# Patient Record
Sex: Male | Born: 1950 | Race: White | Hispanic: No | Smoking: Former smoker
Health system: Southern US, Community
[De-identification: ages and names within clinical notes are randomized; demographics above are authoritative.]

## PROBLEM LIST (undated history)

## (undated) DIAGNOSIS — F319 Bipolar disorder, unspecified: Secondary | ICD-10-CM

## (undated) DIAGNOSIS — I480 Paroxysmal atrial fibrillation: Secondary | ICD-10-CM

## (undated) DIAGNOSIS — Z973 Presence of spectacles and contact lenses: Secondary | ICD-10-CM

## (undated) DIAGNOSIS — H9190 Unspecified hearing loss, unspecified ear: Secondary | ICD-10-CM

## (undated) DIAGNOSIS — E1140 Type 2 diabetes mellitus with diabetic neuropathy, unspecified: Secondary | ICD-10-CM

## (undated) DIAGNOSIS — E119 Type 2 diabetes mellitus without complications: Secondary | ICD-10-CM

## (undated) DIAGNOSIS — E785 Hyperlipidemia, unspecified: Secondary | ICD-10-CM

## (undated) DIAGNOSIS — I4892 Unspecified atrial flutter: Secondary | ICD-10-CM

## (undated) DIAGNOSIS — N400 Enlarged prostate without lower urinary tract symptoms: Secondary | ICD-10-CM

## (undated) DIAGNOSIS — I4891 Unspecified atrial fibrillation: Secondary | ICD-10-CM

## (undated) DIAGNOSIS — I499 Cardiac arrhythmia, unspecified: Secondary | ICD-10-CM

## (undated) DIAGNOSIS — I272 Pulmonary hypertension, unspecified: Secondary | ICD-10-CM

## (undated) DIAGNOSIS — R011 Cardiac murmur, unspecified: Secondary | ICD-10-CM

## (undated) DIAGNOSIS — T8859XA Other complications of anesthesia, initial encounter: Secondary | ICD-10-CM

## (undated) DIAGNOSIS — N289 Disorder of kidney and ureter, unspecified: Secondary | ICD-10-CM

## (undated) DIAGNOSIS — I10 Essential (primary) hypertension: Secondary | ICD-10-CM

## (undated) DIAGNOSIS — M1990 Unspecified osteoarthritis, unspecified site: Secondary | ICD-10-CM

## (undated) DIAGNOSIS — M109 Gout, unspecified: Secondary | ICD-10-CM

## (undated) HISTORY — DX: Type 2 diabetes mellitus with diabetic neuropathy, unspecified: E11.40

## (undated) HISTORY — DX: Type 2 diabetes mellitus without complications: E11.9

## (undated) HISTORY — DX: Cardiac murmur, unspecified: R01.1

## (undated) HISTORY — DX: Disorder of kidney and ureter, unspecified: N28.9

## (undated) HISTORY — DX: Unspecified atrial fibrillation: I48.91

## (undated) HISTORY — PX: CARDIAC ABLATION: SHX51081

## (undated) HISTORY — DX: Unspecified hearing loss, unspecified ear: H91.90

## (undated) HISTORY — PX: APPENDECTOMY (OPEN): SHX54

## (undated) HISTORY — PX: HERNIA REPAIR: SHX51

## (undated) HISTORY — DX: Other complications of anesthesia, initial encounter: T88.59XA

## (undated) HISTORY — DX: Benign prostatic hyperplasia without lower urinary tract symptoms: N40.0

## (undated) HISTORY — DX: Essential (primary) hypertension: I10

## (undated) HISTORY — DX: Unspecified osteoarthritis, unspecified site: M19.90

## (undated) HISTORY — PX: TEE: SHX5571

## (undated) HISTORY — DX: Unspecified atrial flutter: I48.92

## (undated) HISTORY — DX: Bipolar disorder, unspecified: F31.9

## (undated) HISTORY — DX: Paroxysmal atrial fibrillation: I48.0

## (undated) HISTORY — PX: CARDIAC CATHETERIZATION: SHX172

## (undated) HISTORY — DX: Cardiac arrhythmia, unspecified: I49.9

## (undated) HISTORY — DX: Pulmonary hypertension, unspecified: I27.20

## (undated) HISTORY — DX: Hyperlipidemia, unspecified: E78.5

## (undated) HISTORY — PX: TONSILLECTOMY, ADENOIDECTOMY: SHX5620

## (undated) HISTORY — PX: CARDIOVERSION: SHX1299

## (undated) HISTORY — PX: TONSILLECTOMY: SUR1361

## (undated) HISTORY — DX: Presence of spectacles and contact lenses: Z97.3

## (undated) HISTORY — DX: Gout, unspecified: M10.9

---

## 1988-04-06 ENCOUNTER — Ambulatory Visit: Admit: 1988-04-06 | Payer: Self-pay | Source: Ambulatory Visit

## 1988-07-18 ENCOUNTER — Emergency Department: Admit: 1988-07-18 | Payer: Self-pay | Source: Ambulatory Visit

## 1988-10-04 ENCOUNTER — Inpatient Hospital Stay
Admission: AD | Admit: 1988-10-04 | Disposition: A | Discharge status: Home / Self Care | Payer: Self-pay | Source: Ambulatory Visit

## 1988-10-15 ENCOUNTER — Emergency Department: Admit: 1988-10-15 | Payer: Self-pay | Source: Ambulatory Visit

## 2009-08-09 HISTORY — PX: PARACENTESIS: SHX844

## 2015-01-13 ENCOUNTER — Encounter
Admission: RE | Disposition: A | Discharge status: Home / Self Care | Payer: Self-pay | Source: Home / Self Care | Attending: Clinical Cardiac Electrophysiology

## 2015-01-13 ENCOUNTER — Ambulatory Visit
Admission: RE | Admit: 2015-01-13 | Discharge: 2015-01-13 | Disposition: A | Discharge status: Home / Self Care | Payer: Medicare Other | Attending: Clinical Cardiac Electrophysiology | Admitting: Clinical Cardiac Electrophysiology

## 2015-01-13 ENCOUNTER — Ambulatory Visit: Payer: Medicare Other | Admitting: Clinical Cardiac Electrophysiology

## 2015-01-13 DIAGNOSIS — I10 Essential (primary) hypertension: Secondary | ICD-10-CM | POA: Insufficient documentation

## 2015-01-13 DIAGNOSIS — E1140 Type 2 diabetes mellitus with diabetic neuropathy, unspecified: Secondary | ICD-10-CM | POA: Insufficient documentation

## 2015-01-13 DIAGNOSIS — Z7901 Long term (current) use of anticoagulants: Secondary | ICD-10-CM | POA: Insufficient documentation

## 2015-01-13 DIAGNOSIS — Z885 Allergy status to narcotic agent status: Secondary | ICD-10-CM | POA: Insufficient documentation

## 2015-01-13 DIAGNOSIS — Z87891 Personal history of nicotine dependence: Secondary | ICD-10-CM | POA: Insufficient documentation

## 2015-01-13 DIAGNOSIS — I480 Paroxysmal atrial fibrillation: Secondary | ICD-10-CM | POA: Insufficient documentation

## 2015-01-13 DIAGNOSIS — I4892 Unspecified atrial flutter: Secondary | ICD-10-CM | POA: Insufficient documentation

## 2015-01-13 LAB — VH I-STAT CHEM 8 NOTIFICATION

## 2015-01-13 LAB — CBC AND DIFFERENTIAL
Basophils %: 0.9 % (ref 0.0–3.0)
Basophils Absolute: 0.1 10*3/uL (ref 0.0–0.3)
Eosinophils %: 5.1 % (ref 0.0–7.0)
Eosinophils Absolute: 0.3 10*3/uL (ref 0.0–0.8)
Hematocrit: 43.5 % (ref 39.0–52.5)
Hemoglobin: 14.2 gm/dL (ref 13.0–17.5)
Lymphocytes Absolute: 1.3 10*3/uL (ref 0.6–5.1)
Lymphocytes: 18.9 % (ref 15.0–46.0)
MCH: 29 pg (ref 28–35)
MCHC: 33 gm/dL (ref 32–36)
MCV: 89 fL (ref 80–100)
MPV: 7 fL (ref 6.0–10.0)
Monocytes Absolute: 0.9 10*3/uL (ref 0.1–1.7)
Monocytes: 13.8 % (ref 3.0–15.0)
Neutrophils %: 61.3 % (ref 42.0–78.0)
Neutrophils Absolute: 4.1 10*3/uL (ref 1.7–8.6)
PLT CT: 287 10*3/uL (ref 130–440)
RBC: 4.88 10*6/uL (ref 4.00–5.70)
RDW: 14.6 % — ABNORMAL HIGH (ref 11.0–14.0)
WBC: 6.8 10*3/uL (ref 4.0–11.0)

## 2015-01-13 LAB — ECG 12-LEAD
Patient Age: 63 years
Q-T Dispersion: 60 ms
Q-T Interval(Corrected): 503 ms
Q-T Interval: 364 ms
QRS Axis: 26 deg
QRS Duration: 104 ms
T Axis: 51 deg
Ventricular Rate: 115 /min

## 2015-01-13 LAB — I-STAT CHEM 8 CARTRIDGE
Anion Gap I-Stat: 16 (ref 7–16)
BUN I-Stat: 17 mg/dL (ref 6–20)
Calcium Ionized I-Stat: 4.8 mg/dL (ref 4.35–5.10)
Chloride I-Stat: 105 mMol/L (ref 98–112)
Creatinine I-Stat: 1.1 mg/dL (ref 0.90–1.30)
EGFR: >60 mL/min/{1.73_m2}
Glucose I-Stat: 140 mg/dL — ABNORMAL HIGH (ref 70–99)
Hematocrit I-Stat: 43 % (ref 39.0–52.5)
Hemoglobin I-Stat: 14.6 gm/dL (ref 13.0–17.5)
Potassium I-Stat: 4.3 mMol/L (ref 3.5–5.3)
Sodium I-Stat: 141 mMol/L (ref 135–145)
TCO2 I-Stat: 25 mMol/L (ref 24–29)

## 2015-01-13 LAB — VH I-STAT INR NOTIFICATION

## 2015-01-13 LAB — VH I-STAT INR: i-STAT INR: 1.1 (ref 0.0–3.0)

## 2015-01-13 SURGERY — ABLATION - SVT

## 2015-01-13 MED ORDER — VH HEPARIN (PORCINE) IN NACL 2-0.9 UNIT/ML-%IJ SOLN (WRAP)
Status: AC
Start: 2015-01-13 — End: 2015-01-13
  Filled 2015-01-13: qty 500

## 2015-01-13 MED ORDER — ONDANSETRON HCL 4 MG/2ML IJ SOLN
INTRAMUSCULAR | Status: DC
Start: 2015-01-13 — End: 2015-01-13
  Filled 2015-01-13: qty 2

## 2015-01-13 MED ORDER — FENTANYL CITRATE (PF) 50 MCG/ML IJ SOLN (WRAP)
INTRAMUSCULAR | Status: DC
Start: 2015-01-13 — End: 2015-01-13
  Filled 2015-01-13: qty 2

## 2015-01-13 MED ORDER — MIDAZOLAM HCL 2 MG/2ML IJ SOLN
INTRAMUSCULAR | Status: DC
Start: 2015-01-13 — End: 2015-01-13
  Filled 2015-01-13: qty 2

## 2015-01-13 MED ORDER — SODIUM CHLORIDE 0.9 % IV SOLN
1.0000 ug/min | Freq: Once | INTRAVENOUS | Status: DC
Start: 2015-01-13 — End: 2015-01-13
  Filled 2015-01-13: qty 5

## 2015-01-13 MED ORDER — FENTANYL CITRATE (PF) 50 MCG/ML IJ SOLN (WRAP)
INTRAMUSCULAR | Status: AC
Start: 2015-01-13 — End: 2015-01-13
  Administered 2015-01-13 (×2): 50 ug via INTRAVENOUS
  Filled 2015-01-13: qty 2

## 2015-01-13 MED ORDER — LIDOCAINE HCL (CARDIAC) 20 MG/ML IV SOLN
INTRAVENOUS | Status: AC
Start: 2015-01-13 — End: 2015-01-13
  Administered 2015-01-13: 20 mL via SUBCUTANEOUS
  Filled 2015-01-13: qty 10

## 2015-01-13 MED ORDER — MIDAZOLAM HCL 2 MG/2ML IJ SOLN
INTRAMUSCULAR | Status: AC
Start: 2015-01-13 — End: 2015-01-13
  Administered 2015-01-13 (×2): 1 mg via INTRAVENOUS
  Filled 2015-01-13: qty 2

## 2015-01-13 MED ORDER — MIDAZOLAM HCL 2 MG/2ML IJ SOLN
INTRAMUSCULAR | Status: AC
Start: 2015-01-13 — End: 2015-01-13
  Administered 2015-01-13: 2 mg via INTRAVENOUS
  Filled 2015-01-13: qty 2

## 2015-01-13 NOTE — Progress Notes (Signed)
Ate lunch and tolerated well. Ambulated. Voided. Groin remains stable.

## 2015-01-13 NOTE — Progress Notes (Signed)
Discharge inst given regarding medications, groin care and follow up. Pt verbalized understanding. Discharged home accompanied by friend.

## 2015-01-13 NOTE — Discharge Instructions (Signed)
EP Study/Ablation: Bleeding Or Hematoma After   You recently had an EP study and atrial flutter ablation. A catheter was put into your body through a puncture site in your groin To prevent repeat bleeding, take some precautions at home. If the bleeding starts again, follow the advice below.    Home Care  For the rest of the day:  Drink additional fluids unless told otherwise  Keep dressing dry and intact.  Do not shower.  Do not drive.    For The Next 72 Hours:   May remove dressing the day following your procedure. Do not apply any powders, lotions or ointments and keep bandage off.  Do not do any strenuous activity including lifting, pushing or pulling anything greater than 10 lbs.  You may shower but do NOT sit in tub water, hot tub or pool of water.  You may have some flat bruising and/or mild soreness at the puncture site--this is normal.  Notify your physician if you experience chills or fever greater than 101.    If Bleeding Occurs:  Lie on your back.  Apply firm pressure at the site with your hand.  Call 911.    Get Prompt Medical Attention  if any of the following occurs:  Increased pain, redness, swelling, or drainage from the puncture site  Coolness, numbness, tingling, or skin color changes in the leg or arm that had the puncture site  Chest pain or pressure  Nausea or vomiting  Feeling weak or faint  Hematoma (lump) is constantly or quickly getting larger   [address] [place] [address]. All rights reserved. This information is not intended as a substitute for professional medical care. Always follow your healthcare professional's instructions.

## 2015-01-13 NOTE — Progress Notes (Signed)
Awake. Groin remains stable.

## 2015-01-13 NOTE — Progress Notes (Signed)
Returned from EP lab s/p atrial flutter ablation. Drowsy but awakens easily. Alert and oriented. Right groin stable. Activity restrictions given.

## 2015-01-13 NOTE — Discharge Summary (Signed)
Patient with persistent flutter underwent successful flutter ablation. No complications. Will d/c atenolol. Continue Sotalol and Eliquis. F/u in few weeks.

## 2016-03-05 DIAGNOSIS — I517 Cardiomegaly: Secondary | ICD-10-CM

## 2016-03-05 DIAGNOSIS — I509 Heart failure, unspecified: Secondary | ICD-10-CM

## 2016-03-16 ENCOUNTER — Ambulatory Visit
Admission: RE | Admit: 2016-03-16 | Discharge: 2016-03-16 | Disposition: A | Discharge status: Home / Self Care | Payer: Medicare Other | Source: Ambulatory Visit

## 2016-03-16 DIAGNOSIS — I509 Heart failure, unspecified: Secondary | ICD-10-CM

## 2016-03-16 DIAGNOSIS — I350 Nonrheumatic aortic (valve) stenosis: Secondary | ICD-10-CM

## 2016-03-16 DIAGNOSIS — I517 Cardiomegaly: Secondary | ICD-10-CM | POA: Insufficient documentation

## 2016-03-16 LAB — I-STAT CREATININE
Creatinine I-Stat: 1.3 mg/dL (ref 0.90–1.30)
EGFR: 57 mL/min/{1.73_m2} — ABNORMAL LOW (ref 60–150)

## 2016-03-16 MED ORDER — GADOBUTROL 1 MMOL/ML IV SOLN
15.0000 mL | Freq: Once | INTRAVENOUS | Status: AC | PRN
Start: 2016-03-16 — End: 2016-03-16
  Administered 2016-03-16: 15 mmol via INTRAVENOUS

## 2016-05-20 ENCOUNTER — Encounter: Payer: Self-pay | Admitting: Internal Medicine

## 2016-05-26 ENCOUNTER — Other Ambulatory Visit (INDEPENDENT_AMBULATORY_CARE_PROVIDER_SITE_OTHER): Payer: Self-pay | Admitting: Internal Medicine

## 2016-05-26 DIAGNOSIS — I4891 Unspecified atrial fibrillation: Secondary | ICD-10-CM

## 2016-06-04 ENCOUNTER — Ambulatory Visit
Admission: RE | Admit: 2016-06-04 | Discharge: 2016-06-04 | Disposition: A | Discharge status: Home / Self Care | Payer: Medicare Other | Source: Ambulatory Visit | Attending: Internal Medicine | Admitting: Internal Medicine

## 2016-06-04 DIAGNOSIS — I4891 Unspecified atrial fibrillation: Secondary | ICD-10-CM | POA: Insufficient documentation

## 2016-06-04 LAB — POTASSIUM: Potassium: 4 mMol/L (ref 3.5–5.3)

## 2016-06-04 LAB — MAGNESIUM: Magnesium: 2.2 mg/dL (ref 1.6–2.6)

## 2016-06-04 MED ORDER — BENZOCAINE 20% MT SOLN (WRAP)
OROMUCOSAL | Status: AC
Start: 2016-06-04 — End: ?
  Filled 2016-06-04: qty 0.28

## 2016-06-04 MED ORDER — BENZOCAINE 20% MT SOLN (WRAP)
OROMUCOSAL | Status: AC | PRN
Start: 2016-06-04 — End: 2016-06-04
  Administered 2016-06-04: 1 via OROMUCOSAL

## 2016-06-04 MED ORDER — MIDAZOLAM HCL 2 MG/2ML IJ SOLN
INTRAMUSCULAR | Status: AC | PRN
Start: 2016-06-04 — End: 2016-06-04
  Administered 2016-06-04: 1 mg via INTRAVENOUS

## 2016-06-04 MED ORDER — LIDOCAINE VISCOUS 2 % MT SOLN
OROMUCOSAL | Status: AC
Start: 2016-06-04 — End: ?
  Filled 2016-06-04: qty 15

## 2016-06-04 MED ORDER — MIDAZOLAM HCL 2 MG/2ML IJ SOLN
INTRAMUSCULAR | Status: AC
Start: 2016-06-04 — End: ?
  Filled 2016-06-04: qty 4

## 2016-06-04 MED ORDER — VH FENTANYL CITRATE 100 MCG/2 ML (NARRATOR)
INTRAMUSCULAR | Status: AC | PRN
Start: 2016-06-04 — End: 2016-06-04
  Administered 2016-06-04: 50 ug via INTRAVENOUS

## 2016-06-04 MED ORDER — MIDAZOLAM HCL 2 MG/2ML IJ SOLN
INTRAMUSCULAR | Status: AC | PRN
Start: 2016-06-04 — End: 2016-06-04
  Administered 2016-06-04: 2 mg via INTRAVENOUS

## 2016-06-04 MED ORDER — FENTANYL CITRATE (PF) 50 MCG/ML IJ SOLN (WRAP)
INTRAMUSCULAR | Status: AC
Start: 2016-06-04 — End: ?
  Filled 2016-06-04: qty 2

## 2016-06-04 NOTE — Sedation Documentation (Signed)
Family updated as to patient's status.

## 2016-06-04 NOTE — Sedation Documentation (Signed)
200 Joule BP SYNC CV, SR

## 2016-06-04 NOTE — Discharge Instructions (Signed)
DO NOT DRIVE OR OPERATE DANGEROUS EQUIPMENT BEFORE TOMORROW.   DO NOT ENGAGE IN ANY CONTRACTS BEFORE TOMORROW.  SAME HOME MEDICATIONS EXCEPT DISCONTINUE ATENOLOL PER DR. Jaquita Rector.

## 2016-06-04 NOTE — Sedation Documentation (Signed)
TEE END.

## 2016-06-04 NOTE — Sedation Documentation (Signed)
Reviewed procedures with patient, he says he understands. Negative STOP BANG.

## 2016-06-04 NOTE — Sedation Documentation (Signed)
Patient experienced no respiratory distress during procedure.

## 2016-06-04 NOTE — Sedation Documentation (Signed)
Patient numbed pre TEE with Viscous Lidocaine and Hurricaine One.

## 2016-06-04 NOTE — Sedation Documentation (Signed)
Patient tolerated procedure well.

## 2016-06-04 NOTE — Procedures (Signed)
Direct Current Cardioversion  Procedure Note    Patient Name: ABHIRAM CRIADO    Date of Birth: 11-12-1950    Referring Physician:  Dr [name]     Indication: Atrial Fibrillation     TEE done prior : no thrombus seen , + PFO + bicuspid aortic valve     Pre-procedural assessment:   ASA :2  Mallampati: 2    Moderate conscious Sedation:  Moderate conscious sedation was performed. A preprocedural assessment of the patient was completed including review of past medical/surgical history,previous anesthesia sedation experience and family history. Patient's medications and drug allergies were reviewed. Patient examined with vital signs reviewed.  Incremental amounts of medications given to attain moderate level of consciousness.There was continuous face-to-face attendance by myself with trained nurse  monitoring patient's consciousness and physiologic status throughout the procedure.     My total intraservice face-to-face time for moderate sedation:15 Minutes.    Medications: Patient anesthetized with Versed 4  mg, Fentanyl  100 mcg.    Procedure:  Informed consent is obtained. A 200 joule anterior posterior synchronized biphasic shock delivered with successful cardioversion to sinus rhythm.    Complications: None    Pete Glatter, MD

## 2016-06-07 LAB — ECG 12-LEAD
P Wave Axis: 93 deg
P-R Interval: 203 ms
Patient Age: 64 years
Q-T Interval(Corrected): 491 ms
Q-T Interval: 468 ms
QRS Axis: 57 deg
QRS Duration: 93 ms
T Axis: 103 years
Ventricular Rate: 66 //min

## 2016-06-17 ENCOUNTER — Encounter: Payer: Self-pay | Admitting: Medical

## 2016-06-23 ENCOUNTER — Encounter: Payer: Self-pay | Admitting: Medical

## 2016-08-27 ENCOUNTER — Telehealth: Payer: Self-pay

## 2016-08-27 NOTE — Telephone Encounter (Signed)
Pt called in and stated that he has been in constant AFIB for some time. He states that over the past week it has gotten worse, HR around 100bpm. Pt has an upcoming appt 2/9 but would like something sooner with Dr. [name]. I offered 1/25 with Sharol Roussel PAC but pt declined. He asked that I send a message. The only sxs he has c/o is DOE. He will go to ER if sxs persist or worsen. Pt would like to know if Dr. [name] can work him in sooner    Please advise    Thanks  Ahill LPN

## 2016-08-30 NOTE — Telephone Encounter (Signed)
Tried to call pt, no answer, VM left stating that 2/9 is the soonest    Ahill LPN

## 2016-08-30 NOTE — Telephone Encounter (Signed)
He is overbooked his best option is 2/9 with Lb, please just re-encouraged this is the soonest option and ADV encouraged him to go see her.   Thanks   Janett Billow

## 2016-09-21 ENCOUNTER — Telehealth: Payer: Self-pay

## 2016-09-21 NOTE — Progress Notes (Signed)
.D: 06/23/16 : 11:35am  .T: Cardiology Follow-up    [facility]  [address], [place], [place]  Phone: [phone] * Fax: [phone]  [email or web address]    Patient: [name]  Date of Birth: June 15, 2051  Gender:  male   Cardiologist: Dr. Bronson [name]  Primary Care Provider:  Vergie [name]  Patient Care was overseen, and co-signed, by the following:  .CC: ADV  [name] Curb, DO   CC:   Dr. Jerene [name]    Chief Complaint/Reason for Consult: Establish Care/AFib     Medical History:     PAST MEDICAL HISTORY:    1.  Atrial fibrillation, new onset, 10/21/11, cardioverted and placed on sotalol.      a.  Cardioversion 10/22/11.       b.  Echo 10/22/11- moderate-severe concentric LVH. Biatrial enlargement. Mild AS, dilated inferior vena cava, small pericardial effusion. Peak velocity across the AV at 2.2  meters/s.      c.  Cardiac abltation 01/13/15.       d.  MRI Cardiac 03/16/16- LVEF 68.4%. Moderate septal wall hypertrophy (1.6cm) and mild posterior wall hypertrophy (1.3cm). Focal area of mid-myocardial wall delayed enhancement in basal anteroseptal wall and RV insertion point consistent with hypertensive heart disease. RVEF 41.7%. Severe LA enlargement and moderate RA enlargement. 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 Mild AS with peak velocity 2.49m/s. Mild central AR. Mild MR and TR. Small to moderate pericardial effusion. Mildly dilated ascending aorta. Aortic arch and descending thoracic aorta are ectatic. Trivial right pleural effusion. Dilated central pulm artery at 4.3cm.        e.  s/p TEE and [place] cardioversion 06/04/16    2.  Hypertension.  3.  Hyperlipidemia.   4.  Type I Diabetes Mellitus.  5.  Bipolar disorder.  6.  Fatty liver.  7.  Renal insufficiency.      a.  Normal renal ultrasound October 23, 2011.    Past Surgical History:   1.  Adenoidectomy.   2.  Appendectomy.   3.   Hernia Repair.   4.  Tonsillectomy.     LABS: 03/16/16- Creat 1.3, GFR 57    Most Recent Ejection Fraction: 70 % by Echo on  02/25/2016     .end  History of Present Illness:      66 year old gentleman here for follow up of his atrial fibrillation.  He was seen in consultation by Dr. Sheppard [name] October 12th.  H/O of atrial fibrillation dating back to 2013.  He was in atrial fibrillation at the time of his last visit.  Dr. Sheppard [name] started him on amiodarone and arranged for TEE and cardioversion.  Cardioversion was initially successful.  This was performed on October 27th.  By November 2nd he felt that he went back into AFib.  On 11/3 he went back into sinus rhythm which continued until November 6th.  unfortunately, went back into AFib after that and it has been persistent since.  Also note that his atenolol was discontinued at the time of his cardioversion.  Within a few days, he noted increased palpitations/ PVCs /elevated blood pressures, so he took it upon himself to restart it.    Review of Systems: As per HPI, all other systems negative.  See Cardio Intake form, that has been scanned into the chart.    Allergies:   Allergies:  CODEINE  Allergy history last updated on 05/20/16  Allergies Reviewed  .ALR    Current Medications:     Current Medications:  Rx: METFORMIN HCL 1000MG  1 Tablet twice daily   Rx: MINOXIDIL 10mg  3 Tablet daily   Rx: DEPAKOTE 250MG  2 Tablet twice daily   Rx: ALLOPURINOL 300MG  1 Tablet daily   Rx: DILTIAZEM HCL ER BEADS 240MG  1 Capsule ER 24HR daily   Rx: FLOMAX 0.4MG  1 Capsule daily   Rx: GABAPENTIN 100MG  1 Capsule twice daily   Rx: GLIMEPIRIDE 1MG  0.5 Tablet daily   Rx: LASIX 80MG  1 Tablet daily   Rx: PRAVASTATIN SODIUM 40MG  1 Tablet daily   Rx: XARELTO 20MG  1 Tablet daily   Rx: ULTRAM 50MG  1 Tablet twice daily PRN     Atenolol 100 mg daily.    Patient presented Rx by: Bottles/List  nd    Family History (FHx)     Family History:     Mother      Hypertensive disorder      Diabetes 2  Sister       Heart disease      Diabetes  Brother      Pulmonary embolism      Hypertensive disorder      Heart disease    .end  Social History (SHx)     Alcohol Consumption: None   Caffeine Intake: 1 cup coffee daily  Exercise: Walking/Daily  z.end    Smoking Status is recorded today as Former smoker     Physical Exam:  Vital Signs:  Bp: 134/84, Left Arm, Pulse: 60, Irregular  Height: 5'7", Weight: 182 lbs    BMI: 28.52 kg/m2  BMI: BMI of 28.52 kg/m2 reflects normal weight for age 10 and above.  No BMI plan necessary.      General:  Well appearing, well nourished in no distress.     Head:  normocephalic, atraumatic     Lungs:  CTA bilaterally, no wheezes, rhonchi, rales.  Breathing unlabored.    Neck: Supple,   Heart:   Irregularly irregular with a 2/6 systolic ejection murmur .  controlled rate.    Extremities:  No deformities, clubbing, cyanosis, or edema.   Skin:  no rash or prominent lesions    Neurologic:  A/O x 3.  Gait Normal   Psychiatric:  Normal affect.     EKG: (personally reviewed by me)   atrial fibrillation, rate 87 beats per min.    Impressions/Recommendations:   1.   Recurrent, persistent atrial fibrillation.  Controlled rate.  He is still on amiodarone 200 mg daily and atenolol and diltizem for rate control.  Dr. Sheppard [name] was concerned that atrial fib ablation would not likely be successful given the presence of atrial fibrillation.  At this point,  since he has been going in and out of the atrial fibrillation, will hold tight and see if he does convert again.  unclear for would be worthwhile to cardiovert him again or increase the amiodarone.  I will discuss further with Dr. Sheppard [name] and get his input.  In the meantime, Mr. [name] will keep his f/u as scheduled with Dr. Sheppard [name] in Feb.  and contcat [place] in the interim with any changes.    The patient was given the following instructions during the current visit:  Please take your medications as instructed and call if you have any side effects. Don't  forget to bring your meds to the next visit.        #        SIGNED BY  LISA BEHNEKE, PA-C  ([id number])       06/23/2016 11:36AM  #       REVISED BY Sharol Roussel, PA-C  (BL3)       06/23/2016 06:12PM  #      REVIEWED BY [name], DO  (ADV)       06/28/2016 06:48AM

## 2016-09-21 NOTE — Telephone Encounter (Signed)
PCP requested a fax for request of records recent office note and labs, Requested.

## 2016-09-21 NOTE — Progress Notes (Signed)
.D: 05/20/16 : 11:16am  .T: Cardiology     [facility]  [address], [place], [place]  Phone: [phone] * Fax: [phone]  [email or web address]    Patient: [name]  Date of Birth: Feb 06, 2051  Gender:  male   Cardiologist: Magdalene Molly  Primary Care Provider:  Vergie Living    Chief Complaint/Reason for Consult: Establish Care/AFib     Medical History:     PAST MEDICAL HISTORY:    1.  Atrial fibrillation, new onset, 10/21/11, cardioverted and placed on sotalol.      a.  Cardioversion 10/22/11.       b.  Echo 10/22/11- moderate-severe concentric LVH. Biatrial enlargement. Mild AS, dilated inferior vena cava, small pericardial effusion. Peak velocity across the AV at 2.2  meters/s.      c.  Cardiac abltation 01/13/15.       d.  MRI Cardiac 03/16/16- LVEF 68.4%. Moderate septal wall hypertrophy (1.6cm) and mild posterior wall hypertrophy (1.3cm). Focal area of mid-myocardial wall delayed enhancement in basal anteroseptal wall and RV insertion point consistent with hypertensive heart disease. RVEF 41.7%. Severe LA enlargement and moderate RA enlargement. 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 Mild AS with peak velocity 2.69m/s. Mild central AR. Mild MR and TR. Small to moderate pericardial effusion. Mildly dilated ascending aorta. Aortic arch and descending thoracic aorta are ectatic. Trivial right pleural effusion. Dilated central pulm artery at 4.3cm.     2.  Hypertension.  3.  Hyperlipidemia.   4.  Type I Diabetes Mellitus.  5.  Bipolar disorder.  6.  Fatty liver.  7.  Renal insufficiency.      a.  Normal renal ultrasound October 23, 2011.    Past Surgical History:   1.  Adenoidectomy.   2.  Appendectomy.   3.  Hernia Repair.   4.  Tonsillectomy.     LABS: 03/16/16- Creat 1.3, GFR 57    Most Recent Ejection Fraction: 70 % by Echo on  02/25/2016     .end  History of Present  Illness:    Mr. Haberle is referred by Dr. Jerene Pitch for consideration to advanced AF ablation options. He is a pleasant 66 y/o male with a history of recurrent atrial fibrillation. He underwent atrial flutter ablation by Dr. Humphrey Rolls in June 2016. He had recurrent atrial fibrillation post ablation, and he is in AF today with controlled ventricular response.     He had previously seen Dr. Humphrey Rolls, but was last seen in November last year.     There was a concern for hypertrophic cardiomyopathy, cMRI was not conclusive for HCM, he has severe LVH, and severe LA enlargement.     Review of Systems: As per HPI, all other systems negative.    Allergies:   Allergies:  CODEINE  Allergy history last updated on 05/20/16    Current Medications:     Current Medications:  Rx: METFORMIN HCL 1000MG  1 Tablet twice daily   Rx: MINOXIDIL 10mg  3 Tablet daily   Rx: DEPAKOTE 250MG  2 Tablet twice daily       Current Medications:  Rx: METFORMIN HCL 1000MG  1 Tablet twice daily    Rx: MINOXIDIL 10mg  3 Tablet daily    Rx: DEPAKOTE 250MG  2 Tablet twice daily    Rx: ALLOPURINOL 300MG  1 Tablet daily    Rx: DILTIAZEM HCL ER BEADS 240MG  1 Capsule ER 24HR daily  Rx: LASIX 80MG  1 Tablet daily     He tells me that he takes rivaroxaban 20mg  with dinner at nighttime. He is also taking diltiazem 120mg  daily.     Patient presented Rx by: Bottles/List  nd    Family History (FHx)     .end  Social History (SHx)     Alcohol Consumption: None   Caffeine Intake: 1 cup coffee daily  Exercise: Walking/Daily  z.end    Smoking Status is recorded today as Former smoker     Physical Exam:  Vital Signs:  Bp: 134/84, Left Arm, Pulse: 60, Irregular  Height: 5'7", Weight: 182 lbs    BMI: 28.52 kg/m2  BMI: BMI of 28.52 kg/m2 reflects normal weight for age 18 and above.  No BMI plan necessary.      General:  Well appearing, well nourished in no distress.     Head:  normocephalic, atraumatic     Lungs:  CTA bilaterally, no wheezes, rhonchi, rales.  Breathing unlabored.    Neck:  Supple, no  carotid bruit auscultated.   Lymph:  without adenopathy  Heart:   irregularly irregular, soft systolic murmur at left upper sternal border, crescendo-decrescendo murmur at cardiac apex.   Chest wall:  No swelling, ecchymosis, erythema or deformity. Symmetrical, no use of accessory muscles.   Abdomen:  Soft, NT/ND, no HSM, no masses.   Skin:  no rash or prominent lesions    Neurologic:  A/O x 3.  No focal deficits     atrial fibrillation.     Impressions/Recommendations:     1. Persistent atrial fibrillation (potentially long standing persistent). He discontinued sotalol at the behest of one of his physicians. At this point, direct intervention for atrial fibrillation with ablation will likely not be successful, given the persistence of his atrial fibrillation. I would like to given him a trial of amiodarone for three weeks, attempt cardioversion, then consider catheter ablation in the future. He is currently on rivaroxaban 20mg , given his prior history of medical non-compliance, I would recommend TEE prior to cardioversion.     2. Severe LVH, severe left atrial enlargement. Preserved LV function.     3. I will have him f/u with Lattie Haw B in 6 weeks, post cardioversion, I will see him back in 4 months.     4. We will order PFTs in 6-8 weeks.             #Orders: CARDIOVERSION [Do in Routine days]  #Orders: TEE [Do in Routine days]  #Orders: PFT[facility]S [facility] [Do in Routine days]      Rx: AMIODARONE HCL 200MG  1 daily -, 90, Ref: 1          [facility] order - additional information    Date of Test: 05/20/2016  Complete PFT with DLCO: Yes  ABG Arterial Blood Gas: No  On Room Air: No  On O2:   6 minute walk: No  Without O2: No  With O2: No        #        SIGNED BY DANIEL V ALEXANDER, DO (ADV)       05/20/2016 11:34A

## 2016-09-23 NOTE — Telephone Encounter (Signed)
Some new Cardio notes and labs in [facility] -   Will call pcp to request LOV/BW

## 2016-09-23 NOTE — Telephone Encounter (Signed)
CALLED PCP - LMOVM FOR LOV/BW

## 2016-09-27 ENCOUNTER — Encounter: Payer: Self-pay | Admitting: Internal Medicine

## 2016-09-27 ENCOUNTER — Ambulatory Visit: Payer: Medicare Other | Admitting: Internal Medicine

## 2016-09-27 VITALS — BP 168/120 | HR 73 | Ht 67.0 in | Wt 189.2 lb

## 2016-09-27 DIAGNOSIS — I4819 Other persistent atrial fibrillation: Secondary | ICD-10-CM

## 2016-09-27 NOTE — Progress Notes (Signed)
Cardiology Follow-Up      [name]   Date of Birth: 05-03-1951    Provider: [name], DO     Patient Care Team:  [name] Delphia Grates, MD as PCP - General (Family Medicine)  [name], DO as Consulting Physician (Cardiology)    Chief Complaint: Atrial Fibrillation (3 month follow up)      History of Present Illness   [name] is a 66 y.o. male being seen today for atrial fibrillation.      I had started him on amiodarone in an attempt to convert to SR. He maintained SR for a month, then devolved into atrial fibrillation. He remains in AF at this time. He denies chest pain or shortness of breath.     He is here to review his options for atrial fibrillation. He may be a good candidate for Convergent approach to AF given his left atrial size, persistence of AF, and failure of amiodarone. Today we discussed the possibility of hybrid ablation.       Review of Systems   Constitution: Negative for activity change, appetite change, fatigue, fever, and unexpected weight change  HENT: Negative for trouble swallowing, and voice change  Eyes: Negative for any visual disturbances  Respiratory: Negative for apnea and/or awakening with sob, chest tightness, and sob  Cardiovascular: Negative for chest pain/discomfort, leg or ankle swelling, difficulty breathing lying down, and palpitations  Gastrointestinal: Negative for abdominal pain, blood in stool, nausea, and vomiting  Endocrine: Negative for cold intolerance, heat intolerance, and polydipsia  GU: Negative for flank pain, frequent urination, and hematuria  Musculoskeletal: Negative for gait problems, joint swelling, neck pain, and pain or heaviness in legs  Skin: Negative for rash, wound, and ulcers on legs/feet  Neurological: Negative for dizziness, headaches, and syncope  Hematologic: Negative for bruises  Psychiatric: Negative for anxiety, and sleep disturbance    Comprehensive review of systems performed by me. Unless  otherwise noted, all systems are negative.    Past Medical History     PAST MEDICAL HISTORY: 09/27/16 ADV     1.Atrial fibrillation, new onset, 10/21/11, cardioverted and placed on sotalol.   a. Cardioversion 10/22/11.    b. Echo 10/22/11- moderate-severe concentric LVH.Biatrial enlargement.Mild AS, dilated inferior vena cava, small pericardial effusion.Peak velocity across the AV at 2.2 meters/s.   c. Cardiac abltation 01/13/15.    d. MRI Cardiac 03/16/16- LVEF 68.4%. Moderate septal wall hypertrophy (1.6cm) and mild posterior wall hypertrophy (1.3cm). Focal area of mid-myocardial wall delayed enhancement in basal anteroseptal wall and RV insertion point consistent with hypertensive heart disease. RVEF 41.7%. Severe LA enlargement and moderate RA enlargement. 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 Mild AS with peak velocity 2.66m/s. Mild central AR. Mild MR and TR. Small to moderate pericardial effusion. Mildly dilated ascending aorta. Aortic arch and descending thoracic aorta are ectatic. Trivial right pleural effusion. Dilated central pulm artery at 4.3cm.    e. s/p TEE and [place] cardioversion 06/04/16    2.Hypertension.  3. Hyperlipidemia.   4.Type I Diabetes Mellitus.  5.Bipolar disorder.  6.Fatty liver.  7.Renal insufficiency.   a. Normal renal ultrasound October 23, 2011.    Past Surgical History     Past Surgical History:   Procedure Laterality Date   . ADENOIDECTOMY     . APPENDECTOMY     . HERNIA REPAIR     . TONSILLECTOMY  Family History     Family History   Problem Relation Age of Onset   . Hypertension Mother    . Diabetes Mother    . Heart disease Mother    . Hypertension Sister    . Diabetes Sister    . Hyperlipidemia Sister    . Hypertension Brother    . Heart disease Brother    . Hyperlipidemia Brother    . Hypertension Brother    . Diabetes Brother    . Hyperlipidemia Brother    . Hypertension Daughter         Social History     Social History   Substance Use Topics   . Smoking status: Former Smoker     Years: 40.00   . Smokeless tobacco: Never Used      Comment: 1 pack would last a week   . Alcohol use Yes      Comment: socially       Allergies     Allergies   Allergen Reactions   . Codeine        Medications     Current Outpatient Prescriptions   Medication Sig   . allopurinol (ZYLOPRIM) 300 MG tablet Take 300 mg by mouth daily.   Marland Kitchen amiodarone (PACERONE) 200 MG tablet Take 200 mg by mouth daily.   Marland Kitchen atenolol (TENORMIN) 100 MG tablet Take 100 mg by mouth 2 (two) times daily.       . dilTIAZem (CARTIA XT) 240 MG 24 hr capsule Take 240 mg by mouth daily.   . divalproex EC/DR (DEPAKOTE EC/DR) 500 MG EC tablet Take 500 mg by mouth 2 (two) times daily.   . furosemide (LASIX) 40 MG tablet Take 40 mg by mouth daily.       . furosemide (LASIX) 80 MG tablet Take 80 mg by mouth daily.       Marland Kitchen gabapentin (NEURONTIN) 100 MG capsule Take 100 mg by mouth 2 (two) times daily.   Marland Kitchen glimepiride (AMARYL) 1 MG tablet Take 1 mg by mouth every morning before breakfast.   . lisinopril (PRINIVIL,ZESTRIL) 10 MG tablet Take 10 mg by mouth daily.       Marland Kitchen loperamide (IMODIUM) 2 MG capsule Take 2 mg by mouth 2 (two) times daily as needed.       . metFORMIN (GLUCOPHAGE) 1000 MG tablet Take 1,000 mg by mouth 2 (two) times daily with meals.   . minoxidil (LONITEN) 10 MG tablet Take 1 mg by mouth daily.   . Multiple Vitamin (MULTIVITAMIN) capsule Take 1 capsule by mouth daily.   . pravastatin (PRAVACHOL) 40 MG tablet Take 40 mg by mouth every evening.   . rivaroxaban (XARELTO) 20 MG Tab Take 20 mg by mouth daily with dinner.   . tamsulosin (FLOMAX) 0.4 MG Cap Take 0.4 mg by mouth daily.       Physical Exam     Visit Vitals  BP (!) 168/120 (BP Site: Right arm)   Pulse 73   Ht 1.702 m (5\' 7" )   Wt 85.8 kg (189 lb 3.2 oz)   BMI 29.63 kg/m     Wt Readings from Last 3 Encounters:   09/27/16 85.8 kg (189 lb 3.2 oz)   01/13/15 86.4 kg (190 lb 7.6 oz)         Constitutional -  Well appearing, and in no distress  Respiratory - Clear to auscultation bilaterally, normal respiratory effort    Cardiovascular system -    Irregularly irregular.  Normal S1, S2    Soft systolic murmur, ,no rubs, gallops   Jugular venous pulse is normal        Carotid upstroke normal, no carotid bruits auscultated   2+ pulses in the posterior tibial / dorsalis pedis bilaterally    Neurological - Alert, oriented, no focal neurological deficits  Extremities -  No clubbing or cyanosis. No peripheral edema  Skin - Warm and dry, no rashes  Psych- Appropriate affect    Labs     Lab Results   Component Value Date/Time    WBC 6.8 01/13/2015 07:34 AM    RBC 4.88 01/13/2015 07:34 AM    HGB 14.2 01/13/2015 07:34 AM    HCT 43.5 01/13/2015 07:34 AM    PLT 287 01/13/2015 07:34 AM       Lab Results   Component Value Date/Time    K 4.0 06/04/2016 08:45 AM    CREAT 1.30 03/16/2016 11:52 AM       No results found for: CHOL, TRIG, HDL, LDL    No results found for: [id number]    Cardiogenics:     EKG: atrial fibrillation, controlled ventricular response.     Impression and Recommendations:     1. Persistent atrial fibrillation        1. Today we discussed a hybrid approach for AF. He is willing to proceed, and I will refer him to [name] for consideration to the Convergent procedure.   2. I want to thank Dr. Jerene Pitch for this interesting referral.   3. Hypertension - he tells me that his blood pressure is usually not this elevated. He is to monitor this at home.       [name], DO  09/27/2016

## 2016-09-28 ENCOUNTER — Telehealth: Payer: Self-pay

## 2016-09-28 NOTE — Telephone Encounter (Signed)
Referral sent to cardiothoracic surgery. [email or web address]

## 2016-10-05 ENCOUNTER — Ambulatory Visit: Payer: Medicare Other | Admitting: Specialist

## 2016-10-07 ENCOUNTER — Ambulatory Visit: Payer: Medicare Other | Attending: Internal Medicine | Admitting: Specialist

## 2016-10-07 ENCOUNTER — Encounter: Payer: Self-pay | Admitting: Specialist

## 2016-10-07 ENCOUNTER — Ambulatory Visit
Admission: RE | Admit: 2016-10-07 | Discharge: 2016-10-07 | Disposition: A | Discharge status: Home / Self Care | Payer: Medicare Other | Source: Ambulatory Visit | Attending: Nurse Practitioner | Admitting: Nurse Practitioner

## 2016-10-07 DIAGNOSIS — I4819 Other persistent atrial fibrillation: Secondary | ICD-10-CM

## 2016-10-07 DIAGNOSIS — I509 Heart failure, unspecified: Secondary | ICD-10-CM

## 2016-10-07 DIAGNOSIS — K760 Fatty (change of) liver, not elsewhere classified: Secondary | ICD-10-CM | POA: Insufficient documentation

## 2016-10-07 DIAGNOSIS — I481 Persistent atrial fibrillation: Secondary | ICD-10-CM

## 2016-10-07 LAB — COMPREHENSIVE METABOLIC PANEL
ALT: 21 U/L (ref 0–55)
AST (SGOT): 24 U/L (ref 10–42)
Albumin/Globulin Ratio: 1.57 Ratio — ABNORMAL HIGH (ref 0.70–1.50)
Albumin: 4.4 gm/dL (ref 3.5–5.0)
Alkaline Phosphatase: 78 U/L (ref 40–145)
Anion Gap: 14.9 mMol/L (ref 7.0–18.0)
BUN / Creatinine Ratio: 22.5 Ratio (ref 10.0–30.0)
BUN: 48 mg/dL — ABNORMAL HIGH (ref 7–22)
Bilirubin, Total: 0.6 mg/dL (ref 0.1–1.2)
CO2: 27 mMol/L (ref 20.0–30.0)
Calcium: 9.3 mg/dL (ref 8.5–10.5)
Chloride: 106 mMol/L (ref 98–110)
Creatinine: 2.13 mg/dL — ABNORMAL HIGH (ref 0.80–1.30)
EGFR: 31 mL/min/{1.73_m2} — ABNORMAL LOW (ref 60–150)
Globulin: 2.8 gm/dL (ref 2.0–4.0)
Glucose: 83 mg/dL (ref 70–99)
Osmolality Calc: 299 mOsm/kg (ref 275–300)
Potassium: 3.9 mMol/L (ref 3.5–5.3)
Protein, Total: 7.2 gm/dL (ref 6.0–8.3)
Sodium: 144 mMol/L (ref 136–147)

## 2016-10-07 LAB — CBC AND DIFFERENTIAL
Basophils %: 0.9 % (ref 0.0–3.0)
Basophils Absolute: 0.1 10*3/uL (ref 0.0–0.3)
Eosinophils %: 2.8 % (ref 0.0–7.0)
Eosinophils Absolute: 0.3 10*3/uL (ref 0.0–0.8)
Hematocrit: 40.8 % (ref 39.0–52.5)
Hemoglobin: 13.3 gm/dL (ref 13.0–17.5)
Lymphocytes Absolute: 1 10*3/uL (ref 0.6–5.1)
Lymphocytes: 10.3 % — ABNORMAL LOW (ref 15.0–46.0)
MCH: 30 pg (ref 28–35)
MCHC: 33 gm/dL (ref 32–36)
MCV: 92 fL (ref 80–100)
MPV: 7 fL (ref 6.0–10.0)
Monocytes Absolute: 1.1 10*3/uL (ref 0.1–1.7)
Monocytes: 11.5 % (ref 3.0–15.0)
Neutrophils %: 74.6 % (ref 42.0–78.0)
Neutrophils Absolute: 7.2 10*3/uL (ref 1.7–8.6)
PLT CT: 264 10*3/uL (ref 130–440)
RBC: 4.42 10*6/uL (ref 4.00–5.70)
RDW: 15.4 % — ABNORMAL HIGH (ref 11.0–14.0)
WBC: 9.7 10*3/uL (ref 4.0–11.0)

## 2016-10-07 LAB — B-TYPE NATRIURETIC PEPTIDE: B-Natriuretic Peptide: 750.6 pg/mL — ABNORMAL HIGH (ref 0.0–100.0)

## 2016-10-08 ENCOUNTER — Encounter: Payer: Self-pay | Admitting: Specialist

## 2016-10-08 DIAGNOSIS — I482 Chronic atrial fibrillation, unspecified: Secondary | ICD-10-CM | POA: Insufficient documentation

## 2016-10-08 DIAGNOSIS — N184 Chronic kidney disease, stage 4 (severe): Secondary | ICD-10-CM | POA: Insufficient documentation

## 2016-10-08 NOTE — H&P (Signed)
H/P    Date Time: 3/12018 3:13 PM  [name],[name]  Cardiologist: [name], DO  Primary Care Physician: [name], MD      History of Presenting Illness:   [name] is a 66 y.o. male who presents to the office with   Chief Complaint   Patient presents with   . Follow-up     persistent atrial fibrillation    cc: Persistent atrial fibrillation    Patient seen jointly with [name].    Mr. Winsett is a pleasant 6 year old gentleman with a 5 year history of recurrent, now persistent atrial fibrillation. Patient has been cardioverted in 2013 and most recently in October 2017. Patient reverted quickly to atrial fibrillation. He was referred by his cardiologist Dr. Tanna Furry to electrophysiologist Dr. [name] for discussion of ablation procedures. Dr. [name] has referred to [name] to discuss hybrid ablation.    Patient reports in general he feels well but is somewhat fatigued with low energy and decreased activity tolerance. He does get short of breath with exertion.    TEE 06/04/16 showed EF 45%. RV with mild hypokinesis. Left and right atria mildly enlarged. 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 No thrombus. Bicuspid aortic valve with mild insufficiency. Mitral valve with mild insufficiency and tricuspid valve with mild insufficiency. Pulmonic valve with trace insufficiency.    Cardiac MRI on 03/16/16 shows LVEF 68%, RV EF 42%. Left ventricle without wall motion abnormality. Left atrium severely enlarged, right atrium moderately enlarged. Septal wall hypertrophy. Aorta dimensions show mild to moderate ascending aorta enlargement of 4.1 cm. Mild to moderate pericardial effusion. Aortic valve bicuspid with fusion of the left and right coronary cusp. Thickened leaflets. Peak velocity 2.5 m/s, mild AI. Tricuspid valve, mitral valve, pulmonic valve normal in appearance with trace to mild insufficiency.    PFTs 06/01/16 at [facility] pulmonary and internal medicine shows FEV1  49% of predicted value, total lung capacity 61% of predicted value, DLCO 58% of predicted value. Results will be sent for scanning into Epic.    Medical history positive for hypertension, kidney disease, fatty liver, dyslipidemia, type 2 diabetes, BPH, bipolar disorder. Surgeries include T&A, appendectomy, bilateral inguinal hernia repair.    Patient is a retired Theme [employer] manager, is widowed. He is a previous light smoker. Occasional alcohol use. Mother is living, age 8 with hypertension and diabetes. Father unknown.    Results for [name], [name] (MRN [id number]) as of 10/08/2016 15:18   Ref. Range 10/07/2016 16:49 10/07/2016 16:50   WBC Latest Ref Range: 4.0 - 11.0 K/cmm 9.7    Hemoglobin Latest Ref Range: 13.0 - 17.5 gm/dL 13.3    Hematocrit Latest Ref Range: 39.0 - 52.5 % 40.8    Platelet Count Latest Ref Range: 130 - 440 K/cmm 264    RBC Latest Ref Range: 4.00 - 5.70 M/cmm 4.42    MCV Latest Ref Range: 80 - 100 fL 92    MCH, POC Latest Ref Range: 28 - 35 pg 30    MCHC Latest Ref Range: 32 - 36 gm/dL 33    RDW Latest Ref Range: 11.0 - 14.0 % 15.4 (H)    MPV Latest Ref Range: 6.0 - 10.0 fL 7.0    NEUTROPHIL % Latest Ref Range: 42.0 - 78.0 % 74.6    Lymphocytes Latest Ref Range: 15.0 - 46.0 % 10.3 (L)    Monocytes Latest Ref Range: 3.0 - 15.0 % 11.5    Eosinophils % Latest Ref Range: 0.0 - 7.0 %  2.8    Basophils Automated Latest Ref Range: 0.0 - 3.0 % 0.9    Neutro # Latest Ref Range: 1.7 - 8.6 K/cmm 7.2    Lymphocytes Absolute Latest Ref Range: 0.6 - 5.1 K/cmm 1.0    Eosinophils Absolute Latest Ref Range: 0.0 - 0.8 K/cmm 0.3    Monocytes Absolute Latest Ref Range: 0.1 - 1.7 K/cmm 1.1    BASO Absolute Latest Ref Range: 0.0 - 0.3 K/cmm 0.1    Glucose Latest Ref Range: 70 - 99 mg/dL 83    BUN Latest Ref Range: 7 - 22 mg/dL 48 (H)    Creatinine Latest Ref Range: 0.80 - 1.30 mg/dL 2.13 (H)    BUN/Creatinine Ratio Latest Ref Range: 10.0 - 30.0 Ratio 22.5    Sodium Latest Ref Range: 136 - 147 mMol/L 144    Potassium Latest Ref Range:  3.5 - 5.3 mMol/L 3.9    Chloride Latest Ref Range: 98 - 110 mMol/L 106    Carbon Dioxide, Whole Blood Latest Ref Range: 20.0 - 30.0 mMol/L 27.0    Calcium Latest Ref Range: 8.5 - 10.5 mg/dL 9.3    Anion Gap Latest Ref Range: 7.0 - 18.0 mMol/L 14.9    EGFR Latest Ref Range: 60 - 150 mL/min/1.35m2 31 (L)    Osmolality Latest Ref Range: 275 - 300 mOsm/kg 299    AST Whole Blood Latest Ref Range: 10 - 42 U/L 24    ALT, Whole Blood Latest Ref Range: 0 - 55 U/L 21    Alkaline Phosphatase Latest Ref Range: 40 - 145 U/L 78    Albumin Latest Ref Range: 3.5 - 5.0 gm/dL 4.4    Protein, Total Latest Ref Range: 6.0 - 8.3 gm/dL 7.2    Globulin Latest Ref Range: 2.0 - 4.0 gm/dL 2.8    Albumin/Globulin Ratio Latest Ref Range: 0.70 - 1.50 Ratio 1.57 (H)    Bilirubin, Total Latest Ref Range: 0.1 - 1.2 mg/dL 0.6    B-Natriuretic Peptide Latest Ref Range: 0.0 - 100.0 pg/mL  750.6 (H)       Past Medical History:     Past Medical History:   Diagnosis Date   . Atrial fibrillation    . Atrial flutter    . Bipolar affective    . Diabetes mellitus    . Diabetic neuropathy    . Gastroesophageal reflux disease    . Gout    . Hypertension    . Paroxysmal atrial fibrillation        Past Surgical History:     Past Surgical History:   Procedure Laterality Date   . ADENOIDECTOMY     . APPENDECTOMY     . HERNIA REPAIR     . TONSILLECTOMY         Family History:     Family History   Problem Relation Age of Onset   . Hypertension Mother    . Diabetes Mother    . Heart disease Mother    . Hypertension Sister    . Diabetes Sister    . Hyperlipidemia Sister    . Hypertension Brother    . Heart disease Brother    . Hyperlipidemia Brother    . Hypertension Brother    . Diabetes Brother    . Hyperlipidemia Brother    . Hypertension Daughter        Social History:     History   Smoking Status   . Former Smoker   . Years: 40.00  Smokeless Tobacco   . Never Used     Comment: 1 pack would last a week     History   Alcohol Use   . Yes     Comment: socially      History   Drug Use No       Allergies:     Allergies   Allergen Reactions   . Codeine        Medications:     Current Outpatient Prescriptions   Medication Sig Dispense Refill   . allopurinol (ZYLOPRIM) 300 MG tablet Take 300 mg by mouth daily.     Marland Kitchen amiodarone (PACERONE) 200 MG tablet Take 200 mg by mouth daily.     Marland Kitchen atenolol (TENORMIN) 100 MG tablet Take 100 mg by mouth 2 (two) times daily.         . dilTIAZem (CARTIA XT) 240 MG 24 hr capsule Take 240 mg by mouth daily.     . divalproex EC/DR (DEPAKOTE EC/DR) 500 MG EC tablet Take 500 mg by mouth 2 (two) times daily.     . furosemide (LASIX) 40 MG tablet Take 40 mg by mouth daily.         . furosemide (LASIX) 80 MG tablet Take 80 mg by mouth daily.         Marland Kitchen gabapentin (NEURONTIN) 100 MG capsule Take 100 mg by mouth 2 (two) times daily.     Marland Kitchen glimepiride (AMARYL) 1 MG tablet Take 1 mg by mouth every morning before breakfast.     . lisinopril (PRINIVIL,ZESTRIL) 10 MG tablet Take 10 mg by mouth daily.         Marland Kitchen loperamide (IMODIUM) 2 MG capsule Take 2 mg by mouth 2 (two) times daily as needed.         . metFORMIN (GLUCOPHAGE) 500 MG tablet Take 500 mg by mouth 2 (two) times daily with meals.     . minoxidil (LONITEN) 10 MG tablet Take 30 mg by mouth daily.     . Multiple Vitamin (MULTIVITAMIN) capsule Take 1 capsule by mouth daily.     . pravastatin (PRAVACHOL) 40 MG tablet Take 40 mg by mouth every evening.     . rivaroxaban (XARELTO) 20 MG Tab Take 20 mg by mouth daily with dinner.     . tamsulosin (FLOMAX) 0.4 MG Cap Take 0.4 mg by mouth daily.       No current facility-administered medications for this visit.        Review of Systems:   Review of Systems   Constitutional: Positive for fatigue, low energy, decreased activity tolerance.  HENT: Wears glasses. Intact dentition. Heart of hearing. Negative for trouble swallowing, current URI symptoms.    Respiratory: Per history of present illness.   Cardiovascular: Negative for chest pain, hx of irregular  heartbeat or palpitations. Nocturia 1-2.  Gastrointestinal: Negative for nausea, vomiting, abdominal pain, diarrhea, constipation, or abdominal distention.   Genitourinary: Negative for dysuria, hematuria or difficulty voiding.   Skin: Negative for rash or lesions.   Extremities: Negative for edema.       Physical Exam:     Vitals:    10/07/16 1417   BP: 138/78   Pulse: 72   SpO2: 95%     Wt Readings from Last 3 Encounters:   10/07/16 84 kg (185 lb 3.2 oz)   09/27/16 85.8 kg (189 lb 3.2 oz)   01/13/15 86.4 kg (190 lb 7.6 oz)     Body mass index  is 29.01 kg/m.     General: no acute distress.  Pleasant affect and actively participates in conversation regarding his care.  Neuro: awake, alert, oriented x 3.   HEENT: eomi, sclera anicteric.  Cardiovascular: irregular rhythm, systolic ejection murmur  Lungs: clear to auscultation bilaterally without wheezing, rhonchi, or rales.  No significant diminishment.  Abdomen: soft, non-tender, non-distended.  Extremities: no edema.   Skin:  Pink, warm and dry.        Assessment/Plan:     Patient Active Problem List    Diagnosis Date Noted   . Persistent atrial fibrillation 10/07/2016   . Fatty liver 10/07/2016       1. Persistent atrial fibrillation - Mr. Halls is a pleasant 66 year old male with a 5 year history of persistent atrial fibrillation who is referred to discuss hybrid ablation. Patient will need total heart catheterization, carotid duplex which can be done by Dr. Jerene Pitch. He will need a TEE  24-48 hours pre-procedure to rule out left atrial appendage thrombus.  We will ask Dr. Jerene Pitch to do this as well.     Patient was sent for updated CBC, CMP, BNP. Liver enzymes stable. Patient's creatinine is elevated at 2.13, GFR 31. Last creatinine in Care Everywhere" was 1.81, GFR 38 on 08/30/2016. Patient was notified of results and advised to contact his primary care provider. A copy of lab work was faxed to Dr. [name]. BNP was 750. CBC unremarkable. Patient will have hybrid  ablation with [name], Dr. [name] on 11/22/16. I would like for Dr. [name] to send me updated renal functions 1-2 weeks prior to the procedure.     A copy of today's progress note will go to Dr. [name], Dr. Jerene Pitch, Dr. [name]. Please do not hesitate to contact [name] or me for questions or concerns. Office number [phone].        Signed by: Beverly Gust, NP    Please do not hesitate to contact us at [phone] for any questions or concerns.      This documentation was completed utilizing [employer].  Grammatical errors, random word insertions, pronoun errors and incomplete sentences are occasional consequences of this technology due to software limitations.  Questions or concerns about the content of this note or information contained within the body of this dictation they should be addressed with the provider for clarification.

## 2016-10-08 NOTE — Progress Notes (Signed)
See H& P.

## 2016-10-11 ENCOUNTER — Telehealth: Payer: Self-pay | Admitting: Specialist

## 2016-10-11 NOTE — Telephone Encounter (Signed)
Per [name] I need to schedule the patient for a Total Heart Cath, carotid, and a TEE. The patient had PFT/ABG completed at [facility] on 06/01/17 and aft reviewed by Arna Snipe, NP the patient will NOT need to complete a PFT/ABG test. I have faxed orders to Dr. Nicolette Bang office to get the Heart Cath and carotid scheduled. The TEE is scheduled for 11/19/16 and the patient will need to arrive at 7:30am at the heart and vascular center.     I followed up with Dr. Nicolette Bang office today and they stated they will review the orders and let me know if they can schedule the test as they do not usually do test that have outside orders. I am awaiting a call from them. If they are not able to do these test I will have them completed through [facility].    LM with patient letting him know that I am waiting for an answer on appointment information from Dr. Nicolette Bang office and I will f/u with him when I have more apt. Information.

## 2016-10-14 ENCOUNTER — Telehealth (INDEPENDENT_AMBULATORY_CARE_PROVIDER_SITE_OTHER): Payer: Self-pay | Admitting: Specialist

## 2016-10-14 ENCOUNTER — Telehealth: Payer: Self-pay

## 2016-10-14 NOTE — Telephone Encounter (Signed)
Left a message for patient to call us back to schedule a Carotid Doppler ref Dr Kathrin Ruddy dx: preop    Also scheduled patient for [facility] ref Dr Kathrin Ruddy PN:[id number]  afib ablation Scheduled this 10-21-2016 Dr Gery Pray to perfom arrival 7:30am

## 2016-10-14 NOTE — Telephone Encounter (Signed)
Confirmed with pt that he receives financial assistance through the hospital all testing needs to be sched at the hospital notified ramlawi's office

## 2016-10-14 NOTE — Telephone Encounter (Signed)
I SCHEDULED THE PATIENT FOR A CAROTID DUPLEX ON 3/14. HE WILL NEED TO CHECK IN AT THE HEART AND VASCULAR CENTER AT 3:00PM.     PT UNDERSTOOD AND WAS FINE WITH EVERYTHING.

## 2016-10-19 ENCOUNTER — Other Ambulatory Visit: Payer: Medicare Other

## 2016-10-19 NOTE — Telephone Encounter (Signed)
Patient has spoken to Hudson from [employer] office and Mickel Baas from [employer]. And has been made aware of all testing apt.

## 2016-10-20 ENCOUNTER — Ambulatory Visit
Admission: RE | Admit: 2016-10-20 | Discharge: 2016-10-20 | Disposition: A | Discharge status: Home / Self Care | Payer: Medicare Other | Source: Ambulatory Visit | Attending: Nurse Practitioner | Admitting: Nurse Practitioner

## 2016-10-20 DIAGNOSIS — I4819 Other persistent atrial fibrillation: Secondary | ICD-10-CM

## 2016-10-20 DIAGNOSIS — I6523 Occlusion and stenosis of bilateral carotid arteries: Secondary | ICD-10-CM | POA: Insufficient documentation

## 2016-10-20 DIAGNOSIS — E119 Type 2 diabetes mellitus without complications: Secondary | ICD-10-CM | POA: Insufficient documentation

## 2016-10-20 DIAGNOSIS — I481 Persistent atrial fibrillation: Secondary | ICD-10-CM | POA: Insufficient documentation

## 2016-10-20 DIAGNOSIS — I10 Essential (primary) hypertension: Secondary | ICD-10-CM | POA: Insufficient documentation

## 2016-10-20 DIAGNOSIS — Z01818 Encounter for other preprocedural examination: Secondary | ICD-10-CM | POA: Insufficient documentation

## 2016-10-21 ENCOUNTER — Encounter
Admission: RE | Disposition: A | Discharge status: Home / Self Care | Payer: Self-pay | Source: Ambulatory Visit | Attending: Interventional Cardiology

## 2016-10-21 ENCOUNTER — Ambulatory Visit: Payer: Medicare Other | Admitting: Interventional Cardiology

## 2016-10-21 ENCOUNTER — Ambulatory Visit
Admission: RE | Admit: 2016-10-21 | Discharge: 2016-10-21 | Disposition: A | Discharge status: Home / Self Care | Payer: Medicare Other | Source: Ambulatory Visit | Attending: Interventional Cardiology | Admitting: Interventional Cardiology

## 2016-10-21 DIAGNOSIS — Z87891 Personal history of nicotine dependence: Secondary | ICD-10-CM | POA: Insufficient documentation

## 2016-10-21 DIAGNOSIS — Z7984 Long term (current) use of oral hypoglycemic drugs: Secondary | ICD-10-CM | POA: Insufficient documentation

## 2016-10-21 DIAGNOSIS — F319 Bipolar disorder, unspecified: Secondary | ICD-10-CM | POA: Insufficient documentation

## 2016-10-21 DIAGNOSIS — N183 Chronic kidney disease, stage 3 (moderate): Secondary | ICD-10-CM | POA: Insufficient documentation

## 2016-10-21 DIAGNOSIS — Z7901 Long term (current) use of anticoagulants: Secondary | ICD-10-CM | POA: Insufficient documentation

## 2016-10-21 DIAGNOSIS — Q211 Atrial septal defect: Secondary | ICD-10-CM | POA: Insufficient documentation

## 2016-10-21 DIAGNOSIS — Q231 Congenital insufficiency of aortic valve: Secondary | ICD-10-CM | POA: Insufficient documentation

## 2016-10-21 DIAGNOSIS — E1122 Type 2 diabetes mellitus with diabetic chronic kidney disease: Secondary | ICD-10-CM | POA: Insufficient documentation

## 2016-10-21 DIAGNOSIS — I2721 Secondary pulmonary arterial hypertension: Secondary | ICD-10-CM | POA: Insufficient documentation

## 2016-10-21 DIAGNOSIS — I480 Paroxysmal atrial fibrillation: Secondary | ICD-10-CM | POA: Insufficient documentation

## 2016-10-21 DIAGNOSIS — I272 Pulmonary hypertension, unspecified: Secondary | ICD-10-CM | POA: Insufficient documentation

## 2016-10-21 DIAGNOSIS — I129 Hypertensive chronic kidney disease with stage 1 through stage 4 chronic kidney disease, or unspecified chronic kidney disease: Secondary | ICD-10-CM | POA: Insufficient documentation

## 2016-10-21 DIAGNOSIS — I481 Persistent atrial fibrillation: Secondary | ICD-10-CM | POA: Insufficient documentation

## 2016-10-21 DIAGNOSIS — K219 Gastro-esophageal reflux disease without esophagitis: Secondary | ICD-10-CM | POA: Insufficient documentation

## 2016-10-21 DIAGNOSIS — Z79899 Other long term (current) drug therapy: Secondary | ICD-10-CM | POA: Insufficient documentation

## 2016-10-21 DIAGNOSIS — K760 Fatty (change of) liver, not elsewhere classified: Secondary | ICD-10-CM | POA: Insufficient documentation

## 2016-10-21 DIAGNOSIS — M109 Gout, unspecified: Secondary | ICD-10-CM | POA: Insufficient documentation

## 2016-10-21 DIAGNOSIS — Z8249 Family history of ischemic heart disease and other diseases of the circulatory system: Secondary | ICD-10-CM | POA: Insufficient documentation

## 2016-10-21 DIAGNOSIS — I313 Pericardial effusion (noninflammatory): Secondary | ICD-10-CM | POA: Insufficient documentation

## 2016-10-21 DIAGNOSIS — I4891 Unspecified atrial fibrillation: Secondary | ICD-10-CM

## 2016-10-21 DIAGNOSIS — E1140 Type 2 diabetes mellitus with diabetic neuropathy, unspecified: Secondary | ICD-10-CM | POA: Insufficient documentation

## 2016-10-21 LAB — I-STAT CHEM 8 CARTRIDGE
Anion Gap I-Stat: 17 — ABNORMAL HIGH (ref 7–16)
BUN I-Stat: 38 mg/dL — ABNORMAL HIGH (ref 6–20)
Calcium Ionized I-Stat: 4.7 mg/dL (ref 4.35–5.10)
Chloride I-Stat: 103 mMol/L (ref 98–112)
Creatinine I-Stat: 1.9 mg/dL — ABNORMAL HIGH (ref 0.90–1.30)
EGFR: 36 mL/min/{1.73_m2} — ABNORMAL LOW (ref 60–150)
Glucose I-Stat: 117 mg/dL — ABNORMAL HIGH (ref 70–99)
Hematocrit I-Stat: 39 % (ref 39.0–52.5)
Hemoglobin I-Stat: 13.3 gm/dL (ref 13.0–17.5)
Potassium I-Stat: 3.9 mMol/L (ref 3.5–5.3)
Sodium I-Stat: 142 mMol/L (ref 135–145)
TCO2 I-Stat: 27 mMol/L (ref 24–29)

## 2016-10-21 LAB — CBC AND DIFFERENTIAL
Basophils %: 0.8 % (ref 0.0–3.0)
Basophils Absolute: 0.1 10*3/uL (ref 0.0–0.3)
Eosinophils %: 4.9 % (ref 0.0–7.0)
Eosinophils Absolute: 0.3 10*3/uL (ref 0.0–0.8)
Hematocrit: 40.5 % (ref 39.0–52.5)
Hemoglobin: 13.3 gm/dL (ref 13.0–17.5)
Lymphocytes Absolute: 0.9 10*3/uL (ref 0.6–5.1)
Lymphocytes: 13 % — ABNORMAL LOW (ref 15.0–46.0)
MCH: 31 pg (ref 28–35)
MCHC: 33 gm/dL (ref 32–36)
MCV: 93 fL (ref 80–100)
MPV: 7.1 fL (ref 6.0–10.0)
Monocytes Absolute: 1 10*3/uL (ref 0.1–1.7)
Monocytes: 13.7 % (ref 3.0–15.0)
Neutrophils %: 67.5 % (ref 42.0–78.0)
Neutrophils Absolute: 4.7 10*3/uL (ref 1.7–8.6)
PLT CT: 235 10*3/uL (ref 130–440)
RBC: 4.35 10*6/uL (ref 4.00–5.70)
RDW: 15.7 % — ABNORMAL HIGH (ref 11.0–14.0)
WBC: 6.9 10*3/uL (ref 4.0–11.0)

## 2016-10-21 LAB — LIPID PANEL
Cholesterol: 91 mg/dL (ref 75–199)
Coronary Heart Disease Risk: 2.76
HDL: 33 mg/dL — ABNORMAL LOW (ref 40–55)
LDL Calculated: 46 mg/dL
Triglycerides: 59 mg/dL (ref 10–150)
VLDL: 12 (ref 0–40)

## 2016-10-21 LAB — ECG 12-LEAD
Patient Age: 65 years
Q-T Interval(Corrected): 493 ms
Q-T Interval: 463 ms
QRS Axis: 36 deg
QRS Duration: 93 ms
T Axis: 97 years
Ventricular Rate: 68 //min

## 2016-10-21 LAB — VH I-STAT CHEM 8 NOTIFICATION

## 2016-10-21 SURGERY — RIGHT & LEFT HEART CATH

## 2016-10-21 MED ORDER — IODIXANOL 320 MG/ML IV SOLN
31.0000 mL | Freq: Once | INTRAVENOUS | Status: AC
Start: 2016-10-21 — End: 2016-10-21
  Administered 2016-10-21: 14:00:00 31 mL via INTRA_ARTERIAL

## 2016-10-21 MED ORDER — MIDAZOLAM HCL 2 MG/2ML IJ SOLN
INTRAMUSCULAR | Status: AC
Start: 2016-10-21 — End: 2016-10-21
  Administered 2016-10-21: 14:00:00 1 mg via INTRAVENOUS
  Filled 2016-10-21: qty 2

## 2016-10-21 MED ORDER — SODIUM CHLORIDE 0.9 % IV SOLN
INTRAVENOUS | Status: AC
Start: 2016-10-21 — End: 2016-10-21

## 2016-10-21 MED ORDER — HEPARIN SODIUM (PORCINE) 1000 UNIT/ML IJ SOLN
INTRAMUSCULAR | Status: AC
Start: 2016-10-21 — End: 2016-10-21
  Administered 2016-10-21: 14:00:00 4000 [IU] via INTRAVENOUS
  Filled 2016-10-21: qty 10

## 2016-10-21 MED ORDER — VH HEPARIN 10,000 UNITS/1000 ML NS
INJECTION | INTRAVENOUS | Status: AC
Start: 2016-10-21 — End: 2016-10-21
  Filled 2016-10-21: qty 1000

## 2016-10-21 MED ORDER — LIDOCAINE HCL 2 % IJ SOLN
INTRAMUSCULAR | Status: AC
Start: 2016-10-21 — End: 2016-10-21
  Filled 2016-10-21: qty 10

## 2016-10-21 MED ORDER — ACETAMINOPHEN 325 MG PO TABS
650.0000 mg | ORAL_TABLET | ORAL | Status: DC | PRN
Start: 2016-10-21 — End: 2016-10-21

## 2016-10-21 MED ORDER — HEPARIN (PORCINE) IN NACL 2-0.9 UNIT/ML-% IJ SOLN
INTRAMUSCULAR | Status: AC
Start: 2016-10-21 — End: 2016-10-21
  Filled 2016-10-21: qty 1000

## 2016-10-21 MED ORDER — VERAPAMIL HCL 2.5 MG/ML IV SOLN
INTRAVENOUS | Status: AC
Start: 2016-10-21 — End: 2016-10-21
  Administered 2016-10-21: 14:00:00 2.5 mg via INTRA_ARTERIAL
  Filled 2016-10-21: qty 2

## 2016-10-21 MED ORDER — FENTANYL CITRATE (PF) 50 MCG/ML IJ SOLN (WRAP)
INTRAMUSCULAR | Status: AC
Start: 2016-10-21 — End: 2016-10-21
  Administered 2016-10-21: 14:00:00 50 ug via INTRAVENOUS
  Filled 2016-10-21: qty 5

## 2016-10-21 NOTE — Progress Notes (Signed)
Ishaped report given to Tara, RTR, tele off,  pt to procedure via stretcher.

## 2016-10-21 NOTE — H&P (Signed)
HISTORY AND PHYSICAL  [facility] CARDIOLOGY & VASCULAR MEDICINE    Date Time: 10/21/16 9:09 AM  [name], [name]  MRN#: [id number]  DOB: 12/22/1950  Requesting Physician: [name], MD  PCP: [name], MD    Reason for Admission:   Cardiac catheterization    History:   [name] is a 66 y.o. male with a history of persistent atrial fibrillation. Was seen by CV surgery.  He was referred by his cardiologist Dr. Tanna Furry to electrophysiologist Dr. [name] for discussion of ablation procedures. Dr. [name] has referred to [name] to discuss hybrid ablation. Here this morning for total heart catheterization for this procedure. He will then require a TEE 24-48 hours prior to this procedures were all left atrial appendage thrombus.  He has had his carotid ultrasound has less than 50% stenosis bilaterally internal carotids    Prior testing:  TEE 06/04/16 showed EF 45%. RV with mild hypokinesis. Left and right atria mildly enlarged. 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 No thrombus. Bicuspid aortic valve with mild insufficiency. Mitral valve with mild insufficiency and tricuspid valve with mild insufficiency. Pulmonic valve with trace insufficiency.    Cardiac MRI on 03/16/16 shows LVEF 68%, RV EF 42%. Left ventricle without wall motion abnormality. Left atrium severely enlarged, right atrium moderately enlarged. Septal wall hypertrophy. Aorta dimensions show mild to moderate ascending aorta enlargement of 4.1 cm. Mild to moderate pericardial effusion. Aortic valve bicuspid with fusion of the left and right coronary cusp. Thickened leaflets. Peak velocity 2.5 m/s, mild AI. Tricuspid valve, mitral valve, pulmonic valve normal in appearance with trace to mild insufficiency.    PFTs 06/01/16 at [facility] pulmonary and internal medicine shows FEV1 49% of predicted value, total lung capacity 61% of predicted value, DLCO 58% of predicted value. Results will be sent for scanning  into Epic.    Past Medical History:     Past Medical History:   Diagnosis Date   . Atrial fibrillation    . Atrial flutter    . Bipolar affective    . Diabetes mellitus    . Diabetic neuropathy    . Gastroesophageal reflux disease    . Gout    . Hypertension    . Paroxysmal atrial fibrillation    -Carotid artery disease  A. U/s carotids bilat <50% stenosis    Past Surgical History:     Past Surgical History:   Procedure Laterality Date   . ADENOIDECTOMY     . APPENDECTOMY     . HERNIA REPAIR     . TONSILLECTOMY         Family History:     Family History   Problem Relation Age of Onset   . Hypertension Mother    . Diabetes Mother    . Heart disease Mother    . Hypertension Sister    . Diabetes Sister    . Hyperlipidemia Sister    . Hypertension Brother    . Heart disease Brother    . Hyperlipidemia Brother    . Hypertension Brother    . Diabetes Brother    . Hyperlipidemia Brother    . Hypertension Daughter        Social History:     Social History     Social History   . Marital status: Widowed     Spouse name: N/A   . Number of children: N/A   . Years of education: N/A     Social History Main Topics   .  Smoking status: Former Smoker     Years: 40.00   . Smokeless tobacco: Never Used      Comment: 1 pack would last a week   . Alcohol use Yes      Comment: socially   . Drug use: No   . Sexual activity: Not on file     Other Topics Concern   . Not on file     Social History Narrative   . No narrative on file       Allergies:     Allergies   Allergen Reactions   . Codeine        Medications:     allopurinol (ZYLOPRIM) 300 MG tablet Take 300 mg by mouth daily.      Marland Kitchen amiodarone (PACERONE) 200 MG tablet Take 200 mg by mouth daily.     Marland Kitchen atenolol (TENORMIN) 100 MG tablet Take 100 mg by mouth 2 (two) times daily.         . dilTIAZem (CARTIA XT) 240 MG 24 hr capsule Take 240 mg by mouth daily.     . divalproex EC/DR (DEPAKOTE EC/DR) 500 MG EC tablet Take 500 mg by mouth 2 (two) times daily.     . furosemide  (LASIX) 40 MG tablet Take 40 mg by mouth daily.         . furosemide (LASIX) 80 MG tablet Take 80 mg by mouth daily.         Marland Kitchen gabapentin (NEURONTIN) 100 MG capsule Take 100 mg by mouth 2 (two) times daily.     Marland Kitchen glimepiride (AMARYL) 1 MG tablet Take 1 mg by mouth every morning before breakfast.     . lisinopril (PRINIVIL,ZESTRIL) 10 MG tablet Take 10 mg by mouth daily.         Marland Kitchen loperamide (IMODIUM) 2 MG capsule Take 2 mg by mouth 2 (two) times daily as needed.         . metFORMIN (GLUCOPHAGE) 500 MG tablet Take 500 mg by mouth 2 (two) times daily with meals.     . minoxidil (LONITEN) 10 MG tablet Take 30 mg by mouth daily.     . Multiple Vitamin (MULTIVITAMIN) capsule Take 1 capsule by mouth daily.     . pravastatin (PRAVACHOL) 40 MG tablet Take 40 mg by mouth every evening.     . rivaroxaban (XARELTO) 20 MG Tab Take 20 mg by mouth daily with dinner.     . tamsulosin (FLOMAX) 0.4 MG Cap Take 0.4 mg by mouth daily.           Review of Systems:   A comprehensive review of systems was: Otherwise negative except as stated above.    Physical Exam:     Vitals:    10/21/16 0815   BP: 116/82   Pulse: 71   Resp: 22   Temp: 98.1 F (36.7 C)   SpO2: 95%       Intake and Output Summary (Last 24 hours) at Date Time  No intake or output data in the 24 hours ending 10/21/16 0909    General appearance - alert, well appearing, and in no distress  Neck - supple, no significant adenopathy, no thyromegaly, no masses  Chest - clear to auscultation, no wheezes, rales or rhonchi  Heart - normal rate, regular rhythm, normal S1, S2, no murmurs, rubs, clicks or gallops  Abdomen - soft, nontender, nondistended, no masses or organomegaly  Neurological - alert, oriented, cranial nerves grossly intact, moves all  extremities well.  Extremities - peripheral pulses normal, 2/4pedal edema  Skin - normal coloration and turgor, no rashes, no suspicious skin lesions noted      Labs Reviewed:       Recent Labs  Lab  10/21/16  0818   i-STAT Creatinine 1.90*                                         Assessment/Plan:   1. Persistent atrial fibrillation, scheduled for hybrid ablation  2. Mild bilateral carotid artery disease  3. Fatty liver with prior elevation in LFTs this is reported to be stable  4. Stage III chronic kidney disease with his most recent creatinine 1.8 in August 30, 2016 he will have labs this morning  4. Bipolar disorder  5. Dr. Gery Pray will evaluate with anticipation of THC today risk and benefits have been explained consent signed reviewed      Signed by:  Edyth Gunnels PA-C

## 2016-10-21 NOTE — Discharge Instructions (Signed)
Discharge Instructions-Radial Catheterization    Activities:    *Rest and limit the use of the affected arm for the first 24 hours.   *No driving for 24 hours.   *You may take a shower stating the day after the procedure.   *Do NOT get wrist heavily dirty for 3 days. No gardening, no housecleaning, etc. For 3 days.   *Do NOT soak your wrist for the next 7 days. No tub bath, no dishwashing, no swimming.     Care of Procedure Site:    * Observe the procedure site daily.   *Remove the dressing the day after the procedure.   *Keep site clean, dry, and covered with a band-aid for 3 days. Change band-aid as needed.   *Slight tenderness or soreness at the procedure site, and/or tingling of the fingers/hand of same side as procedure site may occur for up to 3 days after the procedure.  If these symptoms continue, notify your physician.   *If there is uncontrolled bleeding at procedure site or rapid swelling at procedure site or rapid swelling at procedure site, apply pressure to the site, Call 911 immediately.     Notify your doctor to report any of the following:     *Fever 101 or higher  *Swelling at procedure site  *Redness at procedure site  *Drainage at procedure site  *Increased discomfort procedure site  *New/Increased numbness in your hand of same side of procedure site

## 2016-10-21 NOTE — Progress Notes (Signed)
Pt admitted to CIU, pt oriented to room and call bell, call bell within reach, assessment completed, IV started. Pt states difficulty breathing while in a lying position.  Kristen PA notified no additional orders received.

## 2016-10-21 NOTE — Procedures (Signed)
[name]    Total Heart Catheterization Final Procedure Report    [name] Number: [id number]  CSN:   [id number]  Date of Birth:  [birth year]  Date of Service: 10/21/16  Primary: [name], MD  Primary Cardiologist: Jerene Pitch    Procedure performed:   Right and left heart catheterization  Selective coronary angiography  Left ventriculography    Attending: Dr. Gery Pray    Type of Anesthesia: Conscious sedation     Indication: Persistent atrial fibrillation in preparation for hybrid AF ablation     Access:  Right Radial  and right brachia    Closure :  Manual pressure and TR band    Anticoagulants:  Heparin    Antiplatelets:  Aspirin    Moderate conscious Sedation:  Moderate conscious sedation was performed. A preprocedural assessment of the patient was completed including review of past medical/surgical history,previous anesthesia sedation experience and family history. Patient's medications and drug allergies were reviewed. Patient examined with vital signs reviewed.  Incremental amounts of medications given to attain moderate level of consciousness.There was continuous face-to-face attendance by myself with trained nurse  monitoring patient's consciousness and physiologic status throughout the procedure.     My total intraservice face-to-face time for moderate sedation: 30 Minutes.    Findings/Interventions/Complications:     Hemodynamic Findings     Pressure (S/D/EDP mmHg) Mean (mmHg) V-waves (mmHg)   Right Atrium  15 16   Right Ventricle 67/4-14     Pulmonary Artery 67/27 42    Wedge  24 34   Aortic Pressure 106/55 76    LV Pressure 122/6-20       Heart Rate 61 bpm   Blood Pressure 106/55 mmHg   PA saturation 66 %   Ao saturation 96 %   Fick CO 5.0 L/min   Fick CI 2.54 L/min/m2   Pulm Vasc Resistance 3.6 WU     Cinefluoroscopy: There is sigificant aortic valve calcification with visibly reduced leaflet excursion. There is moderate proximal coronary  calcification.    Angiographic Findings  Left Main: free of disease.    LAD: large wrap-around vessel with moderate diffuse calcification and nonobstructive atherosclerotic changes. 2 medium-sized diagonal branches are free of disease.  LCX: large, dominant vessel. Small OM1. OM2 originates mid vessel, is large and free of lesions.  OM 3 originates just beyond this, is medium-sized, free of disease. Cx continues to give rise to distal posterolateral branches and LPDA, all free of obstructive disease.  RCA: nondominant, free of disease    Impression:  1. No significant CAD  2. Pulmonary hypertension  3. Persistent AF    Recommendations and Follow-up:   1. Refer back to Dr. Reggy Eye    Melvern Banker, MD, [facility], [name]  Pager [phone]; [phone]      Note: This chart was generated by the Epic EMR system/speech recognition and may contain inherent errors or omissions not intended by the user. Grammatical errors, random word insertions, deletions, pronoun errors and incomplete sentences are occasional consequences of this technology due to software limitations. Not all errors are caught or corrected. If there are questions or concerns about the content of this note or information contained within the body of this dictation they should be addressed directly with the author for clarification.

## 2016-10-21 NOTE — Progress Notes (Signed)
Discharge instructions reviewed with pt, pt verbalized understanding, IV discontinued, cath intact, tele off, pt up to get dressed, pt discharge ambulatory to home with family.

## 2016-10-21 NOTE — Discharge Summary (Signed)
Discharge Summary    Date:10/21/2016   [name]  Attending Physician: [name], MD    Date of Admission:   10/21/2016    Date of Discharge:   10/21/2016    Admitting Diagnosis:   Atrial fibrillation    Discharge Dx:     Principal Diagnosis (Diagnosis after study, that is chiefly responsible for admission to inpatient status):   Atrial fibrillation  Active Hospital Problems    Diagnosis POA   . Atrial fibrillation, persistent Yes      Resolved Hospital Problems    Diagnosis POA   No resolved problems to display.       Treatment Team:   Treatment Team:   Attending Provider: [name], MD     Procedures performed:   Surgery: all results from this admission  Procedure(s) with comments:  Right & Left Heart Cath (N/A) - ARRIVAL TIME 0730    Reason for Admission:   Cardiac catheterization  Hospital Course:     Uncomplicated    Condition at Discharge:   Stable    Today:     BP 114/70   Pulse (!) 59   Temp 98.1 F (36.7 C) (Tympanic)   Resp 19   Ht 1.702 m (5\' 7" )   Wt 85.5 kg (188 lb 9.6 oz)   SpO2 95%   BMI 29.54 kg/m   Ranges for the last 24 hours:  Temp:  [98.1 F (36.7 C)] 98.1 F (36.7 C)  Heart Rate:  [57-71] 59  Resp Rate:  [17-22] 19  BP: (101-116)/(69-82) 114/70    Last set of labs     Recent Labs  Lab 10/21/16  0810   WBC 6.9   Hemoglobin 13.3   Hematocrit 40.5   PLT CT 235       Recent Labs  Lab 10/21/16  0818   i-STAT Creatinine 1.90*   EGFR 36*           Invalid input(s): FREET4    Recent Labs  Lab 10/21/16  0810   Cholesterol 91   Triglycerides 59   HDL 33*   LDL Calculated 46       Micro / Labs / Path pending:     Unresulted Labs     Procedure . . . Date/Time    Right & Left Heart Cath [[id number]] Resulted:  10/21/16 0823     Updated:  10/21/16 1401          Discharge Instructions:     Follow-up Information     Kellogg, Delphia Grates, MD .    Specialty:  [facility]  Contact information:  [address]  [place] [address]  [phone]                    Discharge Diet: Cardiac Consistent Carbohydrates        Disposition:  Home or Self Care     Discharge Medication List      Taking    allopurinol 300 MG tablet  Dose:  300 mg  Commonly known as:  ZYLOPRIM  Take 300 mg by mouth daily.     amiodarone 200 MG tablet  Dose:  200 mg  Commonly known as:  PACERONE  Take 200 mg by mouth daily.     atenolol 100 MG tablet  Dose:  100 mg  Commonly known as:  TENORMIN  Take 100 mg by mouth 2 (two) times daily.     CARTIA XT 240 MG 24  hr capsule  Dose:  240 mg  Generic drug:  dilTIAZem  Take 240 mg by mouth daily.     divalproex EC/DR 500 MG EC tablet  Dose:  500 mg  Commonly known as:  DEPAKOTE EC/DR  Take 500 mg by mouth 2 (two) times daily.     * furosemide 80 MG tablet  Dose:  80 mg  Commonly known as:  LASIX  For:  take with 40mg  Lasix  Take 80 mg by mouth daily.     * furosemide 40 MG tablet  Dose:  40 mg  Commonly known as:  LASIX  For:  take with 80mg  lasix  Take 40 mg by mouth daily.     gabapentin 100 MG capsule  Dose:  100 mg  Commonly known as:  NEURONTIN  Take 100 mg by mouth 2 (two) times daily.     glimepiride 1 MG tablet  Dose:  1 mg  Commonly known as:  AMARYL  Take 1 mg by mouth every morning before breakfast.     lisinopril 10 MG tablet  Dose:  10 mg  Commonly known as:  PRINIVIL,ZESTRIL  Take 10 mg by mouth daily.     loperamide 2 MG capsule  Dose:  2 mg  Commonly known as:  IMODIUM  Take 2 mg by mouth 2 (two) times daily as needed.     metFORMIN 500 MG tablet  Dose:  500 mg  Commonly known as:  GLUCOPHAGE  Take 500 mg by mouth 2 (two) times daily with meals.     minoxidil 10 MG tablet  Dose:  30 mg  Commonly known as:  LONITEN  For:  take 3 10mg  tablets daily  Take 30 mg by mouth daily.     multivitamin capsule  Dose:  1 capsule  Take 1 capsule by mouth daily.     pravastatin 40 MG tablet  Dose:  40 mg  Commonly known as:  PRAVACHOL  Take 40 mg by mouth every evening.     tamsulosin 0.4 MG Caps  Dose:  0.4 mg  Commonly known as:  FLOMAX  Take 0.4 mg by  mouth daily.     XARELTO 20 MG Tabs  Dose:  20 mg  Generic drug:  rivaroxaban  Take 20 mg by mouth daily with dinner.        * This list has 2 medication(s) that are the same as other medications prescribed for you. Read the directions carefully, and ask your doctor or other care provider to review them with you.              Minutes spent coordinating discharge and reviewing discharge plan:20 minutes      Signed by: [name], MD

## 2016-10-27 ENCOUNTER — Encounter: Payer: Self-pay | Admitting: Nurse Practitioner

## 2016-11-15 ENCOUNTER — Ambulatory Visit
Admission: RE | Admit: 2016-11-15 | Discharge: 2016-11-15 | Disposition: A | Discharge status: Home / Self Care | Payer: Medicare Other | Source: Ambulatory Visit | Attending: Nurse Practitioner | Admitting: Nurse Practitioner

## 2016-11-15 ENCOUNTER — Ambulatory Visit: Payer: Medicare Other

## 2016-11-15 DIAGNOSIS — I4892 Unspecified atrial flutter: Secondary | ICD-10-CM | POA: Insufficient documentation

## 2016-11-15 DIAGNOSIS — R7989 Other specified abnormal findings of blood chemistry: Secondary | ICD-10-CM | POA: Insufficient documentation

## 2016-11-15 DIAGNOSIS — E119 Type 2 diabetes mellitus without complications: Secondary | ICD-10-CM | POA: Insufficient documentation

## 2016-11-15 DIAGNOSIS — R791 Abnormal coagulation profile: Secondary | ICD-10-CM | POA: Insufficient documentation

## 2016-11-15 DIAGNOSIS — I10 Essential (primary) hypertension: Secondary | ICD-10-CM | POA: Insufficient documentation

## 2016-11-15 DIAGNOSIS — Z01811 Encounter for preprocedural respiratory examination: Secondary | ICD-10-CM

## 2016-11-15 DIAGNOSIS — R0609 Other forms of dyspnea: Secondary | ICD-10-CM | POA: Insufficient documentation

## 2016-11-15 DIAGNOSIS — I517 Cardiomegaly: Secondary | ICD-10-CM | POA: Insufficient documentation

## 2016-11-15 LAB — APTT: aPTT: 41.7 s — ABNORMAL HIGH (ref 24.0–34.0)

## 2016-11-15 LAB — VH URINALYSIS WITH MICROSCOPIC AND CULTURE IF INDICATED
Bilirubin, UA: NEGATIVE
Blood, UA: NEGATIVE
Glucose, UA: NEGATIVE mg/dL
Ketones UA: NEGATIVE mg/dL
Leukocyte Esterase, UA: NEGATIVE Leu/uL
Nitrite, UA: NEGATIVE
Protein, UR: NEGATIVE mg/dL
Urine Specific Gravity: 1.009 (ref 1.001–1.040)
Urobilinogen, UA: NORMAL mg/dL
pH, Urine: 6 pH (ref 5.0–8.0)

## 2016-11-15 LAB — CBC AND DIFFERENTIAL
Basophils %: 0.7 % (ref 0.0–3.0)
Basophils Absolute: 0 10*3/uL (ref 0.0–0.3)
Eosinophils %: 5.8 % (ref 0.0–7.0)
Eosinophils Absolute: 0.4 10*3/uL (ref 0.0–0.8)
Hematocrit: 41.5 % (ref 39.0–52.5)
Hemoglobin: 13.4 gm/dL (ref 13.0–17.5)
Lymphocytes Absolute: 1 10*3/uL (ref 0.6–5.1)
Lymphocytes: 14.6 % — ABNORMAL LOW (ref 15.0–46.0)
MCH: 30 pg (ref 28–35)
MCHC: 32 gm/dL (ref 32–36)
MCV: 93 fL (ref 80–100)
MPV: 6.7 fL (ref 6.0–10.0)
Monocytes Absolute: 0.8 10*3/uL (ref 0.1–1.7)
Monocytes: 11.3 % (ref 3.0–15.0)
Neutrophils %: 67.5 % (ref 42.0–78.0)
Neutrophils Absolute: 4.6 10*3/uL (ref 1.7–8.6)
PLT CT: 237 10*3/uL (ref 130–440)
RBC: 4.44 10*6/uL (ref 4.00–5.70)
RDW: 15.9 % — ABNORMAL HIGH (ref 11.0–14.0)
WBC: 6.8 10*3/uL (ref 4.0–11.0)

## 2016-11-15 LAB — COMPREHENSIVE METABOLIC PANEL
ALT: 21 U/L (ref 0–55)
AST (SGOT): 19 U/L (ref 10–42)
Albumin/Globulin Ratio: 1.48 Ratio (ref 0.70–1.50)
Albumin: 4.3 gm/dL (ref 3.5–5.0)
Alkaline Phosphatase: 75 U/L (ref 40–145)
Anion Gap: 10.7 mMol/L (ref 7.0–18.0)
BUN / Creatinine Ratio: 17.6 Ratio (ref 10.0–30.0)
BUN: 36 mg/dL — ABNORMAL HIGH (ref 7–22)
Bilirubin, Total: 0.8 mg/dL (ref 0.1–1.2)
CO2: 31.2 mMol/L — ABNORMAL HIGH (ref 20.0–30.0)
Calcium: 9.6 mg/dL (ref 8.5–10.5)
Chloride: 104 mMol/L (ref 98–110)
Creatinine: 2.05 mg/dL — ABNORMAL HIGH (ref 0.80–1.30)
EGFR: 33 mL/min/{1.73_m2} — ABNORMAL LOW (ref 60–150)
Globulin: 2.9 gm/dL (ref 2.0–4.0)
Glucose: 178 mg/dL — ABNORMAL HIGH (ref 71–99)
Osmolality Calc: 296 mOsm/kg (ref 275–300)
Potassium: 3.9 mMol/L (ref 3.5–5.3)
Protein, Total: 7.2 gm/dL (ref 6.0–8.3)
Sodium: 142 mMol/L (ref 136–147)

## 2016-11-15 LAB — TYPE AND SCREEN
AB Screen: NEGATIVE
ABO Rh: O POS

## 2016-11-15 LAB — PT/INR
PT INR: 1.7 — ABNORMAL HIGH (ref 0.5–1.3)
PT: 17.6 s — ABNORMAL HIGH (ref 9.5–11.5)

## 2016-11-15 LAB — B-TYPE NATRIURETIC PEPTIDE: B-Natriuretic Peptide: 1041.5 pg/mL — ABNORMAL HIGH (ref 0.0–100.0)

## 2016-11-15 LAB — HEMOGLOBIN A1C: Hgb A1C, %: 6.5 %

## 2016-11-15 NOTE — Pre-Procedure Instructions (Signed)
Right and left heart cath done 10/2016 in Epic under Imaging.

## 2016-11-19 ENCOUNTER — Encounter: Payer: Self-pay | Admitting: Anesthesiology

## 2016-11-19 ENCOUNTER — Other Ambulatory Visit: Payer: Self-pay | Admitting: Nurse Practitioner

## 2016-11-19 ENCOUNTER — Ambulatory Visit
Admission: RE | Admit: 2016-11-19 | Discharge: 2016-11-19 | Disposition: A | Discharge status: Home / Self Care | Payer: Medicare Other | Source: Ambulatory Visit | Attending: Nurse Practitioner | Admitting: Nurse Practitioner

## 2016-11-19 DIAGNOSIS — Z8673 Personal history of transient ischemic attack (TIA), and cerebral infarction without residual deficits: Secondary | ICD-10-CM

## 2016-11-19 DIAGNOSIS — I083 Combined rheumatic disorders of mitral, aortic and tricuspid valves: Secondary | ICD-10-CM | POA: Insufficient documentation

## 2016-11-19 DIAGNOSIS — M1990 Unspecified osteoarthritis, unspecified site: Secondary | ICD-10-CM | POA: Diagnosis present

## 2016-11-19 DIAGNOSIS — E872 Acidosis: Secondary | ICD-10-CM | POA: Diagnosis not present

## 2016-11-19 DIAGNOSIS — I4819 Other persistent atrial fibrillation: Secondary | ICD-10-CM

## 2016-11-19 DIAGNOSIS — Z720 Tobacco use: Secondary | ICD-10-CM

## 2016-11-19 DIAGNOSIS — E785 Hyperlipidemia, unspecified: Secondary | ICD-10-CM | POA: Diagnosis present

## 2016-11-19 DIAGNOSIS — Z833 Family history of diabetes mellitus: Secondary | ICD-10-CM

## 2016-11-19 DIAGNOSIS — R402254 Coma scale, best verbal response, oriented, 24 hours or more after hospital admission: Secondary | ICD-10-CM | POA: Diagnosis not present

## 2016-11-19 DIAGNOSIS — F319 Bipolar disorder, unspecified: Secondary | ICD-10-CM | POA: Diagnosis present

## 2016-11-19 DIAGNOSIS — I69351 Hemiplegia and hemiparesis following cerebral infarction affecting right dominant side: Secondary | ICD-10-CM

## 2016-11-19 DIAGNOSIS — Z885 Allergy status to narcotic agent status: Secondary | ICD-10-CM

## 2016-11-19 DIAGNOSIS — K760 Fatty (change of) liver, not elsewhere classified: Secondary | ICD-10-CM | POA: Diagnosis present

## 2016-11-19 DIAGNOSIS — N170 Acute kidney failure with tubular necrosis: Secondary | ICD-10-CM | POA: Diagnosis not present

## 2016-11-19 DIAGNOSIS — J440 Chronic obstructive pulmonary disease with acute lower respiratory infection: Secondary | ICD-10-CM | POA: Diagnosis not present

## 2016-11-19 DIAGNOSIS — Y95 Nosocomial condition: Secondary | ICD-10-CM | POA: Diagnosis not present

## 2016-11-19 DIAGNOSIS — N400 Enlarged prostate without lower urinary tract symptoms: Secondary | ICD-10-CM | POA: Diagnosis present

## 2016-11-19 DIAGNOSIS — I272 Pulmonary hypertension, unspecified: Secondary | ICD-10-CM | POA: Diagnosis present

## 2016-11-19 DIAGNOSIS — Z7982 Long term (current) use of aspirin: Secondary | ICD-10-CM

## 2016-11-19 DIAGNOSIS — Z9861 Coronary angioplasty status: Secondary | ICD-10-CM

## 2016-11-19 DIAGNOSIS — I639 Cerebral infarction, unspecified: Secondary | ICD-10-CM | POA: Diagnosis not present

## 2016-11-19 DIAGNOSIS — J189 Pneumonia, unspecified organism: Secondary | ICD-10-CM | POA: Diagnosis not present

## 2016-11-19 DIAGNOSIS — N184 Chronic kidney disease, stage 4 (severe): Secondary | ICD-10-CM | POA: Diagnosis present

## 2016-11-19 DIAGNOSIS — E1122 Type 2 diabetes mellitus with diabetic chronic kidney disease: Secondary | ICD-10-CM | POA: Diagnosis present

## 2016-11-19 DIAGNOSIS — K560 Paralytic ileus: Secondary | ICD-10-CM | POA: Diagnosis not present

## 2016-11-19 DIAGNOSIS — Z794 Long term (current) use of insulin: Secondary | ICD-10-CM

## 2016-11-19 DIAGNOSIS — F3181 Bipolar II disorder: Secondary | ICD-10-CM | POA: Diagnosis present

## 2016-11-19 DIAGNOSIS — E1165 Type 2 diabetes mellitus with hyperglycemia: Secondary | ICD-10-CM | POA: Diagnosis present

## 2016-11-19 DIAGNOSIS — N3289 Other specified disorders of bladder: Secondary | ICD-10-CM | POA: Diagnosis not present

## 2016-11-19 DIAGNOSIS — I77819 Aortic ectasia, unspecified site: Secondary | ICD-10-CM | POA: Insufficient documentation

## 2016-11-19 DIAGNOSIS — H9190 Unspecified hearing loss, unspecified ear: Secondary | ICD-10-CM | POA: Diagnosis present

## 2016-11-19 DIAGNOSIS — R011 Cardiac murmur, unspecified: Secondary | ICD-10-CM | POA: Diagnosis present

## 2016-11-19 DIAGNOSIS — Z7984 Long term (current) use of oral hypoglycemic drugs: Secondary | ICD-10-CM

## 2016-11-19 DIAGNOSIS — R402144 Coma scale, eyes open, spontaneous, 24 hours or more after hospital admission: Secondary | ICD-10-CM | POA: Diagnosis not present

## 2016-11-19 DIAGNOSIS — E875 Hyperkalemia: Secondary | ICD-10-CM | POA: Diagnosis not present

## 2016-11-19 DIAGNOSIS — J90 Pleural effusion, not elsewhere classified: Secondary | ICD-10-CM | POA: Diagnosis not present

## 2016-11-19 DIAGNOSIS — Z841 Family history of disorders of kidney and ureter: Secondary | ICD-10-CM

## 2016-11-19 DIAGNOSIS — Q211 Atrial septal defect: Secondary | ICD-10-CM | POA: Insufficient documentation

## 2016-11-19 DIAGNOSIS — I7100 Dissection of unspecified site of aorta: Secondary | ICD-10-CM | POA: Diagnosis present

## 2016-11-19 DIAGNOSIS — E8770 Fluid overload, unspecified: Secondary | ICD-10-CM | POA: Diagnosis present

## 2016-11-19 DIAGNOSIS — I314 Cardiac tamponade: Secondary | ICD-10-CM | POA: Diagnosis not present

## 2016-11-19 DIAGNOSIS — N5089 Other specified disorders of the male genital organs: Secondary | ICD-10-CM | POA: Diagnosis present

## 2016-11-19 DIAGNOSIS — I4891 Unspecified atrial fibrillation: Secondary | ICD-10-CM | POA: Insufficient documentation

## 2016-11-19 DIAGNOSIS — I4892 Unspecified atrial flutter: Secondary | ICD-10-CM | POA: Diagnosis present

## 2016-11-19 DIAGNOSIS — E1140 Type 2 diabetes mellitus with diabetic neuropathy, unspecified: Secondary | ICD-10-CM | POA: Diagnosis present

## 2016-11-19 DIAGNOSIS — I480 Paroxysmal atrial fibrillation: Principal | ICD-10-CM | POA: Diagnosis present

## 2016-11-19 DIAGNOSIS — Z8249 Family history of ischemic heart disease and other diseases of the circulatory system: Secondary | ICD-10-CM

## 2016-11-19 DIAGNOSIS — R402364 Coma scale, best motor response, obeys commands, 24 hours or more after hospital admission: Secondary | ICD-10-CM | POA: Diagnosis not present

## 2016-11-19 DIAGNOSIS — I481 Persistent atrial fibrillation: Secondary | ICD-10-CM | POA: Diagnosis present

## 2016-11-19 DIAGNOSIS — Z9049 Acquired absence of other specified parts of digestive tract: Secondary | ICD-10-CM

## 2016-11-19 DIAGNOSIS — Z79899 Other long term (current) drug therapy: Secondary | ICD-10-CM

## 2016-11-19 DIAGNOSIS — Z7901 Long term (current) use of anticoagulants: Secondary | ICD-10-CM

## 2016-11-19 DIAGNOSIS — K567 Ileus, unspecified: Secondary | ICD-10-CM | POA: Diagnosis not present

## 2016-11-19 MED ORDER — MIDAZOLAM HCL 2 MG/2ML IJ SOLN
INTRAMUSCULAR | Status: AC
Start: 2016-11-19 — End: ?
  Filled 2016-11-19: qty 4

## 2016-11-19 MED ORDER — VH FENTANYL CITRATE 100 MCG/2 ML (NARRATOR)
INTRAMUSCULAR | Status: AC | PRN
Start: 2016-11-19 — End: 2016-11-19
  Administered 2016-11-19: 25 ug via INTRAVENOUS

## 2016-11-19 MED ORDER — BENZOCAINE 20% MT SOLN (WRAP)
OROMUCOSAL | Status: AC
Start: 2016-11-19 — End: ?
  Filled 2016-11-19: qty 0.28

## 2016-11-19 MED ORDER — LIDOCAINE VISCOUS 2 % MT SOLN
OROMUCOSAL | Status: AC
Start: 2016-11-19 — End: ?
  Filled 2016-11-19: qty 15

## 2016-11-19 MED ORDER — VH MIDAZOLAM HCL 5 MG/5 ML (NARRATOR)
INTRAMUSCULAR | Status: AC | PRN
Start: 2016-11-19 — End: 2016-11-19
  Administered 2016-11-19: 2 mg via INTRAVENOUS

## 2016-11-19 MED ORDER — FENTANYL CITRATE (PF) 50 MCG/ML IJ SOLN (WRAP)
INTRAMUSCULAR | Status: AC
Start: 2016-11-19 — End: ?
  Filled 2016-11-19: qty 2

## 2016-11-19 NOTE — Sedation Documentation (Signed)
Pt educated on TEE.  Understanding stated.  No concerns expressed.

## 2016-11-19 NOTE — Discharge Instructions (Signed)
Rest today.  Resume normal activity tomorrow.  No driving or operating hazardous machinery until tomorrow.

## 2016-11-22 ENCOUNTER — Inpatient Hospital Stay: Payer: Medicare Other | Admitting: Anesthesiology

## 2016-11-22 ENCOUNTER — Ambulatory Visit: Admit: 2016-11-22 | Payer: Self-pay | Admitting: Internal Medicine

## 2016-11-22 ENCOUNTER — Inpatient Hospital Stay
Admission: RE | Admit: 2016-11-22 | Discharge: 2016-12-09 | DRG: 228 | Disposition: A | Discharge status: Skilled Nursing Facility | Payer: Medicare Other | Attending: Specialist | Admitting: Specialist

## 2016-11-22 ENCOUNTER — Inpatient Hospital Stay: Payer: Medicare Other

## 2016-11-22 ENCOUNTER — Encounter
Admission: RE | Disposition: A | Discharge status: Skilled Nursing Facility | Payer: Self-pay | Source: Home / Self Care | Attending: Specialist

## 2016-11-22 ENCOUNTER — Inpatient Hospital Stay (HOSPITAL_COMMUNITY): Payer: Medicare Other | Admitting: Specialist

## 2016-11-22 DIAGNOSIS — R931 Abnormal findings on diagnostic imaging of heart and coronary circulation: Secondary | ICD-10-CM

## 2016-11-22 DIAGNOSIS — F319 Bipolar disorder, unspecified: Secondary | ICD-10-CM | POA: Diagnosis present

## 2016-11-22 DIAGNOSIS — I4891 Unspecified atrial fibrillation: Secondary | ICD-10-CM

## 2016-11-22 DIAGNOSIS — Z7901 Long term (current) use of anticoagulants: Secondary | ICD-10-CM

## 2016-11-22 DIAGNOSIS — E1140 Type 2 diabetes mellitus with diabetic neuropathy, unspecified: Secondary | ICD-10-CM | POA: Diagnosis present

## 2016-11-22 DIAGNOSIS — I501 Left ventricular failure: Secondary | ICD-10-CM

## 2016-11-22 DIAGNOSIS — K560 Paralytic ileus: Secondary | ICD-10-CM | POA: Diagnosis not present

## 2016-11-22 DIAGNOSIS — N400 Enlarged prostate without lower urinary tract symptoms: Secondary | ICD-10-CM | POA: Diagnosis present

## 2016-11-22 DIAGNOSIS — R9430 Abnormal result of cardiovascular function study, unspecified: Secondary | ICD-10-CM

## 2016-11-22 DIAGNOSIS — I481 Persistent atrial fibrillation: Secondary | ICD-10-CM

## 2016-11-22 DIAGNOSIS — N189 Chronic kidney disease, unspecified: Secondary | ICD-10-CM | POA: Diagnosis present

## 2016-11-22 DIAGNOSIS — E785 Hyperlipidemia, unspecified: Secondary | ICD-10-CM | POA: Diagnosis present

## 2016-11-22 DIAGNOSIS — N184 Chronic kidney disease, stage 4 (severe): Secondary | ICD-10-CM | POA: Insufficient documentation

## 2016-11-22 DIAGNOSIS — J449 Chronic obstructive pulmonary disease, unspecified: Secondary | ICD-10-CM | POA: Diagnosis present

## 2016-11-22 DIAGNOSIS — E119 Type 2 diabetes mellitus without complications: Secondary | ICD-10-CM | POA: Diagnosis present

## 2016-11-22 DIAGNOSIS — I639 Cerebral infarction, unspecified: Secondary | ICD-10-CM | POA: Clinically undetermined

## 2016-11-22 DIAGNOSIS — I10 Essential (primary) hypertension: Secondary | ICD-10-CM | POA: Diagnosis present

## 2016-11-22 DIAGNOSIS — I71019 Dissection of thoracic aorta, unspecified: Secondary | ICD-10-CM

## 2016-11-22 DIAGNOSIS — I482 Chronic atrial fibrillation, unspecified: Secondary | ICD-10-CM | POA: Insufficient documentation

## 2016-11-22 DIAGNOSIS — Z87891 Personal history of nicotine dependence: Secondary | ICD-10-CM

## 2016-11-22 HISTORY — PX: FLUORO-NO CHARGE: IMG2993

## 2016-11-22 LAB — I-STAT ACT KAOLIN
ACT POCT: 136 s
ACT POCT: 186 s
ACT POCT: 235 s
ACT POCT: 296 s
ACT POCT: 301 s
ACT POCT: 131 s
ACT POCT: 120 s

## 2016-11-22 LAB — VH DEXTROSE STICK GLUCOSE
Glucose POCT: 258 mg/dL — ABNORMAL HIGH (ref 71–99)
Glucose POCT: 230 mg/dL — ABNORMAL HIGH (ref 71–99)
Glucose POCT: 207 mg/dL — ABNORMAL HIGH (ref 71–99)
Glucose POCT: 168 mg/dL — ABNORMAL HIGH (ref 71–99)

## 2016-11-22 LAB — CBC
Hematocrit: 38.2 % — ABNORMAL LOW (ref 39.0–52.5)
Hematocrit: 34.1 % — ABNORMAL LOW (ref 39.0–52.5)
Hemoglobin: 12.6 gm/dL — ABNORMAL LOW (ref 13.0–17.5)
Hemoglobin: 11.2 gm/dL — ABNORMAL LOW (ref 13.0–17.5)
MCH: 31 pg (ref 28–35)
MCH: 31 pg (ref 28–35)
MCHC: 33 gm/dL (ref 32–36)
MCHC: 33 gm/dL (ref 32–36)
MCV: 93 fL (ref 80–100)
MCV: 93 fL (ref 80–100)
MPV: 6.7 fL (ref 6.0–10.0)
MPV: 7 fL (ref 6.0–10.0)
PLT CT: 221 10*3/uL (ref 130–440)
PLT CT: 190 10*3/uL (ref 130–440)
RBC: 4.1 10*6/uL (ref 4.00–5.70)
RBC: 3.67 10*6/uL — ABNORMAL LOW (ref 4.00–5.70)
RDW: 16.2 % — ABNORMAL HIGH (ref 11.0–14.0)
RDW: 16.1 % — ABNORMAL HIGH (ref 11.0–14.0)
WBC: 15 10*3/uL — ABNORMAL HIGH (ref 4.0–11.0)
WBC: 14.1 10*3/uL — ABNORMAL HIGH (ref 4.0–11.0)

## 2016-11-22 LAB — VH I-STAT CG8 PLUS
BE, ISTAT: 1 mMol/L (ref <=2)
Calcium Ionized I-Stat: 4.7 mg/dL (ref 4.35–5.10)
Glucose I-Stat: 146 mg/dL — ABNORMAL HIGH (ref 71–99)
HCO3, ISTAT: 25.1 mMol/L (ref 20.0–29.0)
Hematocrit I-Stat: 33 % — ABNORMAL LOW (ref 39.0–52.5)
Hemoglobin I-Stat: 11.2 gm/dL — ABNORMAL LOW (ref 13.0–17.5)
O2 Sat, %, ISTAT: 100 % (ref 96–100)
PCO2, ISTAT: 38.4 mm Hg (ref 35.0–45.0)
PO2, ISTAT: 174 mm Hg — ABNORMAL HIGH (ref 75–100)
Potassium I-Stat: 3.6 mMol/L (ref 3.5–5.3)
Sodium I-Stat: 143 mMol/L (ref 136–147)
TCO2 I-Stat: 26 mMol/L (ref 24–29)
i-STAT FIO2: 50 %
pH, ISTAT: 7.42 (ref 7.35–7.45)

## 2016-11-22 LAB — BASIC METABOLIC PANEL
Anion Gap: 12.1 mMol/L (ref 7.0–18.0)
BUN / Creatinine Ratio: 16.5 Ratio (ref 10.0–30.0)
BUN: 26 mg/dL — ABNORMAL HIGH (ref 7–22)
CO2: 23.3 mMol/L (ref 20.0–30.0)
Calcium: 8.3 mg/dL — ABNORMAL LOW (ref 8.5–10.5)
Chloride: 109 mMol/L (ref 98–110)
Creatinine: 1.58 mg/dL — ABNORMAL HIGH (ref 0.80–1.30)
EGFR: 45 mL/min/{1.73_m2} — ABNORMAL LOW (ref 60–150)
Glucose: 245 mg/dL — ABNORMAL HIGH (ref 71–99)
Osmolality Calc: 292 mOsm/kg (ref 275–300)
Potassium: 4.4 mMol/L (ref 3.5–5.3)
Sodium: 140 mMol/L (ref 136–147)

## 2016-11-22 LAB — PT AND APTT
PT INR: 1.3 (ref 0.5–1.3)
PT: 13.1 s — ABNORMAL HIGH (ref 9.5–11.5)
aPTT: 33.5 s (ref 24.0–34.0)

## 2016-11-22 LAB — FIBRINOGEN: Fibrinogen: 159 mg/dL — ABNORMAL LOW (ref 181–469)

## 2016-11-22 LAB — PREPARE RBC: Quantity: 2

## 2016-11-22 SURGERY — ABLATION - ATRIAL FIBRILLATION
Anesthesia: Anesthesia General

## 2016-11-22 SURGERY — ABLATION - ATRIAL FIBRILLATION
Anesthesia: Anesthesia General | Site: Chest | Wound class: Clean

## 2016-11-22 MED ORDER — PANTOPRAZOLE SODIUM 40 MG PO TBEC
40.0000 mg | DELAYED_RELEASE_TABLET | Freq: Two times a day (BID) | ORAL | Status: DC
Start: 2016-11-22 — End: 2016-12-09
  Administered 2016-11-23 – 2016-12-09 (×33): 40 mg via ORAL
  Filled 2016-11-22 (×37): qty 1

## 2016-11-22 MED ORDER — HEPARIN (PORCINE) IN NACL 2-0.9 UNIT/ML-% IJ SOLN
INTRAMUSCULAR | Status: AC
Start: 2016-11-22 — End: 2016-11-22
  Filled 2016-11-22: qty 1000

## 2016-11-22 MED ORDER — EPHEDRINE SULFATE 50 MG/ML IJ/IV SOLN (WRAP)
Status: DC | PRN
Start: 2016-11-22 — End: 2016-11-22
  Administered 2016-11-22 (×2): 25 mg via INTRAVENOUS

## 2016-11-22 MED ORDER — MORPHINE SULFATE 4 MG/ML IJ/IV SOLN (WRAP)
4.0000 mg | Status: DC | PRN
Start: 2016-11-22 — End: 2016-11-23
  Administered 2016-11-22 – 2016-11-23 (×2): 4 mg via INTRAVENOUS
  Filled 2016-11-22 (×3): qty 1

## 2016-11-22 MED ORDER — PROTAMINE SULFATE 10 MG/ML IV SOLN
INTRAVENOUS | Status: DC
Start: 2016-11-22 — End: 2016-11-22
  Filled 2016-11-22: qty 5

## 2016-11-22 MED ORDER — COLCHICINE 0.6 MG PO TABS
0.6000 mg | ORAL_TABLET | Freq: Two times a day (BID) | ORAL | Status: DC
Start: 2016-11-22 — End: 2016-11-24
  Administered 2016-11-23 – 2016-11-24 (×3): 0.6 mg via ORAL
  Filled 2016-11-22 (×8): qty 1

## 2016-11-22 MED ORDER — ROCURONIUM BROMIDE 50 MG/5ML IV SOLN
INTRAVENOUS | Status: DC | PRN
Start: 2016-11-22 — End: 2016-11-22
  Administered 2016-11-22: 50 mg via INTRAVENOUS
  Administered 2016-11-22: 10 mg via INTRAVENOUS

## 2016-11-22 MED ORDER — VH INSULIN (REGULAR) INFUSION 250 UNIT/250 ML (SIMPLE)
0.5000 [IU]/h | Status: DC
Start: 2016-11-22 — End: 2016-11-23
  Administered 2016-11-22: 20:00:00 9.5 [IU]/h via INTRAVENOUS
  Filled 2016-11-22: qty 250

## 2016-11-22 MED ORDER — HEPARIN SODIUM (PORCINE) 1000 UNIT/ML IJ SOLN
INTRAMUSCULAR | Status: AC
Start: 2016-11-22 — End: 2016-11-22
  Filled 2016-11-22: qty 20

## 2016-11-22 MED ORDER — IODIXANOL 320 MG/ML IV SOLN
60.0000 mL | Freq: Once | INTRAVENOUS | Status: AC
Start: 2016-11-22 — End: 2016-11-22
  Administered 2016-11-22: 15:00:00 60 mL via INTRAVENOUS

## 2016-11-22 MED ORDER — AMIODARONE HCL 200 MG PO TABS
200.0000 mg | ORAL_TABLET | Freq: Two times a day (BID) | ORAL | Status: DC
Start: 2016-11-22 — End: 2016-11-29
  Administered 2016-11-22 – 2016-11-28 (×12): 200 mg via ORAL
  Filled 2016-11-22 (×14): qty 1

## 2016-11-22 MED ORDER — VH PHENYLEPHRINE 30 MG IN NS 250 ML INFUSION (SIMPLE)
INTRAVENOUS | Status: AC
Start: 2016-11-22 — End: ?
  Filled 2016-11-22: qty 250

## 2016-11-22 MED ORDER — PROCHLORPERAZINE EDISYLATE 5 MG/ML IJ SOLN
INTRAMUSCULAR | Status: DC | PRN
Start: 2016-11-22 — End: 2016-11-22
  Administered 2016-11-22: 10 mg via INTRAVENOUS

## 2016-11-22 MED ORDER — CEFAZOLIN SODIUM-DEXTROSE 1-4 GM-% IV SOLR
2.0000 g | Freq: Once | INTRAVENOUS | Status: AC
Start: 2016-11-22 — End: 2016-11-22
  Administered 2016-11-22: 09:00:00 2 g via INTRAVENOUS
  Filled 2016-11-22 (×2): qty 100

## 2016-11-22 MED ORDER — ALBUTEROL SULFATE (2.5 MG/3ML) 0.083% IN NEBU
2.5000 mg | INHALATION_SOLUTION | RESPIRATORY_TRACT | Status: DC | PRN
Start: 2016-11-22 — End: 2016-12-09

## 2016-11-22 MED ORDER — LIDOCAINE HCL (PF) 2 % IJ SOLN
INTRAMUSCULAR | Status: AC
Start: 2016-11-22 — End: ?
  Filled 2016-11-22: qty 10

## 2016-11-22 MED ORDER — PROPOFOL 200 MG/20ML IV EMUL
INTRAVENOUS | Status: DC | PRN
Start: 2016-11-22 — End: 2016-11-22
  Administered 2016-11-22: 150 mg via INTRAVENOUS
  Administered 2016-11-22: 50 mg via INTRAVENOUS

## 2016-11-22 MED ORDER — ROCURONIUM BROMIDE 50 MG/5ML IV SOLN
INTRAVENOUS | Status: AC
Start: 2016-11-22 — End: ?
  Filled 2016-11-22: qty 10

## 2016-11-22 MED ORDER — DEXAMETHASONE SODIUM PHOSPHATE 4 MG/ML IJ SOLN
INTRAMUSCULAR | Status: DC | PRN
Start: 2016-11-22 — End: 2016-11-22
  Administered 2016-11-22: 8 mg via INTRAVENOUS

## 2016-11-22 MED ORDER — PROCHLORPERAZINE EDISYLATE 5 MG/ML IJ SOLN
INTRAMUSCULAR | Status: AC
Start: 2016-11-22 — End: 2016-11-22
  Filled 2016-11-22: qty 2

## 2016-11-22 MED ORDER — VH PHENYLEPHRINE 120 MCG/ML IV BOLUS (ANESTHESIA)
PREFILLED_SYRINGE | INTRAVENOUS | Status: AC
Start: 2016-11-22 — End: ?
  Filled 2016-11-22: qty 20

## 2016-11-22 MED ORDER — HEPARIN SODIUM (PORCINE) 1000 UNIT/ML IJ SOLN
INTRAMUSCULAR | Status: DC | PRN
Start: 2016-11-22 — End: 2016-11-22
  Administered 2016-11-22: 5000 [IU] via INTRAVENOUS
  Administered 2016-11-22: 3000 [IU] via INTRAVENOUS
  Administered 2016-11-22: 8000 [IU] via INTRAVENOUS
  Administered 2016-11-22: 4000 [IU] via INTRAVENOUS

## 2016-11-22 MED ORDER — LACTATED RINGERS IV SOLN
INTRAVENOUS | Status: DC | PRN
Start: 2016-11-22 — End: 2016-11-22

## 2016-11-22 MED ORDER — ACETAMINOPHEN 10 MG/ML IV SOLN
INTRAVENOUS | Status: DC | PRN
Start: 2016-11-22 — End: 2016-11-22
  Administered 2016-11-22: 1000 mg via INTRAVENOUS

## 2016-11-22 MED ORDER — PROMETHAZINE HCL 25 MG PO TABS
25.0000 mg | ORAL_TABLET | Freq: Four times a day (QID) | ORAL | Status: DC | PRN
Start: 2016-11-22 — End: 2016-12-09
  Administered 2016-11-28 – 2016-11-29 (×2): 25 mg via ORAL
  Filled 2016-11-22 (×2): qty 1

## 2016-11-22 MED ORDER — METHYLPREDNISOLONE SODIUM SUCC 125 MG IJ SOLR
80.0000 mg | Freq: Once | INTRAMUSCULAR | Status: AC
Start: 2016-11-22 — End: 2016-11-22
  Administered 2016-11-22: 11:00:00 80 mg via INTRAVENOUS
  Filled 2016-11-22: qty 2

## 2016-11-22 MED ORDER — VH HEPARIN SODIUM (PORCINE) 10000 UNIT/ML IJ SOLN (INITIAL BOLUS)
4000.0000 [IU] | Freq: Once | INTRAMUSCULAR | Status: DC
Start: 2016-11-22 — End: 2016-11-22
  Filled 2016-11-22: qty 0.4

## 2016-11-22 MED ORDER — LIDOCAINE HCL (PF) 1 % IJ SOLN
INTRAMUSCULAR | Status: AC
Start: 2016-11-22 — End: 2016-11-22
  Filled 2016-11-22: qty 30

## 2016-11-22 MED ORDER — ONDANSETRON 4 MG PO TBDP
4.0000 mg | ORAL_TABLET | Freq: Three times a day (TID) | ORAL | Status: DC | PRN
Start: 2016-11-22 — End: 2016-12-09
  Administered 2016-11-24: 23:00:00 4 mg via ORAL
  Filled 2016-11-22: qty 1

## 2016-11-22 MED ORDER — FENTANYL CITRATE (PF) 50 MCG/ML IJ SOLN (WRAP)
50.0000 ug | INTRAMUSCULAR | Status: DC | PRN
Start: 2016-11-22 — End: 2016-11-23
  Administered 2016-11-22: 50 ug via INTRAVENOUS
  Filled 2016-11-22: qty 2

## 2016-11-22 MED ORDER — PROPOFOL 200 MG/20ML IV EMUL
INTRAVENOUS | Status: AC
Start: 2016-11-22 — End: ?
  Filled 2016-11-22: qty 20

## 2016-11-22 MED ORDER — FAMOTIDINE PREMIXED 20-0.9 MG/50ML-% IV SOLN
20.0000 mg | Freq: Two times a day (BID) | INTRAVENOUS | Status: DC
Start: 2016-11-22 — End: 2016-11-23
  Administered 2016-11-22: 22:00:00 20 mg via INTRAVENOUS
  Filled 2016-11-22 (×2): qty 50

## 2016-11-22 MED ORDER — METHYLPREDNISOLONE SODIUM SUCC 125 MG IJ SOLR
INTRAMUSCULAR | Status: DC | PRN
Start: 2016-11-22 — End: 2016-11-22
  Administered 2016-11-22: 80 mg

## 2016-11-22 MED ORDER — ONDANSETRON HCL 4 MG/2ML IJ SOLN
4.0000 mg | Freq: Three times a day (TID) | INTRAMUSCULAR | Status: DC | PRN
Start: 2016-11-22 — End: 2016-12-09
  Administered 2016-11-22 – 2016-11-29 (×6): 4 mg via INTRAVENOUS
  Filled 2016-11-22 (×6): qty 2

## 2016-11-22 MED ORDER — MORPHINE SULFATE 4 MG/ML IJ/IV SOLN (WRAP)
4.0000 mg | Freq: Once | Status: AC
Start: 2016-11-22 — End: 2016-11-22
  Administered 2016-11-22: 16:00:00 4 mg via INTRAVENOUS

## 2016-11-22 MED ORDER — VH PROMETHAZINE HCL 25 MG/ML IM SOLN
6.2500 mg | Freq: Four times a day (QID) | INTRAMUSCULAR | Status: DC | PRN
Start: 2016-11-22 — End: 2016-12-09
  Administered 2016-11-22 – 2016-11-28 (×4): 6.25 mg via INTRAMUSCULAR
  Filled 2016-11-22 (×5): qty 1

## 2016-11-22 MED ORDER — ACETAMINOPHEN 10 MG/ML IV SOLN
INTRAVENOUS | Status: AC
Start: 2016-11-22 — End: ?
  Filled 2016-11-22: qty 100

## 2016-11-22 MED ORDER — HEPARIN (PORCINE) IN D5W 50-5 UNIT/ML-% IV SOLN
INTRAVENOUS | Status: AC
Start: 2016-11-22 — End: 2016-11-22
  Filled 2016-11-22: qty 500

## 2016-11-22 MED ORDER — VH INSULIN (REGULAR) INFUSION 250 UNIT/250 ML (SIMPLE)
Status: AC
Start: 2016-11-22 — End: ?
  Filled 2016-11-22: qty 250

## 2016-11-22 MED ORDER — DEXAMETHASONE SODIUM PHOSPHATE 4 MG/ML IJ SOLN
INTRAMUSCULAR | Status: AC
Start: 2016-11-22 — End: ?
  Filled 2016-11-22: qty 2

## 2016-11-22 MED ORDER — ONDANSETRON HCL 4 MG/2ML IJ SOLN
INTRAMUSCULAR | Status: AC
Start: 2016-11-22 — End: 2016-11-23
  Filled 2016-11-22: qty 2

## 2016-11-22 MED ORDER — HALOPERIDOL LACTATE 5 MG/ML IJ SOLN
INTRAMUSCULAR | Status: DC | PRN
Start: 2016-11-22 — End: 2016-11-22
  Administered 2016-11-22: 1 mg via INTRAVENOUS

## 2016-11-22 MED ORDER — LIDOCAINE HCL (PF) 2 % IJ SOLN
INTRAMUSCULAR | Status: DC | PRN
Start: 2016-11-22 — End: 2016-11-22
  Administered 2016-11-22: 100 mg via INTRAVENOUS

## 2016-11-22 MED ORDER — EPHEDRINE SULFATE 50 MG/ML IJ/IV SOLN (WRAP)
Status: AC
Start: 2016-11-22 — End: ?
  Filled 2016-11-22: qty 1

## 2016-11-22 MED ORDER — PROMETHAZINE HCL 12.5 MG RE SUPP
12.5000 mg | Freq: Four times a day (QID) | RECTAL | Status: DC | PRN
Start: 2016-11-22 — End: 2016-12-09
  Administered 2016-11-28: 12.5 mg via RECTAL
  Filled 2016-11-22 (×3): qty 1

## 2016-11-22 MED ORDER — VH NOREPINEPHRINE INFUSION 8 MG/250 ML D5W (SIMPLE)
INTRAVENOUS | Status: DC | PRN
Start: 2016-11-22 — End: 2016-11-22
  Administered 2016-11-22: 4 ug/min via INTRAVENOUS

## 2016-11-22 MED ORDER — SODIUM CHLORIDE 0.9 % IV SOLN
INTRAVENOUS | Status: DC
Start: 2016-11-22 — End: 2016-11-23

## 2016-11-22 MED ORDER — PROTAMINE SULFATE 10 MG/ML IV SOLN
INTRAVENOUS | Status: DC | PRN
Start: 2016-11-22 — End: 2016-11-22
  Administered 2016-11-22: 60 mg via INTRAVENOUS

## 2016-11-22 MED ORDER — AMIODARONE HCL 200 MG PO TABS
200.0000 mg | ORAL_TABLET | Freq: Two times a day (BID) | ORAL | Status: DC
Start: 2016-11-22 — End: 2016-11-22
  Filled 2016-11-22: qty 1

## 2016-11-22 MED ORDER — ASPIRIN 81 MG PO CHEW
81.0000 mg | CHEWABLE_TABLET | Freq: Every day | ORAL | Status: DC
Start: 2016-11-23 — End: 2016-11-23
  Administered 2016-11-23: 11:00:00 81 mg via ORAL
  Filled 2016-11-22: qty 1

## 2016-11-22 MED ORDER — DEXTROSE 10 % IV BOLUS
250.0000 mL | INTRAVENOUS | Status: DC | PRN
Start: 2016-11-22 — End: 2016-11-23

## 2016-11-22 MED ORDER — FENTANYL CITRATE (PF) 50 MCG/ML IJ SOLN (WRAP)
INTRAMUSCULAR | Status: DC | PRN
Start: 2016-11-22 — End: 2016-11-22
  Administered 2016-11-22: 150 ug via INTRAVENOUS
  Administered 2016-11-22: 100 ug via INTRAVENOUS
  Administered 2016-11-22 (×4): 50 ug via INTRAVENOUS

## 2016-11-22 MED ORDER — DEXAMETHASONE SODIUM PHOSPHATE 4 MG/ML IJ SOLN (WRAP)
5.0000 mg | Freq: Once | INTRAMUSCULAR | Status: AC
Start: 2016-11-22 — End: 2016-11-22
  Administered 2016-11-22: 17:00:00 5 mg via INTRAVENOUS
  Filled 2016-11-22: qty 2

## 2016-11-22 MED ORDER — ONDANSETRON HCL 4 MG/2ML IJ SOLN
INTRAMUSCULAR | Status: DC | PRN
Start: 2016-11-22 — End: 2016-11-22
  Administered 2016-11-22: 8 mg via INTRAVENOUS

## 2016-11-22 MED ORDER — VH HEPARIN 10,000 UNITS/1000 ML NS
INJECTION | INTRAVENOUS | Status: AC
Start: 2016-11-22 — End: 2016-11-22
  Filled 2016-11-22: qty 1000

## 2016-11-22 MED ORDER — GABAPENTIN 100 MG PO CAPS
100.0000 mg | ORAL_CAPSULE | Freq: Two times a day (BID) | ORAL | Status: DC
Start: 2016-11-22 — End: 2016-11-30
  Administered 2016-11-22 – 2016-11-30 (×16): 100 mg via ORAL
  Filled 2016-11-22 (×19): qty 1

## 2016-11-22 MED ORDER — VH PHENYLEPHRINE 120 MCG/ML IV BOLUS (ANESTHESIA)
PREFILLED_SYRINGE | INTRAVENOUS | Status: DC | PRN
Start: 2016-11-22 — End: 2016-11-22
  Administered 2016-11-22 (×3): 240 ug via INTRAVENOUS
  Administered 2016-11-22 (×2): 360 ug via INTRAVENOUS
  Administered 2016-11-22 (×2): 240 ug via INTRAVENOUS
  Administered 2016-11-22 (×2): 120 ug via INTRAVENOUS

## 2016-11-22 MED ORDER — INSULIN REGULAR HUMAN 100 UNIT/ML IJ SOLN
1.0000 [IU] | INTRAMUSCULAR | Status: DC | PRN
Start: 2016-11-22 — End: 2016-11-23

## 2016-11-22 MED ORDER — VH HEPARIN SODIUM (PORCINE) 10000 UNIT/ML IJ SOLN (REBOLUS)
2000.0000 [IU] | Freq: Four times a day (QID) | INTRAMUSCULAR | Status: DC | PRN
Start: 2016-11-22 — End: 2016-11-22
  Filled 2016-11-22: qty 0.4

## 2016-11-22 MED ORDER — VH NOREPINEPHRINE INFUSION 8 MG/250 ML D5W (SIMPLE)
INTRAVENOUS | Status: AC
Start: 2016-11-22 — End: ?
  Filled 2016-11-22: qty 250

## 2016-11-22 MED ORDER — AMLODIPINE BESYLATE 5 MG PO TABS
10.0000 mg | ORAL_TABLET | Freq: Every day | ORAL | Status: DC
Start: 2016-11-23 — End: 2016-11-25
  Administered 2016-11-23 – 2016-11-24 (×3): 10 mg via ORAL
  Filled 2016-11-22 (×4): qty 2

## 2016-11-22 MED ORDER — FENTANYL CITRATE (PF) 50 MCG/ML IJ SOLN (WRAP)
INTRAMUSCULAR | Status: AC
Start: 2016-11-22 — End: ?
  Filled 2016-11-22: qty 5

## 2016-11-22 MED ORDER — DIVALPROEX SODIUM 500 MG PO TBEC
500.0000 mg | DELAYED_RELEASE_TABLET | Freq: Two times a day (BID) | ORAL | Status: DC
Start: 2016-11-22 — End: 2016-12-09
  Administered 2016-11-22 – 2016-12-09 (×34): 500 mg via ORAL
  Filled 2016-11-22 (×34): qty 1

## 2016-11-22 MED ORDER — MIDAZOLAM HCL 5 MG/5ML IJ SOLN
INTRAMUSCULAR | Status: AC
Start: 2016-11-22 — End: ?
  Filled 2016-11-22: qty 5

## 2016-11-22 MED ORDER — MIDAZOLAM HCL 5 MG/5ML IJ SOLN
INTRAMUSCULAR | Status: DC | PRN
Start: 2016-11-22 — End: 2016-11-22
  Administered 2016-11-22: 2 mg via INTRAVENOUS
  Administered 2016-11-22: 1 mg via INTRAVENOUS
  Administered 2016-11-22: 2 mg via INTRAVENOUS

## 2016-11-22 MED ORDER — ALBUTEROL-IPRATROPIUM 2.5-0.5 (3) MG/3ML IN SOLN
3.0000 mL | Freq: Four times a day (QID) | RESPIRATORY_TRACT | Status: DC
Start: 2016-11-22 — End: 2016-11-24
  Administered 2016-11-22 – 2016-11-24 (×6): 3 mL via RESPIRATORY_TRACT
  Filled 2016-11-22 (×11): qty 3

## 2016-11-22 MED ORDER — VH HEPARIN (PORCINE) IN D5W 50-5 UNIT/ML-% IV SOLN
0.0000 [IU]/h | INTRAVENOUS | Status: DC
Start: 2016-11-22 — End: 2016-11-22
  Administered 2016-11-22: 13:00:00 18000 [IU]/h via INTRAVENOUS

## 2016-11-22 SURGICAL SUPPLY — 8 items
ADHESIVE DERMABOND HIVIC PEN (Supply) ×3 IMPLANT
COAGULATION RF CABLE ×3 IMPLANT
DRSG TEGADERM CHG IVDRSG (Dressings) ×3 IMPLANT
EPI SENSE COAGULATION SYSTEM  3CM ×3 IMPLANT
SPONGE SOFTWICK DRAIN (Dressings) ×3 IMPLANT
STAPLER SKIN DISP35WIDE#PXW35 (Supply) ×3 IMPLANT
SUBTLE CANNULA WITH GUIDE 30CM ×3 IMPLANT
TAPE MEDIPORE 4"X10YDS CLOTH (Supply) ×3 IMPLANT

## 2016-11-22 NOTE — Progress Notes (Signed)
[name] in to see patient.  Aware of current VS and current CT drainage totalling 870ml.  No new orders.

## 2016-11-22 NOTE — Progress Notes (Signed)
Patient woke suddenly at 1630 with N/V, dry heaving.  SBP up to 180s.  Right groin site bleeding with hematoma.  Manual pressure applied for 15 min with good hemostatus.  Redressed.  Right IJ continues to ooze with surgicele and gauze/tape pressure dressing applied.

## 2016-11-22 NOTE — Anesthesia Procedure Notes (Signed)
TEE      Performed by: Joylene John    Authorized by: Joylene John       Procedure Info    Indication:  Myocardial Function, Valve Function and Other(comment)  Surgery Class:  Other  Examiner:  Joylene John  History of Esophageal Stricture?: No    History of Radiation Therapy?: No    Hemoptysis?: No    Probe Placement:  Atraumatic  TEE Attestation:  DIAGNOSTIC This TEE is for diagnostic purposes.  Pelase refer to written report for details regarding the exam    Aorta      Pericardium    Pericardial Effusion:  None    Left Ventricle    LV Thrombus:  None  LV Septum:  Normal  Pre-surgery RWM Anterior wall:  Normal  Pre-surgery RWM Lateral Wall:  Normal  Pre-surgery RWM Septum:  Severe HK  Pre-surgery RWM Inferior Wall:  Normal    Left Atrium    LA Appendage:  Normal  LA Comments:  No clot in LAA    Right Ventricle    RV Function:  Normal    Right Atrium      Aortic Valve    Native AV Type:  Bicuspid  Native AV:  Central Regurg    AS Grade:  Mild  AR Grade:  2+    Mitral Valve    Native Mitral Valve Type:  Normal  Native Mitral Valve:  Central Regurg    MS Grade:  None  MR Grade:  1+ and 2+    Tricuspid Valve      Intracardiac Masses

## 2016-11-22 NOTE — H&P (Signed)
[facility]  CARDIOTHORACIC H&P      Patient: [name]  Date: 11/22/2016   DOB: 1951-06-05  Primary Care : [name], MD   MRN: [id number]       Chief Complaint: SOB, afib     History Gathered From: Self    HISTORY AND PHYSICAL     [name] is a 66 y.o. male with a PMHx of persistent afib, hx of cardioversion without success, EF 45% FEV1 45% admitted for afib ablation. No new changes from last visist    Past Medical History:   Diagnosis Date   . Arrhythmia    . Arthritis    . Atrial fibrillation    . Atrial flutter    . Bipolar affective    . BPH (benign prostatic hyperplasia)    . Complication of anesthesia     resp. asessment   . Diabetes mellitus    . Diabetic neuropathy    . Gout    . Heart murmur    . HOH (hard of hearing)    . Hyperlipidemia    . Hypertension    . Paroxysmal atrial fibrillation    . Pulmonary hypertension    . Renal insufficiency    . Type 2 diabetes mellitus, controlled    . Wears glasses        Past Surgical History:   Procedure Laterality Date   . APPENDECTOMY     . CARDIAC ABLATION     . CARDIAC CATHETERIZATION     . CARDIOVERSION      x 2   . HERNIA REPAIR     . TEE     . TONSILLECTOMY     . TONSILLECTOMY, ADENOIDECTOMY         Prior to Admission medications    Medication Sig Start Date End Date Taking? Authorizing Provider   allopurinol (ZYLOPRIM) 300 MG tablet Take 300 mg by mouth daily.   Yes [provider]   amiodarone (PACERONE) 200 MG tablet Take 200 mg by mouth daily.   Yes [provider]   atenolol (TENORMIN) 100 MG tablet Take 100 mg by mouth 2 (two) times daily.     09/10/16  Yes [provider]   dilTIAZem (CARTIA XT) 240 MG 24 hr capsule Take 240 mg by mouth daily.   Yes [provider]   divalproex EC/DR (DEPAKOTE EC/DR) 500 MG EC tablet Take 500 mg by mouth 2 (two) times daily.   Yes [provider]   furosemide (LASIX) 40 MG tablet Take 40 mg by mouth daily.      07/16/16  Yes [provider]   furosemide (LASIX) 80 MG tablet Take 80 mg by mouth daily.       Yes [provider]   gabapentin (NEURONTIN) 100 MG capsule Take 100 mg by mouth 2 (two) times daily.   Yes [provider]   glimepiride (AMARYL) 1 MG tablet Take 0.5 mg by mouth every morning before breakfast.       Yes [provider]   lisinopril (PRINIVIL,ZESTRIL) 10 MG tablet Take 10 mg by mouth daily.     08/24/16  Yes [provider]   loperamide (IMODIUM) 2 MG capsule Take 2 mg by mouth 2 (two) times daily as needed.       Yes [provider]   metFORMIN (GLUCOPHAGE) 1000 MG tablet Twice daily with food  06/11/16  Yes [provider]  minoxidil (LONITEN) 10 MG tablet Take 30 mg by mouth daily.   Yes [provider]   Multiple Vitamin (MULTIVITAMIN) capsule Take 1 capsule by mouth daily.   Yes [provider]   pravastatin (PRAVACHOL) 40 MG tablet Take 40 mg by mouth every evening.   Yes [provider]   rivaroxaban (XARELTO) 20 MG Tab Take 20 mg by mouth daily with dinner.   Yes [provider]   tamsulosin (FLOMAX) 0.4 MG Cap Take 0.4 mg by mouth daily.   Yes [provider]       Family History   Problem Relation Age of Onset   . Hypertension Mother    . Diabetes Mother    . Heart disease Mother    . Hypertension Sister    . Diabetes Sister    . Hyperlipidemia Sister    . Hypertension Brother    . Heart disease Brother    . Hyperlipidemia Brother    . Hypertension Brother    . Diabetes Brother    . Hyperlipidemia Brother    . Hypertension Daughter        Social History   Substance Use Topics   . Smoking status: Former Smoker     Packs/day: 0.30     Years: 40.00     Types: Cigarettes     Quit date: 1995   . Smokeless tobacco: Never Used      Comment: 1 pack would last a week   . Alcohol use No       REVIEW OF SYSTEMS     12 point review of systems negative or as per HPI and below endorsements.    SOB with  exertion, afib     PHYSICAL EXAM     GENERAL:  Well developed male in chronic afib   VITAL SIGNS: BP 130/81   Pulse 79   Temp 97 F (36.1 C) (Tympanic)   Resp 16   Ht 1.702 m (5\' 7" )   Wt 85.6 kg (188 lb 11.4 oz)   SpO2 91%   BMI 29.56 kg/m     SKIN: Warm and dry. There are no lesions. NO diaphoresis.    EYES: Pupils equal and reactive to light. Extraocular muscles intact and full. Anicteric sclera. Conjunctiva clear. Lids, full range of motion.     HEAD: . No obvious loose teeth or draining teeth or lesions. NO hard or soft palate lesions. NO oral mucosa lesions.     NECK: NO jugular venous distension. There is no thyromegaly or cervical adenopathy.    LUNGS: Decreased BS bases  BREASTS:NA    CARDIOVASCULAR EXAM:afib     PERIPHERAL VASCULAR: 2+ equal and symmetrical pulses at the radial, ulnar, carotid, femoral, popliteal, PT and DP pulses bilaterally. There are no femoral or abdominal bruits.      Varicose Vein: Non noted    ABDOMEN: Benign, non tender    EXTREMITIES: withouut edema    NEUROPSYCHIATRIC EXAM: Patient is alert, oriented x3. Normal mood and affect. Cranial nerves 2-12 grossly intact. Motor strength 5/5 throughout without deficit on either left upper or lower extremity intact. Sensory intact to pinprick, light tough throughout.     LABS & IMAGING       Labs:    Recent Labs  Lab 11/15/16  1110   Sodium 142   Potassium 3.9   Chloride 104   CO2 31.2*   BUN 36*   Creatinine 2.05*   EGFR 33*   Calcium 9.6  Recent Labs  Lab 11/15/16  1110   WBC 6.8   RBC 4.44   Hemoglobin 13.4   Hematocrit 41.5   MCV 93   PLT CT 237       Recent Labs  Lab 11/15/16  1110   PT 17.6*   PT INR 1.7*           Recent Labs  Lab 11/15/16  1110   Bilirubin, Total 0.8   Protein, Total 7.2   Albumin 4.3   ALT 21   AST (SGOT) 19           Invalid input(s): FREET4    Imaging:  FLUORO-NO CHARGE (HYBRID ROOM ONLY)    (Results Pending)         ASSESSMENT & PLANS     This is a 66 y.o. male with chronic afib, creatinine 2.05  today, admitted for afib ablation procedure           Rosanne Sack, PA  11/22/2016 7:27 AM

## 2016-11-22 NOTE — H&P (Signed)
I have reviewed the H&P, examined the patient and there are no changes.  Plan for Hybrid AFib ablation (convergent procedure).  All risks, benefits and alternatives explained.   Consent obtained.      [name], MD, [facility], [facility]  Cardiothoracic Surgery  Pager: [phone]

## 2016-11-22 NOTE — Progress Notes (Signed)
Patient admitted to unit with EP lab staff and Dr. Georgeanna Lea.  CT hooked to suction with return of 561ml sanginous drainage.  Dr. Tarri Fuller at bedside and aware.

## 2016-11-22 NOTE — Anesthesia Postprocedure Evaluation (Signed)
ICU Post Operative Evaluation    Patient: [name]    Procedures performed: Procedure(s) with comments:  CONVERGENT ATRIAL FIBRILLATION ABLATION - HYBRID ATRIAL FIBRILLATION ABLATION  FLUORO-NO CHARGE (HYBRID ROOM ONLY)    Patient location: ICU      Post pain: None at this time     Mental Status:  Sedated but following commands    Respiratory Function: Stabel on facemask  Cardiovascular: Stable    Nausea/Vomiting: Adequately treated      Post assessment: No apparent anesthetic complications, no reportable events.

## 2016-11-22 NOTE — Consults (Signed)
Date: 11/22/2016  CRITICAL CARE CONSULTATION    REFERRING PHYSICIAN: [name].    HISTORY OF PRESENT ILLNESS: The patient is a 66 year old white  male with remote history of tobacco abuse of less than 20-pack  years duration, history of NIDDM, pulmonary hypertension, PAF,  hypertension, hyperlipidemia, loss of hearing, cardiac murmur,  bipolar disorder, atrial flutter and failed previous  cardioversion.  His estimated ejection fraction at baseline is  approximately 45%.  His preoperative pulmonary function test  revealed an FEV1 of 45% of predicted.  Today, the patient was  admitted for a hybrid convergent atrial fibrillation ablation.    PROCEDURE: He was taken to the OR, where he underwent partial  resection of the xiphoid bone followed by a standard ablation  procedure.  A Blake drain was placed into the pericardium and 5  cc of prednisone is inserted via the [place] drain.  The patient  was then taken to the hybrid laboratory where a cryoablation of  the pulmonary veins was performed.  He then underwent successful  cardioversion to a sinus rhythm, was extubated and transferred  to the intensive care unit for close postoperative management.  On arrival, the patient is noted to have significant nausea with  emesis and to have approximate 700 cc drainage of  serosanguineous and bloody material via his pericardial drain.    The patient is awake, mildly somnolent, but arousable to voice  and capable following complex commands.    PAST MEDICAL HISTORY: Positive remote history of tobacco abuse,  positive preoperative PFTs suggestive of moderately severe  obstructive airways disease, NIDDM, chronic kidney disease,  pulmonary hypertension, atrial fibrillation, hypertension,  hyperlipidemia, loss of hearing, cardiac murmur, gout, diabetic  neuropathy, BPH, bipolar disorder, and arthritis.  Status post  tonsillectomy, hernia repair, appendectomy, and apparently  failed cardiac ablation.    MEDICATIONS: At the time of  admission,    1.  Allopurinol 300 mg p.o. q.a.m.  2.  Amiodarone 200 mg p.o. q.a.m.  3.  Atenolol 100 mg p.o. b.i.d.  4.  Diltiazem XT 240 mg p.o. q.a.m.  5.  Depakote 500 mg p.o. b.i.d.  6.  Lasix 40 mg p.o. q.a.m.  7.  Lasix 80 mg p.o. q.p.m.  8.  Neurontin 100 mg p.o. b.i.d.  9.  Amaryl 0.5 mg p.o. q.a.m  10.  Lisinopril 10 mg p.o. q.a.m.  11.  Loperamide 2 mg p.o. b.i.d. p.r.n.  12.  Metformin 1000 mg p.o. b.i.d.  13.  Minoxidil 30 mg p.o. q.a.m.  14.  MVI 1 p.o. q.a.m.  15.  Pravachol 40 mg p.o. q.h.s.  16.  Xarelto 20 mg p.o. q.a.m.  17.  Flomax 0.4 mg p.o. q.a.m.    ALLERGIES/INTOLERANCES: To CODEINE, which caused nausea and  vomiting.    FAMILY HISTORY: Positive for NIDDM, hypertension, heart disease,  hyperlipidemia.    SOCIAL HISTORY: Positive for tobacco abuse of at least 1/3rd  pack per day for many years, no history of alcohol abuse.    REVIEW OF SYSTEMS: As per above from a critical care view point.    PHYSICAL EXAMINATION:  GENERAL:  Well-developed, thin, 66 year old, white male who is  extubated, somnolent, but arouses to voice with effective cough,  able to protect his airway, in no apparent respiratory distress.  VITAL SIGNS:  Temp is 97.3, pulse 62, respirations are 16, blood  pressure is 156/76.  HEENT:  Oral mucosa is moist.  No cervical adenopathy.  HEART:  Regular rate and rhythm.  S1, S2.  No S3, no S4.  Question 1/6 systolic ejection murmur best heard at the left  apex.  LUNGS:  Breath sounds are normal.  Few scattered rhonchi.  No  rales.  No wheezing.  THORAX:  No increased AP diameter.  Respiratory, accessory  muscle use.  No kyphoscoliosis.  Pericardial drain is drained  approximately 500 cc of serosanguineous and bloody fluid since  arrival from the OR.  ABDOMEN:  Bowel sounds are normal.  Abdomen is soft, mildly  distended, nontender.  No gross hepatosplenomegaly.  EXTREMITIES:  No cyanosis, clubbing, or edema.  NEUROLOGICAL:  The patient is somnolent, arouses to voice.  Follows  complex commands with no gross focal neurological  deficits noted.    LABORATORY DATA: Chest x-ray dated 11/22/2016, mild  cardiomegaly, prominent pulmonary arteries consistent with  pulmonary hypertension.  No pulmonary vascular congestion.  No  definite focal infiltrate.  The cardiac silhouette appears to be  unchanged compared to previous x-ray dated 11/15/2016.  12-lead  EKG dated 11/22/2016, sinus rhythm, heart rate is approximately  65, normal axis.  Nonspecific ST-T wave changes noted.  Left  ventricular hypertrophy.  CBC and diff, hemoglobin is 12.6,  hematocrit 38.2, white blood cell count 15.0, platelets are  221,000.  Glucose is 245, BUN is 26, creatinine is 1.58.  Sodium  is 140, potassium 4.4, chloride is 109, CO2 is 23, anion gap is  12.1, osmolality is 292.  Arterial blood gas #1, pH 7.42, pCO2  is 38, pO2 is 174, bicarb is 25.1, O2 sats 100% drawn on  non-rebreather face mask.  PT/INR is 13.1/1.3.  PTT is 33.5,  fibrinogen is 159.    IMPRESSION: A 66 year old white male with a history of multiple  medical problems including probable moderately severe  obstructive airways disease, noninsulin-dependent diabetes  mellitus, chronic kidney disease, atrial fibrillation,  hypertension, hyperlipidemia, cardiac murmur, diabetic  neuropathy, benign prostatic hypertrophy, bipolar disorder, who  underwent a hybrid convergent atrial ablation procedure earlier  today.  The patient required resection of portion of his xiphoid  bone.  The procedure itself was uncomplicated.  The patient was  extubated without difficulty.  On arrival to the intensive care  unit, a 500 cc bolus of fluid was drained from the mediastinal  via the pericardial drain.  It is unclear whether or not the collection of blood was related pooling during the ablation procedure or whether  it represented active ongoing bleeding.  There is no evidence of  pericardial tamponade via abnormal hemodynamics and there is no  evidence of an enlarging  cardiac silhouette on today's chest  x-ray.  Will proceed as follows:    1.  Will monitor pericardial fluid drainage closely.  2.  Repeat hemogram approximately 6 hours postop.  3.  For intractable nausea postop, will add IV Decadron      p.r.n. to Zofran.  4.  Begin DuoNeb q.6 h. and albuterol q.2 h. p.r.n.  5.  Begin deep vein thrombosis/pulmonary embolism      prophylaxis as per usual postoperative protocol.  6.  Further suggestions to follow.    TIME SPENT: A total of 50 minutes of critical care time was  expended in the care of this patient.        2150  DD: 11/22/2016 15:55:16  DT: 11/22/2016 17:38:10  JOB: [id number]

## 2016-11-22 NOTE — Plan of Care (Signed)
Problem: Compromised Tissue integrity  Goal: Damaged tissue is healing and protected  Outcome: Progressing   11/22/16 1724   Goal/Interventions addressed this shift   Damaged tissue is healing and protected  Monitor/assess Braden scale every shift;Reposition patient every 2 hours and as needed unless able to reposition self;Increase activity as tolerated/progressive mobility;Keep intact skin clean and dry;Use bath wipes, not soap and water, for daily bathing

## 2016-11-22 NOTE — Progress Notes (Signed)
Pt transferred to procedural room. Pt report given to Ander Purpura, RN. Pt in no acute distress at this time.

## 2016-11-22 NOTE — Progress Notes (Signed)
Pt took 100 mg atenolol @ 3:30 am today.

## 2016-11-22 NOTE — Progress Notes (Signed)
[facility] Physician - Brief Progress Note   PERMANENT   11/22/2016 23:33      Page, [place]      Judy Pimple.      Date of Service 11/22/2016 23:33      HPI/Events of Note HTN.   Pt is on atenolo, lisinopril and minoxidil   His Cr is 1.58 and HR 78      Will start amlodipine for now with f/u               Electronically Signed by: Marlowe Alt (MD) on 11/22/2016 23:34

## 2016-11-22 NOTE — Anesthesia Preprocedure Evaluation (Addendum)
Anesthesia Evaluation    AIRWAY    Mallampati: III    TM distance: >3 FB  Neck ROM: full  Mouth Opening:full   CARDIOVASCULAR    irregular and normal       DENTAL    no notable dental hx     PULMONARY    pulmonary exam normal     OTHER FINDINGS    bearded        Relevant Problems   No active problems are marked relevant to this note.       PSS Anesthesia Comments: Goes by [name]. Discussed GA with Ettube, swings in BP, chance of heart problem, nausea and/or sore throat        Anesthesia Plan    ASA 4     general                     intravenous induction   Detailed anesthesia plan: general endotracheal  Monitors/Adjuncts: arterial line and TEE    Post Op: ICU  Post op pain management: per surgeon    informed consent obtained    ECG reviewed  pertinent labs reviewed             Signed by: Joylene John 11/22/16 7:14 AM

## 2016-11-22 NOTE — Op Note (Addendum)
Operative Note    Date: 11/22/2016    Pre-operative Diagnosis:   1- long-standing persistent atrial fibrillation  2- CKD  3- Reduced LVEF    Post-operative Diagnosis: same    Operation:   1- Hybrid left atrial ablation using bipolar radiofrequency (convergent procedure)  2- Transesophageal echocardiogram    Surgeon: [name], MD    Assistants:  Heywood Bene, PA    Due to the complexity of the case a cardiac APC was utilized as a first Environmental consultant for the entire duration of the operation/procedure. Specifically, they assisted with conduit harvesting as well as surgical exposures, retraction, and suturing.    Anesthesia: General endotracheal anesthesia    Procedure Details   The patient was seen in the Holding Room. The risks, benefits, complications, treatment options, and expected outcomes were discussed with the patient. The possibilities of reaction to medication, pulmonary aspiration, perforation of viscus, bleeding, recurrent infection, the need for additional procedures, failure to diagnose a condition, and creating a complication requiring transfusion or operation were discussed with the patient. The patient concurred with the proposed plan, giving informed consent. The site of surgery properly noted/marked, identified and the procedure verified. A Time Out was held and the above information confirmed.    Standard monitoring lines and Foley catheter were placed. General anesthesia was induced. Esophageal temperature probe was placed at the appropriate depth with fluoro confirmation. The patient was prepped and draped in a sterile fashion. A subxiphoid 5 cm incision was then created and dissected down to the pericardium. Excision of the distal part of the xiphoid was required. The pericardium was then opened transversely. The large and the port was then inserted and navigated towards the back of the heart posterior to the left atrial wall. Visualization of both sets of right and left superior and  inferior pulmonary veins was achieved. Anatomy was delineated. We proceeded to perform our left atrial ablation lesion set from left superior pulmonary vein to the right pulmonary vein using approximately 10 lesions. Monitoring was performed of impedance and energy delivery on the system. We performed a second row of ablation lesions inferiorly connecting the floor line of the left atrium across from the right inferior pulmonary vein to the left inferior pulmonary vein. Further gap lesions were performed. Inspection of the back wall of the left atrium was then carried out and was found to be adequately addressed. We inserted a Blake drain into the pericardium and 5 mL of prednisone was inserted. The subcutaneous tissue and fascia were closed using running oh Varco suture in continuous fashion. 4-0 Monocryl was used for skin closure. Sterile dressings were placed. At the end of the operation, all sponge, instruments, and needle counts were correct. Patient at this point was then transferred to the elective physiology lab for the second portion of the procedure Dr. [name] using cryoablation of both sets of pulmonary veins.     Findings:   Enlarged left atrium.    Estimate Blood Loss: per nursing record  Drains: Blake drain pericardial  Total IV Fluids: per anesthesia record  Specimens: None  Implants: None  Complications:  None; patient tolerated the procedure well.  Disposition: ICU - intubated and hemodynamically stable.  Condition: stable    Attending Attestation: I was present and scrubbed for the entire procedure.      [name], MD, [facility], [facility]  Cardiothoracic Surgery  Pager: [phone]

## 2016-11-22 NOTE — Transfer of Care (Signed)
Anesthesia Transfer of Care Note      Patient Name:     [name]    Procedures Performed:     Procedure(s) with comments:  CONVERGENT ATRIAL FIBRILLATION ABLATION - HYBRID ATRIAL FIBRILLATION ABLATION  FLUORO-NO CHARGE (HYBRID ROOM ONLY)    Anesthesia Type:     General    Patient Location:     ICU    Last Vitals:     Vitals:    11/22/16 1531   BP: 156/76   Pulse: 62   Resp: 16   Temp:    SpO2: 96%       Post Pain:     denied    Mental Status:     Sedated but following commands     Respiratory Function:     Adequate on facemask    Cardiovascular:       Stable    Nausea/Vomiting:      improved                  Post Assessment:        Pt transported uneventfully, with Art BP, ECG, and SAT monitoring.      Joylene John, DO  11/22/2016 3:37 PM

## 2016-11-23 ENCOUNTER — Inpatient Hospital Stay: Payer: Medicare Other

## 2016-11-23 LAB — ECG 12-LEAD
P Wave Axis: 76 deg
P Wave Axis: 105 deg
P-R Interval: 199 ms
P-R Interval: 180 ms
Patient Age: 65 years
Patient Age: 65 years
Q-T Interval(Corrected): 495 ms
Q-T Interval(Corrected): 482 ms
Q-T Interval: 469 ms
Q-T Interval: 403 ms
QRS Axis: 40 deg
QRS Axis: 23 deg
QRS Duration: 93 ms
QRS Duration: 93 ms
T Axis: 109 years
T Axis: 139 years
Ventricular Rate: 67 //min
Ventricular Rate: 86 //min

## 2016-11-23 LAB — BASIC METABOLIC PANEL
Anion Gap: 11.7 mMol/L (ref 7.0–18.0)
Anion Gap: 17.6 mMol/L (ref 7.0–18.0)
BUN / Creatinine Ratio: 19.1 Ratio (ref 10.0–30.0)
BUN / Creatinine Ratio: 18.6 Ratio (ref 10.0–30.0)
BUN: 31 mg/dL — ABNORMAL HIGH (ref 7–22)
BUN: 49 mg/dL — ABNORMAL HIGH (ref 7–22)
CO2: 23.5 mMol/L (ref 20.0–30.0)
CO2: 19.5 mMol/L — ABNORMAL LOW (ref 20.0–30.0)
Calcium: 8.7 mg/dL (ref 8.5–10.5)
Calcium: 9.1 mg/dL (ref 8.5–10.5)
Chloride: 111 mMol/L — ABNORMAL HIGH (ref 98–110)
Chloride: 103 mMol/L (ref 98–110)
Creatinine: 1.62 mg/dL — ABNORMAL HIGH (ref 0.80–1.30)
Creatinine: 2.64 mg/dL — ABNORMAL HIGH (ref 0.80–1.30)
EGFR: 43 mL/min/{1.73_m2} — ABNORMAL LOW (ref 60–150)
EGFR: 24 mL/min/{1.73_m2} — ABNORMAL LOW (ref 60–150)
Glucose: 120 mg/dL — ABNORMAL HIGH (ref 71–99)
Glucose: 229 mg/dL — ABNORMAL HIGH (ref 71–99)
Osmolality Calc: 291 mOsm/kg (ref 275–300)
Osmolality Calc: 290 mOsm/kg (ref 275–300)
Potassium: 4.2 mMol/L (ref 3.5–5.3)
Potassium: 5.1 mMol/L (ref 3.5–5.3)
Sodium: 142 mMol/L (ref 136–147)
Sodium: 135 mMol/L — ABNORMAL LOW (ref 136–147)

## 2016-11-23 LAB — VH DEXTROSE STICK GLUCOSE
Glucose POCT: 114 mg/dL — ABNORMAL HIGH (ref 71–99)
Glucose POCT: 124 mg/dL — ABNORMAL HIGH (ref 71–99)
Glucose POCT: 128 mg/dL — ABNORMAL HIGH (ref 71–99)
Glucose POCT: 130 mg/dL — ABNORMAL HIGH (ref 71–99)
Glucose POCT: 142 mg/dL — ABNORMAL HIGH (ref 71–99)
Glucose POCT: 146 mg/dL — ABNORMAL HIGH (ref 71–99)
Glucose POCT: 205 mg/dL — ABNORMAL HIGH (ref 71–99)
Glucose POCT: 220 mg/dL — ABNORMAL HIGH (ref 71–99)
Glucose POCT: 240 mg/dL — ABNORMAL HIGH (ref 71–99)

## 2016-11-23 LAB — CBC
Hematocrit: 34 % — ABNORMAL LOW (ref 39.0–52.5)
Hematocrit: 31 % — ABNORMAL LOW (ref 39.0–52.5)
Hemoglobin: 11 gm/dL — ABNORMAL LOW (ref 13.0–17.5)
Hemoglobin: 10.3 gm/dL — ABNORMAL LOW (ref 13.0–17.5)
MCH: 30 pg (ref 28–35)
MCH: 31 pg (ref 28–35)
MCHC: 32 gm/dL (ref 32–36)
MCHC: 33 gm/dL (ref 32–36)
MCV: 93 fL (ref 80–100)
MCV: 94 fL (ref 80–100)
MPV: 6.9 fL (ref 6.0–10.0)
MPV: 7.1 fL (ref 6.0–10.0)
PLT CT: 199 10*3/uL (ref 130–440)
PLT CT: 203 10*3/uL (ref 130–440)
RBC: 3.64 10*6/uL — ABNORMAL LOW (ref 4.00–5.70)
RBC: 3.31 10*6/uL — ABNORMAL LOW (ref 4.00–5.70)
RDW: 16.1 % — ABNORMAL HIGH (ref 11.0–14.0)
RDW: 16.4 % — ABNORMAL HIGH (ref 11.0–14.0)
WBC: 15.2 10*3/uL — ABNORMAL HIGH (ref 4.0–11.0)
WBC: 19.4 10*3/uL — ABNORMAL HIGH (ref 4.0–11.0)

## 2016-11-23 LAB — MAGNESIUM: Magnesium: 2.3 mg/dL (ref 1.6–2.6)

## 2016-11-23 LAB — PT/INR
PT INR: 1.3 (ref 0.5–1.3)
PT: 13.2 s — ABNORMAL HIGH (ref 9.5–11.5)

## 2016-11-23 MED ORDER — TORSEMIDE 20 MG PO TABS
10.0000 mg | ORAL_TABLET | Freq: Every day | ORAL | Status: DC
Start: 2016-11-23 — End: 2016-11-25
  Administered 2016-11-23 – 2016-11-24 (×2): 10 mg via ORAL
  Filled 2016-11-23 (×3): qty 1

## 2016-11-23 MED ORDER — TRAMADOL HCL 50 MG PO TABS
50.0000 mg | ORAL_TABLET | Freq: Four times a day (QID) | ORAL | Status: DC | PRN
Start: 2016-11-23 — End: 2016-12-09
  Administered 2016-11-23 – 2016-12-09 (×33): 50 mg via ORAL
  Filled 2016-11-23 (×33): qty 1

## 2016-11-23 MED ORDER — RIVAROXABAN 15 MG PO TABS
15.0000 mg | ORAL_TABLET | Freq: Every day | ORAL | Status: DC
Start: 2016-11-23 — End: 2016-11-24
  Administered 2016-11-23 – 2016-11-24 (×2): 15 mg via ORAL
  Filled 2016-11-23 (×2): qty 1

## 2016-11-23 MED ORDER — MORPHINE SULFATE 4 MG/ML IJ/IV SOLN (WRAP)
1.0000 mg | Status: DC | PRN
Start: 2016-11-23 — End: 2016-11-27
  Administered 2016-11-23 – 2016-11-27 (×16): 1 mg via INTRAVENOUS
  Filled 2016-11-23 (×17): qty 1

## 2016-11-23 MED ORDER — INSULIN ASPART 100 UNIT/ML SC SOPN
2.0000 [IU] | PEN_INJECTOR | Freq: Three times a day (TID) | SUBCUTANEOUS | Status: DC | PRN
Start: 2016-11-23 — End: 2016-11-25
  Administered 2016-11-23 – 2016-11-24 (×3): 3 [IU] via SUBCUTANEOUS
  Administered 2016-11-24 – 2016-11-25 (×3): 2 [IU] via SUBCUTANEOUS
  Filled 2016-11-23: qty 3

## 2016-11-23 MED ORDER — PRAVASTATIN SODIUM 40 MG PO TABS
40.0000 mg | ORAL_TABLET | Freq: Every evening | ORAL | Status: DC
Start: 2016-11-23 — End: 2016-12-09
  Administered 2016-11-23 – 2016-12-08 (×16): 40 mg via ORAL
  Filled 2016-11-23 (×18): qty 1

## 2016-11-23 MED ORDER — GLUCAGON 1 MG IJ SOLR (WRAP)
1.0000 mg | INTRAMUSCULAR | Status: DC | PRN
Start: 2016-11-23 — End: 2016-11-28

## 2016-11-23 MED ORDER — APIXABAN 5 MG PO TABS
5.0000 mg | ORAL_TABLET | Freq: Two times a day (BID) | ORAL | Status: DC
Start: 2016-11-23 — End: 2016-11-23
  Filled 2016-11-23: qty 1

## 2016-11-23 MED ORDER — INSULIN ASPART 100 UNIT/ML SC SOPN
1.0000 [IU] | PEN_INJECTOR | Freq: Every evening | SUBCUTANEOUS | Status: DC | PRN
Start: 2016-11-23 — End: 2016-11-25
  Administered 2016-11-23: 22:00:00 2 [IU] via SUBCUTANEOUS
  Administered 2016-11-24: 22:00:00 3 [IU] via SUBCUTANEOUS
  Administered 2016-11-24: 03:00:00 2 [IU] via SUBCUTANEOUS
  Administered 2016-11-25: 04:00:00 3 [IU] via SUBCUTANEOUS

## 2016-11-23 MED ORDER — LISINOPRIL 10 MG PO TABS
10.0000 mg | ORAL_TABLET | Freq: Every day | ORAL | Status: DC
Start: 2016-11-23 — End: 2016-11-24
  Administered 2016-11-23 – 2016-11-24 (×2): 10 mg via ORAL
  Filled 2016-11-23 (×2): qty 1

## 2016-11-23 MED ORDER — DEXTROSE 10 % IV BOLUS
125.0000 mL | INTRAVENOUS | Status: DC | PRN
Start: 2016-11-23 — End: 2016-11-28

## 2016-11-23 NOTE — Progress Notes (Signed)
Per the [facility] Automatic Renal Dosing Protocol:    The following medication was adjusted based on the patient's creatinine clearance:    Medication: rivaroxaban  Indication: atrial fibrillation  SCr: 1.62mg /dL  CrCL: 39ml/min  Previous Dosing Regimen: 20mg  PO daily with dinner  New Dosing Regimen: 15mg  PO daily with dinner    Thank you!  [facility] Department  [phone]

## 2016-11-23 NOTE — Progress Notes (Signed)
CARDIAC SURGERY DAILY PROGRESS NOTE    Date Time: 11/23/16 7:13 AM  [name]  Attending Physician: [name], MD    Assessment/Plan:   POD#1 s/p hybrid left atrial ablation using bipolar radiofrequency   Status: Pt doing well.   Drips:  . sodium chloride 50 mL/hr at 11/22/16 1800   . insulin regular 1.6 Units/hr (11/23/16 0556)     BP 117/71   Pulse 81   Temp 98.2 F (36.8 C) (Oral)   Resp 15   Ht 1.702 m (5\' 7" )   Wt 85.6 kg (188 lb 11.4 oz)   SpO2 99%   BMI 29.56 kg/m     Weight:  Pre-Op Weight: 85.3 kg (188 lb) (11/15/16 1033)  Post-Op Weight: 85.6 kg (188 lb 11.4 oz) (11/22/16 8335)  Net I/O: negative 421 ml   Chest tube ouput 220 ml past 8 hours.     Cardiac: NSR  Respiratory: CTA bilateral, CXR pending     Labs:  WBC 15.2, H/H 11, 34 PLT 199  Magnesium 2.3  Creatinine: 1.62, baseline 1.8-2.0    Continue pain management, increase activity as tolerated, DVT prophylaxis.     Home lisinopril 10 mg and statin restarted, will hold off on ordering rest of home meds and discuss with Dr. [name].     Disposition: Luiz Blare out today. Continue pain management. Encouraged ambulation. Transfer to floor today, will monitor chest tube output       Subjective:     Pt c/o incision pain. No other complaints.     Physical Exam:     Vitals:  Temp: 98.2 F (36.8 C)  Heart Rate: 81  BP: 117/71  SpO2: 99 %  O2 Flow Rate (L/min): 4 L/min    General: awake, alert, pleasant, c/o incision pain  Neck: supple, swan in, site without hematoma   Cardiovascular: regular rate and rhythm, normal S1,S2 no murmurs, rubs or gallops. Sub xiphoid incision c/d/i  Lungs: clear to auscultation bilaterally, normal respiratory effort, med chest tube in  Abdomen:  normoactive bowel sounds, soft, non-tender, non-distended  Extremities: no clubbing, cyanosis, 1+ LE bilateral edema. Radial pulses 2+. Bilateral groin dressings with dried blood, no hematoma    Neuro: grossly intact.       Medications:     Current  Facility-Administered Medications   Medication Dose Route Frequency   . albuterol-ipratropium  3 mL Nebulization Q6H SCH   . amiodarone  200 mg Oral Q12H SCH   . amLODIPine  10 mg Oral Daily   . aspirin  81 mg Oral Daily   . colchicine  0.6 mg Oral BID   . divalproex EC/DR tablet  500 mg Oral Q12H [place]   . famotidine  20 mg Intravenous Q12H SCH   . gabapentin  100 mg Oral Q12H [place]   . pantoprazole  40 mg Oral BID AC     Current Facility-Administered Medications   Medication Dose Route Frequency Last Rate   . sodium chloride   Intravenous Continuous 50 mL/hr at 11/22/16 1800   . insulin regular  0.5-99 Units/hr Intravenous Continuous 1.6 Units/hr (11/23/16 0556)     Current Facility-Administered Medications   Medication Dose Route   . albuterol  2.5 mg Nebulization   . dextrose  250 mL Intravenous   . fentaNYL (PF)  50 mcg Intravenous   . insulin regular  1-50 Units Intravenous   . morphine  4 mg Intravenous   . ondansetron  4 mg Oral  Or   . ondansetron  4 mg Intravenous   . promethazine  25 mg Oral    Or   . promethazine  12.5 mg Rectal    Or   . promethazine  6.25 mg Intramuscular           Labs:       Recent Labs  Lab 11/23/16  0340 11/22/16  1540   Sodium 142 140   Potassium 4.2 4.4   Chloride 111* 109   CO2 23.5 23.3   BUN 31* 26*   Creatinine 1.62* 1.58*   EGFR 43* 45*   Calcium 8.7 8.3*   Magnesium 2.3  --        Recent Labs  Lab 11/23/16  0340 11/22/16  2100 11/22/16  1540   WBC 15.2* 14.1* 15.0*   RBC 3.64* 3.67* 4.10   Hemoglobin 11.0* 11.2* 12.6*   Hematocrit 34.0* 34.1* 38.2*   MCV 93 93 93   PLT CT 199 190 221       Recent Labs  Lab 11/23/16  0340 11/22/16  1540   PT 13.2* 13.1*   PT INR 1.3 1.3       Imaging:     Radiology Results (24 Hour)     Procedure Component Value Units Date/Time    X-ray chest AP portable [[id number]] Resulted:  11/23/16 0347    Order Status:  Sent Updated:  11/23/16 0514    XR Chest AP Only [[id number]] Collected:  11/22/16 1559    Order Status:  Completed Updated:  11/22/16  1602    Narrative:       Clinical History:  Reason For Exam: post ablation  History of hypertension, paroxysmal atrial fibrillation, and diabetes.    Examination:  Frontal view of the chest.    Comparison:  Nov 15, 2016.    Findings:  Right IJ catheter in good position. No pneumothorax. Small caliber catheter at the level of the diaphragm in the midline.    No overt failure or acute pulmonary disease.    Heart enlarged and stable.    Question small left pleural effusion.    Small amount subcutaneous emphysema in the anterior chest wall.      Impression:       No pneumothorax or significant acute pulmonary abnormalities.    Small amount of subcutaneous emphysema in the anterior chest.    ReadingStation:[id number]    FLUORO-NO CHARGE (HYBRID ROOM ONLY) [[id number]] Resulted:  11/22/16 1105    Order Status:  Completed Updated:  11/22/16 1105    Narrative:       See procedural report for fluoroscopy result.            Signed by: Leanor Kail, PA  Date: November 23, 2016  Time: 7:13 AM

## 2016-11-23 NOTE — Plan of Care (Signed)
Problem: Moderate/High Fall Risk Score >5  Goal: Patient will remain free of falls  Outcome: Progressing     Problem: Hemodynamic Status: Cardiac  Goal: Stable vital signs and fluid balance  Outcome: Progressing     Problem: Impaired Mobility  Goal: Mobility/Activity is maintained at optimal level for patient  Outcome: Progressing

## 2016-11-23 NOTE — Progress Notes (Signed)
Chest tube removed without complications, CXR in AM

## 2016-11-23 NOTE — Plan of Care (Signed)
Problem: Hemodynamic Status: Cardiac  Goal: Stable vital signs and fluid balance  Outcome: Progressing   11/23/16 0053   Goal/Interventions addressed this shift   Stable vital signs and fluid balance Monitor/assess vital signs and telemetry per unit protocol;Weigh on admission and record weight daily;Assess signs and symptoms associated with cardiac rhythm changes;Monitor lab values;Monitor intake/output per unit protocol and/or LIP order;Monitor for leg swelling/edema and report to LIP if abnormal       Problem: Ineffective Gas Exchange  Goal: Effective breathing pattern  Outcome: Progressing   11/23/16 0053   Goal/Interventions addressed this shift   Effective breathing pattern Maintain O2 saturation level per LIP order;Maintain CO2 level per LIP order;Teach/reinforce use of ordered respiratory interventions (ie. CPAP, BiPAP, Incentive Spirometer, Acapella);Monitor for sleep apnea;Monitor for medication induced respiratory depression

## 2016-11-23 NOTE — Progress Notes (Addendum)
Cardiology Progress Note      Date Time: 11/23/16 7:04 PM  [name], [name]      Assessment:     Active Hospital Problems    Diagnosis   . Atrial fibrillation             Plan:     1. Renal function stable.   2. Remains in sinus rhythm.   3. Continues to have chest pain, however improving every day.   4. Will restart apixaban tonight.   5. Mild volume overload on CXR. Will start low dose diuretic.   6. Likely home on Thursday.       Subjective:   Feels better, has been ambulating the hallways.     Physical Exam:     Temp:  [97 F (36.1 C)-98.4 F (36.9 C)] 98.2 F (36.8 C)  Heart Rate:  [72-91] 82  Resp Rate:  [13-22] 18  BP: (117-155)/(71-96) 125/85  Arterial Line BP: (132-165)/(63-80) 148/77    Wt Readings from Last 3 Encounters:   11/22/16 85.6 kg (188 lb 11.4 oz)   10/21/16 85.5 kg (188 lb 9.6 oz)   10/07/16 84 kg (185 lb 3.2 oz)            Intake/Output Summary (Last 24 hours) at 11/23/16 1904  Last data filed at 11/23/16 1300   Gross per 24 hour   Intake           2296.9 ml   Output              799 ml   Net           1497.9 ml       General appearance - alert, well appearing, and in no distress  Chest -clear to auscultation, no wheezes, rales or rhonchi, symmetric air entry  Heart - Slight friction rub noted at left upper sternal border.   Extremities - 1+ lower extremity edema  Abd: soft. Non-tender, non-distended,  Neuro: alert and oriented  Skin: no rashes  Psych: normal affect    Medications:     Scheduled Meds:   albuterol-ipratropium 3 mL Nebulization Q6H [place]   amiodarone 200 mg Oral Q12H SCH   amLODIPine 10 mg Oral Daily   aspirin 81 mg Oral Daily   colchicine 0.6 mg Oral BID   divalproex EC/DR tablet 500 mg Oral Q12H SCH   gabapentin 100 mg Oral Q12H SCH   lisinopril 10 mg Oral Daily   pantoprazole 40 mg Oral BID AC   pravastatin 40 mg Oral QPM             Labs:       Recent Labs  Lab 11/23/16  0340 11/22/16  1540   Glucose 120* 245*   BUN 31* 26*   Creatinine 1.62* 1.58*    Sodium 142 140   Potassium 4.2 4.4   Chloride 111* 109   CO2 23.5 23.3   Magnesium 2.3  --      Estimated Creatinine Clearance: 46.3 mL/min (A) (based on SCr of 1.62 mg/dL (H)).      Recent Labs  Lab 10/21/16  0810   CHOL 91   TRIG 59   HDL 33*   LDL 46         Recent Labs  Lab 11/23/16  0340 11/22/16  2100 11/22/16  1540   WBC 15.2* 14.1* 15.0*   Hemoglobin 11.0* 11.2* 12.6*   Hematocrit 34.0* 34.1* 38.2*   PLT CT 199 190  221   PT INR 1.3  --  1.3       No results for input(s): TROPI, CK, CKMB in the last 8760 hours.    No results for input(s): TSH in the last 8760 hours.    Radiology: all results from this admission  Xr Chest 2 Views    Result Date: 11/15/2016  Cardiomegaly with cephalization of pulmonary vascular flow. No focal infiltrate, pleural effusion, or pneumothorax. ReadingStation:[email or web address]    Xr Chest Ap Only    Result Date: 11/22/2016  No pneumothorax or significant acute pulmonary abnormalities. Small amount of subcutaneous emphysema in the anterior chest. ReadingStation:[email or web address]    X-ray Chest Ap Portable    Result Date: 11/23/2016  1. Pulmonary vascular congestion without overt failure 2. Left basilar opacity likely combination of atelectasis, consolidation and small effusion. 3. Catheter appliances as above [place], DO

## 2016-11-23 NOTE — Progress Notes (Addendum)
[place] [id number]     INITIAL ASSESSMENT    Case Management       RX Coverage Yes  Pharmacy: [facility]  Able to afford medications   Inpatient Plan of Care:      POD #  1 hybrid left atrial ablation   NC O2   Up in chair   PT/OT eval   CM Interventions:      Chart reviewed, pt interviewed.  Discussed HH w/patient but declines.  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 Lives alone    Independent in ADL's, amb w/o assist devices    BARRIERS:   None identified    NEEDS   HH suggested but declines    PLAN:   Home with family/friends support     RNCM to follow/assist w/disch needs            11/23/16 0849   Patient Type   Within 30 Days of Previous Admission? No   Healthcare Decisions   Interviewed: Patient   Orientation/Decision Making Abilities of Patient Alert and Oriented x3, able to make decisions   Advance Directive Patient does not have advance directive   Healthcare Agent Appointed No   Prior to admission   Prior level of function Independent with ADLs;Ambulates independently   Type of Residence Private residence   Home Layout Multi-level;Work area in basement;Performs ADL's on one level;Able to live on main level with bedroom/bathroom;Stairs to enter with rails (add number in comment)  (7 steps to enter the home w/rails)   Have running water, electricity, heat, etc? Yes  [employer] heat, town water)   Living Arrangements Alone   How do you get to your MD appointments? Self   How do you get your groceries? Self   Who fixes your meals? Self   Who does your laundry? Self   Who picks up your prescriptions? Self-  [facility] on [address] in [place]   DME Currently at [place] point cane;Quad cane;Wheelchair-manual;Front wheel walker;Standard walker   Home Care/Community Services None   Adult Protective Services  (APS) involved? No   Discharge Planning   Support Systems Church/faith community;Children   Patient expects to be discharged to: Home w/family support    Anticipated [place] plan discussed with: Same as interviewed   Mode of transportation: Private car (family member)   Consults/Providers   Correct PCP listed in Epic? Yes  (Dr. [name], last time seen was several weeks ago)     Kieth Brightly A. Helene Kelp, RN MSN  Case Management  Ph: [phone]  Fax: [phone]

## 2016-11-24 ENCOUNTER — Inpatient Hospital Stay: Payer: Medicare Other

## 2016-11-24 ENCOUNTER — Telehealth: Payer: Self-pay | Admitting: Internal Medicine

## 2016-11-24 DIAGNOSIS — R109 Unspecified abdominal pain: Secondary | ICD-10-CM

## 2016-11-24 DIAGNOSIS — R140 Abdominal distension (gaseous): Secondary | ICD-10-CM

## 2016-11-24 LAB — BASIC METABOLIC PANEL
Anion Gap: 16 mMol/L (ref 7.0–18.0)
BUN / Creatinine Ratio: 20.3 Ratio (ref 10.0–30.0)
BUN: 53 mg/dL — ABNORMAL HIGH (ref 7–22)
CO2: 19.9 mMol/L — ABNORMAL LOW (ref 20.0–30.0)
Calcium: 9 mg/dL (ref 8.5–10.5)
Chloride: 103 mMol/L (ref 98–110)
Creatinine: 2.61 mg/dL — ABNORMAL HIGH (ref 0.80–1.30)
EGFR: 24 mL/min/{1.73_m2} — ABNORMAL LOW (ref 60–150)
Glucose: 187 mg/dL — ABNORMAL HIGH (ref 71–99)
Osmolality Calc: 288 mOsm/kg (ref 275–300)
Potassium: 4.9 mMol/L (ref 3.5–5.3)
Sodium: 134 mMol/L — ABNORMAL LOW (ref 136–147)

## 2016-11-24 LAB — ECG 12-LEAD
P Wave Axis: 83 deg
P-R Interval: 178 ms
Patient Age: 65 years
Q-T Interval(Corrected): 460 ms
Q-T Interval: 380 ms
QRS Axis: 29 deg
QRS Duration: 96 ms
T Axis: 66 years
Ventricular Rate: 88 //min

## 2016-11-24 LAB — CBC
Hematocrit: 31.3 % — ABNORMAL LOW (ref 39.0–52.5)
Hemoglobin: 10.1 gm/dL — ABNORMAL LOW (ref 13.0–17.5)
MCH: 30 pg (ref 28–35)
MCHC: 32 gm/dL (ref 32–36)
MCV: 94 fL (ref 80–100)
MPV: 7.4 fL (ref 6.0–10.0)
PLT CT: 191 10*3/uL (ref 130–440)
RBC: 3.34 10*6/uL — ABNORMAL LOW (ref 4.00–5.70)
RDW: 16.2 % — ABNORMAL HIGH (ref 11.0–14.0)
WBC: 17 10*3/uL — ABNORMAL HIGH (ref 4.0–11.0)

## 2016-11-24 LAB — VH DEXTROSE STICK GLUCOSE
Glucose POCT: 210 mg/dL — ABNORMAL HIGH (ref 71–99)
Glucose POCT: 196 mg/dL — ABNORMAL HIGH (ref 71–99)
Glucose POCT: 222 mg/dL — ABNORMAL HIGH (ref 71–99)
Glucose POCT: 252 mg/dL — ABNORMAL HIGH (ref 71–99)

## 2016-11-24 MED ORDER — BISACODYL 10 MG RE SUPP
10.0000 mg | Freq: Once | RECTAL | Status: AC
Start: 2016-11-24 — End: 2016-11-24
  Administered 2016-11-24: 22:00:00 10 mg via RECTAL
  Filled 2016-11-24: qty 1

## 2016-11-24 MED ORDER — DOCUSATE SODIUM 100 MG PO CAPS
100.0000 mg | ORAL_CAPSULE | Freq: Every day | ORAL | Status: DC
Start: 2016-11-24 — End: 2016-12-09
  Administered 2016-11-24 – 2016-12-08 (×14): 100 mg via ORAL
  Filled 2016-11-24 (×16): qty 1

## 2016-11-24 MED ORDER — LISINOPRIL 5 MG PO TABS
2.5000 mg | ORAL_TABLET | Freq: Every day | ORAL | Status: DC
Start: 2016-11-25 — End: 2016-11-24

## 2016-11-24 MED ORDER — MORPHINE SULFATE 4 MG/ML IJ/IV SOLN (WRAP)
3.0000 mg | Freq: Once | Status: AC
Start: 2016-11-24 — End: 2016-11-24
  Administered 2016-11-24: 23:00:00 3 mg via INTRAVENOUS
  Filled 2016-11-24: qty 1

## 2016-11-24 MED ORDER — SENNA 8.6 MG PO TABS
8.6000 mg | ORAL_TABLET | Freq: Every evening | ORAL | Status: DC
Start: 2016-11-24 — End: 2016-12-09
  Administered 2016-11-24 – 2016-12-08 (×16): 8.6 mg via ORAL
  Filled 2016-11-24 (×18): qty 1

## 2016-11-24 MED ORDER — ACETAMINOPHEN 10 MG/ML IV SOLN
1000.0000 mg | Freq: Three times a day (TID) | INTRAVENOUS | Status: AC
Start: 2016-11-24 — End: 2016-11-26
  Administered 2016-11-24 – 2016-11-26 (×6): 1000 mg via INTRAVENOUS
  Filled 2016-11-24 (×7): qty 100

## 2016-11-24 MED ORDER — ACETAMINOPHEN 10 MG/ML IV SOLN
1000.0000 mg | Freq: Four times a day (QID) | INTRAVENOUS | Status: DC
Start: 2016-11-24 — End: 2016-11-24
  Filled 2016-11-24 (×3): qty 100

## 2016-11-24 MED ORDER — POLYETHYLENE GLYCOL 3350 17 G PO PACK
17.0000 g | PACK | Freq: Every day | ORAL | Status: DC
Start: 2016-11-24 — End: 2016-12-09
  Administered 2016-11-24 – 2016-12-06 (×8): 17 g via ORAL
  Filled 2016-11-24 (×17): qty 1

## 2016-11-24 MED ORDER — FENTANYL CITRATE (PF) 50 MCG/ML IJ SOLN (WRAP)
25.0000 ug | Freq: Once | INTRAMUSCULAR | Status: AC
Start: 2016-11-24 — End: 2016-11-24
  Administered 2016-11-24: 18:00:00 25 ug via INTRAVENOUS
  Filled 2016-11-24: qty 2

## 2016-11-24 NOTE — Consults (Signed)
[place]   Name:  [name], [name]                 MR#:  [id number]               Date:  11/24/16       Requesting Physician:  [name] (received call from physician)  Reason for Consultation:  Abdominal pain    IMPRESSION:   66 y.o. male  with <principal problem not specified>.      Active Hospital Problems    Diagnosis   . Atrial fibrillation     66 yo male, h/o a-fib, POD#2 after hybrid L atrial ablation (Convergent procedure) who complained of abdominal pain and bloating today.  AXR demonstrates dilated loops of bowel.  Upon review of xray, the stomach is dilated and filled with fluid, there are centrally located dilated small bowel loops, and the right colon is filled with stool.  He is passing flatus.  The abdomen is distended but not peritoneal.  He appears to be in severe pain, stating that it hurts in the right chest but not in the abdomen.  This is consistent with ileus.      RECOMMENDATIONS:    Recommend repeat AXR in the am   If pt starts vomiting, will recommend NG tube placement.    Maximize non-narcotic pain control:  Ordered ofirimev.  Pt has dx of fatty liver so scheduling max of 3 grams per 24 hrs rather than 4 grams   Increased bowel regimen:  Added colace and a one time dulcolax suppository.  Continue miralax and senna   If symptoms do not resolve or worsen, recommend oral contrast CT scan.  I did not order it at this time because he is non-tender and passing flatus.     ACCESS team will continue to follow.  If pt has increased abdominal pain or worsening condition, please do not hesitate to call or page.     Dagoberto Ligas, MD      ACCESS [phone] or [phone]      CC:  Patient complains of right chest pain and nausea      HPI:   66 y.o. male  presents with a complaint of pain located mostly in the chest with mild abdominal discomfort, bloating, and nausea.  Chest pain is severe and in the right chest, worse with breathing, coughing, movement.  It feels like a stabbing pain that  he ranks at a 3/10 but increases sharply.  He is having shortness of breath and cannot tolerate lying down.  He stated this pain started with the surgical procedure and has not improved.      His abdominal discomfort started today and is mild, intermittent.  Associated with nausea and bloating.  Symptoms have been unchanged.  No aggravating or alleviating factors.  Reports passing flatus but no bowel movement.     PMH:   He  has a past medical history of Arrhythmia; Arthritis; Atrial fibrillation; Atrial flutter; Bipolar affective; BPH (benign prostatic hyperplasia); Complication of anesthesia; Diabetes mellitus; Diabetic neuropathy; Gout; Heart murmur; HOH (hard of hearing); Hyperlipidemia; Hypertension; Paroxysmal atrial fibrillation; Pulmonary hypertension; Renal insufficiency; Type 2 diabetes mellitus, controlled; and Wears glasses.     PSH:   He  has a past surgical history that includes Appendectomy; Hernia repair; TONSILLECTOMY, ADENOIDECTOMY; CARDIAC ABLATION; Cardioversion; TEE; Cardiac catheterization; and Tonsillectomy.     Social History:   He  reports that he quit smoking about 23 years ago. His smoking  use included Cigarettes. He has a 12.00 pack-year smoking history. He has never used smokeless tobacco. He reports that he does not drink alcohol or use drugs.    Family History:   His family history includes Diabetes in his brother, mother, and sister; Heart disease in his brother and mother; Hyperlipidemia in his brother, brother, and sister; Hypertension in his brother, brother, daughter, mother, and sister.       Home Medications:     Outpatient Prescriptions Marked as Taking for the 11/22/16 encounter [facility] Encounter)   Medication Sig Dispense Refill   . allopurinol (ZYLOPRIM) 300 MG tablet Take 300 mg by mouth daily.     Marland Kitchen amiodarone (PACERONE) 200 MG tablet Take 200 mg by mouth daily.     Marland Kitchen atenolol (TENORMIN) 100 MG tablet Take 100 mg by mouth 2 (two) times daily.         . dilTIAZem (CARTIA  XT) 240 MG 24 hr capsule Take 240 mg by mouth daily.     . divalproex EC/DR (DEPAKOTE EC/DR) 500 MG EC tablet Take 500 mg by mouth 2 (two) times daily.     . furosemide (LASIX) 40 MG tablet Take 40 mg by mouth daily.         . furosemide (LASIX) 80 MG tablet Take 80 mg by mouth daily.         Marland Kitchen gabapentin (NEURONTIN) 100 MG capsule Take 100 mg by mouth 2 (two) times daily.     Marland Kitchen glimepiride (AMARYL) 1 MG tablet Take 0.5 mg by mouth every morning before breakfast.         . lisinopril (PRINIVIL,ZESTRIL) 10 MG tablet Take 10 mg by mouth daily.         Marland Kitchen loperamide (IMODIUM) 2 MG capsule Take 2 mg by mouth 2 (two) times daily as needed.         . metFORMIN (GLUCOPHAGE) 1000 MG tablet Twice daily with food      . minoxidil (LONITEN) 10 MG tablet Take 30 mg by mouth daily.     . Multiple Vitamin (MULTIVITAMIN) capsule Take 1 capsule by mouth daily.     . pravastatin (PRAVACHOL) 40 MG tablet Take 40 mg by mouth every evening.     . rivaroxaban (XARELTO) 20 MG Tab Take 20 mg by mouth daily with dinner.     . tamsulosin (FLOMAX) 0.4 MG Cap Take 0.4 mg by mouth daily.     . [DISCONTINUED] metFORMIN (GLUCOPHAGE) 500 MG tablet Take 500 mg by mouth 2 (two) times daily with meals.         Hospital Medications:     acetaminophen 1,000 mg Intravenous 4 times per day   amiodarone 200 mg Oral Q12H [place]   amLODIPine 10 mg Oral Daily   divalproex EC/DR tablet 500 mg Oral Q12H SCH   gabapentin 100 mg Oral Q12H SCH   morphine 3 mg Intravenous Once   pantoprazole 40 mg Oral BID AC   polyethylene glycol 17 g Oral Daily   pravastatin 40 mg Oral QPM   senna 8.6 mg Oral QHS   torsemide 10 mg Oral Daily        Allergies:   He is allergic to codeine.     ROS(10 of 14):   Pertinent details as detailed in HPI.    In addition, patient denies dysuria, back pain  Patient also admits to shortness of breath, orthopnea, LE swelling, hard of hearing,      Physical Exam(8 of 12):  Blood pressure (!) 154/112, pulse 94, temperature 97.2 F (36.2 C),  temperature source Oral, resp. rate 16, height 1.702 m (5\' 7" ), weight 86.7 kg (191 lb 2.2 oz), SpO2 97 %.   General: well-nourished, appears uncomfortable due to pain, appears acutely mildly ill  ZOXWR(6-EAV/+): normocephalic, atraumatic, pupils reactive, EOMI  Neck(0): supple, no lymphadenopathy  Chest: equal breath sounds, tenderness at mid chest  Cardiac: regular rate and rhythm, 2/6 murmur at right upper sternal border  Abdomen: soft, nontender, moderately  distended  Extremities: 3+ edema BLE, no cyanosis or clubbing and no tenderness or deformities noted  Neuro: alert, no gross focal neurologic deficits, no involuntary movements,    Psych: insight good, no memory problems were noted, anxious, distressed due to pain  Skin: no jaundice, no rashes    Labs:    Lab results have been reviewed as follows:  Results     Procedure Component Value Units Date/Time    Dextrose Stick Glucose [[id number]]  (Abnormal) Collected:  11/24/16 1244    Specimen:  Blood Updated:  11/24/16 1259     Glucose, POCT 222 (H) mg/dL     Dextrose Stick Glucose [[id number]]  (Abnormal) Collected:  11/24/16 0843    Specimen:  Blood Updated:  11/24/16 0909     Glucose, POCT 196 (H) mg/dL     Basic metabolic panel (QAM x 1) [[id number]]  (Abnormal) Collected:  11/24/16 0353    Specimen:  Plasma Updated:  11/24/16 0451     Sodium 134 (L) mMol/L      Potassium 4.9 mMol/L      Chloride 103 mMol/L      CO2 19.9 (L) mMol/L      Calcium 9.0 mg/dL      Glucose 187 (H) mg/dL      Creatinine 2.61 (H) mg/dL      BUN 53 (H) mg/dL      Anion Gap 16.0 mMol/L      BUN/Creatinine Ratio 20.3 Ratio      EGFR 24 (L) mL/min/1.53m2      Osmolality Calc 288 mOsm/kg     CBC (QAM x 1) [[id number]]  (Abnormal) Collected:  11/24/16 0353    Specimen:  Blood from Blood Updated:  11/24/16 0426     WBC 17.0 (H) K/cmm      RBC 3.34 (L) M/cmm      Hemoglobin 10.1 (L) gm/dL      Hematocrit 31.3 (L) %      MCV 94 fL      MCH 30 pg      MCHC 32 gm/dL      RDW 16.2 (H) %      PLT CT  191 K/cmm      MPV 7.4 fL     Dextrose Stick Glucose [[id number]]  (Abnormal) Collected:  11/24/16 0248    Specimen:  Blood Updated:  11/24/16 0304     Glucose, POCT 210 (H) mg/dL     Dextrose Stick Glucose [[id number]]  (Abnormal) Collected:  11/23/16 2138    Specimen:  Blood Updated:  11/23/16 2157     Glucose, POCT 205 (H) mg/dL     Basic Metabolic Panel [[id number]]  (Abnormal) Collected:  11/23/16 1954    Specimen:  Plasma Updated:  11/23/16 2025     Sodium 135 (L) mMol/L      Potassium 5.1 mMol/L      Chloride 103 mMol/L      CO2 19.5 (L) mMol/L      Calcium 9.1 mg/dL  Glucose 229 (H) mg/dL      Creatinine 2.64 (H) mg/dL      BUN 49 (H) mg/dL      Anion Gap 17.6 mMol/L      BUN/Creatinine Ratio 18.6 Ratio      EGFR 24 (L) mL/min/1.60m2      Osmolality Calc 290 mOsm/kg     CBC [[id number]]  (Abnormal) Collected:  11/23/16 1954    Specimen:  Blood from Blood Updated:  11/23/16 2015     WBC 19.4 (H) K/cmm      RBC 3.31 (L) M/cmm      Hemoglobin 10.3 (L) gm/dL      Hematocrit 31.0 (L) %      MCV 94 fL      MCH 31 pg      MCHC 33 gm/dL      RDW 16.4 (H) %      PLT CT 203 K/cmm      MPV 7.1 fL            Radiology:   Xr Abdomen Ap    Result Date: 11/24/2016  Findings worrisome for partial proximal or mid small bowel obstruction. ReadingStation:[email or web address]    Xr Chest Ap Portable    Result Date: 11/24/2016  Cardiomegaly with bilateral airspace disease or edema with small left effusion. No observed pneumothorax. Interval removal right IJ central line. ReadingStation:[id number]        Additional Information for this encounter:  I have reviewed abd xray, heavy stool in right colon, centrally dilated small bowel loops, and dilated fluid-filled stomach noted     Signed:  Drucilla Schmidt. Vanetta Shawl, MD  ACCESS/Trauma [facility]

## 2016-11-24 NOTE — Progress Notes (Signed)
POD # 2 Hybrid Lt. Atrial ablation , HR 86, BP 151/88 sats 96%  On 4L 02  H and h 10.1 and 31.3 creatinine uo to 2.61 from 1.62  CXR , basilar atelectasis, low lung volume on the Lt.. Plan , increase IS, ambulate, reduce cho chine and lisinopril due to elevation in creatinine.

## 2016-11-24 NOTE — Progress Notes (Signed)
[place] [id number]     INITIAL ASSESSMENT  Case Management       Estimated D/C Date:     04/18   RX Coverage:       Pt is insured and has Rx coverage   Inpatient Plan of Care:      POD 2 hybrid left atrial ablation.  Weaning o2   CM Interventions:      Chart reviewed.  Discharge plan discussed with pt.  Plan to discharge home with support from family.  Declines HH.  CM will continue to follow and assist as needed.           11/24/16 1357   Patient Type   Within 30 Days of Previous Admission? No   Healthcare Decisions   Interviewed: Patient   Orientation/Decision Making Abilities of Patient Alert and Oriented x3, able to make decisions   Advance Directive Patient has advance directive, copy in chart   Prior to admission   Prior level of function Independent with ADLs;Ambulates independently   Type of Residence Private residence   Home Layout Multi-level;Performs ADL's on one level   [place]   DME Currently at [place] point cane;Quad cane;Wheelchair-manual;Front wheel walker   Acupuncturist None   Discharge Planning   Support Systems Church/faith community;Children   Patient expects to be discharged to: home with family support   Anticipated [place] plan discussed with: Same as interviewed   Mode of transportation: Private car (family member)   Consults/Providers   Correct PCP listed in Epic? Yes  Joline Salt, MD)   Veda Canning RN, BSN  Nurse Case Manager  Phone:  [phone]  Fax:  [phone]

## 2016-11-24 NOTE — Plan of Care (Signed)
Problem: Hemodynamic Status: Cardiac  Goal: Stable vital signs and fluid balance   11/24/16 0040   Goal/Interventions addressed this shift   Stable vital signs and fluid balance Monitor/assess vital signs and telemetry per unit protocol;Assess signs and symptoms associated with cardiac rhythm changes;Monitor intake/output per unit protocol and/or LIP order;Monitor for leg swelling/edema and report to LIP if abnormal;Monitor lab values;Weigh on admission and record weight daily

## 2016-11-24 NOTE — UM Notes (Signed)
Dx - long-standing persistent atrial fibrillation    Admit for elective procedure  Operation:   1- Hybrid left atrial ablation using bipolar radiofrequency (convergent procedure)  2- Transesophageal echocardiogram    Post procedure care per protocol in icu, pt with blake drain , labs and vitals per icu

## 2016-11-24 NOTE — Progress Notes (Signed)
Briefly  Reviewed findings of CT. Given neurologic changes suggestive of acute stroke, and CT findings, I have asked the neurologic consult team to evaluate the patient in am (discussed case with Dr. Althia Forts). Given duration of symptoms (the patient tells me that symptoms began yesterday evening), no acute interventions (thrombolytics/clot retrieval) can be offered. He received a dose of rivaroxaban this evening. Will f/u with MRI/MRA tomorrow am with neurologic consultation.

## 2016-11-24 NOTE — Progress Notes (Signed)
T     11/24/16 0700   Patient Assessment   Status Completed   Subjective no respiraotry distress   Pulmonary History None   Surgical Status None   Mobility Ambulatory with or without assistance   CXR ( bilateral airspace disease or edema with small left effusio)   Cough Non-productive  (hurts)   Bilateral Breath Sounds Diminished;Clear   Home regimen   Home Treatments none

## 2016-11-24 NOTE — Progress Notes (Signed)
Pt up to bedside for breakfast and lunch, but stating he was unable to walk. Finally agreed to walk with this nurse. Pt only able to ambulate 35 feet while using walker and requiring 4L/ min of O2. Pt stopped to rest two times during this walk D/T SOB and feeling weak. Pt encouraged to walk at least 3 times a day. IS education reinforces. Will continue to monitor.

## 2016-11-24 NOTE — Progress Notes (Addendum)
CT Surgery    Called to bedside to eval distended Painful Abdomen. KUB suggestive of poss SBO. Patient currently hemodynamically stable at time of this visit. He is currently on Xarelto last dose 1730 today. Complicating this picture is his CRF with current Cr 2.64 mg/dL. I have asked Gen'L Surgery and nephrology to evaluate, Drs. Tia Masker and Baldwin notified

## 2016-11-24 NOTE — Progress Notes (Signed)
Finis Bud, PA notified ob distended ABD. Pt c/o bloating. PA to bedside to assess patient. STAT ABD x-ray ordered. X-ray notified of order, they stated someone would be up ASAP. Will continue to monitor pt.

## 2016-11-24 NOTE — Progress Notes (Signed)
Cardiology Progress Note      Date Time: 11/24/16 8:13 PM  [name], [name]      Assessment:     Active Hospital Problems    Diagnosis   . Atrial fibrillation             Plan:     1. He is complaining of right upper quadrant pain, back pain. He was complaining of abdominal pain earlier today, KUB showed possible SBO. Renal function worse than yesterday. He also seems less coherent today than yesterday. Dr. Tia Masker to see the patient later tonight.   2. Right sided weakness, both right arm and right leg. Will obtain non-contrast CT scan of the head tonight.   3. Repeat echocardiogram tomorrow am, no current evidence for significant fluid re-accumulation around the pericardium based on exam.   4. Acute on chronic renal insufficiency. Will await Dr. Darol Destine input.       Subjective:   Feels weak on the right side, significantly more short of breath than yesterday. CXR shows cardiomegaly (does not appear to be changed from yesterday) Decreased breath sounds.     Physical Exam:     Temp:  [97.2 F (36.2 C)-99.7 F (37.6 C)] 97.2 F (36.2 C)  Heart Rate:  [86-95] 94  Resp Rate:  [16-24] 16  BP: (122-176)/(74-112) 154/112    Wt Readings from Last 3 Encounters:   11/24/16 86.7 kg (191 lb 2.2 oz)   10/21/16 85.5 kg (188 lb 9.6 oz)   10/07/16 84 kg (185 lb 3.2 oz)            Intake/Output Summary (Last 24 hours) at 11/24/16 2013  Last data filed at 11/24/16 1837   Gross per 24 hour   Intake              480 ml   Output             1925 ml   Net            -1445 ml       General appearance - alert, well appearing, and in no distress  Chest -inspiratory wheezing, bibasilar rales  Heart - normal rate and regular rhythm, No friction rub, no muffled heart sounds.   Extremities - 2+ lower extremity edema  Abd: soft. Non-tender, non-distended,  Neuro: alert and oriented  Skin: no rashes  Psych: normal affect    Medications:     Scheduled Meds:   acetaminophen 1,000 mg Intravenous Q8H   amiodarone 200 mg Oral  Q12H [place]   amLODIPine 10 mg Oral Daily   bisacodyl 10 mg Rectal Once   divalproex EC/DR tablet 500 mg Oral Q12H SCH   docusate sodium 100 mg Oral Daily   gabapentin 100 mg Oral Q12H [place]   morphine 3 mg Intravenous Once   pantoprazole 40 mg Oral BID AC   polyethylene glycol 17 g Oral Daily   pravastatin 40 mg Oral QPM   senna 8.6 mg Oral QHS   torsemide 10 mg Oral Daily             Labs:       Recent Labs  Lab 11/24/16  0353 11/23/16  1954 11/23/16  0340   Glucose 187* 229* 120*   BUN 53* 49* 31*   Creatinine 2.61* 2.64* 1.62*   Sodium 134* 135* 142   Potassium 4.9 5.1 4.2   Chloride 103 103 111*   CO2 19.9* 19.5* 23.5   Magnesium  --   --  2.3     Estimated Creatinine Clearance: 28.9 mL/min (A) (based on SCr of 2.61 mg/dL (H)).      Recent Labs  Lab 10/21/16  0810   CHOL 91   TRIG 59   HDL 33*   LDL 46         Recent Labs  Lab 11/24/16  0353 11/23/16  1954 11/23/16  0340  11/22/16  1540   WBC 17.0* 19.4* 15.2* More results in Results Review 15.0*   Hemoglobin 10.1* 10.3* 11.0* More results in Results Review 12.6*   Hematocrit 31.3* 31.0* 34.0* More results in Results Review 38.2*   PLT CT 191 203 199 More results in Results Review 221   PT INR  --   --  1.3  --  1.3   More results in Results Review = values in this interval not displayed.    No results for input(s): TROPI, CK, CKMB in the last 8760 hours.    No results for input(s): TSH in the last 8760 hours.    Radiology: all results from this admission  Xr Chest 2 Views    Result Date: 11/15/2016  Cardiomegaly with cephalization of pulmonary vascular flow. No focal infiltrate, pleural effusion, or pneumothorax. ReadingStation:[email or web address]    Xr Abdomen Ap    Result Date: 11/24/2016  Findings worrisome for partial proximal or mid small bowel obstruction. ReadingStation:[email or web address]    Xr Chest Ap Only    Result Date: 11/22/2016  No pneumothorax or significant acute pulmonary abnormalities. Small amount of subcutaneous emphysema in the anterior chest.  ReadingStation:[email or web address]    Xr Chest Ap Portable    Result Date: 11/24/2016  Cardiomegaly with bilateral airspace disease or edema with small left effusion. No observed pneumothorax. Interval removal right IJ central line. ReadingStation:[email or web address]    X-ray Chest Ap Portable    Result Date: 11/23/2016  1. Pulmonary vascular congestion without overt failure 2. Left basilar opacity likely combination of atelectasis, consolidation and small effusion. 3. Catheter appliances as above [place], DO

## 2016-11-24 NOTE — Progress Notes (Signed)
Foley removed at 1400. Pt DTV by 2000. Will continue to monitor.

## 2016-11-24 NOTE — Progress Notes (Signed)
Finis Bud, PA notified of ABD x-ray results. Notified of patients increase level of pain. See new orders. Will continue to monitor.

## 2016-11-24 NOTE — Plan of Care (Signed)
Problem: Moderate/High Fall Risk Score >5  Goal: Patient will remain free of falls  Outcome: Progressing     Problem: Nutrition  Goal: Nutritional intake is adequate  Outcome: Progressing

## 2016-11-24 NOTE — Telephone Encounter (Signed)
Called patient to inform him that his appointment with Dr. [name] on 12/30/16 at 9:15am has been moved to 3:45pm due to a double booking. Told patient to call back and reschedule if that new time does not work for him.

## 2016-11-24 NOTE — Progress Notes (Signed)
Will change  duoneb to PRN and assess BID

## 2016-11-25 ENCOUNTER — Inpatient Hospital Stay: Payer: Medicare Other

## 2016-11-25 ENCOUNTER — Encounter
Admission: RE | Disposition: A | Discharge status: Skilled Nursing Facility | Payer: Self-pay | Source: Home / Self Care | Attending: Specialist

## 2016-11-25 ENCOUNTER — Encounter: Payer: Self-pay | Admitting: Nephrology

## 2016-11-25 DIAGNOSIS — I10 Essential (primary) hypertension: Secondary | ICD-10-CM | POA: Diagnosis present

## 2016-11-25 DIAGNOSIS — E1140 Type 2 diabetes mellitus with diabetic neuropathy, unspecified: Secondary | ICD-10-CM | POA: Diagnosis present

## 2016-11-25 DIAGNOSIS — N189 Chronic kidney disease, unspecified: Secondary | ICD-10-CM | POA: Diagnosis present

## 2016-11-25 DIAGNOSIS — R0602 Shortness of breath: Secondary | ICD-10-CM

## 2016-11-25 DIAGNOSIS — E785 Hyperlipidemia, unspecified: Secondary | ICD-10-CM | POA: Diagnosis present

## 2016-11-25 DIAGNOSIS — E119 Type 2 diabetes mellitus without complications: Secondary | ICD-10-CM | POA: Diagnosis present

## 2016-11-25 DIAGNOSIS — R1010 Upper abdominal pain, unspecified: Secondary | ICD-10-CM

## 2016-11-25 DIAGNOSIS — Z7901 Long term (current) use of anticoagulants: Secondary | ICD-10-CM

## 2016-11-25 DIAGNOSIS — R531 Weakness: Secondary | ICD-10-CM

## 2016-11-25 DIAGNOSIS — J449 Chronic obstructive pulmonary disease, unspecified: Secondary | ICD-10-CM | POA: Diagnosis present

## 2016-11-25 DIAGNOSIS — Z87891 Personal history of nicotine dependence: Secondary | ICD-10-CM

## 2016-11-25 DIAGNOSIS — F319 Bipolar disorder, unspecified: Secondary | ICD-10-CM | POA: Diagnosis present

## 2016-11-25 DIAGNOSIS — N400 Enlarged prostate without lower urinary tract symptoms: Secondary | ICD-10-CM | POA: Diagnosis present

## 2016-11-25 DIAGNOSIS — I4891 Unspecified atrial fibrillation: Secondary | ICD-10-CM

## 2016-11-25 DIAGNOSIS — I639 Cerebral infarction, unspecified: Secondary | ICD-10-CM | POA: Clinically undetermined

## 2016-11-25 HISTORY — PX: PERICARDIOCENTESIS: CATH19

## 2016-11-25 LAB — CBC AND DIFFERENTIAL
Bands: 3 % (ref 0–10)
Bands: 1 % (ref 0–10)
Basophils %: 0 % (ref 0.0–3.0)
Basophils %: 0 % (ref 0.0–3.0)
Basophils Absolute: 0 10*3/uL (ref 0.0–0.3)
Basophils Absolute: 0 10*3/uL (ref 0.0–0.3)
Eosinophils %: 0 % (ref 0.0–7.0)
Eosinophils %: 0 % (ref 0.0–7.0)
Eosinophils Absolute: 0 10*3/uL (ref 0.0–0.8)
Eosinophils Absolute: 0 10*3/uL (ref 0.0–0.8)
Hematocrit: 29.2 % — ABNORMAL LOW (ref 39.0–52.5)
Hematocrit: 30.1 % — ABNORMAL LOW (ref 39.0–52.5)
Hemoglobin: 9.6 gm/dL — ABNORMAL LOW (ref 13.0–17.5)
Hemoglobin: 9.8 gm/dL — ABNORMAL LOW (ref 13.0–17.5)
Lymphocytes Absolute: 0.1 10*3/uL — ABNORMAL LOW (ref 0.6–5.1)
Lymphocytes Absolute: 0.8 10*3/uL (ref 0.6–5.1)
Lymphocytes: 1 % — ABNORMAL LOW (ref 15.0–46.0)
Lymphocytes: 5 % — ABNORMAL LOW (ref 15.0–46.0)
MCH: 31 pg (ref 28–35)
MCH: 30 pg (ref 28–35)
MCHC: 33 gm/dL (ref 32–36)
MCHC: 32 gm/dL (ref 32–36)
MCV: 93 fL (ref 80–100)
MCV: 92 fL (ref 80–100)
MPV: 7.5 fL (ref 6.0–10.0)
MPV: 8.5 fL (ref 6.0–10.0)
Monocytes Absolute: 1 10*3/uL (ref 0.1–1.7)
Monocytes Absolute: 2.2 10*3/uL — ABNORMAL HIGH (ref 0.1–1.7)
Monocytes: 7 % (ref 3.0–15.0)
Monocytes: 14 % (ref 3.0–15.0)
Neutrophils %: 89 % — ABNORMAL HIGH (ref 42.0–78.0)
Neutrophils %: 80 % — ABNORMAL HIGH (ref 42.0–78.0)
Neutrophils Absolute: 13.2 10*3/uL — ABNORMAL HIGH (ref 1.7–8.6)
Neutrophils Absolute: 12.7 10*3/uL — ABNORMAL HIGH (ref 1.7–8.6)
Nucleated RBC: 1 /100 WBCs (ref 0–10)
PLT CT: 170 10*3/uL (ref 130–440)
PLT CT: 165 10*3/uL (ref 130–440)
RBC: 3.14 10*6/uL — ABNORMAL LOW (ref 4.00–5.70)
RBC: 3.27 10*6/uL — ABNORMAL LOW (ref 4.00–5.70)
RDW: 15.6 % — ABNORMAL HIGH (ref 11.0–14.0)
RDW: 15.8 % — ABNORMAL HIGH (ref 11.0–14.0)
WBC: 14.3 10*3/uL — ABNORMAL HIGH (ref 4.0–11.0)
WBC: 15.7 10*3/uL — ABNORMAL HIGH (ref 4.0–11.0)

## 2016-11-25 LAB — CROSSMATCH PRBCS, 2 UNITS
01 -   Blood Type: O POS
01 -   Issue Date/Time: 201804160747
02 -   Blood Type: O POS
02 -   Issue Date/Time: 201804160747

## 2016-11-25 LAB — COMPREHENSIVE METABOLIC PANEL
ALT: 78 U/L — ABNORMAL HIGH (ref 0–55)
AST (SGOT): 83 U/L — ABNORMAL HIGH (ref 10–42)
Albumin/Globulin Ratio: 1 Ratio (ref 0.70–1.50)
Albumin: 3.3 gm/dL — ABNORMAL LOW (ref 3.5–5.0)
Alkaline Phosphatase: 66 U/L (ref 40–145)
Anion Gap: 17.9 mMol/L (ref 7.0–18.0)
BUN / Creatinine Ratio: 19.8 Ratio (ref 10.0–30.0)
BUN: 75 mg/dL — ABNORMAL HIGH (ref 7–22)
Bilirubin, Total: 1.4 mg/dL — ABNORMAL HIGH (ref 0.1–1.2)
CO2: 19.4 mMol/L — ABNORMAL LOW (ref 20.0–30.0)
Calcium: 8.9 mg/dL (ref 8.5–10.5)
Chloride: 102 mMol/L (ref 98–110)
Creatinine: 3.79 mg/dL — ABNORMAL HIGH (ref 0.80–1.30)
EGFR: 15 mL/min/{1.73_m2} — ABNORMAL LOW (ref 60–150)
Globulin: 3.3 gm/dL (ref 2.0–4.0)
Glucose: 230 mg/dL — ABNORMAL HIGH (ref 71–99)
Osmolality Calc: 296 mOsm/kg (ref 275–300)
Potassium: 6.3 mMol/L (ref 3.5–5.3)
Protein, Total: 6.6 gm/dL (ref 6.0–8.3)
Sodium: 133 mMol/L — ABNORMAL LOW (ref 136–147)

## 2016-11-25 LAB — I-STAT CG4 ARTERIAL CARTRIDGE
BE, ISTAT: -6 mMol/L — ABNORMAL LOW (ref <=2)
HCO3, ISTAT: 18.5 mMol/L — ABNORMAL LOW (ref 20.0–29.0)
Lactic Acid I-Stat: 2.74 mMol/L (ref 0.50–2.10)
O2 Sat, %, ISTAT: 95 % — ABNORMAL LOW (ref 96–100)
PCO2, ISTAT: 33.2 mm Hg — ABNORMAL LOW (ref 35.0–45.0)
PO2, ISTAT: 77 mm Hg (ref 75–100)
Room Number I-Stat: 310
TCO2 I-Stat: 19 mMol/L — ABNORMAL LOW (ref 24–29)
i-STAT FIO2: 0 %
i-STAT Liters Per Minute: 3 L/min
pH, ISTAT: 7.36 (ref 7.35–7.45)

## 2016-11-25 LAB — BASIC METABOLIC PANEL
Anion Gap: 20.8 mMol/L — ABNORMAL HIGH (ref 7.0–18.0)
Anion Gap: 19.4 mMol/L — ABNORMAL HIGH (ref 7.0–18.0)
BUN / Creatinine Ratio: 20.2 Ratio (ref 10.0–30.0)
BUN / Creatinine Ratio: 19.8 Ratio (ref 10.0–30.0)
BUN: 85 mg/dL — ABNORMAL HIGH (ref 7–22)
BUN: 91 mg/dL — ABNORMAL HIGH (ref 7–22)
CO2: 15.6 mMol/L — ABNORMAL LOW (ref 20.0–30.0)
CO2: 20.1 mMol/L (ref 20.0–30.0)
Calcium: 9.4 mg/dL (ref 8.5–10.5)
Calcium: 8.8 mg/dL (ref 8.5–10.5)
Chloride: 104 mMol/L (ref 98–110)
Chloride: 102 mMol/L (ref 98–110)
Creatinine: 4.21 mg/dL — ABNORMAL HIGH (ref 0.80–1.30)
Creatinine: 4.6 mg/dL — ABNORMAL HIGH (ref 0.80–1.30)
EGFR: 14 mL/min/{1.73_m2} — ABNORMAL LOW (ref 60–150)
EGFR: 12 mL/min/{1.73_m2} — ABNORMAL LOW (ref 60–150)
Glucose: 169 mg/dL — ABNORMAL HIGH (ref 71–99)
Glucose: 133 mg/dL — ABNORMAL HIGH (ref 71–99)
Osmolality Calc: 298 mOsm/kg (ref 275–300)
Osmolality Calc: 302 mOsm/kg — ABNORMAL HIGH (ref 275–300)
Potassium: 6.4 mMol/L (ref 3.5–5.3)
Potassium: 5.5 mMol/L — ABNORMAL HIGH (ref 3.5–5.3)
Sodium: 134 mMol/L — ABNORMAL LOW (ref 136–147)
Sodium: 136 mMol/L (ref 136–147)

## 2016-11-25 LAB — SODIUM, URINE, RANDOM: Urine Sodium Random: 38 mMol/L

## 2016-11-25 LAB — HEMOGLOBIN: Hemoglobin: 10.5 gm/dL — ABNORMAL LOW (ref 13.0–17.5)

## 2016-11-25 LAB — VH DEXTROSE STICK GLUCOSE
Glucose POCT: 254 mg/dL — ABNORMAL HIGH (ref 71–99)
Glucose POCT: 184 mg/dL — ABNORMAL HIGH (ref 71–99)
Glucose POCT: 167 mg/dL — ABNORMAL HIGH (ref 71–99)

## 2016-11-25 LAB — VH CELL COUNT BODY FLUID
Body Fluid Lymphocytes: 5 %
Body Fluid Monocytes: 5 %
Body Fluid Neutrophil Count: 90 %
Body Fluid RBC: 2489970 /mm3
Total NUCLEATED CELL COUNT: 15187 /mm3

## 2016-11-25 LAB — CBC
Hematocrit: 28.2 % — ABNORMAL LOW (ref 39.0–52.5)
Hemoglobin: 8.9 gm/dL — ABNORMAL LOW (ref 13.0–17.5)
MCH: 29 pg (ref 28–35)
MCHC: 32 gm/dL (ref 32–36)
MCV: 93 fL (ref 80–100)
MPV: 8.3 fL (ref 6.0–10.0)
PLT CT: 162 10*3/uL (ref 130–440)
RBC: 3.03 10*6/uL — ABNORMAL LOW (ref 4.00–5.70)
RDW: 15.9 % — ABNORMAL HIGH (ref 11.0–14.0)
WBC: 14.1 10*3/uL — ABNORMAL HIGH (ref 4.0–11.0)

## 2016-11-25 LAB — MAGNESIUM: Magnesium: 2.9 mg/dL — ABNORMAL HIGH (ref 1.6–2.6)

## 2016-11-25 LAB — CREATININE, URINE, RANDOM: Urine Creatinine Random: 121.42 mg/dL

## 2016-11-25 SURGERY — PERICARDIOCENTESIS
Anesthesia: Local | Site: Chest | Laterality: Left

## 2016-11-25 MED ORDER — VH SODIUM CHLORIDE BACTERIOSTATIC 0.9 % IJ SOLN (WRAPPED)
INTRAMUSCULAR | Status: AC
Start: 2016-11-25 — End: ?
  Filled 2016-11-25: qty 30

## 2016-11-25 MED ORDER — RIVAROXABAN 15 MG PO TABS
15.0000 mg | ORAL_TABLET | Freq: Every day | ORAL | Status: DC
Start: 2016-11-25 — End: 2016-11-25
  Filled 2016-11-25: qty 1

## 2016-11-25 MED ORDER — SODIUM BICARBONATE 650 MG PO TABS
650.0000 mg | ORAL_TABLET | Freq: Two times a day (BID) | ORAL | Status: DC
Start: 2016-11-25 — End: 2016-11-29
  Administered 2016-11-25 – 2016-11-29 (×9): 650 mg via ORAL
  Filled 2016-11-25 (×10): qty 1

## 2016-11-25 MED ORDER — INSULIN REGULAR HUMAN 100 UNIT/ML IJ SOLN
10.0000 [IU] | Freq: Once | INTRAMUSCULAR | Status: AC
Start: 2016-11-25 — End: 2016-11-25
  Administered 2016-11-25: 13:00:00 10 [IU] via INTRAVENOUS
  Filled 2016-11-25: qty 3

## 2016-11-25 MED ORDER — ALBUTEROL-IPRATROPIUM 2.5-0.5 (3) MG/3ML IN SOLN
3.0000 mL | Freq: Four times a day (QID) | RESPIRATORY_TRACT | Status: DC
Start: 2016-11-25 — End: 2016-12-01
  Administered 2016-11-25 – 2016-11-30 (×19): 3 mL via RESPIRATORY_TRACT
  Filled 2016-11-25 (×24): qty 3

## 2016-11-25 MED ORDER — SODIUM CHLORIDE 0.9 % IV BOLUS
500.0000 mL | Freq: Once | INTRAVENOUS | Status: AC
Start: 2016-11-25 — End: 2016-11-25
  Administered 2016-11-25: 09:00:00 500 mL via INTRAVENOUS

## 2016-11-25 MED ORDER — LIDOCAINE HCL (PF) 1 % IJ SOLN
INTRAMUSCULAR | Status: AC
Start: 2016-11-25 — End: 2016-11-26
  Filled 2016-11-25: qty 10

## 2016-11-25 MED ORDER — DEXTROSE 10 % IV BOLUS
125.0000 mL | INTRAVENOUS | Status: DC | PRN
Start: 2016-11-25 — End: 2016-12-09

## 2016-11-25 MED ORDER — DEXTROSE 50 % IV SOLN
50.0000 mL | Freq: Once | INTRAVENOUS | Status: AC
Start: 2016-11-25 — End: 2016-11-25
  Administered 2016-11-25: 13:00:00 50 mL via INTRAVENOUS
  Filled 2016-11-25: qty 50

## 2016-11-25 MED ORDER — HEPARIN (PORCINE) IN NACL 2-0.9 UNIT/ML-% IJ SOLN
INTRAMUSCULAR | Status: AC
Start: 2016-11-25 — End: 2016-11-25
  Filled 2016-11-25: qty 500

## 2016-11-25 MED ORDER — INSULIN ASPART 100 UNIT/ML SC SOPN
2.0000 [IU] | PEN_INJECTOR | Freq: Four times a day (QID) | SUBCUTANEOUS | Status: DC | PRN
Start: 2016-11-25 — End: 2016-12-09
  Administered 2016-11-25: 2 [IU] via SUBCUTANEOUS
  Administered 2016-11-26: 18:00:00 4 [IU] via SUBCUTANEOUS
  Administered 2016-11-26: 05:00:00 2 [IU] via SUBCUTANEOUS
  Administered 2016-11-26: 15:00:00 4 [IU] via SUBCUTANEOUS
  Administered 2016-11-27: 23:00:00 7 [IU] via SUBCUTANEOUS
  Administered 2016-11-27: 17:00:00 4 [IU] via SUBCUTANEOUS
  Administered 2016-11-27: 08:00:00 2 [IU] via SUBCUTANEOUS
  Administered 2016-11-27: 12:00:00 4 [IU] via SUBCUTANEOUS
  Administered 2016-11-28: 09:00:00 2 [IU] via SUBCUTANEOUS
  Administered 2016-11-28: 22:00:00 4 [IU] via SUBCUTANEOUS
  Administered 2016-11-28 – 2016-11-29 (×5): 2 [IU] via SUBCUTANEOUS
  Administered 2016-11-30 (×3): 4 [IU] via SUBCUTANEOUS
  Administered 2016-11-30: 22:00:00 2 [IU] via SUBCUTANEOUS
  Administered 2016-11-30 – 2016-12-01 (×3): 4 [IU] via SUBCUTANEOUS
  Administered 2016-12-01: 21:00:00 7 [IU] via SUBCUTANEOUS
  Administered 2016-12-02 – 2016-12-03 (×2): 2 [IU] via SUBCUTANEOUS
  Administered 2016-12-04: 17:00:00 7 [IU] via SUBCUTANEOUS
  Administered 2016-12-04 – 2016-12-05 (×3): 2 [IU] via SUBCUTANEOUS
  Administered 2016-12-05: 21:00:00 4 [IU] via SUBCUTANEOUS
  Administered 2016-12-06: 13:00:00 2 [IU] via SUBCUTANEOUS
  Administered 2016-12-06: 22:00:00 4 [IU] via SUBCUTANEOUS
  Administered 2016-12-06 – 2016-12-07 (×3): 2 [IU] via SUBCUTANEOUS
  Administered 2016-12-07: 18:00:00 4 [IU] via SUBCUTANEOUS
  Administered 2016-12-08 (×2): 2 [IU] via SUBCUTANEOUS
  Administered 2016-12-08 – 2016-12-09 (×2): 4 [IU] via SUBCUTANEOUS

## 2016-11-25 MED ORDER — SODIUM CHLORIDE 0.9 % IV SOLN
INTRAVENOUS | Status: DC
Start: 2016-11-25 — End: 2016-11-29

## 2016-11-25 MED ORDER — CALCIUM GLUCONATE-NACL 1-0.9 GM/100ML-% IV SOLN
1.0000 g | Freq: Once | INTRAVENOUS | Status: AC
Start: 2016-11-25 — End: 2016-11-25
  Administered 2016-11-25: 10:00:00 1 g via INTRAVENOUS
  Filled 2016-11-25: qty 100

## 2016-11-25 MED ORDER — SODIUM CHLORIDE 0.9 % IV SOLN
INTRAVENOUS | Status: DC
Start: 2016-11-25 — End: 2016-11-25

## 2016-11-25 MED ORDER — SODIUM CHLORIDE 0.45 % IV SOLN
100.0000 mL/h | INTRAVENOUS | Status: DC
Start: 2016-11-25 — End: 2016-11-26
  Administered 2016-11-25: 13:00:00 100 mL/h via INTRAVENOUS
  Filled 2016-11-25 (×5): qty 1000

## 2016-11-25 MED ORDER — GLUCAGON 1 MG IJ SOLR (WRAP)
1.0000 mg | INTRAMUSCULAR | Status: DC | PRN
Start: 2016-11-25 — End: 2016-12-09

## 2016-11-25 MED ORDER — VH NICARDIPINE HCL IN NACL 40-0.83 MG/200 ML-% IV SOLN
5.0000 mg/h | INTRAVENOUS | Status: DC
Start: 2016-11-25 — End: 2016-11-29
  Administered 2016-11-25: 19:00:00 15 mg/h via INTRAVENOUS
  Filled 2016-11-25: qty 200

## 2016-11-25 MED ORDER — SODIUM POLYSTYRENE SULFONATE 15 GM/60ML PO SUSP
30.0000 g | Freq: Once | ORAL | Status: AC
Start: 2016-11-25 — End: 2016-11-25
  Administered 2016-11-25: 13:00:00 30 g via ORAL
  Filled 2016-11-25: qty 120

## 2016-11-25 NOTE — Progress Notes (Addendum)
Pt breathing more labored. Fine crackles on left side. Speech more delayed than this morning. O2 sat 95% on 3L. BP 109/63. RRT at bedside.

## 2016-11-25 NOTE — Progress Notes (Signed)
Cardiology Progress Note      Date Time: 11/25/16 7:07 PM  [name], [name]      Assessment:     Active Hospital Problems    Diagnosis   . Chronic anticoagulation   . Stroke   . CKD (chronic kidney disease)   . Former smoker, stopped smoking many years ago   . Type 2 diabetes mellitus   . HTN (hypertension)   . HLD (hyperlipidemia)   . Diabetic neuropathy   . COPD (chronic obstructive pulmonary disease)   . Bipolar disorder   . BPH (benign prostatic hyperplasia)   . Atrial fibrillation   . Chronic atrial fibrillation   . Stage 4 chronic kidney disease             Plan:     1. S/P drainage of 700ccs of blood, immediate improvement in hemodynamics, and symptoms, including his right sided weakness- very intriguing.   2. Leaving drain in for 48 hours, and hold off anticoagulants until renal function has improved, and drainage is minimal.       Subjective:   Feels much better. Conversing with pastor and son in law.     Physical Exam:     Temp:  [97.2 F (36.2 C)-97.9 F (36.6 C)] 97.9 F (36.6 C)  Heart Rate:  [72-94] 91  Resp Rate:  [16-23] 22  BP: (84-167)/(58-112) 161/98    Wt Readings from Last 3 Encounters:   11/25/16 86.3 kg (190 lb 4.8 oz)   10/21/16 85.5 kg (188 lb 9.6 oz)   10/07/16 84 kg (185 lb 3.2 oz)            Intake/Output Summary (Last 24 hours) at 11/25/16 1907  Last data filed at 11/25/16 1835   Gross per 24 hour   Intake           698.33 ml   Output              371 ml   Net           327.33 ml       General appearance - alert, well appearing, and in no distress  Chest -clear to auscultation, no wheezes, rales or rhonchi, symmetric air entry  Heart - normal rate, regular rhythm, normal S1, S2, no murmurs, rubs, clicks or gallops  Extremities - peripheral pulses normal, no pedal edema, no clubbing or cyanosis  Abd: soft. Non-tender, non-distended,  Neuro: alert and oriented  Skin: no rashes  Psych: normal affect    Medications:     Scheduled Meds:   lidocaine      acetaminophen  1,000 mg Intravenous Q8H   albuterol-ipratropium 3 mL Nebulization Q6H [place]   amiodarone 200 mg Oral Q12H SCH   divalproex EC/DR tablet 500 mg Oral Q12H SCH   docusate sodium 100 mg Oral Daily   gabapentin 100 mg Oral Q12H SCH   pantoprazole 40 mg Oral BID AC   polyethylene glycol 17 g Oral Daily   pravastatin 40 mg Oral QPM   senna 8.6 mg Oral QHS   sodium bicarbonate 650 mg Oral BID       . sodium chloride     . niCARdipine 15 mg/hr (11/25/16 1835)   . IV fluids with sodium bicarbonate 100 mL/hr (11/25/16 1327)         Labs:       Recent Labs  Lab 11/25/16  1800 11/25/16  1120 11/25/16  0454   Glucose 133* 169* 230*  BUN 91* 85* 75*   Creatinine 4.60* 4.21* 3.79*   Sodium 136 134* 133*   Potassium 5.5* 6.4* 6.3*   Chloride 102 104 102   CO2 20.1 15.6* 19.4*   Magnesium  --  2.9*  --    AST (SGOT)  --   --  83*   ALT  --   --  78*     Estimated Creatinine Clearance: 16.4 mL/min (A) (based on SCr of 4.6 mg/dL (H)).      Recent Labs  Lab 10/21/16  0810   CHOL 91   TRIG 59   HDL 33*   LDL 46         Recent Labs  Lab 11/25/16  1801 11/25/16  1728 11/25/16  1120 11/25/16  0454  11/23/16  0340  11/22/16  1540   WBC 14.1*  --  15.7* 14.3* More results in Results Review 15.2* More results in Results Review 15.0*   Hemoglobin 8.9* 10.5* 9.8* 9.6* More results in Results Review 11.0* More results in Results Review 12.6*   Hematocrit 28.2*  --  30.1* 29.2* More results in Results Review 34.0* More results in Results Review 38.2*   PLT CT 162  --  165 170 More results in Results Review 199 More results in Results Review 221   PT INR  --   --   --   --   --  1.3  --  1.3   More results in Results Review = values in this interval not displayed.    No results for input(s): TROPI, CK, CKMB in the last 8760 hours.    No results for input(s): TSH in the last 8760 hours.    Radiology: all results from this admission  Xr Chest 2 Views    Result Date: 11/15/2016  Cardiomegaly with cephalization of pulmonary vascular flow. No focal  infiltrate, pleural effusion, or pneumothorax. ReadingStation:[email or web address]    Xr Abdomen Ap    Result Date: 11/24/2016  Findings worrisome for partial proximal or mid small bowel obstruction. ReadingStation:[email or web address]    Ct Head Wo Contrast    Result Date: 11/24/2016  1. No evidence of intracranial hemorrhage. 2. Probable subacute infarct superior LEFT frontoparietal lobe gyrus. 3. If clinically indicated, MRI may be helpful in further evaluation. ReadingStation:[email or web address]    Xr Chest Ap Only    Result Date: 11/22/2016  No pneumothorax or significant acute pulmonary abnormalities. Small amount of subcutaneous emphysema in the anterior chest. ReadingStation:[email or web address]    Xr Chest Ap Portable    Result Date: 11/24/2016  Cardiomegaly with bilateral airspace disease or edema with small left effusion. No observed pneumothorax. Interval removal right IJ central line. ReadingStation:[email or web address]    X-ray Chest Ap Portable    Result Date: 11/23/2016  1. Pulmonary vascular congestion without overt failure 2. Left basilar opacity likely combination of atelectasis, consolidation and small effusion. 3. Catheter appliances as above ReadingStation:[email or web address]    Xr Abdomen Portable    Result Date: 11/25/2016  No acute abnormality. [place], DO

## 2016-11-25 NOTE — Progress Notes (Signed)
[facility] updated on Echo results.and latic levels. Report called to [place]. Pt being transported to [id number].

## 2016-11-25 NOTE — Consults (Signed)
Inpatient consult to nephrology  Consult performed by: Donnita Falls  Consult ordered by: Estella Husk          CONSULTATION    Date Time: 11/25/16 9:40 AM  [name], [name]  MRN#: [id number]  DOB: [birth year]  Requesting Physician: [name], MD      Reason for Consultation:   Acute on chronic kidney disease    History:   [name] is a 66 y.o. male who presents to the hospital on 11/22/2016 for ablation of atrial fibrillation. He has a history of persistent atrial fibrillation, and had been cardioverted without success. On 4/16 he underwent hybrid left atrial ablation using bipolar radiofrequency (convergent procedure). He underwent successful conversion to sinus rhythm and then transferred to ICU for postoperative management. He did have significant nausea and vomiting after the procedure, with approximately 600 mL of drainage via his pericardial drain. He denies ever seeing a kidney specialist before. By review of the record, it appears that his baseline creatinine had been 1.4 mg/dL in 7/17 and then 1.8 mg/dL in 1/18 via labs obtained in chair everywhere. On 10/07/16, he was noted to have a further rise in creatinine to 2.1 mg/dL, which stabilized at 2.0 mg/dL on 11/15/16. On initial presentation for this procedure, his creatinine was somewhat below his recent baseline at 1.6 mg/dL. He remained stable there on the morning postoperatively, but by later that day his creatinine had risen to 2.6 mg/dL. On 4/17 he was resumed on home dose of lisinopril 10 mg daily. He received doses on 4/17 and 4/18. In addition, he received torsemide on 4/17 and 4/18. On 4/18, he developed increasing abdominal distention, bloating, and pain. Abdominal x-ray was suggestive of possible small bowel obstruction. He was seen by general surgery, and medical management was advised. He was not felt to require CT scan at that time. Today, the patient's creatinine rose further to 3.79 mg/dL, associated with  hyperkalemia. His blood pressure has been somewhat low overnight and this morning.    Past Medical History:     Past Medical History:   Diagnosis Date   . Arrhythmia    . Arthritis    . Atrial fibrillation    . Atrial flutter    . Bipolar affective    . BPH (benign prostatic hyperplasia)    . Complication of anesthesia     resp. asessment   . Diabetes mellitus    . Diabetic neuropathy    . Gout    . Heart murmur    . HOH (hard of hearing)    . Hyperlipidemia    . Hypertension    . Paroxysmal atrial fibrillation    . Pulmonary hypertension    . Renal insufficiency    . Type 2 diabetes mellitus, controlled    . Wears glasses        Past Surgical History:     Past Surgical History:   Procedure Laterality Date   . APPENDECTOMY     . CARDIAC ABLATION     . CARDIAC CATHETERIZATION     . CARDIOVERSION      x 2   . HERNIA REPAIR     . TEE     . TONSILLECTOMY     . TONSILLECTOMY, ADENOIDECTOMY         Family History:     Family History   Problem Relation Age of Onset   . Hypertension Mother    . Diabetes Mother    .  Heart disease Mother    . Hypertension Sister    . Diabetes Sister    . Hyperlipidemia Sister    . Hypertension Brother    . Heart disease Brother    . Hyperlipidemia Brother    . Hypertension Brother    . Diabetes Brother    . Hyperlipidemia Brother    . Kidney disease Brother    . Hypertension Daughter        Social History:     Social History     Social History   . Marital status: Widowed     Spouse name: N/A   . Number of children: N/A   . Years of education: N/A     Social History Main Topics   . Smoking status: Former Smoker     Packs/day: 0.30     Years: 40.00     Types: Cigarettes     Quit date: 1995   . Smokeless tobacco: Never Used      Comment: 1 pack would last a week   . Alcohol use No   . Drug use: No   . Sexual activity: Not on file     Other Topics Concern   . Not on file     Social History Narrative   . No narrative on file       Allergies:     Allergies   Allergen Reactions   . Codeine Nausea  And Vomiting       Medications:     Current Facility-Administered Medications   Medication Dose Route Frequency   . acetaminophen  1,000 mg Intravenous Q8H   . amiodarone  200 mg Oral Q12H SCH   . amLODIPine  10 mg Oral Daily   . calcium GLUConate  1 g Intravenous Once   . divalproex EC/DR tablet  500 mg Oral Q12H [place]   . docusate sodium  100 mg Oral Daily   . gabapentin  100 mg Oral Q12H [place]   . pantoprazole  40 mg Oral BID AC   . polyethylene glycol  17 g Oral Daily   . pravastatin  40 mg Oral QPM   . senna  8.6 mg Oral QHS   . sodium chloride  500 mL Intravenous Once       Review of Systems:   A comprehensive review of systems was: General ROS: negative for - chills or fever  HEENT ROS: negative for - new hearing or visual disturbances  Hematological and Lymphatic ROS: negative for - bleeding problems or bruising  Endocrine ROS: negative for - thyroid disease  Respiratory ROS: negative for - cough or shortness of breath  Cardiovascular ROS: negative for - chest pain or palpitations  Gastrointestinal ROS: positive for - abdominal pain, bloating, and nausea  Genito-Urinary ROS: negative for - dysuria; Foley catheter removed yesterday  Musculoskeletal ROS: negative for - new muscle or joint complaints  Neurological ROS: negative for - new focal numbness or weakness  Dermatological ROS: negative for pruritus and rash    Physical Exam:   BP 93/66   Pulse 72   Temp 97.9 F (36.6 C) (Oral)   Resp 16   Ht 1.702 m (5\' 7" )   Wt 86.3 kg (190 lb 4.8 oz)   SpO2 96%   BMI 29.81 kg/m     Intake and Output Summary (Last 24 hours) at Date Time    Intake/Output Summary (Last 24 hours) at 11/25/16 0940  Last data filed at 11/25/16 0300  Gross per 24 hour   Intake              605 ml   Output             1325 ml   Net             -720 ml     General appearance - lethargic, but awakens and answers questions, in no distress  Mental status - affect appropriate to mood  Eyes - sclera anicteric  Mouth - mucous membranes  moist  Neck - supple  Chest - clear to auscultation, no wheezes, rales or rhonchi, symmetric air entry, good respiratory effort  Heart - normal rate and regular rhythm, no rub appreciated  Abdomen - soft, distended, tender in right upper quadrant, bowel sounds present  Neurological - moves extremities  Extremities - trace edema  Skin - warm and dry    Labs Reviewed:     Results     Procedure Component Value Units Date/Time    Crossmatch PRBCS, 2 Units [[id number]] Collected:  11/15/16 1110    Specimen:  Blood Updated:  11/25/16 0803     01 - Product ID Red Blood Cells     01 - Unit Number [id number]     01 -   Cross Match Compatible     01 -   Status Info Work In Progress     01 - Product Code P5465K81     01 -   Blood Type O Pos     01 -   Issue Date/Time [id number]     02 - Product ID Red Blood Cells     02 - Unit Number [id number]     02 -   Cross Match Compatible     02 -   Status Info Work In Progress     02 - Product Code [id number]     02 -   Blood Type O Pos     02 -   Issue Date/Time 779390300923    Narrative:       Other: please specify in comments  No special requirements    Dextrose Stick Glucose [[id number]]  (Abnormal) Collected:  11/25/16 0738    Specimen:  Blood Updated:  11/25/16 0755     Glucose, POCT 184 (H) mg/dL     CBC with Automated Differential [[id number]]  (Abnormal) Collected:  11/25/16 0454    Specimen:  Blood from Blood Updated:  11/25/16 0638     WBC 14.3 (H) K/cmm      RBC 3.14 (L) M/cmm      Hemoglobin 9.6 (L) gm/dL      Hematocrit 29.2 (L) %      MCV 93 fL      MCH 31 pg      MCHC 33 gm/dL      RDW 15.6 (H) %      PLT CT 170 K/cmm      MPV 7.5 fL      NEUTROPHIL % 89.0 (H) %      Lymphocytes 1.0 (L) %      Monocytes 7.0 %      Eosinophils % 0.0 %      Basophils % 0.0 %      Bands 3 %      Neutrophils Absolute 13.2 (H) K/cmm      Lymphocytes Absolute 0.1 (L) K/cmm      Monocytes Absolute 1.0 K/cmm      Eosinophils Absolute 0.0 K/cmm  BASO Absolute 0.0 K/cmm      Nucleated RBC 1  /100 WBCs      RBC Morphology RBC Morphology Reviewed Morphology Consistent with Hemogram     Anisocytosis 1+     Polychromasia 1+     Poikilocytosis 1+     Target Cells 1+    Narrative:       Manual differential performed    Comprehensive Metabolic Panel [[id number]]  (Abnormal) Collected:  11/25/16 0454    Specimen:  Plasma Updated:  11/25/16 0557     Sodium 133 (L) mMol/L      Potassium 6.3 (HH) mMol/L      Chloride 102 mMol/L      CO2 19.4 (L) mMol/L      Calcium 8.9 mg/dL      Glucose 230 (H) mg/dL      Creatinine 3.79 (H) mg/dL      BUN 75 (H) mg/dL      Protein, Total 6.6 gm/dL      Albumin 3.3 (L) gm/dL      Alkaline Phosphatase 66 U/L      ALT 78 (H) U/L      AST (SGOT) 83 (H) U/L      Bilirubin, Total 1.4 (H) mg/dL      Albumin/Globulin Ratio 1.00 Ratio      Anion Gap 17.9 mMol/L      BUN/Creatinine Ratio 19.8 Ratio      EGFR 15 (L) mL/min/1.11m2      Osmolality Calc 296 mOsm/kg      Globulin 3.3 gm/dL     Dextrose Stick Glucose [[id number]]  (Abnormal) Collected:  11/25/16 0317    Specimen:  Blood Updated:  11/25/16 0334     Glucose, POCT 254 (H) mg/dL     Dextrose Stick Glucose [[id number]]  (Abnormal) Collected:  11/24/16 2045    Specimen:  Blood Updated:  11/24/16 2101     Glucose, POCT 252 (H) mg/dL     Dextrose Stick Glucose [[id number]]  (Abnormal) Collected:  11/24/16 1244    Specimen:  Blood Updated:  11/24/16 1259     Glucose, POCT 222 (H) mg/dL           Rads:   Radiological Procedure reviewed.     Assessment:     1. AKI - possibly prerenal/hemodynamic in origin. Creatinine continues to rise.  2. Stage III chronic kidney disease-with apparent baseline creatinine 1.6-2.1 mg/dL; most recent creatinine value on outpatient basis had been 2.0 mg/dL on 11/15/16. Suspect underlying diabetic/hypertensive kidney disease.  3. Hyperkalemia-associated with reduced renal function and ACE inhibitor use; also component of hyperglycemia  4. Metabolic acidosis-associated with reduced renal  function  5. Ileus  6. Status post hybrid left atrial ablation (convergent procedure) for atrial fibrillation  7. Hypertension  8. Type 2 diabetes mellitus    Plan:      Given degree of elevated potassium, will provide IV calcium gluconate   Reassess potassium as he has already been given insulin correction this morning for his hyperglycemia-may see some improvement   Add oral sodium bicarbonate   Will avoid oral Kayexalate in this setting given ileus   Give bolus IV normal saline, and continue maintenance rate   Assess urine indices   Hold lisinopril and torsemide in light of AKI    Add hold parameter to amlodipine in light of low blood pressures   Place Foley catheter to ensure no element of urinary retention and to accurately monitor urine output in the setting of  rapidly worsening acute renal failure   May NOT have gadolinium in the setting of AKI due to risk of NSF - discussed with Samuel Bouche from neuro   Will monitor labs closely and make further recommendations as his clinical course dictates    Thank you for the opportunity to participate in the care of your patient. We will follow with you.  Case discussed with RN.    Signed by: Donnita Falls, MD

## 2016-11-25 NOTE — Progress Notes (Signed)
Called for a patient being transferred to the ICU for increase work of breathing.  Attending staff has been at the bedside writing orders.  ABG, chest xray, labs and ultrasound ordered.  [id number] assigned, report called.  The bedside monitor was placed on the patient and the patient was transported to the ICU without issue.

## 2016-11-25 NOTE — Progress Notes (Signed)
Received bedside report and assumed care of pt. IV therapy contacted to obtain 2nd IV access in order to start nicardipine gtt. 3 failed attempts on previous shift. Pt in no acute distress. Family at bedside

## 2016-11-25 NOTE — Progress Notes (Signed)
[place] DAILY PROGRESS NOTE      [name]  Attending Physician: [name], MD    [name] REVIEWED INFORMATION BELOW, DISCUSSED RELEVANT POINTS WITH PATIENTS AND PERSONALLY DEVELOPED PLAN OF CARE.    Assessment/Plan:   POD # 3 S/p hybrid left atrial ablation using bipolar radiofrequency.   BP (!) 84/58   Pulse 77   Temp 97.9 F (36.6 C) (Oral)   Resp 17   Ht 1.702 m (5\' 7" )   Wt 86.3 kg (190 lb 4.8 oz)   SpO2 99%   BMI 29.81 kg/m     Cardiac: RRR, blood pressures soft today. Stat Echo shows large circumferential pericardial effusion. Effusion does not appear to be hemodynamically significant by echo criteria.     Respiratory: increased respiratory effort. ABG: PH 7.36, PCO2 33.2, HCO3 18.5, BE -6, Lacate 2.74    GI: XR abdomen: The bowel gas pattern is now unremarkable. The dilated small bowel loops in the midabdomen have resolved.   Pt reports nausea has resolved at this time.     Electrolytes: Hyperkalemia, K 6.4 elevated. see nephrology note Creatinine 4.21 rising. Urine output minimal. Foley in. Recheck labs after pericardiocentesis    Heme: WBC: 15.7 increased, H/H 9.8, 30.1 PLT 165     Neuro consul per Dr. [name]. MRI ordered but pt was unable to lay flat and exam was not completed.     Dispo: Discussed with [name] and Dr. [name], plan for percutaneous pericardiocentesis this afternoon. Dr. Dyane Dustman will be present in case pericardial window is needed.       Subjective     Pt reports he "feels like junk". C/o increasing SOB, general weakness. He denies chest pain.     Vital Signs:   Vitals:  Temp: 97.9 F (36.6 C)  Heart Rate: 77  BP: (!) 84/58  SpO2: 99 %  O2 Flow Rate (L/min): 3 L/min    Pre-Op Weight: 85.3 kg (188 lb) (11/15/16 1033)  Post-Op Weight: 86.3 kg (190 lb 4.8 oz) (11/25/16 0540)    Recent Labs  Lab 11/25/16  1157 11/25/16  0738 11/25/16  0317 11/24/16  2045 11/24/16  1244   Glucose, POCT 167* 184* 254* 252* 222*          Intake/Output Summary (Last 24 hours) at 11/25/16 1507  Last data filed at 11/25/16 1000   Gross per 24 hour   Intake              365 ml   Output              606 ml   Net             -241 ml       Physical Exam:     General: awake, alert, in mild respiratory distress  Neck: supple  Cardiovascular: regular rate and rhythm, normal S1,S2 no murmurs, rubs or gallops  Lungs:  Decreased breath sounds bilaterally, increased respiratory effort  Abdomen:  normoactive bowel sounds, soft, non-tender, non-distended  Extremities: no clubbing, cyanosis, or edema.  Neuro: Cranial nerves II-XII are grossly intact. Pt has delayed responses. He is alert and oriented x 3.  Right side weakness upper and lower exremity. Moving right lower extremity but delayed response. He is unable to lift right arm. Right hand grip delayed, weak. Speech is normal.     Meds:     Current Facility-Administered Medications   Medication Dose Route Frequency   . acetaminophen  1,000  mg Intravenous Q8H   . amiodarone  200 mg Oral Q12H [facility]   . amLODIPine  10 mg Oral Daily   . divalproex EC/DR tablet  500 mg Oral Q12H [place]   . docusate sodium  100 mg Oral Daily   . gabapentin  100 mg Oral Q12H [place]   . pantoprazole  40 mg Oral BID AC   . polyethylene glycol  17 g Oral Daily   . pravastatin  40 mg Oral QPM   . senna  8.6 mg Oral QHS   . sodium bicarbonate  650 mg Oral BID     Current Facility-Administered Medications   Medication Dose Route Frequency Last Rate   . IV fluids with sodium bicarbonate  100 mL/hr Intravenous Continuous 100 mL/hr (11/25/16 1327)     Current Facility-Administered Medications   Medication Dose Route   . albuterol  2.5 mg Nebulization   . dextrose  125 mL Intravenous   . glucagon (rDNA)  1 mg Intramuscular   . insulin aspart  1-9 Units Subcutaneous   . insulin aspart  2-16 Units Subcutaneous   . morphine  1 mg Intravenous   . ondansetron  4 mg Oral    Or   . ondansetron  4 mg Intravenous   . promethazine  25 mg Oral    Or   .  promethazine  12.5 mg Rectal    Or   . promethazine  6.25 mg Intramuscular   . traMADol  50 mg Oral             Labs:       Recent Labs  Lab 11/25/16  1120 11/25/16  0454 11/24/16  0353 11/23/16  1954 11/23/16  0340   Sodium 134* 133* 134* 135* 142   Potassium 6.4* 6.3* 4.9 5.1 4.2   Chloride 104 102 103 103 111*   CO2 15.6* 19.4* 19.9* 19.5* 23.5   BUN 85* 75* 53* 49* 31*   Creatinine 4.21* 3.79* 2.61* 2.64* 1.62*   EGFR 14* 15* 24* 24* 43*   Calcium 9.4 8.9 9.0 9.1 8.7   Magnesium 2.9*  --   --   --  2.3       Recent Labs  Lab 11/25/16  1120 11/25/16  0454 11/24/16  0353   WBC 15.7* 14.3* 17.0*   RBC 3.27* 3.14* 3.34*   Hemoglobin 9.8* 9.6* 10.1*   Hematocrit 30.1* 29.2* 31.3*   MCV 92 93 94   PLT CT 165 170 191       Recent Labs  Lab 11/23/16  0340 11/22/16  1540   PT 13.2* 13.1*   PT INR 1.3 1.3           Recent Labs  Lab 11/25/16  0454   Bilirubin, Total 1.4*   Protein, Total 6.6   Albumin 3.3*   ALT 78*   AST (SGOT) 83*       Radiology:     Radiology Results (24 Hour)     Procedure Component Value Units Date/Time    XR Abdomen Portable [[id number]] Collected:  11/25/16 0728    Order Status:  Completed Updated:  11/25/16 0730    Narrative:       Clinical History:  Reason For Exam: evaluate small bowel obstruction vs ileus vs constipation    Examination:  XR ABDOMEN PORTABLE    Comparison:  November 24, 2016    Findings:  The bowel gas pattern is now unremarkable. The dilated small bowel loops  in the midabdomen have resolved. Stool and gas in the colon.      Impression:       No acute abnormality.    ReadingStation:[email or web address]    CT Head WO Contrast [[id number]] Collected:  11/24/16 2212    Order Status:  Completed Updated:  11/24/16 2223    Narrative:       Clinical History:  Reason For Exam: Atrial fibrillation status post hybrid left atrial ablation. Mental status changes. Right sided weakness, both right arm and right leg.    Examination:  CT head without intravenous contrast.    CT images were acquired utilizing  Automated Exposure Control for dose reduction.     Comparison:  None available.    Findings:  No evidence of intracranial hemorrhage or mass effect.    Cortical and subcortical decreased attenuation in the superior LEFT frontoparietal lobe gyrus near the central sulcus with gyral enlargement concerning for subacute infarct (coronal series 601, image 48).    Otherwise, there is normal differentiation gray and white matter.     Mild generalized cerebral and cerebellar atrophy with concordant compensatory enlargement of the ventricles, cisterns, and sulci.    Mastoid air cells and middle ears are clear. Small retention cyst in the inferior maxillary sinuses, largest on the left measures 1.8 cm.      Impression:       1. No evidence of intracranial hemorrhage.    2. Probable subacute infarct superior LEFT frontoparietal lobe gyrus.    3. If clinically indicated, MRI may be helpful in further evaluation.    ReadingStation:[email or web address]    XR Abdomen AP [[id number]] Collected:  11/24/16 1731    Order Status:  Completed Updated:  11/24/16 1733    Narrative:       Clinical History:  Reason For Exam: abd distention & pain  Patient has been having abdominal pain and distension. History of Afib, hypertension, diabetes, and hernia repair, appendectomy, and arrhythmia.     Examination:  XR ABDOMEN AP    Comparison:  None available.    Findings:  There is moderate gaseous dilatation of multiple loops of small bowel in the midabdomen. No free gas. No obvious mass.      Impression:       Findings worrisome for partial proximal or mid small bowel obstruction.    ReadingStation:[email or web address]          Leanor Kail, [place]  Date: November 25, 2016  Time: 3:07 PM    Please feel free to contact me or [name] for any further questions or concerns.    [facility]  [phone]

## 2016-11-25 NOTE — Progress Notes (Signed)
11/25/16 1335   Case Management Quick Doc   Case Management Assessment Status Assessment Complete   CM Comments 04/19 POD 3 hybrid left atrial ablation.  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  Previously pt declined [facility] services.  CM will continue to follow and will approach about [facility] again tomorrow   Expected Discharge Date 11/27/16   Physical Discharge Disposition Home, No Needs   Occupational hygienist, BSN  Nurse Case Manager  Phone:  [phone]  Fax:  [phone]

## 2016-11-25 NOTE — PT Plan of Care Note (Signed)
[facility]  Department of Rehabilitation Services  [phone]  Roshad Hack CSN: [id number]  [facility] CARDIAC CATH LAB CATHRM1/CATH1-A    Physical Therapy General Note  Time:     Attempted to see patient for PT eval, H/M as patient has been transferred to the ICU due to large pericardial effusion noted on echo.     Team Communication: electronic chart review    Plan: Will follow up tomorrow per POC.     Jillyn Hidden, PT, DPT

## 2016-11-25 NOTE — Consults (Signed)
[facility]    [facility]  [address]  [place], [place] [address]  [facility]: [phone]   [facility]: [phone]    NEUROLOGY CONSULT  NOTE    Date / Time: 11/25/16 10:53 AM  [name]  Attending Physician: [name], MD      CHIEF COMPLAINT:   Stroke      HISTORY OF PRESENT ILLNESS:   Herve Haug OHY:[id number], MRN: [id number], is a 66 y.o. male, with history of atrial fibrillation on chronic AC with Xarelto, CKD, HTN, HLD, COPD, bipolar disorder, type 2 NIDDM, diabetic neuropathy, BPH, remote hx of tobacco abuse, on whom I have been asked to consult for possible stroke.     The patient was admitted for scheduled afib ablation. Had previously failed cardioversion. Underwent successful procedure on 4/16 and was admitted to ICU following. Transferred to floor on 4/17.     He was reportedly up to bedside for breakfast and lunch yesterday, but complained of inability to walk due to SOB. Complained of abdominal pain/bloating. KUB suggestive of SBO. Access surgery following. Worsening renal function, nephrology on board.     Noted to be less coherent on 4/18 with R sided weakness. 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    Currently, he is resting in bed. Hypotensive this morning. AC is on hold. Complains of low back pain and nausea. Denies known history of prior stroke. Does not use ambulatory devices at baseline, still drives. He believes that his R sided weakness was present upon his awakening yesterday morning. Nursing reports that he is NPO, but has been approved to take medications by mouth with water, not taking much though due to his nausea.    REVIEW OF SYSTEMS:   A comprehensive review of systems was negative except for that which is described in the HPI above and as follows:        CURRENT PROBLEM LIST:     Active Hospital Problems    Diagnosis   . Chronic anticoagulation   . Stroke   . CKD (chronic kidney disease)   . Former  smoker, stopped smoking many years ago   . Type 2 diabetes mellitus   . HTN (hypertension)   . HLD (hyperlipidemia)   . Diabetic neuropathy   . COPD (chronic obstructive pulmonary disease)   . Bipolar disorder   . BPH (benign prostatic hyperplasia)   . Atrial fibrillation       PAST MEDICAL HISTORY:     Past Medical History:   Diagnosis Date   . Arrhythmia    . Arthritis    . Atrial fibrillation    . Atrial flutter    . Bipolar affective    . BPH (benign prostatic hyperplasia)    . Complication of anesthesia     resp. asessment   . Diabetes mellitus    . Diabetic neuropathy    . Gout    . Heart murmur    . HOH (hard of hearing)    . Hyperlipidemia    . Hypertension    . Paroxysmal atrial fibrillation    . Pulmonary hypertension    . Renal insufficiency    . Type 2 diabetes mellitus, controlled    . Wears glasses        FAMILY HISTORY:     Family History   Problem Relation Age of Onset   . Hypertension Mother    . Diabetes Mother    . Heart disease Mother    .  Hypertension Sister    . Diabetes Sister    . Hyperlipidemia Sister    . Hypertension Brother    . Heart disease Brother    . Hyperlipidemia Brother    . Hypertension Brother    . Diabetes Brother    . Hyperlipidemia Brother    . Kidney disease Brother    . Hypertension Daughter         SOCIAL HISTORY:     Social History     Social History   . Marital status: Widowed     Spouse name: N/A   . Number of children: N/A   . Years of education: N/A     Social History Main Topics   . Smoking status: Former Smoker     Packs/[name]: 0.30     Years: 40.00     Types: Cigarettes     Quit date: 1995   . Smokeless tobacco: Never Used      Comment: 1 pack would last a week   . Alcohol use No   . Drug use: No   . Sexual activity: Not Asked     Other Topics Concern   . None     Social History Narrative   . None       ALLERGIES:     Allergies   Allergen Reactions   . Codeine Nausea And Vomiting       MEDICATIONS:     Prior to Admission medications    Medication Sig Start Date End  Date Taking? Authorizing Provider   allopurinol (ZYLOPRIM) 300 MG tablet Take 300 mg by mouth daily.   Yes [provider]   amiodarone (PACERONE) 200 MG tablet Take 200 mg by mouth daily.   Yes [provider]   atenolol (TENORMIN) 100 MG tablet Take 100 mg by mouth 2 (two) times daily.     09/10/16  Yes [provider]   dilTIAZem (CARTIA XT) 240 MG 24 hr capsule Take 240 mg by mouth daily.   Yes [provider]   divalproex EC/DR (DEPAKOTE EC/DR) 500 MG EC tablet Take 500 mg by mouth 2 (two) times daily.   Yes [provider]   furosemide (LASIX) 40 MG tablet Take 40 mg by mouth daily.     07/16/16  Yes [provider]   furosemide (LASIX) 80 MG tablet Take 80 mg by mouth daily.       Yes [provider]   gabapentin (NEURONTIN) 100 MG capsule Take 100 mg by mouth 2 (two) times daily.   Yes [provider]   glimepiride (AMARYL) 1 MG tablet Take 0.5 mg by mouth every morning before breakfast.       Yes [provider]   lisinopril (PRINIVIL,ZESTRIL) 10 MG tablet Take 10 mg by mouth daily.     08/24/16  Yes [provider]   loperamide (IMODIUM) 2 MG capsule Take 2 mg by mouth 2 (two) times daily as needed.       Yes [provider]   metFORMIN (GLUCOPHAGE) 1000 MG tablet Twice daily with food  06/11/16  Yes [provider]   minoxidil (LONITEN) 10 MG tablet Take 30 mg by mouth daily.   Yes [provider]   Multiple Vitamin (MULTIVITAMIN) capsule Take 1 capsule by mouth daily.   Yes [provider]   pravastatin (PRAVACHOL) 40 MG tablet Take 40 mg by mouth every evening.   Yes [provider]   rivaroxaban (XARELTO) 20  MG Tab Take 20 mg by mouth daily with dinner.   Yes [provider]   tamsulosin (FLOMAX) 0.4 MG Cap Take 0.4 mg by mouth daily.   Yes [provider]       Current Facility-Administered Medications   Medication Dose Route Frequency   . acetaminophen   1,000 mg Intravenous Q8H   . amiodarone  200 mg Oral Q12H [facility]   . amLODIPine  10 mg Oral Daily   . divalproex EC/DR tablet  500 mg Oral Q12H [place]   . docusate sodium  100 mg Oral Daily   . gabapentin  100 mg Oral Q12H [place]   . pantoprazole  40 mg Oral BID AC   . polyethylene glycol  17 g Oral Daily   . pravastatin  40 mg Oral QPM   . senna  8.6 mg Oral QHS   . sodium bicarbonate  650 mg Oral BID   . sodium chloride  500 mL Intravenous Once         sodium chloride       PHYSICAL EXAM:     Vitals:    11/25/16 0737   BP: 93/66   Pulse: 72   Resp: 16   Temp:    SpO2: 96%       General: Patient is a 66 yr old white male, in mild distress due to pain/nausea.   Mental status: He is sleepy but easily aroused, lethargic, and oriented x 3 with fluent speech and appropriate affect. Recent and remote memory appear intact.  Cranial Nerve Exam:  CN 2 Normal vision without visual field defects   CN 3,4,6 EOMs intact without nystagmus, PERRLA  CN 5 Facial sensation is symmetrical.  CN 7 Spontaneous facial expressions are symmetrical.  CN 8 Auditory acuity intact bilaterally to casual conversation.  CN 9 Palate rises with phonation.  CN 11 Decreased R shoulder shrug  CN 12 Tongue protrudes in midline.  Motor: Power is 5/5 in left extremities, He has 1-2/5 RUE proximal weakness with 4/5 grip. He has 2-3/5 RLE proximal weakness with 4/5 dorsi and plantar flexion.   Sensory:  Sensation intact to light touch throughout.   Coordination: Rapid alternating movements, coordinated in L extremities, unable to test R extremities.   DTRs: 1+ and symmetric bilaterally  Gait: not assessed    HEENT:  Head is midline, normocephalic, atraumatic.   Cardiac:  Patient in sinus rhythm on tele monitoring.  Chest:  Breath sounds diminished but CTA bilaterally.    Extremities:  No deformities, cyanosis, clubbing or edema. No new rash or bruise.    SIGNIFICANT LABS:     Results     Procedure Component Value Units Date/Time    Sodium, urine, random [[id number]]  Collected:  11/25/16 0959    Specimen:  Urine, Random Updated:  11/25/16 1035     Sodium, UR 38 mMol/L     Creatinine, urine, random [[id number]] Collected:  11/25/16 0959    Specimen:  Urine, Random Updated:  11/25/16 1035     Creatinine, UR 121.42 mg/dL     Crossmatch PRBCS, 2 Units [[id number]] Collected:  11/15/16 1110    Specimen:  Blood Updated:  11/25/16 0803     01 - Product ID Red Blood Cells     01 - Unit Number [id number]     01 -   Cross Match Compatible     01 -   Status Info Work In Progress     01 - Product Code  [id number]     01 -   Blood Type O Pos     01 -   Issue Date/Time [id number]     02 - Product ID Red Blood Cells     02 - Unit Number [id number]     02 -   Cross Match Compatible     02 -   Status Info Work In Progress     02 - Product Code [id number]     02 -   Blood Type O Pos     02 -   Issue Date/Time [id number]    Narrative:       Other: please specify in comments  No special requirements    Dextrose Stick Glucose [[id number]]  (Abnormal) Collected:  11/25/16 0738    Specimen:  Blood Updated:  11/25/16 0755     Glucose, POCT 184 (H) mg/dL     CBC with Automated Differential [[id number]]  (Abnormal) Collected:  11/25/16 0454    Specimen:  Blood from Blood Updated:  11/25/16 0638     WBC 14.3 (H) K/cmm      RBC 3.14 (L) M/cmm      Hemoglobin 9.6 (L) gm/dL      Hematocrit 29.2 (L) %      MCV 93 fL      MCH 31 pg      MCHC 33 gm/dL      RDW 15.6 (H) %      PLT CT 170 K/cmm      MPV 7.5 fL      NEUTROPHIL % 89.0 (H) %      Lymphocytes 1.0 (L) %      Monocytes 7.0 %      Eosinophils % 0.0 %      Basophils % 0.0 %      Bands 3 %      Neutrophils Absolute 13.2 (H) K/cmm      Lymphocytes Absolute 0.1 (L) K/cmm      Monocytes Absolute 1.0 K/cmm      Eosinophils Absolute 0.0 K/cmm      BASO Absolute 0.0 K/cmm      Nucleated RBC 1 /100 WBCs      RBC Morphology RBC Morphology Reviewed Morphology Consistent with Hemogram     Anisocytosis 1+     Polychromasia 1+     Poikilocytosis 1+     Target Cells  1+    Narrative:       Manual differential performed    Comprehensive Metabolic Panel [[id number]]  (Abnormal) Collected:  11/25/16 0454    Specimen:  Plasma Updated:  11/25/16 0557     Sodium 133 (L) mMol/L      Potassium 6.3 (HH) mMol/L      Chloride 102 mMol/L      CO2 19.4 (L) mMol/L      Calcium 8.9 mg/dL      Glucose 230 (H) mg/dL      Creatinine 3.79 (H) mg/dL      BUN 75 (H) mg/dL      Protein, Total 6.6 gm/dL      Albumin 3.3 (L) gm/dL      Alkaline Phosphatase 66 U/L      ALT 78 (H) U/L      AST (SGOT) 83 (H) U/L      Bilirubin, Total 1.4 (H) mg/dL      Albumin/Globulin Ratio 1.00 Ratio      Anion Gap 17.9 mMol/L      BUN/Creatinine Ratio 19.8  Ratio      EGFR 15 (L) mL/min/1.53m2      Osmolality Calc 296 mOsm/kg      Globulin 3.3 gm/dL     Dextrose Stick Glucose [[id number]]  (Abnormal) Collected:  11/25/16 0317    Specimen:  Blood Updated:  11/25/16 0334     Glucose, POCT 254 (H) mg/dL     Dextrose Stick Glucose [[id number]]  (Abnormal) Collected:  11/24/16 2045    Specimen:  Blood Updated:  11/24/16 2101     Glucose, POCT 252 (H) mg/dL     Dextrose Stick Glucose [[id number]]  (Abnormal) Collected:  11/24/16 1244    Specimen:  Blood Updated:  11/24/16 1259     Glucose, POCT 222 (H) mg/dL           SIGNIFICANT IMAGING:     XR Abdomen Portable   Final Result   No acute abnormality.      ReadingStation:[email or web address]      CT Head WO Contrast   Final Result   1. No evidence of intracranial hemorrhage.      2. Probable subacute infarct superior LEFT frontoparietal lobe gyrus.      3. If clinically indicated, MRI may be helpful in further evaluation.      ReadingStation:[email or web address]      XR Abdomen AP   Final Result   Findings worrisome for partial proximal or mid small bowel obstruction.      ReadingStation:[email or web address]      XR Chest AP Portable   Final Result   Cardiomegaly with bilateral airspace disease or edema with small left effusion. No observed pneumothorax. Interval removal right IJ central line.       ReadingStation:[email or web address]      X-ray chest AP portable   Final Result      1. Pulmonary vascular congestion without overt failure   2. Left basilar opacity likely combination of atelectasis, consolidation and small effusion.   3. Catheter appliances as above      ReadingStation:[email or web address]      XR Chest AP Only   Final Result   No pneumothorax or significant acute pulmonary abnormalities.      Small amount of subcutaneous emphysema in the anterior chest.      ReadingStation:[email or web address]      FLUORO-NO CHARGE (HYBRID ROOM ONLY)   Final Result      Echocardiogram [place]    (Results Pending)   MRI Brain WO Contrast    (Results Pending)   MR Angiogram Head WO Contrast    (Results Pending)   MR Angiogram Neck WO Contrast    (Results Pending)           ASSESSMENT AND PLAN:   [name], 66 yr old male, who presents with R side dominant weakness, likely acute CVA.     1. Diagnostics:  1. MRI brain  2. MRA head/neck wo contrast  2. Labs: will check thyroid, B12, folate, lipids, HA1c, ammonia  3. Medications: Will need to continue his anticoagulation when appropriate, statin. Start IV fluids.   4. Rehabilitation services: PT/OT  5. Consults:    1. Nephrology following  2. Access following  6. Vitals and neuro checks q 4 hrs    Case discussed with Dr. Randall An who will additionally review diagnostics/labs, personally evaluate the patient, further determine the treatment plan, and amend this note as indicated. Thank you for the consultation.    Melina Fiddler, PA-C  11/25/2016  10:53 AM  [phone]  I reviewed the record by Samuel Bouche physician assistant and evaluated the patient personally  See my dictated consultation to follow.  Dr. Randall An

## 2016-11-25 NOTE — Progress Notes (Signed)
DAILY PROGRESS NOTE - ACCESS   Name:  [name], HAVARD     DOB:  22-Dec-1950   MR#:  [id number]               ROOM: 3548/3548-A    DATE:  11/25/16      Principal Diagnosis:  <principal problem not specified>    Refer to below for diagnoses being addressed for this encounter    CONDITION:  unchanged     ASSESSMENT & PLAN:                                                              Hospital Day: 4    Likely mild ileus.    Follow up AXR showing resolution of obstructive picture.  There appears to be evidence of constipation on films.  Recommend aggressive bowel regimen.  No plans for surgical intervention.  Ok to give oral meds and clears as tolerated.         Charlestine Night, MD     ACCESS [facility]    [phone]  or  [phone]     Subjective/Chief Complaint:   Upper abd pain (improved).  Appears to be near inferior aspect of midsternal incision and drain insertion site.  Shortness of breath with lying down.  Flatus, no BM.  Being treated for hyperK.  Concern for acute onset R sided weakness.    ROS:    Admits to SOB/DOE, nausea and incisional pain  Denies fever/chills, dysuria, diarrhea and vomiting    Meds:    acetaminophen 1,000 mg Intravenous Q8H   albuterol-ipratropium 3 mL Nebulization Q6H [place]   amiodarone 200 mg Oral Q12H SCH   divalproex EC/DR tablet 500 mg Oral Q12H [place]   docusate sodium 100 mg Oral Daily   gabapentin 100 mg Oral Q12H SCH   lidocaine      pantoprazole 40 mg Oral BID AC   polyethylene glycol 17 g Oral Daily   pravastatin 40 mg Oral QPM   senna 8.6 mg Oral QHS   sodium bicarbonate 650 mg Oral BID         EXAM:   Vitals:  Blood pressure (!) 161/98, pulse 91, temperature 98.7 F (37.1 C), temperature source Oral, resp. rate 22, height 1.702 m (5\' 7" ), weight 86.3 kg (190 lb 4.8 oz), SpO2 97 %.   General:   well-nourished, in no apparent distress, non-toxic  Pulmonary:   mild rhonchi in bilateral lung fields, equal  Cardiac:   regular rate and rhythm without murmurs/rubs/gallops  Abdomen:   hypoactive  bowel sounds, soft, mild to moderate epigastric tenderness without rebound and without guarding, non-distended  Neuro:   drowsy, weakness of RUE noted.  Psych:   insight fair  Extremities:   1+ edema BLE and peripheral pulses 2+ and symmetric  HEENT:   normocephalic, pupils equal @ 3 mm, pupils reactive, EOMI, sclera clear and anicteric, oropharynx clear, oral mucosa is pink and moist  Neck:   supple, Normal range of motion and Trachea midline  Skin:   no jaundice, no rashes    Lab Data Reviewed:  Yes -     Lab results have been reviewed as follows:  CHEM:     Recent Labs  Lab 11/25/16  1800 11/25/16  1120 11/25/16  0454 11/24/16  3664 11/23/16  1954 11/23/16  0340   Glucose 133* 169* 230* 187* 229* 120*   Sodium 136 134* 133* 134* 135* 142   Potassium 5.5* 6.4* 6.3* 4.9 5.1 4.2   Chloride 102 104 102 103 103 111*   CO2 20.1 15.6* 19.4* 19.9* 19.5* 23.5   BUN 91* 85* 75* 53* 49* 31*   Creatinine 4.60* 4.21* 3.79* 2.61* 2.64* 1.62*   Calcium 8.8 9.4 8.9 9.0 9.1 8.7   Magnesium  --  2.9*  --   --   --  2.3     CBC:       Recent Labs  Lab 11/25/16  1801 11/25/16  1728 11/25/16  1120 11/25/16  0454 11/24/16  0353 11/23/16  1954   WBC 14.1*  --  15.7* 14.3* 17.0* 19.4*   Hemoglobin 8.9* 10.5* 9.8* 9.6* 10.1* 10.3*   Hematocrit 28.2*  --  30.1* 29.2* 31.3* 31.0*   PLT CT 162  --  165 170 191 203   MCV 93  --  92 93 94 94     BANDS:    Recent Labs  Lab 11/25/16  1120 11/25/16  0454   Bands 1 3     POCT:    Recent Labs  Lab 11/25/16  1157 11/25/16  0738 11/25/16  0317 11/24/16  2045 11/24/16  1244   Glucose, POCT 167* 184* 254* 252* 222*     LFTs:      Recent Labs  Lab 11/25/16  0454   ALT 78*   AST (SGOT) 83*   Alkaline Phosphatase 66   Bilirubin, Total 1.4*   Albumin 3.3*     COAG:    Recent Labs  Lab 11/23/16  0340 11/22/16  1540   PT INR 1.3 1.3   PT 13.2* 13.1*   aPTT  --  33.5     Lactate:        Cultures:  None     Radiology:   Ct Head Wo Contrast    Result Date: 11/24/2016  1. No evidence of intracranial hemorrhage.  2. Probable subacute infarct superior LEFT frontoparietal lobe gyrus. 3. If clinically indicated, MRI may be helpful in further evaluation. ReadingStation:[id number]    Xr Abdomen Portable    Result Date: 11/25/2016  No acute abnormality. ReadingStation:[id number]

## 2016-11-25 NOTE — Progress Notes (Signed)
[facility]   Daily Progress Note      Patient Name: [name], [name]  Date/Time: 11/25/16 5:51 PM  Attending Physician: [name], MD  Location/Room: 3548/3548-A     Interval Events:   Status post placement of a catheter into the pericardial space with subsequent drainage of approximately 700 mL of serosanguineous fluid from the pericardial space  Patient expressed a prompt improvement in hemodynamic status  Previous complaints of right sided weakness improved right lower greater than right upper with mild residual right upper extremity weakness    Subjective:   Denies chest pain reports slight shortness of breath at rest  No productive cough    Problem List:     Active Hospital Problems    Diagnosis   . Chronic anticoagulation   . Stroke   . CKD (chronic kidney disease)   . Former smoker, stopped smoking many years ago   . Type 2 diabetes mellitus   . HTN (hypertension)   . HLD (hyperlipidemia)   . Diabetic neuropathy   . COPD (chronic obstructive pulmonary disease)   . Bipolar disorder   . BPH (benign prostatic hyperplasia)   . Atrial fibrillation   . Chronic atrial fibrillation   . 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  The procedure itself was uncomplicated.  The patient was  extubated without difficulty.  On arrival to the intensive care unit, a 500 cc bolus of fluid was drained from the mediastinal via the pericardial drain.  It is unclear whether or not the collection of blood was related pooling during the  ablation procedure or whether it represented active ongoing bleeding.  There was no evidence of pericardial tamponade via abnormal hemodynamics and there was no evidence of an enlarging cardiac silhouette. Initially, the patient did well but then begin expressing increasing dyspnea and worsening renal function. A repeat echocardiogram this morning revealed a large circumferential pericardial effusion. He developed a new right hemiplegia right upper greater than right lower and was sent for a CT scan of the brain on 11/24/2016 which showed a likely subacute infarct of the superior left frontoparietal lobe gyrus   Plan:     Cardiovascular: As per discussion with neurology and cardiology, will begin nicardipine to keep BP systolic less than 941     Pulmonary: Remote history of tobacco abuse at this suggestion of severe obstructive airways disease based on the optic spirometry studies not available for review)     Gastrointestinal: Keep nothing by mouth for now, we will reevaluate in a.m.     Infectious Disease: No active issues     Neurologic: Patient with possible extension of previous CVA involving the superior left frontoparietal lobe gyrus versus reversibly increased neuro deficits secondary to hypoperfusion. Will control blood pressure to keep BP systolic less than 740     Renal: Fluid balance is approximately 700 mL negative over the last 24 hours. Urine output 1325 mL over last 24 hours with a marked decrease in urine output earlier today which is now improving. Suspect AK I secondary to hypoperfusion injury. Blood pressure is improved. Will continue moderate IV fluids as  per discussion with nephrology. We'll try to maintain a robust blood pressure reck keep BP systolic less than 540     Hem/Onc: Hemoglobin/hematocrit stable, repeat hemoglobin postprocedure is acceptable, platelets acceptable. Continue SCDs/teds     Endocrine: Serial serum glucoses are moderately elevated. We'll increase fine scale insulin  coverage     ICU Management:  Sedation: None needed.  Nutrition: NPO.  GI Prophylaxis: PPI bid  VTE Prophylaxis: SCDs/Pneumatic Stockings  Foley Catheter: continue    Disposition: Keep in ICU  Code Status: Full Code    Physical Exam:   Vitals: BP 167/90   Pulse 87   Temp 97.9 F (36.6 C) (Oral)   Resp (!) 23   Ht 1.702 m (5\' 7" )   Wt 86.3 kg (190 lb 4.8 oz)   SpO2 (!) 79%   BMI 29.81 kg/m   I/O:   Intake/Output Summary (Last 24 hours) at 11/25/16 1751  Last data filed at 11/25/16 1700   Gross per 24 hour   Intake              125 ml   Output              231 ml   Net             -106 ml     Vent settings:      General Appearance:  Well-developed, well-nourished 66 year old white male who is alert and oriented 3 in no apparent respiratory distress     Mental status: Alert, oriented 3     Neuro:Mild right hemiplegia right upper greater than right lower     HEENT:NCAT     Neck: No cervical adenopathy     Lungs:Breath sounds are normal, few coarse rales at lung bases, no rhonchi, no wheezing     Cardiac: Regular rate and rhythm, S1, S2, no S3, no S4, no murmurs, rubs or gallops     Abdomen: Bowel sounds are normal, abdomen is soft, distended, nontender with no gross hepatosplenomegaly     Extremities:No cyanosis, clubbing or edema     Skin: No acute skin breakdown appreciated     Review of Systems:   Pertinent items are noted in HPI. All other systems are negative.    Labs:   Interval Labs in the [facility] record were reviewed including the following:  CBC:   Recent Labs  Lab 11/25/16  1728 11/25/16  1120 11/25/16  0454 11/24/16  0353   WBC  --  15.7* 14.3* 17.0*   RBC  --  3.27* 3.14* 3.34*   Hemoglobin 10.5* 9.8* 9.6* 10.1*   Hematocrit  --  30.1* 29.2* 31.3*   MCV  --  92 93 94   PLT CT  --  165 170 191     Chemistry:   Recent Labs  Lab 11/25/16  1120 11/25/16  0454 11/24/16  0353 11/23/16  1954 11/23/16  0340   Sodium 134* 133* 134* 135* 142   Potassium 6.4* 6.3* 4.9 5.1 4.2   Chloride 104 102 103 103 111*    CO2 15.6* 19.4* 19.9* 19.5* 23.5   BUN 85* 75* 53* 49* 31*   Creatinine 4.21* 3.79* 2.61* 2.64* 1.62*   Glucose 169* 230* 187* 229* 120*   Calcium 9.4 8.9 9.0 9.1 8.7   Magnesium 2.9*  --   --   --  2.3     LFTs:   Recent Labs  Lab 11/25/16  0454   ALT 78*   AST (  SGOT) 83*   Bilirubin, Total 1.4*   Albumin 3.3*   Alkaline Phosphatase 66     Cardiac Enzymes:     Coags:   Recent Labs  Lab 11/23/16  0340 11/22/16  1540   PT INR 1.3 1.3   PT 13.2* 13.1*       Radiology / Imaging:     Imaging personally reviewed by me, including CXR: No chest x-ray    Attestation & Billing:   Patient's condition and plan discussed with: patient, family and consultants    This patient has a high probability of sudden clinically significant deterioration which requires the highest level of physician preparedness to intervene urgently. I managed/supervised life or organ supporting interventions that required frequent physician assessments. I devoted my full attention in the ICU to the direct care of this patient for this period of time.    Any critical care time was performed today and is exclusive of teaching, billable procedures, and not overlapping with any other providers.    Billing: Critical care time: 50 minutes.    Signed by: Robyn Haber, MD   HY:[id number], Delphia Grates, MD

## 2016-11-25 NOTE — Progress Notes (Signed)
11/25/16 0610   Provider Notification   Reason for Communication Critical lab value   Time of Critical Value Notification 0601   Critical Lab Chemistry   Critical Lab Value potassium 6.3   Total Notification Time to LIP (minutes) 11   Provider Name Finis Bud PA   Provider Role PA/NP   Method of Communication Call   Readback Results Yes   Response No new orders   Notification Time 0612    Advised will be seen by provider soon

## 2016-11-25 NOTE — Progress Notes (Signed)
Patient from floor at approximately 1445. Lethargic but oriented X 4. SBP 80-100 with MAP 60-65. Minimal urine output via foley. Sent patient to cath lab for pericardiocentesis approx. 1515. Pt returned from cath lab with pericardial drain in place. Bloody drainage noted in bulb-75cc emptied. Patient states "I feel much better now. I'm just tired." SBP now running 130-150 with MAP >65. Right arm and leg remain slightly weaker than left. Right eyelid still droopy. Will continue to monitor.

## 2016-11-25 NOTE — Procedures (Signed)
[facility]    Echo guided Pericardiocentesis   Report    Patient Name: [facility] Number: [id number]  CSN:   [id number]  Date of Birth:  [birth year]  Date of Service: 11/25/2016     Performing Provider:  [name], MD  Requesting Provider: Dr. [name]    Procedure performed: Echo-guided pericardiocentesis    Status:  Emergent    Indication: Pericardial tamponade    Access:  Anterior chest    Closure :  Drain left in place    Sedation:  Conscious Sedation    Moderate conscious Sedation:  Moderate conscious sedation was performed. A preprocedural assessment of the patient was completed including review of past medical/surgical history,previous anesthesia sedation experience and family history. Patient's medications and drug allergies were reviewed. Patient examined with vital signs reviewed.  Incremental amounts of medications given to attain moderate level of consciousness.There was continuous face-to-face attendance by myself with trained nurse  monitoring patient's consciousness and physiologic status throughout the procedure.     My total intraservice face-to-face time for moderate sedation: 20 Minutes.    See cath lab scanned report for equipment list, additional medications used, contrast volume, and fluoroscopy time.      FINDINGS:    Anterior chest approach with US guidance. 68 Cook needle directed into pericardial space followed by a wire, dilator, then pigtail drain catheter. Retrieved approx 700 cc dark bloody fluid with pigtail catheter. Sent for HCT only. Repeat U/S showed resolution of pericardial effusion.      Complications:  None    Impression:    1. Successful pericardiocentesis    Recommendations:   1. Drain in place x 24 hours, recheck U/S 24 hours    Melvern Banker, MD, [facility], [facility]  Pager [phone]; [phone]      3:55 PM 11/25/2016    Note: This chart was generated by the Epic EMR system/speech recognition and may contain inherent errors or omissions not  intended by the user. Grammatical errors, random word insertions, deletions, pronoun errors and incomplete sentences are occasional consequences of this technology due to software limitations. Not all errors are caught or corrected. If there are questions or concerns about the content of this note or information contained within the body of this dictation they should be addressed directly with the author for clarification.

## 2016-11-25 NOTE — Progress Notes (Signed)
Updated by nursing regarding lab recheck  Potassium remains elevated - will give D50 and 10 units regular insulin  Give another bolus of NS as BP remains soft  Switch to bicarbonate containing IVF for 1 L in light of decreased serum CO2 then resume NS  Since hyperkalemia persists and abd film this morning had improved, will give dose of Kayexalate  Spoke with Dr. Tia Masker who will recheck patient and notify me if Kayexalate not appropriate  Will reassess labs this afternoon

## 2016-11-25 NOTE — OT Plan of Care Note (Signed)
[place] [address]  Department of Rehabilitation Services  [phone]    Jeyden Coffelt CSN: [id number]  SURG TELEMETRY STEP-DOWN 310/310-A    Occupational Therapy General Note  Time: 2:15 PM    Attempted to see pt for OT evaluation, however pt has experienced a medical decline and is being transferred to CCU.  Pt not medically appropriate for OT evaluation at this.  Will monitor pt's medical status and follow up as appropriate.     Team communication: Maudie Mercury, RN    Plan: Will follow up for OT evaluation, as medically appropriate.     Clyda Hurdle, OTR/L

## 2016-11-25 NOTE — Plan of Care (Signed)
Problem: Altered GI Function  Goal: Elimination patterns are normal or improving   11/25/16 0420   Goal/Interventions addressed this shift   Elimination patterns are normal or improving Report abnormal assessment to physician;Anticipate/assist with toileting needs;Assess for normal bowel sounds;Monitor for abdominal discomfort;Assess for signs and symptoms of bleeding. Report signs of bleeding to physician;Administer treatments as ordered;Assess for flatus;Reinforce education on foods that improve and complicate bowel movements and how activity and medications can affect bowel movements;Administer medications to improve bowel evacuation as prescribed

## 2016-11-26 ENCOUNTER — Inpatient Hospital Stay: Payer: Medicare Other

## 2016-11-26 DIAGNOSIS — K560 Paralytic ileus: Secondary | ICD-10-CM | POA: Diagnosis not present

## 2016-11-26 LAB — ECG 12-LEAD
P Wave Axis: 103 deg
P-R Interval: 201 ms
Patient Age: 65 years
Q-T Interval(Corrected): 440 ms
Q-T Interval: 402 ms
QRS Axis: 20 deg
QRS Duration: 92 ms
T Axis: 49 years
Ventricular Rate: 72 //min

## 2016-11-26 LAB — VITAMIN B12 AND FOLATE
Folate: 17.5 ng/mL (ref 7.0–19.9)
Vitamin B-12: >2000 pg/mL — ABNORMAL HIGH (ref 213–816)

## 2016-11-26 LAB — RENAL FUNCTION PANEL
Albumin: 2.6 gm/dL — ABNORMAL LOW (ref 3.5–5.0)
Albumin: 2.8 gm/dL — ABNORMAL LOW (ref 3.5–5.0)
Albumin: 2.9 gm/dL — ABNORMAL LOW (ref 3.5–5.0)
Anion Gap: 16 mMol/L (ref 7.0–18.0)
Anion Gap: 15.4 mMol/L (ref 7.0–18.0)
Anion Gap: 11.6 mMol/L (ref 7.0–18.0)
BUN / Creatinine Ratio: 20 Ratio (ref 10.0–30.0)
BUN / Creatinine Ratio: 20.5 Ratio (ref 10.0–30.0)
BUN / Creatinine Ratio: 23.1 Ratio (ref 10.0–30.0)
BUN: 93 mg/dL — ABNORMAL HIGH (ref 7–22)
BUN: 87 mg/dL — ABNORMAL HIGH (ref 7–22)
BUN: 75 mg/dL — ABNORMAL HIGH (ref 7–22)
CO2: 23.8 mMol/L (ref 20.0–30.0)
CO2: 22.8 mMol/L (ref 20.0–30.0)
CO2: 27.2 mMol/L (ref 20.0–30.0)
Calcium: 8 mg/dL — ABNORMAL LOW (ref 8.5–10.5)
Calcium: 7.9 mg/dL — ABNORMAL LOW (ref 8.5–10.5)
Calcium: 8.3 mg/dL — ABNORMAL LOW (ref 8.5–10.5)
Chloride: 102 mMol/L (ref 98–110)
Chloride: 98 mMol/L (ref 98–110)
Chloride: 100 mMol/L (ref 98–110)
Creatinine: 4.64 mg/dL — ABNORMAL HIGH (ref 0.80–1.30)
Creatinine: 4.25 mg/dL — ABNORMAL HIGH (ref 0.80–1.30)
Creatinine: 3.25 mg/dL — ABNORMAL HIGH (ref 0.80–1.30)
EGFR: 12 mL/min/{1.73_m2} — ABNORMAL LOW (ref 60–150)
EGFR: 13 mL/min/{1.73_m2} — ABNORMAL LOW (ref 60–150)
EGFR: 19 mL/min/{1.73_m2} — ABNORMAL LOW (ref 60–150)
Glucose: 144 mg/dL — ABNORMAL HIGH (ref 71–99)
Glucose: 237 mg/dL — ABNORMAL HIGH (ref 71–99)
Glucose: 134 mg/dL — ABNORMAL HIGH (ref 71–99)
Osmolality Calc: 305 mOsm/kg — ABNORMAL HIGH (ref 275–300)
Osmolality Calc: 299 mOsm/kg (ref 275–300)
Osmolality Calc: 294 mOsm/kg (ref 275–300)
Phosphorus: 7.2 mg/dL — ABNORMAL HIGH (ref 2.3–4.7)
Phosphorus: 6.4 mg/dL — ABNORMAL HIGH (ref 2.3–4.7)
Phosphorus: 4.8 mg/dL — ABNORMAL HIGH (ref 2.3–4.7)
Potassium: 4.8 mMol/L (ref 3.5–5.3)
Potassium: 4.2 mMol/L (ref 3.5–5.3)
Potassium: 3.8 mMol/L (ref 3.5–5.3)
Sodium: 137 mMol/L (ref 136–147)
Sodium: 132 mMol/L — ABNORMAL LOW (ref 136–147)
Sodium: 135 mMol/L — ABNORMAL LOW (ref 136–147)

## 2016-11-26 LAB — CBC
Hematocrit: 27.2 % — ABNORMAL LOW (ref 39.0–52.5)
Hemoglobin: 8.9 gm/dL — ABNORMAL LOW (ref 13.0–17.5)
MCH: 30 pg (ref 28–35)
MCHC: 33 gm/dL (ref 32–36)
MCV: 92 fL (ref 80–100)
MPV: 7.4 fL (ref 6.0–10.0)
PLT CT: 183 10*3/uL (ref 130–440)
RBC: 2.95 10*6/uL — ABNORMAL LOW (ref 4.00–5.70)
RDW: 15.6 % — ABNORMAL HIGH (ref 11.0–14.0)
WBC: 13.9 10*3/uL — ABNORMAL HIGH (ref 4.0–11.0)

## 2016-11-26 LAB — CBC AND DIFFERENTIAL
Bands: 2 % (ref 0–10)
Basophils %: 0 % (ref 0.0–3.0)
Basophils Absolute: 0 10*3/uL (ref 0.0–0.3)
Eosinophils %: 0 % (ref 0.0–7.0)
Eosinophils Absolute: 0 10*3/uL (ref 0.0–0.8)
Hematocrit: 23.8 % — ABNORMAL LOW (ref 39.0–52.5)
Hemoglobin: 7.9 gm/dL — ABNORMAL LOW (ref 13.0–17.5)
Lymphocytes Absolute: 0.5 10*3/uL — ABNORMAL LOW (ref 0.6–5.1)
Lymphocytes: 4 % — ABNORMAL LOW (ref 15.0–46.0)
MCH: 31 pg (ref 28–35)
MCHC: 33 gm/dL (ref 32–36)
MCV: 92 fL (ref 80–100)
MPV: 8 fL (ref 6.0–10.0)
Monocytes Absolute: 0.5 10*3/uL (ref 0.1–1.7)
Monocytes: 4 % (ref 3.0–15.0)
Myelocytes: 2 % — ABNORMAL HIGH (ref 0–0)
Neutrophils %: 88 % — ABNORMAL HIGH (ref 42.0–78.0)
Neutrophils Absolute: 11.6 10*3/uL — ABNORMAL HIGH (ref 1.7–8.6)
PLT CT: 163 10*3/uL (ref 130–440)
RBC: 2.58 10*6/uL — ABNORMAL LOW (ref 4.00–5.70)
RDW: 15.5 % — ABNORMAL HIGH (ref 11.0–14.0)
WBC: 12.6 10*3/uL — ABNORMAL HIGH (ref 4.0–11.0)

## 2016-11-26 LAB — T4, FREE: T4 Free: 0.87 ng/dL (ref 0.70–1.48)

## 2016-11-26 LAB — TSH: TSH: 1.81 u[IU]/mL (ref 0.40–4.20)

## 2016-11-26 LAB — VH DEXTROSE STICK GLUCOSE
Glucose POCT: 164 mg/dL — ABNORMAL HIGH (ref 71–99)
Glucose POCT: 175 mg/dL — ABNORMAL HIGH (ref 71–99)
Glucose POCT: 211 mg/dL — ABNORMAL HIGH (ref 71–99)

## 2016-11-26 LAB — AMMONIA: Ammonia: 134.8 ug/dL — ABNORMAL HIGH (ref 31.0–123.0)

## 2016-11-26 LAB — SODIUM, URINE, RANDOM: Urine Sodium Random: 43 mMol/L

## 2016-11-26 LAB — LIPID PANEL
Cholesterol: 70 mg/dL — ABNORMAL LOW (ref 75–199)
Coronary Heart Disease Risk: 3.33
HDL: 21 mg/dL — ABNORMAL LOW (ref 40–55)
LDL Calculated: 33 mg/dL
Triglycerides: 78 mg/dL (ref 10–150)
VLDL: 16 (ref 0–40)

## 2016-11-26 LAB — CREATININE, URINE, RANDOM: Urine Creatinine Random: 36.38 mg/dL

## 2016-11-26 LAB — HEMOGLOBIN A1C: Hgb A1C, %: 6.5 %

## 2016-11-26 MED ORDER — SODIUM CHLORIDE 0.9 % IV BOLUS
500.0000 mL | INTRAVENOUS | Status: DC | PRN
Start: 2016-11-26 — End: 2016-11-29

## 2016-11-26 MED ORDER — BISACODYL 10 MG RE SUPP
10.0000 mg | Freq: Once | RECTAL | Status: AC
Start: 2016-11-26 — End: 2016-11-26
  Administered 2016-11-26: 14:00:00 10 mg via RECTAL
  Filled 2016-11-26: qty 1

## 2016-11-26 NOTE — Plan of Care (Signed)
Problem: Compromised Tissue integrity  Goal: Damaged tissue is healing and protected  Outcome: Progressing      Problem: Ineffective Gas Exchange  Goal: Effective breathing pattern  Outcome: Progressing

## 2016-11-26 NOTE — Progress Notes (Signed)
[place]   Daily Progress Note      [name], [name]  Date/Time: 11/26/16 10:55 AM  Attending Physician: [name], MD  Location/Room: 3548/3548-A     Interval Events:   Serosanguineous drainage via pericardial drain  Right upper extremity strength further improved  Question of bowel obstruction is resolved  Creatinine slightly increased but rate of increase has slowed,  Fluid balance is approximately 800 mL positive      Subjective:   No shortness breath, productive cough or chest pain    Problem List:     Active Hospital Problems    Diagnosis   . Chronic anticoagulation   . Stroke   . CKD (chronic kidney disease)   . Former smoker, stopped smoking many years ago   . Type 2 diabetes mellitus   . HTN (hypertension)   . HLD (hyperlipidemia)   . Diabetic neuropathy   . COPD (chronic obstructive pulmonary disease)   . Bipolar disorder   . BPH (benign prostatic hyperplasia)   . Atrial fibrillation   . Chronic atrial fibrillation   . 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  The procedure itself was uncomplicated.  The patient was  extubated without difficulty.  On arrival to the intensive care unit, a 500 cc bolus of fluid was drained from the mediastinal via the pericardial drain.  It is unclear whether or not the collection of blood was related pooling during the ablation procedure or whether it represented active ongoing bleeding.  There was no evidence of pericardial tamponade via abnormal hemodynamics and  there was no evidence of an enlarging cardiac silhouette. Initially, the patient did well but then began to experience increasing dyspnea and worsening renal function. A repeat echocardiogram revealed a large circumferential pericardial effusion. He developed a new right hemiplegia right upper greater than right lower and was sent for a CT scan of the brain on 11/24/2016 which showed a likely subacute infarct of the superior left frontoparietal lobe gyrus. The patient's right upper extremity strength has been improving. Concerns regarding possible bowel obstruction apparently have resolved.  The patient's serum creatinine is elevated and essentially unchanged to slightly increased but the rate of increased is slowing down suggesting a potential for improved renal function  Plan:     Cardiovascular: Blood pressure has been acceptable, we will treat to keep blood pressure less than 160 if necessary. Ricardo drain with less than 30 mL serosanguineous drainage, no echocardiogram scheduled, hemodynamically stable-observe    Pulmonary: Remote history of tobacco abuse at this suggestion of severe obstructive airways disease based on the optic spirometry studies not available for review)     Gastrointestinal: Surgical evaluation is inconsistent with with an acute bowel obstruction-apparently resolved. Diet has been progressed to a postoperative solids diet-follow-up and observe     Infectious Disease: No active issues     Neurologic: Patient with possible extension of previous CVA involving the superior left frontoparietal lobe gyrus with ?reversibly increased neuro deficits secondary to hypoperfusion. Will control blood pressure to keep BP systolic less than 944     Renal: Fluid balance and renal  function as discussed above,-defer fluid management to renal consultant.     Hem/Onc: Hemoglobin/hematocrit drifting downwards, reflecting recent blood loss with stable platelets. No indication for transfusion-follow-up. Would  continue every 12 are hemograms for now. Continue SCDs/peds for DVT/PE prophylaxis    Endocrine: Serial serum glucoses are mild to moderately elevated but within target range-continue current sliding scale insulin,    ICU Management:  Sedation: None needed.  Nutrition: Postop solid diet  GI Prophylaxis: PPI bid  VTE Prophylaxis: SCDs/Pneumatic Stockings  Foley Catheter: continue    Disposition: Keep in ICU  Code Status: Full Code    Physical Exam:   Vitals: BP 110/61   Pulse 74   Temp 98.1 F (36.7 C) (Oral)   Resp 18   Ht 1.702 m (5\' 7" )   Wt 87.8 kg (193 lb 9 oz)   SpO2 98%   BMI 30.32 kg/m   I/O:     Intake/Output Summary (Last 24 hours) at 11/26/16 1055  Last data filed at 11/26/16 0954   Gross per 24 hour   Intake          3760.42 ml   Output             2602 ml   Net          1158.42 ml     Vent settings:      General Appearance:  Well-developed, well-nourished 66 year old white male who is alert and oriented 3 in no apparent respiratory distress     Mental status: Alert, oriented 3     Neuro:Mild right hemiplegia right upper greater than right lower     HEENT:NCAT     Neck: No cervical adenopathy     Lungs:Breath sounds are normal, few coarse rales at lung bases, no rhonchi, no wheezing     Cardiac: Regular rate and rhythm, S1, S2, no S3, no S4, no murmurs, rubs or gallops     Abdomen: Bowel sounds are normal, abdomen is soft, distended, nontender with no gross hepatosplenomegaly     Extremities:No cyanosis, clubbing or edema     Skin: No acute skin breakdown appreciated     Review of Systems:   Pertinent items are noted in HPI. All other systems are negative.    Labs:   Interval Labs in the [facility] record were reviewed including the following:  CBC:     Recent Labs  Lab 11/26/16  0302 11/25/16  1801 11/25/16  1728 11/25/16  1120   WBC 12.6* 14.1*  --  15.7*   RBC 2.58* 3.03*  --  3.27*   Hemoglobin 7.9* 8.9* 10.5* 9.8*   Hematocrit 23.8* 28.2*  --  30.1*   MCV 92 93  --  92   PLT CT 163 162  --  165      Chemistry:   Recent Labs  Lab 11/26/16  0302 11/25/16  1800 11/25/16  1120 11/25/16  0454 11/24/16  0353  11/23/16  0340   Sodium 137 136 134* 133* 134* More results in Results Review 142   Potassium 4.8 5.5* 6.4* 6.3* 4.9 More results in Results Review 4.2   Chloride 102 102 104 102 103 More results in Results Review 111*   CO2 23.8 20.1 15.6* 19.4* 19.9* More results in Results Review 23.5   BUN 93* 91* 85* 75* 53* More results in Results Review 31*   Creatinine 4.64* 4.60* 4.21* 3.79* 2.61* More results in Results Review 1.62*   Glucose 144* 133* 169* 230*  187* More results in Results Review 120*   Calcium 8.0* 8.8 9.4 8.9 9.0 More results in Results Review 8.7   Magnesium  --   --  2.9*  --   --   --  2.3   More results in Results Review = values in this interval not displayed.  LFTs:     Recent Labs  Lab 11/26/16  0302 11/25/16  0454   ALT  --  78*   AST (SGOT)  --  83*   Bilirubin, Total  --  1.4*   Albumin 2.6* 3.3*   Alkaline Phosphatase  --  66     Cardiac Enzymes:     Coags:     Recent Labs  Lab 11/23/16  0340 11/22/16  1540   PT INR 1.3 1.3   PT 13.2* 13.1*       Radiology / Imaging:     Imaging personally reviewed by me, including CXR: Chest x-ray 11/26/2016 with pleural reaction at left base-unchanged compared to 11/24/2016    Attestation & Billing:   Patient's condition and plan discussed with: patient, family and consultants    This patient has a high probability of sudden clinically significant deterioration which requires the highest level of physician preparedness to intervene urgently. I managed/supervised life or organ supporting interventions that required frequent physician assessments. I devoted my full attention in the ICU to the direct care of this patient for this period of time.    Any critical care time was performed today and is exclusive of teaching, billable procedures, and not overlapping with any other providers.    Billing: Critical care time: 40 minutes.    Signed by: Robyn Haber, MD   [place]:[id number], Delphia Grates, MD

## 2016-11-26 NOTE — Progress Notes (Signed)
[place] DAILY PROGRESS NOTE      [name]  Attending Physician: [name], MD    [name] REVIEWED INFORMATION BELOW, DISCUSSED RELEVANT POINTS WITH PATIENTS AND PERSONALLY DEVELOPED PLAN OF CARE.    Assessment/Plan:   POD # 4 S/p hybrid left atrial ablation using bipolar radiofrequency.   BP 110/61   Pulse 74   Temp 98.1 F (36.7 C) (Oral)   Resp 18   Ht 1.702 m (5\' 7" )   Wt 87.8 kg (193 lb 9 oz)   SpO2 98%   BMI 30.32 kg/m     Cardiac: RRR, HR 74 blood pressures improved. Pericardiocentesis yesterday with drain in place, approx 700 dark bloody fluid removed. 147 output after initial drainage yesterday. Per Dr. [name], leave drain in place today. Will get echo later this afternoon. Continue to hold anticoagulants.     Respiratory: breathing slightly improved this morning, is on 5LNC     GI: XR abdomen: The bowel gas pattern is now unremarkable. The dilated small bowel loops in the midabdomen have resolved.   Pt reports nausea has resolved at this time. No abdominal pain. On clear liquid diet.     Electrolytes: Hyperkalemia resolved, 4.8 see nephrology note Creatinine 4.64 still rising. Urine output minimal. Foley in.     Heme: WBC: 12.6, downtrending (14.1)  H/H 7.9, 23.8 PLT 163     Neuro: Upper and lower extremity weakness improved this morning. [name] spoke with Dr. Randall An, no need for MRI now as would not change clinical practice or acute management. Patient will need outpatient follow up with Neurology, and may get MRI at some point in the future.     Dispo: Remain in ICU today. Continue pericardial drain today. Will check echo this afternoon       Subjective     Pt reports he still is unable to lay flat, but improving. Worked with PT and sat up to side of bed and up into chair.     Vital Signs:   Vitals:  Temp: 98.1 F (36.7 C)  Heart Rate: 74  BP: 110/61  SpO2: 98 %  O2 Flow Rate (L/min): 5 L/min    Pre-Op Weight: 85.3 kg (188 lb) (11/15/16  1033)  Post-Op Weight: 87.8 kg (193 lb 9 oz) (11/26/16 0525)    Recent Labs  Lab 11/26/16  0518 11/25/16  2352 11/25/16  1157 11/25/16  0738 11/25/16  0317   Glucose, POCT 164* 175* 167* 184* 254*         Intake/Output Summary (Last 24 hours) at 11/26/16 1013  Last data filed at 11/26/16 0954   Gross per 24 hour   Intake          3760.42 ml   Output             2602 ml   Net          1158.42 ml       Physical Exam:     General: awake, alert, in mild respiratory distress  Neck: supple  Cardiovascular: regular rate and rhythm, normal S1,S2 no murmurs, rubs or gallops  Lungs:  Decreased breath sounds bilaterally, normal respiratory effort  Abdomen:  normoactive bowel sounds, soft, non-tender, non-distended  Extremities: 2+ lower extremity edema  Neuro: Cranial nerves II-XII are grossly intact. Pt has delayed responses. He is alert and oriented x 3.  Upper extremity strength equal bilaterally, leg strength also equal bilaterally. Speech is normal.  Meds:     Current Facility-Administered Medications   Medication Dose Route Frequency   . acetaminophen  1,000 mg Intravenous Q8H   . albuterol-ipratropium  3 mL Nebulization Q6H [facility]   . amiodarone  200 mg Oral Q12H [place]   . divalproex EC/DR tablet  500 mg Oral Q12H [place]   . docusate sodium  100 mg Oral Daily   . gabapentin  100 mg Oral Q12H [place]   . pantoprazole  40 mg Oral BID AC   . polyethylene glycol  17 g Oral Daily   . pravastatin  40 mg Oral QPM   . senna  8.6 mg Oral QHS   . sodium bicarbonate  650 mg Oral BID     Current Facility-Administered Medications   Medication Dose Route Frequency Last Rate   . sodium chloride   Intravenous Continuous 75 mL/hr at 11/26/16 0006   . niCARdipine  5-15 mg/hr Intravenous Continuous Stopped (11/25/16 2119)   . IV fluids with sodium bicarbonate  100 mL/hr Intravenous Continuous Stopped (11/26/16 0005)     Current Facility-Administered Medications   Medication Dose Route   . albuterol  2.5 mg Nebulization   . dextrose  125 mL  Intravenous   . dextrose  125 mL Intravenous   . glucagon (rDNA)  1 mg Intramuscular   . glucagon (rDNA)  1 mg Intramuscular   . insulin aspart  2-24 Units Subcutaneous   . morphine  1 mg Intravenous   . ondansetron  4 mg Oral    Or   . ondansetron  4 mg Intravenous   . promethazine  25 mg Oral    Or   . promethazine  12.5 mg Rectal    Or   . promethazine  6.25 mg Intramuscular   . traMADol  50 mg Oral             Labs:       Recent Labs  Lab 11/26/16  0302 11/25/16  1800 11/25/16  1120 11/25/16  0454 11/24/16  0353  11/23/16  0340   Sodium 137 136 134* 133* 134* More results in Results Review 142   Potassium 4.8 5.5* 6.4* 6.3* 4.9 More results in Results Review 4.2   Chloride 102 102 104 102 103 More results in Results Review 111*   CO2 23.8 20.1 15.6* 19.4* 19.9* More results in Results Review 23.5   BUN 93* 91* 85* 75* 53* More results in Results Review 31*   Creatinine 4.64* 4.60* 4.21* 3.79* 2.61* More results in Results Review 1.62*   EGFR 12* 12* 14* 15* 24* More results in Results Review 43*   Calcium 8.0* 8.8 9.4 8.9 9.0 More results in Results Review 8.7   Magnesium  --   --  2.9*  --   --   --  2.3   More results in Results Review = values in this interval not displayed.    Recent Labs  Lab 11/26/16  0302 11/25/16  1801 11/25/16  1728 11/25/16  1120   WBC 12.6* 14.1*  --  15.7*   RBC 2.58* 3.03*  --  3.27*   Hemoglobin 7.9* 8.9* 10.5* 9.8*   Hematocrit 23.8* 28.2*  --  30.1*   MCV 92 93  --  92   PLT CT 163 162  --  165       Recent Labs  Lab 11/23/16  0340 11/22/16  1540   PT 13.2* 13.1*   PT INR 1.3 1.3  Recent Labs  Lab 11/26/16  0302   Cholesterol 70*   Triglycerides 78   HDL 21*   LDL Calculated 33       Recent Labs  Lab 11/26/16  0302 11/25/16  0454   Bilirubin, Total  --  1.4*   Protein, Total  --  6.6   Albumin 2.6* 3.3*   ALT  --  78*   AST (SGOT)  --  83*       Radiology:     Radiology Results (24 Hour)     Procedure Component Value Units Date/Time    XR Chest AP Portable [[id number]]  Collected:  11/26/16 0423    Order Status:  Completed Updated:  11/26/16 0425    Narrative:       HISTORY:Reason For Exam: Evaluate volume overload, cardiac silhouette (large pericardial effusion)  The patient has a history of large pericardial effusion.    COMPARISON: November 24, 2016    TECHNIQUE: XR CHEST AP PORTABLE     FINDINGS:    Lines and tubes: None    Lungs and hila: Left basilar consolidation or atelectasis is unchanged. Mild pulmonary vascular congestion is present and stable. There is a probable small left pleural effusion which is unchanged. There is no pneumothorax.    Heart and Mediastinum: The cardiac silhouette is enlarged and stable in size.     Bones and soft tissues: No acute abnormality.    Upper abdomen: Normal      Impression:       Stable pulmonary vascular congestion and left basilar atelectasis or infiltrate.  Probable small left pleural effusion is unchanged.            ReadingStation:[id number]          Rennis Golden, PA  Date: November 26, 2016  Time: 10:13 AM    Please feel free to contact me or [name] for any further questions or concerns.    [facility]  [phone]

## 2016-11-26 NOTE — Progress Notes (Signed)
DAILY PROGRESS NOTE - ACCESS   Name:  [name], [name]     DOB:  1950-10-09   MR#:  [id number]               ROOM: 3548/3548-A    DATE:  11/26/16      Principal Diagnosis:  <principal problem not specified>    Refer to below for diagnoses being addressed for this encounter    CONDITION:  gradually improving     ASSESSMENT & PLAN:                                                              Hospital Day: 5    s/p paralytic ileus    Assessment:  Resolved    Plan:  Advance diet to postop solids.   Bowel regimen.    Charlestine Night, MD     [facility]    [phone]  or  [phone]     Subjective/Chief Complaint:   Had pericardiocentesis yesterday.  Recovering in ICU.  Feels better.  No significant abdominal pain.  Passing lots of flatus.  No BM.    ROS:    Admits to tolerating PT and tolerating diet  Denies fever/chills, SOB/DOE, dysuria, diarrhea, nausea and vomiting    Meds:    acetaminophen 1,000 mg Intravenous Q8H   albuterol-ipratropium 3 mL Nebulization Q6H [place]   amiodarone 200 mg Oral Q12H [place]   divalproex EC/DR tablet 500 mg Oral Q12H [place]   docusate sodium 100 mg Oral Daily   gabapentin 100 mg Oral Q12H SCH   pantoprazole 40 mg Oral BID AC   polyethylene glycol 17 g Oral Daily   pravastatin 40 mg Oral QPM   senna 8.6 mg Oral QHS   sodium bicarbonate 650 mg Oral BID           EXAM:   Vitals:  Blood pressure 110/61, pulse 74, temperature 98.1 F (36.7 C), temperature source Oral, resp. rate 18, height 1.702 m (5\' 7" ), weight 87.8 kg (193 lb 9 oz), SpO2 98 %.   General:   well-nourished, in no apparent distress, non-toxic  Pulmonary:   mild rhonchi in bilateral lung fields, equal  Cardiac:   regular rate and rhythm without murmurs/rubs/gallops  Abdomen:   active bowel sounds, soft, mild epigastric discomfort (greatly improved) without rebound and without guarding, non-distended  Neuro:   alert, weakness of RUE noted.  Psych:   insight fair  Extremities:   1+ edema BLE and peripheral pulses 2+ and symmetric  HEENT:    normocephalic, pupils equal @ 3 mm, pupils reactive, EOMI, sclera clear and anicteric, oropharynx clear, oral mucosa is pink and moist  Neck:   supple, Normal range of motion and Trachea midline  Skin:   no jaundice, no rashes    Lab Data Reviewed:  Yes -     Lab results have been reviewed as follows:  CHEM:     Recent Labs  Lab 11/26/16  0302 11/25/16  1800 11/25/16  1120 11/25/16  0454 11/24/16  0353  11/23/16  0340   Glucose 144* 133* 169* 230* 187* More results in Results Review 120*   Sodium 137 136 134* 133* 134* More results in Results Review 142   Potassium 4.8 5.5* 6.4* 6.3* 4.9 More results in  Results Review 4.2   Chloride 102 102 104 102 103 More results in Results Review 111*   CO2 23.8 20.1 15.6* 19.4* 19.9* More results in Results Review 23.5   BUN 93* 91* 85* 75* 53* More results in Results Review 31*   Creatinine 4.64* 4.60* 4.21* 3.79* 2.61* More results in Results Review 1.62*   Calcium 8.0* 8.8 9.4 8.9 9.0 More results in Results Review 8.7   Magnesium  --   --  2.9*  --   --   --  2.3   Phosphorus 7.2*  --   --   --   --   --   --    More results in Results Review = values in this interval not displayed.  CBC:       Recent Labs  Lab 11/26/16  0302 11/25/16  1801 11/25/16  1728 11/25/16  1120 11/25/16  0454 11/24/16  0353   WBC 12.6* 14.1*  --  15.7* 14.3* 17.0*   Hemoglobin 7.9* 8.9* 10.5* 9.8* 9.6* 10.1*   Hematocrit 23.8* 28.2*  --  30.1* 29.2* 31.3*   PLT CT 163 162  --  165 170 191   MCV 92 93  --  92 93 94     BANDS:    Recent Labs  Lab 11/26/16  0302 11/25/16  1120 11/25/16  0454   Bands 2 1 3      POCT:    Recent Labs  Lab 11/26/16  0518 11/25/16  2352 11/25/16  1157 11/25/16  0738 11/25/16  0317   Glucose, POCT 164* 175* 167* 184* 254*     LFTs:      Recent Labs  Lab 11/26/16  0302 11/25/16  0454   ALT  --  78*   AST (SGOT)  --  83*   Alkaline Phosphatase  --  66   Bilirubin, Total  --  1.4*   Albumin 2.6* 3.3*     COAG:    Recent Labs  Lab 11/23/16  0340 11/22/16  1540   PT INR 1.3 1.3    PT 13.2* 13.1*   aPTT  --  33.5     Lactate:            Radiology:   Xr Chest Ap Portable    Result Date: 11/26/2016  Stable pulmonary vascular congestion and left basilar atelectasis or infiltrate. Probable small left pleural effusion is unchanged. ReadingStation:[id number]

## 2016-11-26 NOTE — Plan of Care (Signed)
Problem: Hemodynamic Status: Cardiac  Goal: Stable vital signs and fluid balance  Interventions:  1. Monitor/assess vital signs and telemetry per unit protocol  2. Weigh on admission and record weight daily  3. Assess signs and symptoms associated with cardiac rhythm changes  4. Monitor intake/output per unit protocol and/or LIP order  5. Monitor lab values  6. Monitor for leg swelling/edema and report to LIP if abnormal   Outcome: Progressing

## 2016-11-26 NOTE — Progress Notes (Signed)
[place] [id number]     PROGRESS NOTE    Case Management       Inpatient Plan of Care:      POD # 4 hybrid left atrial ablation w/resection of portion of xyphoid,  AKI,    Pericardiocentesis 4/19 w/700cc removed. Additional 147 after drain placed.    ECHO today   Nephro following-Creatinine 4.64   CVA-w/right sided weakness followed by Neuro   Serial monitoring/exams   Supportive Care   CM Interventions:      Chart reviewed, spoke w/pt who was sitting up in chair.  Able to raise R arm (dominant) however eating w/left.  Encouraged to exercise R side, stress ball obtained for exercising hand/arm    Discussed need for either acute rehab or HH based on recommendations by PT/OT.  States he would think about it.    BARRIERS:   CVA w/right sided weakness    NEEDS   Acute rehab vs HH    PLAN:   TBD pending clinical course.     RNCM to follow/assist w/disch needs         Kieth Brightly A. Helene Kelp, RN MSN  Case Management  Ph: [phone]  Fax: [phone]

## 2016-11-26 NOTE — Progress Notes (Signed)
Renal Progress Note    Follow up for:  ARF  CKD, stage 3    Subjective:     Feeling better today  Sitting up eating lunch    ROS negative for: SOB, CP, F/C, N/V/D, pruritis.    Objective:     Vitals: BP 104/58   Pulse 68   Temp 98.2 F (36.8 C) (Oral)   Resp 17   Ht 1.702 m (5\' 7" )   Wt 87.8 kg (193 lb 9 oz)   SpO2 99%   BMI 30.32 kg/m       I/O:   Intake/Output Summary (Last 24 hours) at 11/26/16 1550  Last data filed at 11/26/16 1450   Gross per 24 hour   Intake          5027.92 ml   Output             3452 ml   Net          1575.92 ml       Weights:  Wt Readings from Last 1 Encounters:   11/26/16 0525 87.8 kg (193 lb 9 oz)   11/25/16 0540 86.3 kg (190 lb 4.8 oz)   11/24/16 0549 86.7 kg (191 lb 2.2 oz)   11/22/16 0608 85.6 kg (188 lb 11.4 oz)   11/15/16 1033 85.3 kg (188 lb)       Physical Exam:  General: No distress.  Lungs: CTA  Heart: RRR, no murmur, gallop, rub, or heave.  Abdomen: BS present, soft, NT/ND, no organomegaly.  Extremities: trace edema.  Access: N/A        Labs:  Labs reviewed.  Radiology results reviewed.  Pertinent findings below.  Recent Labs      11/26/16   0302  11/25/16   1801   WBC  12.6*  14.1*   Hemoglobin  7.9*  8.9*   Hematocrit  23.8*  28.2*   PLT CT  163  162     No results for input(s): PT, INR, APTT in the last 72 hours.  No results for input(s): TROPI, CK, CKMBINDEX in the last 72 hours.  No results for input(s): BNP in the last 72 hours.        Xr Chest Ap Portable    Result Date: 11/26/2016  Stable pulmonary vascular congestion and left basilar atelectasis or infiltrate. Probable small left pleural effusion is unchanged. ReadingStation:[id number]     Recent Labs      11/26/16   1157  11/26/16   0302  11/25/16   1800   Glucose  237*  144*  133*   Sodium  132*  137  136   Potassium  4.2  4.8  5.5*   Chloride  98  102  102   CO2  22.8  23.8  20.1   BUN  87*  93*  91*   Creatinine  4.25*  4.64*  4.60*   EGFR  13*  12*  12*   Calcium  7.9*  8.0*  8.8     Recent Labs       11/26/16   1157  11/26/16   0302  11/25/16   1120   Magnesium   --    --   2.9*   Phosphorus  6.4*  7.2*   --      Recent Labs      11/26/16   1157  11/26/16   0302  11/25/16   0454   Albumin  2.8*  2.6*  3.3*   Protein, Total   --    --   6.6   Bilirubin, Total   --    --   1.4*   Alkaline Phosphatase   --    --   66   ALT   --    --   78*   AST (SGOT)   --    --   83*    No results for input(s): SGUR, LABPH, PROTEINUR, GLUUA, KETONESUA, BILIUA, BLOODUA, NITRITEUA, UROBILIUA, LEUA, NITRITEUA, WBCUA, RBCUA, BACTERIAUA in the last 72 hours.    Invalid input(s):  AMORPHOUSUA               Assessment:     1. AKI - pre-renal ischemia +/- ATN resulting from pericardial effusion; peak creatinine 4.64 this morning, now improving and no longer oliguric  2. Stage III chronic kidney disease-with apparent baseline creatinine 1.6-2.1 mg/dL; most recent creatinine value on outpatient basis had been 2.0 mg/dL on 11/15/16. Suspect underlying diabetic/hypertensive kidney disease.  3. Hyperkalemia-associated with reduced renal function and ACE inhibitor use; also component of hyperglycemia  4. Metabolic acidosis-associated with reduced renal function  5. Ileus  6. Status post hybrid left atrial ablation (convergent procedure) for atrial fibrillation  7. Hypertension  8. Subacute CVA of left frontoparietal lobes  9. Type 2 diabetes mellitus      Plan:      Non-oliguric with apparent plateau and improvement in azotemia/sCr. Anticipate further improvement   Maintenance IVFs as ordered for now with additional Prn boluses to maintain goal SBP of 120-160   Continue with q12 hour chemistries for now.      Orders and Medications reviewed in Epic.  Case discussed with RN.    Blanchard Mane, Pager [phone]  3:50 PM  11/26/2016  [facility]  Dr. Hurley Cisco, Dr. Josephina Shih, Dr. Fernande Boyden  Office: [phone]

## 2016-11-26 NOTE — PT Eval Note (Signed)
______________________________________________________________________    I have reviewed and agree with the documentation authored by my student of the evaluation/ treatment/education/care plan delivered during my direct supervision.     Co-Signed by:  Jillyn Hidden, PT  ______________________________________________________________________      [facility]: [facility]  Department of Rehabilitation Services: [phone]  [name]    CSN: [id number]    CRITICAL CARE 4   [id number]/3548-A    Physical Therapy Evaluation    Time of treatment:  Time Calculation  PT Received On: 11/26/16  Start Time: 0957  Stop Time: 1022  Time Calculation (min): 25 min    Visit#: 1                                                                                 Precautions and Contraindications:   Falls  Mobility protocol   Pericardial drain in place on left upper chest  SBP between 120-160    Clinical Presentation and Decision Making     PT Assessment:  [name] was admitted 11/22/2016 following a hybrid convergent atrial ablation procedure on 11/22/16. On 11/25/16, the patient was transferred to the ICU with shortness of breath. The patient had a large pericardial effusion drained on 11/25/16. The patient is also presenting with right sided weakness, particularly on the right upper extremity. 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 Today, the patient completed bed mobility and transfers with minimal assistance. The patient was unable to ambulate today secondary to decreased SpO2 to 88% with sitting edge of bed. At his baseline, the patient was independent with all mobility tasks and ADL's.    Patient presenting with the following PT Impairments:decreased strength, decreased activity tolerance, impaired motor control , decreased functional mobility, decreased balance, gait deficits, pain    Patient will benefit from skilled PT services in order to  address the above limitations and return to a higher level of funciton.     Due to the presence of a limited number of treatment options and 1-2 comorbidities or personal factors that affect performance, as well as patient's evolving clinical presentation with changing characteristics, minimal to moderate modifications of mobility and/or assistance were necessary to complete evaluation when examining total of 4 or more elements (includes body structures and functions, activity limitations and/or participation restrictions) determines the degree of complexity for this patient is MODERATE    Rehabilitation Potential:Good    Discussed risk, benefits and Plan of Care with: Patient    DISCHARGE RECOMMENDATIONS   DME recommended for Discharge:   TBD at next level of care    Discharge Recommendations:   SNF   Pending medical progress  Pending progress in next therapy session    Development of Plan of Care:     Goals:    To be completed by discharge:  1. The patient will complete all bed mobility with supervision in order to prepare to transfer. NEW  2. The patient will complete sit to stand and stand to sit transfers with supervision in order to prepare to ambulate. NEW  3. The patient will ambulate 150 feet with supervision with LRAD in order  to demonstrate independence with mobility. NEW  4. The patient will ascend/descend 7 stairs with 2 rails with supervision in order to demonstrate the ability to get into his home. NEW    Treatment/interventions: Exercise, Gait training, Stair training, Neuromuscular re-education, Functional transfer training, LE strengthening/ROM, Patient/caregiver training, Bed mobility    Treatment Frequency: 4-5x/wk    History:   History of Present Illness:    Medical Diagnosis: Atrial fibrillation, unspecified type [I48.91]    [name] is a 66 y.o. male admitted on 11/22/2016 following a hybrid convergent atrial ablation procedure on 11/22/16. On 11/25/16, the patient was transferred  to the ICU with shortness of breath. The patient had a large pericardial effusion drained on 11/25/16. The patient is also presenting with right sided weakness, particularly on the right upper extremity. 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     Patient Active Problem List   Diagnosis   . Persistent atrial fibrillation   . Fatty liver   . Atrial fibrillation, persistent   . Atrial fibrillation   . Chronic anticoagulation   . Stroke   . CKD (chronic kidney disease)   . Former smoker, stopped smoking many years ago   . Type 2 diabetes mellitus   . HTN (hypertension)   . HLD (hyperlipidemia)   . Diabetic neuropathy   . COPD (chronic obstructive pulmonary disease)   . Bipolar disorder   . BPH (benign prostatic hyperplasia)   . Chronic atrial fibrillation   . Stage 4 chronic kidney disease        X-Rays/Tests/Labs:  Xr Abdomen Ap    Result Date: 11/24/2016  Findings worrisome for partial proximal or mid small bowel obstruction. ReadingStation:[email or web address]    Ct Head Wo Contrast    Result Date: 11/24/2016  1. No evidence of intracranial hemorrhage. 2. Probable subacute infarct superior LEFT frontoparietal lobe gyrus. 3. If clinically indicated, MRI may be helpful in further evaluation. ReadingStation:[email or web address]    Xr Chest Ap Only    Result Date: 11/22/2016  No pneumothorax or significant acute pulmonary abnormalities. Small amount of subcutaneous emphysema in the anterior chest. ReadingStation:[email or web address]    Xr Chest Ap Portable    Result Date: 11/26/2016  Stable pulmonary vascular congestion and left basilar atelectasis or infiltrate. Probable small left pleural effusion is unchanged. ReadingStation:[id number]    Xr Chest Ap Portable    Result Date: 11/24/2016  Cardiomegaly with bilateral airspace disease or edema with small left effusion. No observed pneumothorax. Interval removal right IJ central line. ReadingStation:[email or web address]    X-ray Chest Ap Portable    Result Date:  11/23/2016  1. Pulmonary vascular congestion without overt failure 2. Left basilar opacity likely combination of atelectasis, consolidation and small effusion. 3. Catheter appliances as above ReadingStation:[email or web address]    Xr Abdomen Portable    Result Date: 11/25/2016  No acute abnormality. ReadingStation:[email or web address]        Past Medical/Surgical History:  Past Medical History:   Diagnosis Date   . Arrhythmia    . Arthritis    . Atrial fibrillation    . Atrial flutter    . Bipolar affective    . BPH (benign prostatic hyperplasia)    . Complication of anesthesia     resp. asessment   . Diabetes mellitus    . Diabetic neuropathy    . Gout    . Heart murmur    . HOH (hard of hearing)    . Hyperlipidemia    .  Hypertension    . Paroxysmal atrial fibrillation    . Pulmonary hypertension    . Renal insufficiency    . Type 2 diabetes mellitus, controlled    . Wears glasses       Past Surgical History:   Procedure Laterality Date   . APPENDECTOMY     . CARDIAC ABLATION     . CARDIAC CATHETERIZATION     . CARDIOVERSION      x 2   . HERNIA REPAIR     . TEE     . TONSILLECTOMY     . TONSILLECTOMY, ADENOIDECTOMY           Social History:    Home Living Arrangements:  Living Arrangements: Alone  Assistance Available: None  Type of Home: House  Home Layout: Multi-level, with 7 stair(s) to enter, 2 rail(s), Able to live on main level with bedroom and bathroom    Prior Level of Function:  Mobility:  Independent with  No assistive device  Fall history: none reported  The patient reports that he was independent with all mobility tasks and ADL's prior to admission.    DME available at home:  None    Subjective   "I was able to do everything" the patient responded when asked what his prior level of function was.   Patient is agreeable to participation in the therapy session.    Patient/caregiver goal for PT: to get stronger and return home.    Pain:  At Rest: 1/10  With Activity: 1/10  Location: Incision  Interventions: None  required    Examination of Body Systems (Structures, Function, Activity and Participation)   Patient's medical condition is appropriate for Physical therapy intervention at this time    Observation of patient:  Patient is in bed with dressings, Bed/chair alarm on, telemetry, continuous pulse oximeter, SCD's, drain, catheter, 5L O2     Cognition:  Oriented to: Oriented x4  Command following: Follows ALL commands and directions without difficulty  Insights: Fully aware of deficits    Vital Signs (Cardiovascular):  BP Supine:  109/66 mmHg  HR Supine: 71 bpm  SpO2 at rest: 98%  SpO2 with activity: 88%  on 5L supplemental O2    Sensation: intact      Balance:  Static Sitting:  Good  Dynamic Sitting:  Good  Static Standing:  Fair patient requried minimal assist to maintain static standing balance  Dynamic Standing:  Fair patient required minimal assist to transfer to bedside chair            Musculoskeletal Examination:            Range of motion:  Right LE: Grossly WFL  Left LE: Grossly WFL       Strength:  Right LE: Grossly 5/5 throughout  Left LE: Grossly 5/5 throughout    Tone:  Normal    Functional Mobility:    Bed Mobility:  Supine to Sit:   Minimal assist (assist x2 for safety).   Cues for Sequencing., Cues for Hand placement.  Seated Scooting:   Supervision    Transfers:  Sit to Stand:  Minimal assist with Hand held assist.    Cues for Sequencing, Cues for Hand Placement  Stand to Sit:  Minimal assist.    Cues for Hand Placement  Stand Pivot:   Minimal assist with Hand held assist.   Cues for Sequencing, Cues for Hand Placement    Locomotion:  Not tested due to drop in SpO2 from  98% to 88% with sitting on EOB    AM-PACT "6 Clicks" Basic Mobility Inpatient Short Form  Turning Over in Bed: A little  Sitting Down On/Standing From Armchair: A little  Lying on Back to Sitting on Side of Bed: A little  Assist Moving to/from Bed to Chair: A little  Assist to Walk in Hospital Room: A little  Assist to Climb 3-5 Steps with  Railing: A lot  PT Basic Mobility Raw Score: 17  CMS 0-100% Score: 50.57% Mobility G Code Set  Mobility, Current Status (H6067): At least 40 percent but less than 60 percent impaired, limited or restricted  Mobility, Goal Status (P0340): At least 20 percent but less than 40 percent impaired, limited or restricted  Tools used to determine level of impairment: AM-PACT "6 Clicks" Basic Mobility Score               Participation and Activity Tolerance:  Participation effort: Good  Activity Tolerance: Tolerates 10-20 minutes of activity without rest breaks    Treatment Interventions this session:   Evaluation    Education Provided:   TOPICS: role of physical therapy, plan of care, goals of therapy and benefits of activity, activity with nursing     Learner educated: Patient  Method: Explanation  Response to education: Verbalized understanding    Patient Position at End of Treatment:   Sitting, in a chair, Needs in reach, Bed/chair alarm set and No distress    Team Communication:     Spoke to: RN/LPN Sherron Flemings  Regarding: Pre-session re: patient status, Patient position at end of session, Patient participation with Therapy  Whiteboard updated: No  PT/PTA communication: via written note and verbal communication as needed.      Recommend patient transfer to bedside chair with minimal assistance, sit in bedside chair for all meals outside of PT sessions.    Dwan Bolt, SPT

## 2016-11-26 NOTE — OT Eval Note (Signed)
[facility]: [facility]  Department of Rehabilitation Services: [phone]    Thaison Kolodziejski    CSN#: [id number]  CRITICAL CARE 4 [id number]    Occupational Therapy Evaluation    Consult received for [name] for OT Evaluation and Treatment.  Patient's medical condition is appropriate for Occupational therapy intervention at this time.    Time of treatment:   Time Calculation  OT Received On: 11/26/16  Start Time: 0956  Stop Time: 1024  Time Calculation (min): 28 min    Visit#: 1    Precautions and Contraindications:   Falls  Mobility protocol   Pericardial drain in place during evaluation on left upper chest.     OT Assessment/Clinical Decision Making:      Jerimyah Vandunk is a 66 y.o. male admitted 11/22/2016 presenting s/p hybrid convergent atrial ablation procedure on 11/22/2016. On 11/25/2016, patient transferred to ICU with increasing dyspnea and worsening renal function due to large pericardial effusion noted on echo. He developed a new right hemiplegia right upper greater than right lower and was sent for a CT scan of the brain on 11/24/2016 which showed a likely subacute infarct of the superior left frontoparietal lobe gyrus.     At time of evaluation, strength and sensation of bilateral UEs are symmetrical. Patient does not report deficits on right side. However, with coordination and visuo-motor integration testing, patient appears to have bradykinesia with right upper extremity movement. Patient's vision and oculomotor intact. Patient transferred to cardiac chair during evaluation; however, further mobility deferred at this time due to desaturation at EOB (88% on supplemental O2) and having pericardial drain in place.     At this time, patient displaying the following impairments: balance deficits, decreased activity tolerance, decrease safety awareness, fall risk and pain interfered with activity participation. These impairments are affecting the patient's  ability to perform in needed/desired occupations resulting in performance deficits in bathing/showering, toileting & toilet hygiene, dressing, functional mobility/transfers, personal hygiene & grooming and driving and community mobility. Would benefit from continued occupational therapy services for above noted impairments.     Rehabilitation Potential: Good With continued OT s/p acute discharge With family Ongoing OT assessment needed     Risks/benefits/POC discussed: Patient    Plan:   Treatment/interventions: ADL retraining, functional transfer training, activity pacing, cognition (safety awareness, problem solving, etc.), patient/family training, equipment eval/education, compensatory technique education    Treatment Frequency: OT Frequency Recommended: 4-5x/wk    Goals:   In 2 to 3 visits:  1. Patient will donn/doff socks with Moderate assist and use of AE/AT prn. NEW  2. Patient will thread pants/undergarments over knees with Moderate assist and use of AE/AT/AD prn. NEW  3. Patient will stand and arrange pants/undergarments over hips with Moderate assist and use of AE/AT/AD prn.  NEW  4. Patient will donn/doff shirt/gown with Minimal assist and use of AT/AE prn. NEW  5. Patient will perform BSC/chair transfer with Supervision and use of AD/DME prn. NEW  6. Patient will perform toilet transfer with Minimal assist and use of AD/DME prn. NEW  9. Patient will perform 2 grooming tasks with Minimal assist while standing and use of AE/AT prn. NEW    LTG (by d/c):   1. Patient will be Supervision Minimal assist for ADLs, Supervision for functional transfers/mobility with use of AD/DME/AT/AE. NEW       DISCHARGE RECOMMENDATIONS   DME recommended for Discharge:   TBD closer to d/c  TBD at next level of care  Discharge Recommendations:   SNF;Home with home health OT;Home with supervision   Pending medical progress  Pending progress in next therapy session    Medical & Therapy History:   Medical Diagnosis: Atrial  fibrillation, unspecified type [I48.91]    Mckoy Bhakta is a 66 y.o. male admitted on 11/22/2016 with s/p hybrid convergent atrial ablation procedure on 11/22/2016. On 11/25/2016, patient transferred to ICU with increasing dyspnea and worsening renal function due to large pericardial effusion noted on echo. He developed a new right hemiplegia right upper greater than right lower and was sent for a CT scan of the brain on 11/24/2016 which showed a likely subacute infarct of the superior left frontoparietal lobe gyrus.     X-Rays/Tests/Labs:  Xr Abdomen Ap    Result Date: 11/24/2016  Findings worrisome for partial proximal or mid small bowel obstruction. ReadingStation:[email or web address]    Ct Head Wo Contrast    Result Date: 11/24/2016  1. No evidence of intracranial hemorrhage. 2. Probable subacute infarct superior LEFT frontoparietal lobe gyrus. 3. If clinically indicated, MRI may be helpful in further evaluation. ReadingStation:[email or web address]    Xr Chest Ap Only    Result Date: 11/22/2016  No pneumothorax or significant acute pulmonary abnormalities. Small amount of subcutaneous emphysema in the anterior chest. ReadingStation:[email or web address]    Xr Chest Ap Portable    Result Date: 11/26/2016  Stable pulmonary vascular congestion and left basilar atelectasis or infiltrate. Probable small left pleural effusion is unchanged. ReadingStation:[id number]    Xr Chest Ap Portable    Result Date: 11/24/2016  Cardiomegaly with bilateral airspace disease or edema with small left effusion. No observed pneumothorax. Interval removal right IJ central line. ReadingStation:[email or web address]    X-ray Chest Ap Portable    Result Date: 11/23/2016  1. Pulmonary vascular congestion without overt failure 2. Left basilar opacity likely combination of atelectasis, consolidation and small effusion. 3. Catheter appliances as above ReadingStation:[email or web address]    Xr Abdomen Portable    Result Date: 11/25/2016  No acute abnormality. ReadingStation:[email or web address]    Discussed with  patient/family/caregiver the patient's physical, cognitive and/or psychosocial history related to current functional performance: yes    Previous therapy services: No    Patient Active Problem List   Diagnosis   . Persistent atrial fibrillation   . Fatty liver   . Atrial fibrillation, persistent   . Atrial fibrillation   . Chronic anticoagulation   . Stroke   . CKD (chronic kidney disease)   . Former smoker, stopped smoking many years ago   . Type 2 diabetes mellitus   . HTN (hypertension)   . HLD (hyperlipidemia)   . Diabetic neuropathy   . COPD (chronic obstructive pulmonary disease)   . Bipolar disorder   . BPH (benign prostatic hyperplasia)   . Chronic atrial fibrillation   . Stage 4 chronic kidney disease        Past Medical/Surgical History:  Past Medical History:   Diagnosis Date   . Arrhythmia    . Arthritis    . Atrial fibrillation    . Atrial flutter    . Bipolar affective    . BPH (benign prostatic hyperplasia)    . Complication of anesthesia     resp. asessment   . Diabetes mellitus    . Diabetic neuropathy    . Gout    . Heart murmur    . HOH (hard of hearing)    . Hyperlipidemia    . Hypertension    . Paroxysmal atrial fibrillation    .  Pulmonary hypertension    . Renal insufficiency    . Type 2 diabetes mellitus, controlled    . Wears glasses       Past Surgical History:   Procedure Laterality Date   . APPENDECTOMY     . CARDIAC ABLATION     . CARDIAC CATHETERIZATION     . CARDIOVERSION      x 2   . HERNIA REPAIR     . TEE     . TONSILLECTOMY     . TONSILLECTOMY, ADENOIDECTOMY           Occupational Profile:   Home Living Arrangements  Living Arrangements: Alone  Assistance Available: Assistance available per CM note on 4/18  Type of Home: House  Home Layout: Multi-level, with 7 stair(s) to enter, 2 rail(s), Able to live on main level with bedroom and bathroom  Bathroom:  standard toilet;  tub/shower unit, grab bars in shower  DME Currently at Home: Wheelchair - manual  Single point cane  [employer] wheeled walker    Prior Level of Function  Mobility: Community ambulation  Fall History: none reported     Activities of Daily Living  Patient was independent with all ADLs    Instrumental Activities of Daily Living  Driving & community mobility: Driving: Independent  Patient was independent with all IADLs.    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 Nursing clears patient for therapy.    Pain:  At Rest: 1 /10  With Activity: 1/10  Location: Chest    Interventions: Repositioned, Rest    ASSESSMENT OF OCCUPATIONAL PERFORMANCE:   Observation of Patient:    Patient is in bed with dressings, Bed/chair alarm on, telemetry, continuous pulse oximeter, SCD's, peripheral IV, O2 via nasal cannula, indwelling urinary catheter, and pericardial drain in place            Vital Signs:   BP Supine:  112/71 mmHg  BP after activity: 109/66 mmHg  HR Supine: 69 bpm  HR after activity: 71 bpm  SpO2 at rest: 99%  SpO2 with activity: 88-90% while seated at EOB; patient took rest break, transferred to chair and recovered to 97%  on supplemental O2     Oriented to: Oriented x4  Command following: Follows ALL commands and directions without difficulty  Alertness/Arousal: Appropriate responses to stimuli   Attention Span:Attends to task with redirection  Memory: Appears intact  Safety Awareness: minimal verbal instruction  Insights: Fully aware of deficits  Problem Solving: supervision  Behavior: Cooperative  Motor planning: Bradykinesia, with right UE  Coordination: Within Normal Limits (WNL)    Musculoskeletal Examination:   Range of motion:  Right UE: Grossly WFL  Left UE: Grossly WFL       Strength:  Right UE: Grossly WFL  Left UE: Grossly WFL         Sensory/Oculomotor Examination:   Auditory:  WFL=intact  Tactile-Light Touch:  WFL=intact  Visual Acuity:  wears glasses    Head position:  WDL=Within defined  limits  Tracking:  Able to track stimulus in all quads without difficulty  Saccades:  WFL = Within functional limits    Activities of Daily Living:   Eating:  Independent; , seated in a chair; , simulated  Grooming: Supervision/Set up, Simulated, seated in a chair  UB dressing:  Maximal assist, Simulated,  seated at edge of bed  LB dressing: Total assist: unable to perform task due to current impairments  Bathing:  Total assist: unable to perform task due to current impairments;  Toileting:  Not addressed due to presence of catheter or flexiseal;         Functional Mobility:   Bed Mobility:  Supine to Sit:   Minimal assist (assist x2 for safety).   HOB elevated, assist at trunk, assist at LE(s)  Seated Scooting:   Supervision    Transfers:  Sit to Stand:  Minimal assist (assist x2 for safety) with Hand held assist.        Stand to Sit:  Minimal assist (assist x2 for safety).        Chair: Minimal assist (assist x2 for safety) with Hand held assist.   Cues for Sequencing, Cues for Hand Placement  Functional mobility/ambulation: Minimal assist (assist x2 for safety) with Hand held assist, taking steps to the chair.      Balance:   Static Sitting:  Good  Dynamic Sitting:  Good  Static Standing:  Fair  Dynamic Standing:  Fair    Participation and Activity Tolerance   Participation effort: Good  Activity Tolerance: Tolerates 10-20 minutes of activity with multiple rests    Other Treatment Interventions:   Treatment Activities:   Not applicable          Education Provided:   Topics: Role of occupational therapy, plan of care, goals of therapy and safety with mobility and ADLs, benefits of activity, home safety, bed mobility with use of adaptive equipment or strategy, activity with nursing.    Individuals educated: Patient.  Method: Explanation.  Response to education: Verbalized understanding and Needs reinforcement.    Team Communication:   OT communicated with: RN/LPN - Ileene Musa, [place] and Dr. Tarri Fuller  OT  communicated regarding: Pre-session re: patient status, Patient position at end of session, Patient participation with Therapy, Vital signs, Further recommendations  OT/COTA communication: via written note and verbal communication as needed.    Sitting, in a chair, in the room, Needs in reach, Bed/chair alarm set, No distress and Affected Extremity elevated    Recommend client be assisted to chair for all meals outside of and in addition to OT session.    Martinique Guerino Caporale, OTR/L

## 2016-11-26 NOTE — Consults (Signed)
[facility] Patient      REASON FOR CONSULTATION: Stroke syndrome.    HISTORY OF PRESENT ILLNESS: The patient is a 66 year old  gentleman with history of chronic atrial fibrillation, type 2  diabetes mellitus, hypertension, COPD, chronic renal failure,  who was admitted on the 16th of April for cardioversion.  The  patient reportedly has reduced ejection fracture and had failed  in prior defibrillation attempts.  He underwent an  intraoperative hybrid left atrial ablation procedure with  bipolar radiofrequency (convergence procedure on the 16th).  The  patient had been previously on anticoagulation with Xarelto.  He  was noted to have acute weakness in his right arm and leg and  altered speech, for which he was referred for a stat MRI scan of  the head at 2100 hours on April 18.  The patient was noted to be  hypotensive this morning and when he was seen earlier today by  Samuel Bouche, nurse practitioner, at my request.  He was  profoundly weak in his right upper extremity of 1/5 to 2/5 and  2/5 to 3/5 in his right arm along with being lethargic.  He  required placement of a catheter into the pericardial space with  drainage of serosanguineous fluid.  His clinical course had been  complicated by progressive renal failure.  Subsequent to the  pericardial fluid drainage, the patient was noted to have  significant improvement of the right hemiparesis and improved  alertness.  At the time of my assessment, the patient notes that  his right side feels normal.  He has not noted any difficulty  with speech or visual changes.  Denies any numbness in his  extremities.  Does have chest pain.    PAST MEDICAL HISTORY:  Per Jonelle Sidle McDaniel's report.  I have  reviewed this with her and concur with her findings.    REVIEW OF SYSTEMS: Per Jonelle Sidle McDaniel's report.  I have  reviewed this with her and concur with her findings.    FAMILY HISTORY: Per Jonelle Sidle McDaniel's report.  I have reviewed  this with her and concur with her  findings.    SOCIAL HISTORY: Per Jonelle Sidle McDaniel's report.  I have reviewed  this with her and concur with her findings.    ALLERGIES: Per Jonelle Sidle McDaniel's report.  I have reviewed this  with her and concur with her findings.    MEDICATIONS: Per Jonelle Sidle McDaniel's report.  I have reviewed this  with her and concur with her findings.    PHYSICAL EXAMINATION:  NEUROLOGIC:  He is alert.  He is oriented x3.  His face is  symmetric.  Extraocular eye movements are intact.  Visual fields  are full to confrontation.  His appendicular strength is 5/5  bilaterally.  Both his upper and lower extremities reflexes are  1/4 bilaterally.  Toes are withdrawal.  He has intact response  to pinprick bilaterally.  Finger-to-nose is significant for a  mild intention tremor bilaterally.  Heel-to-shin was not  performed and gait was deferred.  GENERAL:  On physical  examination, he appears his recorded age, and no active  distress.  COR:  Regular rate and rhythm.  LUNGS:  He has intact breath sounds bilaterally on auscultation.  ABDOMEN:  Positive bowel sounds.  EXTREMITIES:  He has 1 to 2+ edema in his right lower extremity,  plus or minus edema in the left lower extremity.  No cyanosis.    LABORATORY STUDIES: I personally reviewed the CT scan of  the  head from April 18th.  There is a hypodensity within the left  frontal dorsal cortical region.  Considerations for this would  include a subacute or chronic infarction.  The patient's  creatinine is 3.79, blood sugars 230, potassium is 6.3, sodium  is 133, BUN is 75.  ALT is 78, AST is 83  WBC is 15.7, hemoglobin is 9.8, platelets 165.    The patient had carotid Dopplers in March, reportedly showed  antegrade vertebral flow and less than 50% narrowing within the  right and left internal carotid arteries.    IMPRESSION:  1.  The patient is a 66 year old gentleman with transient      right hemiparesis secondary to left hemispheric cerebral      ischemia.  2.  Acute on chronic renal  failure.    Await MRI scan of the head and MRA scans of the neck and brain  as previously scheduled.  We would recommend maintaining the  patient's blood pressure and at present in a mid range ideally  systolic of 757-972 to avoid hypoperfusion.  Pending further  evaluation of the patient's possible upper cervical or  intracranial stenosis.  Further recommendations pending the  above mentioned diagnostic studies in his course.        [id number]  DD: 11/25/2016 20:43:56  DT: 11/26/2016 00:09:57  JOB: [id number]

## 2016-11-26 NOTE — Progress Notes (Signed)
[place]  [place], [place] [address]  [facility]: [phone]   FX: [phone]    NEUROLOGY PROGRESS NOTE    Date / Time: 11/26/16 8:12 AM  [name]  Attending Physician: [name], MD      CHIEF COMPLAINT:   Stroke      HISTORY OF PRESENT ILLNESS:   [name] WCH:[id number], MRN: [id number], is a 66 y.o. male, with history of atrial fibrillation on chronic AC with Xarelto, CKD, HTN, HLD, COPD, bipolar disorder, type 2 NIDDM, diabetic neuropathy, BPH, remote hx of tobacco abuse, on whom I have been asked to consult for possible stroke.     The patient was admitted for scheduled afib ablation. Had previously failed cardioversion. Underwent successful procedure on 4/16 and was admitted to ICU following. Transferred to floor on 4/17.     He was reportedly up to bedside for breakfast and lunch yesterday, but complained of inability to walk due to SOB. Complained of abdominal pain/bloating. KUB suggestive of SBO. Access surgery following. Worsening renal function, nephrology on board.     Noted to be less coherent on 4/18 with R sided weakness. 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    4/19-Currently, he is resting in bed. Hypotensive this morning. AC is on hold. Complains of low back pain and nausea. Denies known history of prior stroke. Does not use ambulatory devices at baseline, still drives. He believes that his R sided weakness was present upon his awakening yesterday morning. Nursing reports that he is NPO, but has been approved to take medications by mouth with water, not taking much though due to his nausea.    4/20- Improved overnight, with only trace weakness in R extremities proximally. Treated for large pericardial effusion, drained, feeling much better.     REVIEW OF SYSTEMS:   A comprehensive review of systems was negative except for that which is described in the HPI above and as follows:        CURRENT PROBLEM LIST:      Active Hospital Problems    Diagnosis   . Chronic anticoagulation   . Stroke   . CKD (chronic kidney disease)   . Former smoker, stopped smoking many years ago   . Type 2 diabetes mellitus   . HTN (hypertension)   . HLD (hyperlipidemia)   . Diabetic neuropathy   . COPD (chronic obstructive pulmonary disease)   . Bipolar disorder   . BPH (benign prostatic hyperplasia)   . Atrial fibrillation   . Chronic atrial fibrillation   . Stage 4 chronic kidney disease       PAST MEDICAL HISTORY:     Past Medical History:   Diagnosis Date   . Arrhythmia    . Arthritis    . Atrial fibrillation    . Atrial flutter    . Bipolar affective    . BPH (benign prostatic hyperplasia)    . Complication of anesthesia     resp. asessment   . Diabetes mellitus    . Diabetic neuropathy    . Gout    . Heart murmur    . HOH (hard of hearing)    . Hyperlipidemia    . Hypertension    . Paroxysmal atrial fibrillation    . Pulmonary hypertension    . Renal insufficiency    . Type 2 diabetes mellitus, controlled    . Wears glasses        FAMILY HISTORY:  Family History   Problem Relation Age of Onset   . Hypertension Mother    . Diabetes Mother    . Heart disease Mother    . Hypertension Sister    . Diabetes Sister    . Hyperlipidemia Sister    . Hypertension Brother    . Heart disease Brother    . Hyperlipidemia Brother    . Hypertension Brother    . Diabetes Brother    . Hyperlipidemia Brother    . Kidney disease Brother    . Hypertension Daughter         SOCIAL HISTORY:     Social History     Social History   . Marital status: Widowed     Spouse name: N/A   . Number of children: N/A   . Years of education: N/A     Social History Main Topics   . Smoking status: Former Smoker     Packs/day: 0.30     Years: 40.00     Types: Cigarettes     Quit date: 1995   . Smokeless tobacco: Never Used      Comment: 1 pack would last a week   . Alcohol use No   . Drug use: No   . Sexual activity: Not Asked     Other Topics Concern   . None     Social History  Narrative   . None       ALLERGIES:     Allergies   Allergen Reactions   . Codeine Nausea And Vomiting       MEDICATIONS:     Prior to Admission medications    Medication Sig Start Date End Date Taking? Authorizing Provider   allopurinol (ZYLOPRIM) 300 MG tablet Take 300 mg by mouth daily.   Yes [provider]   amiodarone (PACERONE) 200 MG tablet Take 200 mg by mouth daily.   Yes [provider]   atenolol (TENORMIN) 100 MG tablet Take 100 mg by mouth 2 (two) times daily.     09/10/16  Yes [provider]   dilTIAZem (CARTIA XT) 240 MG 24 hr capsule Take 240 mg by mouth daily.   Yes [provider]   divalproex EC/DR (DEPAKOTE EC/DR) 500 MG EC tablet Take 500 mg by mouth 2 (two) times daily.   Yes [provider]   furosemide (LASIX) 40 MG tablet Take 40 mg by mouth daily.     07/16/16  Yes [provider]   furosemide (LASIX) 80 MG tablet Take 80 mg by mouth daily.       Yes [provider]   gabapentin (NEURONTIN) 100 MG capsule Take 100 mg by mouth 2 (two) times daily.   Yes [provider]   glimepiride (AMARYL) 1 MG tablet Take 0.5 mg by mouth every morning before breakfast.       Yes [provider]   lisinopril (PRINIVIL,ZESTRIL) 10 MG tablet Take 10 mg by mouth daily.     08/24/16  Yes [provider]   loperamide (IMODIUM) 2 MG capsule Take 2 mg by mouth 2 (two) times daily as needed.       Yes [provider]   metFORMIN (GLUCOPHAGE) 1000 MG tablet Twice daily with food  06/11/16  Yes [provider]   minoxidil (LONITEN) 10 MG tablet Take 30 mg by mouth daily.   Yes [provider]   Multiple Vitamin (MULTIVITAMIN) capsule Take 1 capsule by mouth daily.  Yes [provider]   pravastatin (PRAVACHOL) 40 MG tablet Take 40 mg by mouth every evening.   Yes [provider]   rivaroxaban (XARELTO) 20 MG Tab Take 20 mg by mouth daily with dinner.   Yes [provider]    tamsulosin (FLOMAX) 0.4 MG Cap Take 0.4 mg by mouth daily.   Yes [provider]       Current Facility-Administered Medications   Medication Dose Route Frequency   . acetaminophen  1,000 mg Intravenous Q8H   . albuterol-ipratropium  3 mL Nebulization Q6H SCH   . amiodarone  200 mg Oral Q12H [place]   . divalproex EC/DR tablet  500 mg Oral Q12H [place]   . docusate sodium  100 mg Oral Daily   . gabapentin  100 mg Oral Q12H [place]   . pantoprazole  40 mg Oral BID AC   . polyethylene glycol  17 g Oral Daily   . pravastatin  40 mg Oral QPM   . senna  8.6 mg Oral QHS   . sodium bicarbonate  650 mg Oral BID         sodium chloride Last Rate: 75 mL/hr at 11/26/16 0006   niCARdipine Last Rate: Stopped (11/25/16 2119)   IV fluids with sodium bicarbonate Last Rate: Stopped (11/26/16 0005)       PHYSICAL EXAM:     Vitals:    11/26/16 0713   BP: 125/76   Pulse: 71   Resp: 17   Temp: 98.1 F (36.7 C)   SpO2: 98%       General: Patient is a 66 yr old white male, in mild distress due to pain/nausea.   Mental status: He is sleepy but easily aroused, lethargic, and oriented x 3 with fluent speech and appropriate affect. Recent and remote memory appear intact.  Cranial Nerve Exam:  CN 2 Normal vision without visual field defects   CN 3,4,6 EOMs intact without nystagmus, PERRLA  CN 5 Facial sensation is symmetrical.  CN 7 Spontaneous facial expressions are symmetrical.  CN 8 Auditory acuity intact bilaterally to casual conversation.  CN 9 Palate rises with phonation.  CN 11 Decreased R shoulder shrug  CN 12 Tongue protrudes in midline.  Motor: Power is 5/5 in left extremities, He has 4/5 RUE proximal weakness with 5/5 grip. He has 4+/5 RLE proximal weakness with 4+/5 dorsi and plantar flexion.   Sensory:  Sensation intact to light touch throughout.   Coordination: He has coordinated movements in his extremities.   DTRs: 1+ and symmetric bilaterally  Gait: not assessed    HEENT:  Head is midline, normocephalic, atraumatic.   Cardiac:   Patient in sinus rhythm on tele monitoring.  Chest:  Breath sounds diminished but CTA bilaterally.    Extremities:  No deformities, cyanosis, clubbing or edema. No new rash or bruise.    SIGNIFICANT LABS:     Results     Procedure Component Value Units Date/Time    Vitamin B12 And Folate [[id number]] Collected:  11/26/16 0302    Specimen:  Plasma Updated:  11/26/16 0708     Folate 17.5 ng/mL     TSH [[id number]] Collected:  11/26/16 0302    Specimen:  Plasma Updated:  11/26/16 0632     TSH 1.81 uIU/mL     Lipid panel [[id number]]  (Abnormal) Collected:  11/26/16 0302    Specimen:  Plasma Updated:  11/26/16 0555     Cholesterol 70 (L) mg/dL  Triglycerides 78 mg/dL      HDL 21 (L) mg/dL      LDL Calculated 33 mg/dL      Coronary Heart Disease Risk 3.33     VLDL 16    Dextrose Stick Glucose [[id number]]  (Abnormal) Collected:  11/26/16 0518    Specimen:  Blood Updated:  11/26/16 0544     Glucose, POCT 164 (H) mg/dL     T4, free [[id number]] Collected:  11/26/16 0302    Specimen:  Plasma Updated:  11/26/16 0540     T4 Free 0.87 ng/dL     Hemoglobin A1C [[id number]] Collected:  11/26/16 0302    Specimen:  Blood Updated:  11/26/16 0411     Hgb A1C, % 6.5 %     CBC and differential [[id number]]  (Abnormal) Collected:  11/26/16 0302    Specimen:  Blood from Blood Updated:  11/26/16 0342     WBC 12.6 (H) K/cmm      RBC 2.58 (L) M/cmm      Hemoglobin 7.9 (L) gm/dL      Hematocrit 23.8 (L) %      MCV 92 fL      MCH 31 pg      MCHC 33 gm/dL      RDW 15.5 (H) %      PLT CT 163 K/cmm      MPV 8.0 fL      NEUTROPHIL % 88.0 (H) %      Lymphocytes 4.0 (L) %      Monocytes 4.0 %      Eosinophils % 0.0 %      Basophils % 0.0 %      Bands 2 %      Myelocytes 2 (H) %      Neutrophils Absolute 11.6 (H) K/cmm      Lymphocytes Absolute 0.5 (L) K/cmm      Monocytes Absolute 0.5 K/cmm      Eosinophils Absolute 0.0 K/cmm      BASO Absolute 0.0 K/cmm      RBC Morphology RBC Morphology Reviewed Morphology Consistent with Hemogram      Polychromasia 1+     Poikilocytosis 1+    Narrative:       Manual differential performed    Renal function panel [[id number]]  (Abnormal) Collected:  11/26/16 0302    Specimen:  Plasma Updated:  11/26/16 0339     Sodium 137 mMol/L      Potassium 4.8 mMol/L      Chloride 102 mMol/L      CO2 23.8 mMol/L      Calcium 8.0 (L) mg/dL      Glucose 144 (H) mg/dL      Creatinine 4.64 (H) mg/dL      BUN 93 (H) mg/dL      Albumin 2.6 (L) gm/dL      Phosphorus 7.2 (H) mg/dL      Anion Gap 16.0 mMol/L      BUN/Creatinine Ratio 20.0 Ratio      EGFR 12 (L) mL/min/1.6m2      Osmolality Calc 305 (H) mOsm/kg     Ammonia [[id number]]  (Abnormal) Collected:  11/26/16 0302    Specimen:  Plasma Updated:  11/26/16 0331     Ammonia 134.8 (H) mcg/dL     Dextrose Stick Glucose [[id number]]  (Abnormal) Collected:  11/25/16 2352    Specimen:  Blood Updated:  11/26/16 0029     Glucose, POCT 175 (H) mg/dL  Hemogram [[id number]]  (Abnormal) Collected:  11/25/16 1801    Specimen:  Blood from Blood Updated:  11/25/16 1852     WBC 14.1 (H) K/cmm      RBC 3.03 (L) M/cmm      Hemoglobin 8.9 (L) gm/dL      Hematocrit 28.2 (L) %      MCV 93 fL      MCH 29 pg      MCHC 32 gm/dL      RDW 15.9 (H) %      PLT CT 162 K/cmm      MPV 8.3 fL     Basic Metabolic Panel [[id number]]  (Abnormal) Collected:  11/25/16 1800    Specimen:  Plasma Updated:  11/25/16 1850     Sodium 136 mMol/L      Potassium 5.5 (H) mMol/L      Chloride 102 mMol/L      CO2 20.1 mMol/L      Calcium 8.8 mg/dL      Glucose 133 (H) mg/dL      Creatinine 4.60 (H) mg/dL      BUN 91 (H) mg/dL      Anion Gap 19.4 (H) mMol/L      BUN/Creatinine Ratio 19.8 Ratio      EGFR 12 (L) mL/min/1.75m2      Osmolality Calc 302 (H) mOsm/kg     Hemoglobin [[id number]]  (Abnormal) Collected:  11/25/16 1728    Specimen:  Blood Updated:  11/25/16 1729     Hemoglobin 10.5 (L) gm/dL     Cell Count-Fluid [[id number]] Collected:  11/25/16 1600    Specimen:  Body Fluid Updated:  11/25/16 1728     Fluid Type: Pericardial      Total NUCLEATED CELL COUNT 15,187 /cmm      Body Fluid RBC 7,628,315 /cmm      Neutrophil Count, Fluid 90 %      Body Fluid Lymphocytes 5 %      Mono, Fluid 5 %     i-Stat CG4 Arterial CartrIDge [[id number]]  (Abnormal) Collected:  11/25/16 1354    Specimen:  Arterial Updated:  11/25/16 1407     pH, ISTAT 7.36     PO2, ISTAT 77 mm Hg      BE, ISTAT -6 (L) mMol/L      HCO3, ISTAT 18.5 (L) mMol/L      PCO2, ISTAT 33.2 (L) mm Hg      O2 Sat, %, ISTAT 95 (L) %      Room Number, ISTAT 0310     i-STAT Allen's Test Pass     DELS, ISTAT Nasal Can     i-STAT FIO2 0.00 %      i-STAT Liters Per Minute 3 L/min      Sample, ISTAT Arterial     Site, ISTAT L Radial     TCO2, ISTAT 19 (L) mMol/L      i-STAT Lactic acid 2.74 (HH) mMol/L      Operator, ISTAT Operator: [place] RT    Crossmatch PRBCS, 2 Units [[id number]] Collected:  11/15/16 1110    Specimen:  Blood Updated:  11/25/16 1254     01 - Product ID Red Blood Cells     01 - Unit Number [id number]     01 -   Cross Match Compatible     01 -   Status Info Released     01 - Product Code [id number]     01 -  Blood Type O Pos     01 -   Issue Date/Time [id number]     02 - Product ID Red Blood Cells     02 - Unit Number [id number]     02 -   Cross Match Compatible     02 -   Status Info Released     02 - Product Code [id number]     97 -   Blood Type O Pos     02 -   Issue Date/Time [id number]    Narrative:       Other: please specify in comments  No special requirements    Dextrose Stick Glucose [[id number]]  (Abnormal) Collected:  11/25/16 1157    Specimen:  Blood Updated:  11/25/16 1213     Glucose, POCT 167 (H) mg/dL     CBC with Automated Differential [[id number]]  (Abnormal) Collected:  11/25/16 1120    Specimen:  Blood from Blood Updated:  11/25/16 1208     WBC 15.7 (H) K/cmm      RBC 3.27 (L) M/cmm      Hemoglobin 9.8 (L) gm/dL      Hematocrit 30.1 (L) %      MCV 92 fL      MCH 30 pg      MCHC 32 gm/dL      RDW 15.8 (H) %      PLT CT 165 K/cmm      MPV 8.5 fL       NEUTROPHIL % 80.0 (H) %      Lymphocytes 5.0 (L) %      Monocytes 14.0 %      Eosinophils % 0.0 %      Basophils % 0.0 %      Bands 1 %      Neutrophils Absolute 12.7 (H) K/cmm      Lymphocytes Absolute 0.8 K/cmm      Monocytes Absolute 2.2 (H) K/cmm      Eosinophils Absolute 0.0 K/cmm      BASO Absolute 0.0 K/cmm      RBC Morphology RBC Morphology Reviewed Morphology Consistent with Hemogram     Anisocytosis 1+     Polychromasia 1+     Poikilocytosis 1+    Narrative:       Manual differential performed    Basic Metabolic Panel [[id number]]  (Abnormal) Collected:  11/25/16 1120    Specimen:  Plasma Updated:  11/25/16 1205     Sodium 134 (L) mMol/L      Potassium 6.4 (HH) mMol/L      Chloride 104 mMol/L      CO2 15.6 (L) mMol/L      Calcium 9.4 mg/dL      Glucose 169 (H) mg/dL      Creatinine 4.21 (H) mg/dL      BUN 85 (H) mg/dL      Anion Gap 20.8 (H) mMol/L      BUN/Creatinine Ratio 20.2 Ratio      EGFR 14 (L) mL/min/1.49m2      Osmolality Calc 298 mOsm/kg     Magnesium [[id number]]  (Abnormal) Collected:  11/25/16 1120    Specimen:  Plasma Updated:  11/25/16 1153     Magnesium 2.9 (H) mg/dL     Sodium, urine, random [[id number]] Collected:  11/25/16 0959    Specimen:  Urine, Random Updated:  11/25/16 1035     Sodium, UR 38 mMol/L     Creatinine, urine, random [[id number]] Collected:  11/25/16 0959  Specimen:  Urine, Random Updated:  11/25/16 1035     Creatinine, UR 121.42 mg/dL           SIGNIFICANT IMAGING:     XR Chest AP Portable   Final Result   Stable pulmonary vascular congestion and left basilar atelectasis or infiltrate.   Probable small left pleural effusion is unchanged.                  ReadingStation:[id number]      Pericardiocentesis   Final Result      Echocardiogram Adult Limited WO Clr/Dopp Waveform   Final Result      Echocardiogram Adult Limited [place]   Final Result      XR Abdomen Portable   Final Result   No acute abnormality.      ReadingStation:[email or web address]      CT Head WO  Contrast   Final Result   1. No evidence of intracranial hemorrhage.      2. Probable subacute infarct superior LEFT frontoparietal lobe gyrus.      3. If clinically indicated, MRI may be helpful in further evaluation.      ReadingStation:[email or web address]      XR Abdomen AP   Final Result   Findings worrisome for partial proximal or mid small bowel obstruction.      ReadingStation:[email or web address]      XR Chest AP Portable   Final Result   Cardiomegaly with bilateral airspace disease or edema with small left effusion. No observed pneumothorax. Interval removal right IJ central line.      ReadingStation:[email or web address]      X-ray chest AP portable   Final Result      1. Pulmonary vascular congestion without overt failure   2. Left basilar opacity likely combination of atelectasis, consolidation and small effusion.   3. Catheter appliances as above      ReadingStation:[email or web address]      XR Chest AP Only   Final Result   No pneumothorax or significant acute pulmonary abnormalities.      Small amount of subcutaneous emphysema in the anterior chest.      ReadingStation:[email or web address]      FLUORO-NO CHARGE (HYBRID ROOM ONLY)   Final Result      MRI Brain WO Contrast    (Results Pending)   MR Angiogram Head WO Contrast    (Results Pending)   MR Angiogram Neck WO Contrast    (Results Pending)           ASSESSMENT AND PLAN:   [name], 66 yr old male, who presents with R side dominant weakness, likely acute CVA.     1. Diagnostics:  1. MRI brain  2. MRA head/neck wo contrast  2. Labs: LDL 33, HA1c 6.5, ammonia 134.8  3. Recommend SBP goal 120-160 while awaiting vessel studies.   4. Medications: Will need to continue his anticoagulation when appropriate, statin.   5. Rehabilitation services: PT/OT  6. Consults:    1. Nephrology following  2. Access following  7. Vitals and neuro checks q 4 hrs    Case discussed with Dr. Randall An who will additionally review diagnostics/labs, personally evaluate the patient, further determine the treatment plan, and  amend this note as indicated. Thank you for the consultation.    Melina Fiddler, PA-C  11/26/2016  8:12 AM  [phone]  I reviewed the comments by Alanson Aly and agree.  On my exam this AM   Speech is clear right arm and leg are  5/5  Bright affect, cognitively intact    A/P: Left hemispheric ischemic episode- marked inprovement, awaited studies as indicated  above.

## 2016-11-26 NOTE — Progress Notes (Signed)
Cardiology Progress Note      Date Time: 11/26/16 6:37 PM  [name], [name]      Assessment:     Active Hospital Problems    Diagnosis   . Chronic anticoagulation   . Stroke   . CKD (chronic kidney disease)   . Former smoker, stopped smoking many years ago   . Type 2 diabetes mellitus   . HTN (hypertension)   . HLD (hyperlipidemia)   . Diabetic neuropathy   . COPD (chronic obstructive pulmonary disease)   . Bipolar disorder   . BPH (benign prostatic hyperplasia)   . Atrial fibrillation   . Chronic atrial fibrillation   . Stage 4 chronic kidney disease             Plan:     1. He feels much better. Unchanged pericardial effusion (small focal collection near right atrium).       Subjective:   Feels better, right arm and leg weakness has improved.     Physical Exam:     Temp:  [98.1 F (36.7 C)-98.7 F (37.1 C)] 98.6 F (37 C)  Heart Rate:  [68-91] 69  Resp Rate:  [13-24] 18  BP: (92-141)/(52-84) 124/73    Wt Readings from Last 3 Encounters:   11/26/16 87.8 kg (193 lb 9 oz)   10/21/16 85.5 kg (188 lb 9.6 oz)   10/07/16 84 kg (185 lb 3.2 oz)            Intake/Output Summary (Last 24 hours) at 11/26/16 1837  Last data filed at 11/26/16 1809   Gross per 24 hour   Intake          4803.34 ml   Output             4137 ml   Net           666.34 ml       General appearance - alert, well appearing, and in no distress  Chest -clear to auscultation, no wheezes, rales or rhonchi, symmetric air entry  Heart - soft friction rub  Extremities - 2+ lower extremity edema  Abd: soft. Non-tender, non-distended,  Neuro: alert and oriented  Skin: no rashes  Psych: normal affect    Medications:     Scheduled Meds:   albuterol-ipratropium 3 mL Nebulization Q6H [place]   amiodarone 200 mg Oral Q12H SCH   divalproex EC/DR tablet 500 mg Oral Q12H SCH   docusate sodium 100 mg Oral Daily   gabapentin 100 mg Oral Q12H SCH   pantoprazole 40 mg Oral BID AC   polyethylene glycol 17 g Oral Daily   pravastatin 40 mg Oral QPM   senna  8.6 mg Oral QHS   sodium bicarbonate 650 mg Oral BID       . sodium chloride 75 mL/hr at 11/26/16 0006   . niCARdipine Stopped (11/25/16 2119)         Labs:       Recent Labs  Lab 11/26/16  1157 11/26/16  0302 11/25/16  1800 11/25/16  1120 11/25/16  0454   Glucose 237* 144* 133* 169* 230*   BUN 87* 93* 91* 85* 75*   Creatinine 4.25* 4.64* 4.60* 4.21* 3.79*   Sodium 132* 137 136 134* 133*   Potassium 4.2 4.8 5.5* 6.4* 6.3*   Chloride 98 102 102 104 102   CO2 22.8 23.8 20.1 15.6* 19.4*   Magnesium  --   --   --  2.9*  --  AST (SGOT)  --   --   --   --  83*   ALT  --   --   --   --  78*     Estimated Creatinine Clearance: 17.8 mL/min (A) (based on SCr of 4.25 mg/dL (H)).      Recent Labs  Lab 11/26/16  0302   CHOL 70*   TRIG 78   HDL 21*   LDL 33         Recent Labs  Lab 11/26/16  0302 11/25/16  1801 11/25/16  1728 11/25/16  1120  11/23/16  0340  11/22/16  1540   WBC 12.6* 14.1*  --  15.7* More results in Results Review 15.2* More results in Results Review 15.0*   Hemoglobin 7.9* 8.9* 10.5* 9.8* More results in Results Review 11.0* More results in Results Review 12.6*   Hematocrit 23.8* 28.2*  --  30.1* More results in Results Review 34.0* More results in Results Review 38.2*   PLT CT 163 162  --  165 More results in Results Review 199 More results in Results Review 221   PT INR  --   --   --   --   --  1.3  --  1.3   More results in Results Review = values in this interval not displayed.    No results for input(s): TROPI, CK, CKMB in the last 8760 hours.      Recent Labs  Lab 11/26/16  0302   TSH 1.81       Radiology: all results from this admission  Xr Chest 2 Views    Result Date: 11/15/2016  Cardiomegaly with cephalization of pulmonary vascular flow. No focal infiltrate, pleural effusion, or pneumothorax. ReadingStation:[email or web address]    Xr Abdomen Ap    Result Date: 11/24/2016  Findings worrisome for partial proximal or mid small bowel obstruction. ReadingStation:[email or web address]    Ct Head Wo Contrast    Result Date:  11/24/2016  1. No evidence of intracranial hemorrhage. 2. Probable subacute infarct superior LEFT frontoparietal lobe gyrus. 3. If clinically indicated, MRI may be helpful in further evaluation. ReadingStation:[email or web address]    Xr Chest Ap Only    Result Date: 11/22/2016  No pneumothorax or significant acute pulmonary abnormalities. Small amount of subcutaneous emphysema in the anterior chest. ReadingStation:[email or web address]    Xr Chest Ap Portable    Result Date: 11/26/2016  Stable pulmonary vascular congestion and left basilar atelectasis or infiltrate. Probable small left pleural effusion is unchanged. ReadingStation:[id number]    Xr Chest Ap Portable    Result Date: 11/24/2016  Cardiomegaly with bilateral airspace disease or edema with small left effusion. No observed pneumothorax. Interval removal right IJ central line. ReadingStation:[email or web address]    X-ray Chest Ap Portable    Result Date: 11/23/2016  1. Pulmonary vascular congestion without overt failure 2. Left basilar opacity likely combination of atelectasis, consolidation and small effusion. 3. Catheter appliances as above ReadingStation:[email or web address]    Xr Abdomen Portable    Result Date: 11/25/2016  No acute abnormality. Rowe, DO

## 2016-11-26 NOTE — Progress Notes (Signed)
Lab work resulted, Creatinine decreasing appropriately. Potassium lowered from 4.2 to 3.8. Called EICU to see if MD wants to give replacement. Awaiting orders.

## 2016-11-27 LAB — CBC AND DIFFERENTIAL
Bands: 2 % (ref 0–10)
Basophils %: 0 % (ref 0.0–3.0)
Basophils Absolute: 0 10*3/uL (ref 0.0–0.3)
Eosinophils %: 2 % (ref 0.0–7.0)
Eosinophils Absolute: 0.3 10*3/uL (ref 0.0–0.8)
Hematocrit: 26.1 % — ABNORMAL LOW (ref 39.0–52.5)
Hemoglobin: 8.4 gm/dL — ABNORMAL LOW (ref 13.0–17.5)
Lymphocytes Absolute: 1.3 10*3/uL (ref 0.6–5.1)
Lymphocytes: 9 % — ABNORMAL LOW (ref 15.0–46.0)
MCH: 30 pg (ref 28–35)
MCHC: 32 gm/dL (ref 32–36)
MCV: 92 fL (ref 80–100)
MPV: 7.4 fL (ref 6.0–10.0)
Monocytes Absolute: 1.9 10*3/uL — ABNORMAL HIGH (ref 0.1–1.7)
Monocytes: 13 % (ref 3.0–15.0)
Neutrophils %: 74 % (ref 42.0–78.0)
Neutrophils Absolute: 10.9 10*3/uL — ABNORMAL HIGH (ref 1.7–8.6)
PLT CT: 177 10*3/uL (ref 130–440)
RBC: 2.83 10*6/uL — ABNORMAL LOW (ref 4.00–5.70)
RDW: 15.6 % — ABNORMAL HIGH (ref 11.0–14.0)
WBC: 14.4 10*3/uL — ABNORMAL HIGH (ref 4.0–11.0)

## 2016-11-27 LAB — CBC
Hematocrit: 26.1 % — ABNORMAL LOW (ref 39.0–52.5)
Hematocrit: 26.7 % — ABNORMAL LOW (ref 39.0–52.5)
Hemoglobin: 8.4 gm/dL — ABNORMAL LOW (ref 13.0–17.5)
Hemoglobin: 8.5 gm/dL — ABNORMAL LOW (ref 13.0–17.5)
MCH: 30 pg (ref 28–35)
MCH: 29 pg (ref 28–35)
MCHC: 32 gm/dL (ref 32–36)
MCHC: 32 gm/dL (ref 32–36)
MCV: 92 fL (ref 80–100)
MCV: 92 fL (ref 80–100)
MPV: 7.3 fL (ref 6.0–10.0)
MPV: 7.2 fL (ref 6.0–10.0)
PLT CT: 191 10*3/uL (ref 130–440)
PLT CT: 192 10*3/uL (ref 130–440)
RBC: 2.83 10*6/uL — ABNORMAL LOW (ref 4.00–5.70)
RBC: 2.91 10*6/uL — ABNORMAL LOW (ref 4.00–5.70)
RDW: 15.5 % — ABNORMAL HIGH (ref 11.0–14.0)
RDW: 15.7 % — ABNORMAL HIGH (ref 11.0–14.0)
WBC: 14.4 10*3/uL — ABNORMAL HIGH (ref 4.0–11.0)
WBC: 13.6 10*3/uL — ABNORMAL HIGH (ref 4.0–11.0)

## 2016-11-27 LAB — VH DEXTROSE STICK GLUCOSE
Glucose POCT: 128 mg/dL — ABNORMAL HIGH (ref 71–99)
Glucose POCT: 156 mg/dL — ABNORMAL HIGH (ref 71–99)
Glucose POCT: 228 mg/dL — ABNORMAL HIGH (ref 71–99)
Glucose POCT: 203 mg/dL — ABNORMAL HIGH (ref 71–99)
Glucose POCT: 251 mg/dL — ABNORMAL HIGH (ref 71–99)

## 2016-11-27 LAB — RENAL FUNCTION PANEL
Albumin: 2.7 gm/dL — ABNORMAL LOW (ref 3.5–5.0)
Anion Gap: 10.7 mMol/L (ref 7.0–18.0)
BUN / Creatinine Ratio: 22.6 Ratio (ref 10.0–30.0)
BUN: 53 mg/dL — ABNORMAL HIGH (ref 7–22)
CO2: 29.6 mMol/L (ref 20.0–30.0)
Calcium: 8.4 mg/dL — ABNORMAL LOW (ref 8.5–10.5)
Chloride: 101 mMol/L (ref 98–110)
Creatinine: 2.34 mg/dL — ABNORMAL HIGH (ref 0.80–1.30)
EGFR: 28 mL/min/{1.73_m2} — ABNORMAL LOW (ref 60–150)
Glucose: 218 mg/dL — ABNORMAL HIGH (ref 71–99)
Osmolality Calc: 295 mOsm/kg (ref 275–300)
Phosphorus: 3.3 mg/dL (ref 2.3–4.7)
Potassium: 4.3 mMol/L (ref 3.5–5.3)
Sodium: 137 mMol/L (ref 136–147)

## 2016-11-27 LAB — PT/INR
PT INR: 1.4 — ABNORMAL HIGH (ref 0.5–1.3)
PT: 14.1 s — ABNORMAL HIGH (ref 9.5–11.5)

## 2016-11-27 LAB — VH APTT( HEPARIN INFUSION THERAPY): aPTT: 48.8 s — ABNORMAL HIGH (ref 24.0–34.0)

## 2016-11-27 LAB — APTT: aPTT: 30.6 s (ref 24.0–34.0)

## 2016-11-27 MED ORDER — VH HEPARIN SODIUM (PORCINE) 10000 UNIT/ML IJ SOLN (INITIAL BOLUS)
4000.0000 [IU] | Freq: Once | INTRAMUSCULAR | Status: AC
Start: 2016-11-27 — End: 2016-11-27
  Administered 2016-11-27: 13:00:00 4000 [IU] via INTRAVENOUS
  Filled 2016-11-27: qty 1

## 2016-11-27 MED ORDER — MORPHINE SULFATE 4 MG/ML IJ/IV SOLN (WRAP)
2.0000 mg | Status: DC | PRN
Start: 2016-11-27 — End: 2016-11-28
  Administered 2016-11-28 (×4): 2 mg via INTRAVENOUS
  Filled 2016-11-27 (×4): qty 1

## 2016-11-27 MED ORDER — LANTUS SOLOSTAR 100 UNIT/ML SC SOPN
10.0000 [IU] | PEN_INJECTOR | Freq: Every evening | SUBCUTANEOUS | Status: DC
Start: 2016-11-27 — End: 2016-11-28
  Administered 2016-11-27: 23:00:00 10 [IU] via SUBCUTANEOUS
  Filled 2016-11-27: qty 3

## 2016-11-27 MED ORDER — VH HEPARIN SODIUM (PORCINE) 10000 UNIT/ML IJ SOLN (REBOLUS)
3000.0000 [IU] | Freq: Four times a day (QID) | INTRAMUSCULAR | Status: DC | PRN
Start: 2016-11-27 — End: 2016-11-28

## 2016-11-27 MED ORDER — VH HEPARIN (PORCINE) IN D5W 50-5 UNIT/ML-% IV SOLN
0.0000 [IU]/h | INTRAVENOUS | Status: DC
Start: 2016-11-27 — End: 2016-11-28
  Administered 2016-11-27: 13:00:00 1000 [IU]/h via INTRAVENOUS
  Filled 2016-11-27: qty 500

## 2016-11-27 NOTE — Plan of Care (Signed)
Problem: Moderate/High Fall Risk Score >5  Goal: Patient will remain free of falls  Outcome: Progressing     Problem: Ineffective Gas Exchange  Goal: Effective breathing pattern  Outcome: Progressing

## 2016-11-27 NOTE — Progress Notes (Signed)
[place]   Daily Progress Note      [name], [name]  Date/Time: 11/27/16 10:49 AM  Attending Physician: [name], MD  Location/Room: 3548/3548-A     Interval Events:   Scan't drainage 5 pericardial catheter  Right upper extremity weakness resolved  Creatinine improving, fluid balance is negative approximately 1 L      Subjective:   No shortness breath, productive cough or chest pain    Problem List:     Active Hospital Problems    Diagnosis   . Chronic anticoagulation   . Stroke   . CKD (chronic kidney disease)   . Former smoker, stopped smoking many years ago   . Type 2 diabetes mellitus   . HTN (hypertension)   . HLD (hyperlipidemia)   . Diabetic neuropathy   . COPD (chronic obstructive pulmonary disease)   . Bipolar disorder   . BPH (benign prostatic hyperplasia)   . Atrial fibrillation   . Chronic atrial fibrillation   . 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  The procedure itself was uncomplicated.  The patient was  extubated without difficulty.  On arrival to the intensive care unit, a 500 cc bolus of fluid was drained from the mediastinal via the pericardial drain.  It is unclear whether or not the collection of blood was related pooling during the ablation procedure or whether it represented active ongoing bleeding.  There was no evidence of pericardial tamponade via abnormal hemodynamics and there was no evidence of an enlarging cardiac silhouette. Initially, the patient did well but then began  to experience increasing dyspnea and worsening renal function. A repeat echocardiogram revealed a large circumferential pericardial effusion. He underwent placement of a pericardial pigtail catheter. He developed a new right hemiplegia right upper greater than right lower and was sent for a CT scan of the brain on 11/24/2016 which showed a likely subacute infarct of the superior left frontoparietal lobe gyrus assistant with an acute CVA syndrome. Creatinine is improved in (acute on chronic renal failure, baseline creatinine appears to be approximately 1.6)  Plan:     Cardiovascular: Hemodynamically stable, scant drainage 5 pericardial catheter, heparin to be started today ?Remove pericardial catheter in a.m. The patient remains in sinus rhythm    Pulmonary: Remote history of tobacco abuse and suggestion of severe obstructive airways disease based on preop pulmonary spirometry. Pulmonary function tests are not available for review . Continue current pulmonary toilet.    Gastrointestinal: Surgical evaluation is inconsistent with with an acute bowel obstruction-apparently resolved. Diet has been progressed to a postoperative solids diet-follow-up and observe     Infectious Disease: No active issues     Neurologic: Patient with possible extension of previous CVA involving the superior left frontoparietal lobe gyrus with ?reversibly increased neuro deficits secondary to hypoperfusion. Right upper extremity neuro deficit is resolved, continue PT/OT    Renal: Fluid balance is -900 mL, creatinine improving, urine output is increasing-watch for post ATN diuresis, fluid management as per renal     Hem/Onc: Hemoglobin/hematocrit stable, platelets decreased . Patient to start intravenous heparin today-observe    Endocrine:  Serial serum glucoses remain elevated despite sliding scale insulin coverage, we'll add Lantus     ICU Management:  Sedation: None needed.  Nutrition: Postop solid diet  GI Prophylaxis: PPI bid  VTE  Prophylaxis: Intravenous heparin  Foley Catheter: continue    Disposition: Keep in ICU  Code Status: Full Code    Physical Exam:   Vitals: BP 122/71   Pulse 71   Temp 98.2 F (36.8 C) (Oral)   Resp 18   Ht 1.702 m (5\' 7" )   Wt 88.1 kg (194 lb 3.6 oz)   SpO2 99%   BMI 30.42 kg/m   I/O:     Intake/Output Summary (Last 24 hours) at 11/27/16 1049  Last data filed at 11/27/16 0800   Gross per 24 hour   Intake          3318.45 ml   Output           4315.1 ml   Net          -996.65 ml     Vent settings:      General Appearance:  Well-developed, well-nourished 66 year old white male who is alert and oriented 3 in no apparent respiratory distress     Mental status: Alert, oriented 3     Neuro:Muscle strength 5/5 upper and lower extremities bilaterally, no sensory deficit appreciated     HEENT:NCAT     Neck: No cervical adenopathy     Lungs:Breath sounds are normal, few coarse rales at lung bases, no rhonchi, no wheezing     Cardiac: Regular rate and rhythm, S1, S2, no S3, no S4, no murmurs, rubs or gallops     Abdomen: Bowel sounds are normal, abdomen is soft, distended, nontender with no gross hepatosplenomegaly     Extremities:No cyanosis, clubbing or edema     Skin: No acute skin breakdown appreciated     Review of Systems:   Pertinent items are noted in HPI. All other systems are negative.    Labs:   Interval Labs in the [facility] record were reviewed including the following:  CBC:     Recent Labs  Lab 11/27/16  0348 11/26/16  2240 11/26/16  0302   WBC 14.4* 13.9* 12.6*   RBC 2.83* 2.95* 2.58*   Hemoglobin 8.4* 8.9* 7.9*   Hematocrit 26.1* 27.2* 23.8*   MCV 92 92 92   PLT CT 177 183 163     Chemistry:   Recent Labs  Lab 11/26/16  2240 11/26/16  1157 11/26/16  0302 11/25/16  1800 11/25/16  1120  11/23/16  0340   Sodium 135* 132* 137 136 134* More results in Results Review 142   Potassium 3.8 4.2 4.8 5.5* 6.4* More results in Results Review 4.2   Chloride 100 98 102 102 104 More results in Results Review 111*   CO2  27.2 22.8 23.8 20.1 15.6* More results in Results Review 23.5   BUN 75* 87* 93* 91* 85* More results in Results Review 31*   Creatinine 3.25* 4.25* 4.64* 4.60* 4.21* More results in Results Review 1.62*   Glucose 134* 237* 144* 133* 169* More results in Results Review 120*   Calcium 8.3* 7.9* 8.0* 8.8 9.4 More results in Results Review 8.7   Magnesium  --   --   --   --  2.9*  --  2.3   More results in Results Review = values in this interval not displayed.  LFTs:     Recent Labs  Lab  11/26/16  2240 11/26/16  1157 11/26/16  0302 11/25/16  0454   ALT  --   --   --  78*   AST (SGOT)  --   --   --  83*   Bilirubin, Total  --   --   --  1.4*   Albumin 2.9* 2.8* 2.6* 3.3*   Alkaline Phosphatase  --   --   --  66     Cardiac Enzymes:     Coags:     Recent Labs  Lab 11/23/16  0340 11/22/16  1540   PT INR 1.3 1.3   PT 13.2* 13.1*       Radiology / Imaging:     Imaging personally reviewed by me, including CXR: No chest x-ray today     Attestation & Billing:   Patient's condition and plan discussed with: patient, family and consultants    This patient has a high probability of sudden clinically significant deterioration which requires the highest level of physician preparedness to intervene urgently. I managed/supervised life or organ supporting interventions that required frequent physician assessments. I devoted my full attention in the ICU to the direct care of this patient for this period of time.    Any critical care time was performed today and is exclusive of teaching, billable procedures, and not overlapping with any other providers.    Billing: Critical care time: 40 minutes.    Signed by: Robyn Haber, MD   OF:[email or web address], Delphia Grates, MD

## 2016-11-27 NOTE — Progress Notes (Signed)
Renal Progress Note    Follow up for:  ARF  CKD, stage 3    Subjective:     Feels he is getting stronger    ROS negative for: SOB, CP, F/C, N/V/D, pruritis.    Objective:     Vitals: BP (!) 170/102   Pulse 74   Temp 98.2 F (36.8 C) (Oral)   Resp 18   Ht 1.702 m (5\' 7" )   Wt 88.1 kg (194 lb 3.6 oz)   SpO2 97%   BMI 30.42 kg/m   Current BP reading appears spurious compared to all previous entries; repeat reading to follow    I/O:     Intake/Output Summary (Last 24 hours) at 11/27/16 1601  Last data filed at 11/27/16 1400   Gross per 24 hour   Intake          3167.95 ml   Output           4465.1 ml   Net         -1297.15 ml       Weights:  Wt Readings from Last 1 Encounters:   11/27/16 0553 88.1 kg (194 lb 3.6 oz)   11/26/16 0525 87.8 kg (193 lb 9 oz)   11/25/16 0540 86.3 kg (190 lb 4.8 oz)   11/24/16 0549 86.7 kg (191 lb 2.2 oz)   11/22/16 0608 85.6 kg (188 lb 11.4 oz)   11/15/16 1033 85.3 kg (188 lb)       Physical Exam:  General: No distress.  Lungs: CTA  Heart: RRR, no murmur, gallop, rub, or heave.  Abdomen: BS present, soft, NT/ND, no organomegaly.  Extremities: trace edema.  Access: N/A        Labs:  Labs reviewed.  Radiology results reviewed.  Pertinent findings below.  Recent Labs      11/27/16   1205  11/27/16   0348   WBC  14.4*  14.4*   Hemoglobin  8.4*  8.4*   Hematocrit  26.1*  26.1*   PLT CT  191  177     Recent Labs      11/27/16   1205   PT  14.1*   PT INR  1.4*   aPTT  30.6     No results for input(s): TROPI, CK, CKMBINDEX in the last 72 hours.  No results for input(s): BNP in the last 72 hours.        No results found.   Recent Labs      11/27/16   1205  11/26/16   2240  11/26/16   1157   Glucose  218*  134*  237*   Sodium  137  135*  132*   Potassium  4.3  3.8  4.2   Chloride  101  100  98   CO2  29.6  27.2  22.8   BUN  53*  75*  87*   Creatinine  2.34*  3.25*  4.25*   EGFR  28*  19*  13*   Calcium  8.4*  8.3*  7.9*     Recent Labs      11/27/16   1205  11/26/16   2240  11/26/16   1157    11/25/16   1120   Magnesium   --    --    --    --   2.9*   Phosphorus  3.3  4.8*  6.4*   < >   --     < > =  values in this interval not displayed.     Recent Labs      11/27/16   1205  11/26/16   2240  11/26/16   1157   11/25/16   0454   Albumin  2.7*  2.9*  2.8*   < >  3.3*   Protein, Total   --    --    --    --   6.6   Bilirubin, Total   --    --    --    --   1.4*   Alkaline Phosphatase   --    --    --    --   66   ALT   --    --    --    --   78*   AST (SGOT)   --    --    --    --   83*    < > = values in this interval not displayed.    No results for input(s): SGUR, LABPH, PROTEINUR, GLUUA, KETONESUA, BILIUA, BLOODUA, NITRITEUA, UROBILIUA, LEUA, NITRITEUA, WBCUA, RBCUA, BACTERIAUA in the last 72 hours.    Invalid input(s):  AMORPHOUSUA               Assessment:     1. AKI - pre-renal ischemia +/- ATN resulting from pericardial effusion; peak creatinine 4.64 this morning, now improving steadily with brisk urine output  2. Stage III chronic kidney disease-with apparent baseline creatinine 1.6-2.1 mg/dL; most recent creatinine value on outpatient basis had been 2.0 mg/dL on 11/15/16. Suspect underlying diabetic/hypertensive kidney disease.  3. Hyperkalemia-associated with reduced renal function and ACE inhibitor use; also component of hyperglycemia  4. Metabolic acidosis-associated with reduced renal function, resolved  5. Ileus, improved  6. Status post hybrid left atrial ablation (convergent procedure) for atrial fibrillation  7. Hypertension  8. Subacute CVA of left frontoparietal lobes  9. Type 2 diabetes mellitus      Plan:      sCr continues to improve steadily following pericardial drainage   Maintenance IVFs as ordered for now with additional Prn boluses to maintain goal SBP of 120-160   Ok to reduce blood draws to daily from my standpoint   Ok to remove foley from my standpoint; the patient requests it remain in until he is "a bit" more mobile.      Orders and Medications reviewed in Epic.  Case  discussed with RN.    Blanchard Mane, Pager [phone]  4:01 PM  11/27/2016  [facility]  Dr. Hurley Cisco, Dr. Josephina Shih, Dr. Fernande Boyden  Office: [phone]

## 2016-11-27 NOTE — Progress Notes (Signed)
Cardiology Progress Note      Date Time: 11/27/16 3:23 PM  [name], [name]      Assessment:     Active Hospital Problems    Diagnosis   . Chronic anticoagulation   . Stroke   . CKD (chronic kidney disease)   . Former smoker, stopped smoking many years ago   . Type 2 diabetes mellitus   . HTN (hypertension)   . HLD (hyperlipidemia)   . Diabetic neuropathy   . COPD (chronic obstructive pulmonary disease)   . Bipolar disorder   . BPH (benign prostatic hyperplasia)   . Atrial fibrillation   . Chronic atrial fibrillation   . Stage 4 chronic kidney disease             Plan:     1. In all measures better. Discussed plan with Dr. Call and Dr. Theotis Burrow.       Subjective:   Feels good.     Physical Exam:     Temp:  [98.1 F (36.7 C)-98.6 F (37 C)] 98.2 F (36.8 C)  Heart Rate:  [69-79] 74  Resp Rate:  [15-22] 18  BP: (118-170)/(68-102) 170/102    Wt Readings from Last 3 Encounters:   11/27/16 88.1 kg (194 lb 3.6 oz)   10/21/16 85.5 kg (188 lb 9.6 oz)   10/07/16 84 kg (185 lb 3.2 oz)            Intake/Output Summary (Last 24 hours) at 11/27/16 1523  Last data filed at 11/27/16 1400   Gross per 24 hour   Intake          3167.95 ml   Output           4465.1 ml   Net         -1297.15 ml       General appearance - alert, well appearing, and in no distress  Chest -clear to auscultation, no wheezes, rales or rhonchi, symmetric air entry  Heart - mild friction rub.   Extremities - 2+ lower extremity edema  Abd: soft. Non-tender, non-distended,  Neuro: alert and oriented  Skin: no rashes  Psych: normal affect    Medications:     Scheduled Meds:   albuterol-ipratropium 3 mL Nebulization Q6H [place]   amiodarone 200 mg Oral Q12H SCH   divalproex EC/DR tablet 500 mg Oral Q12H [place]   docusate sodium 100 mg Oral Daily   gabapentin 100 mg Oral Q12H [place]   insulin glargine 10 Units Subcutaneous QHS   pantoprazole 40 mg Oral BID AC   polyethylene glycol 17 g Oral Daily   pravastatin 40 mg Oral QPM   senna 8.6 mg Oral QHS    sodium bicarbonate 650 mg Oral BID       . sodium chloride 75 mL/hr at 11/27/16 0326   . heparin infusion - MAR calculator by aPTT 1,000 Units/hr (11/27/16 1254)   . niCARdipine Stopped (11/25/16 2119)         Labs:       Recent Labs  Lab 11/27/16  1205 11/26/16  2240 11/26/16  1157  11/25/16  1120 11/25/16  0454   Glucose 218* 134* 237* More results in Results Review 169* 230*   BUN 53* 75* 87* More results in Results Review 85* 75*   Creatinine 2.34* 3.25* 4.25* More results in Results Review 4.21* 3.79*   Sodium 137 135* 132* More results in Results Review 134* 133*   Potassium 4.3 3.8 4.2  More results in Results Review 6.4* 6.3*   Chloride 101 100 98 More results in Results Review 104 102   CO2 29.6 27.2 22.8 More results in Results Review 15.6* 19.4*   Magnesium  --   --   --   --  2.9*  --    AST (SGOT)  --   --   --   --   --  83*   ALT  --   --   --   --   --  78*   More results in Results Review = values in this interval not displayed.  Estimated Creatinine Clearance: 32.5 mL/min (A) (based on SCr of 2.34 mg/dL (H)).      Recent Labs  Lab 11/26/16  0302   CHOL 70*   TRIG 78   HDL 21*   LDL 33         Recent Labs  Lab 11/27/16  1205 11/27/16  0348 11/26/16  2240  11/23/16  0340  11/22/16  1540   WBC 14.4* 14.4* 13.9* More results in Results Review 15.2* More results in Results Review 15.0*   Hemoglobin 8.4* 8.4* 8.9* More results in Results Review 11.0* More results in Results Review 12.6*   Hematocrit 26.1* 26.1* 27.2* More results in Results Review 34.0* More results in Results Review 38.2*   PLT CT 191 177 183 More results in Results Review 199 More results in Results Review 221   PT INR 1.4*  --   --   --  1.3  --  1.3   More results in Results Review = values in this interval not displayed.    No results for input(s): TROPI, CK, CKMB in the last 8760 hours.      Recent Labs  Lab 11/26/16  0302   TSH 1.81       Radiology: all results from this admission  Xr Chest 2 Views    Result Date:  11/15/2016  Cardiomegaly with cephalization of pulmonary vascular flow. No focal infiltrate, pleural effusion, or pneumothorax. ReadingStation:[email or web address]    Xr Abdomen Ap    Result Date: 11/24/2016  Findings worrisome for partial proximal or mid small bowel obstruction. ReadingStation:[email or web address]    Ct Head Wo Contrast    Result Date: 11/24/2016  1. No evidence of intracranial hemorrhage. 2. Probable subacute infarct superior LEFT frontoparietal lobe gyrus. 3. If clinically indicated, MRI may be helpful in further evaluation. ReadingStation:[email or web address]    Xr Chest Ap Only    Result Date: 11/22/2016  No pneumothorax or significant acute pulmonary abnormalities. Small amount of subcutaneous emphysema in the anterior chest. ReadingStation:[email or web address]    Xr Chest Ap Portable    Result Date: 11/26/2016  Stable pulmonary vascular congestion and left basilar atelectasis or infiltrate. Probable small left pleural effusion is unchanged. ReadingStation:[id number]    Xr Chest Ap Portable    Result Date: 11/24/2016  Cardiomegaly with bilateral airspace disease or edema with small left effusion. No observed pneumothorax. Interval removal right IJ central line. ReadingStation:[email or web address]    X-ray Chest Ap Portable    Result Date: 11/23/2016  1. Pulmonary vascular congestion without overt failure 2. Left basilar opacity likely combination of atelectasis, consolidation and small effusion. 3. Catheter appliances as above ReadingStation:[email or web address]    Xr Abdomen Portable    Result Date: 11/25/2016  No acute abnormality. [place], DO

## 2016-11-27 NOTE — Progress Notes (Signed)
CT surgery postop day #5 hybrid ablation/#2 status post pericardial drainage  Comfortable in bed no complaints  Sinus rhythm  Blood pressure 120/71 pulse 71  JP drain pericardium 5 mL in the last shift  Subxiphoid incision clean and dry  Lungs clear  Being reinitiated on anticoagulation today, will leave one drain 1 more day per Dr. Krystal Eaton observe in ICU for this. May potentially be transferred to stepdown unit with drain. Otherwise doing well.

## 2016-11-27 NOTE — Plan of Care (Signed)
Problem: Nutrition  Goal: Nutritional intake is adequate  Outcome: Progressing  Patient taking soft foods without nausea, vomiting. Ate 95-100% of both breakfast and lunch. Active BS, passing flatus. Will continue to monitor.    Problem: Altered GI Function  Goal: Nutritional intake is adequate  Outcome: Progressing  Patient taking soft foods without nausea, vomiting. Ate 95-100% of both breakfast and lunch. Active BS, passing flatus. Will continue to monitor.

## 2016-11-27 NOTE — Progress Notes (Signed)
Cardiology Progress Note      Date Time: 11/27/16 10:49 AM  [name], [name] DOB:  [birth year]  Attending:  [name], MD    Subjective:   Still feels rough  No CP  No SOB        Physical Exam:     Tmax: Temp (24hrs), Avg:98.2 F (36.8 C), Min:98.1 F (36.7 C), Max:98.6 F (37 C)     BP: 122/71   Heart Rate: 71   SpO2: 99 %      Wt Readings from Last 3 Encounters:   11/27/16 88.1 kg (194 lb 3.6 oz)   10/21/16 85.5 kg (188 lb 9.6 oz)   10/07/16 84 kg (185 lb 3.2 oz)             Intake/Output Summary (Last 24 hours) at 11/27/16 1049  Last data filed at 11/27/16 0800   Gross per 24 hour   Intake          3318.45 ml   Output           4315.1 ml   Net          -996.65 ml       General:  Well nourished, no apparent distress.   Cardiovascular:RRR, ii/vi sys m   Pulmonary: few rhonchi  Extremities:warm, 1 + edema  Abdomen: Soft, non tender, non distended, good bowel sounds.  Skin:  No rashes  Neuro:  Alert and oriented.  4/5 in rt arm       Medications:     . sodium chloride 75 mL/hr at 11/27/16 0326   . niCARdipine Stopped (11/25/16 2119)     Current Facility-Administered Medications   Medication Dose Route Frequency   . albuterol-ipratropium  3 mL Nebulization Q6H SCH   . amiodarone  200 mg Oral Q12H [place]   . divalproex EC/DR tablet  500 mg Oral Q12H [place]   . docusate sodium  100 mg Oral Daily   . gabapentin  100 mg Oral Q12H [place]   . pantoprazole  40 mg Oral BID AC   . polyethylene glycol  17 g Oral Daily   . pravastatin  40 mg Oral QPM   . senna  8.6 mg Oral QHS   . sodium bicarbonate  650 mg Oral BID       Labs:       Recent Labs  Lab 11/26/16  2240 11/26/16  1157 11/26/16  0302  11/25/16  1120 11/25/16  0454  11/23/16  0340   Glucose 134* 237* 144* More results in Results Review 169* 230* More results in Results Review 120*   BUN 75* 87* 93* More results in Results Review 85* 75* More results in Results Review 31*   Creatinine 3.25* 4.25* 4.64* More results in Results Review 4.21* 3.79* More  results in Results Review 1.62*   Sodium 135* 132* 137 More results in Results Review 134* 133* More results in Results Review 142   Potassium 3.8 4.2 4.8 More results in Results Review 6.4* 6.3* More results in Results Review 4.2   Chloride 100 98 102 More results in Results Review 104 102 More results in Results Review 111*   CO2 27.2 22.8 23.8 More results in Results Review 15.6* 19.4* More results in Results Review 23.5   Magnesium  --   --   --   --  2.9*  --   --  2.3   AST (SGOT)  --   --   --   --   --  83*  --   --    ALT  --   --   --   --   --  78*  --   --    TSH  --   --  1.81  --   --   --   --   --    More results in Results Review = values in this interval not displayed.      Recent Labs  Lab 11/26/16  0302   Cholesterol 70*   Triglycerides 78   HDL 21*   LDL Calculated 33         Recent Labs  Lab 11/27/16  0348 11/26/16  2240 11/26/16  0302  11/23/16  0340  11/22/16  1540   WBC 14.4* 13.9* 12.6* More results in Results Review 15.2* More results in Results Review 15.0*   Hemoglobin 8.4* 8.9* 7.9* More results in Results Review 11.0* More results in Results Review 12.6*   Hematocrit 26.1* 27.2* 23.8* More results in Results Review 34.0* More results in Results Review 38.2*   PLT CT 177 183 163 More results in Results Review 199 More results in Results Review 221   PT INR  --   --   --   --  1.3  --  1.3   More results in Results Review = values in this interval not displayed.                Lab Results   Component Value Date    HGBA1CPERCNT 6.5 11/26/2016    HGBA1CPERCNT 6.5 11/15/2016          Recent Labs  Lab 11/23/16  0340 11/22/16  1540   PT INR 1.3 1.3       Telemetry: SR    Impression and Plan:   1.  A. Fib:  s/p convergent procedure.   On amio.  Heparin held due to bleeding  2.  Tamponade: s/p tap 4/19.  Drain in place with <30 cc output in 24 hours  3.  ARF  4.  HTN  5.  CVA: this admission.  Some rt sided weakness.   6.  DM  7.  HL: On statin     Will start iv heparin today  If no increase in  pigtail drainage then can pull tomorrow       Signed by: Olevia Perches Zair Borawski, MD, [facility], [facility].  Pager [phone].

## 2016-11-28 ENCOUNTER — Inpatient Hospital Stay: Payer: Medicare Other

## 2016-11-28 LAB — VH CELL COUNT BODY FLUID
Body Fluid Lymphocytes: 3 %
Body Fluid Monocytes: 7 %
Body Fluid Neutrophil Count: 90 %
Body Fluid RBC: 1199093 /mm3
Total NUCLEATED CELL COUNT: 5852 /mm3

## 2016-11-28 LAB — RENAL FUNCTION PANEL
Albumin: 2.6 gm/dL — ABNORMAL LOW (ref 3.5–5.0)
Anion Gap: 8.4 mMol/L (ref 7.0–18.0)
BUN / Creatinine Ratio: 20.3 Ratio (ref 10.0–30.0)
BUN: 36 mg/dL — ABNORMAL HIGH (ref 7–22)
CO2: 32.1 mMol/L — ABNORMAL HIGH (ref 20.0–30.0)
Calcium: 8.5 mg/dL (ref 8.5–10.5)
Chloride: 103 mMol/L (ref 98–110)
Creatinine: 1.77 mg/dL — ABNORMAL HIGH (ref 0.80–1.30)
EGFR: 39 mL/min/{1.73_m2} — ABNORMAL LOW (ref 60–150)
Glucose: 210 mg/dL — ABNORMAL HIGH (ref 71–99)
Osmolality Calc: 292 mOsm/kg (ref 275–300)
Phosphorus: 2.7 mg/dL (ref 2.3–4.7)
Potassium: 4.5 mMol/L (ref 3.5–5.3)
Sodium: 139 mMol/L (ref 136–147)

## 2016-11-28 LAB — CBC
Hematocrit: 26.8 % — ABNORMAL LOW (ref 39.0–52.5)
Hemoglobin: 8.7 gm/dL — ABNORMAL LOW (ref 13.0–17.5)
MCH: 30 pg (ref 28–35)
MCHC: 32 gm/dL (ref 32–36)
MCV: 92 fL (ref 80–100)
MPV: 7.1 fL (ref 6.0–10.0)
PLT CT: 191 10*3/uL (ref 130–440)
RBC: 2.92 10*6/uL — ABNORMAL LOW (ref 4.00–5.70)
RDW: 15.9 % — ABNORMAL HIGH (ref 11.0–14.0)
WBC: 14.9 10*3/uL — ABNORMAL HIGH (ref 4.0–11.0)

## 2016-11-28 LAB — PROTEIN, TOTAL: Protein, Total: 6.1 gm/dL (ref 6.0–8.3)

## 2016-11-28 LAB — CBC AND DIFFERENTIAL
Bands: 2 % (ref 0–10)
Basophils %: 0 % (ref 0.0–3.0)
Basophils Absolute: 0 10*3/uL (ref 0.0–0.3)
Eosinophils %: 0 % (ref 0.0–7.0)
Eosinophils Absolute: 0 10*3/uL (ref 0.0–0.8)
Hematocrit: 26.7 % — ABNORMAL LOW (ref 39.0–52.5)
Hemoglobin: 8.6 gm/dL — ABNORMAL LOW (ref 13.0–17.5)
Lymphocytes Absolute: 0.6 10*3/uL (ref 0.6–5.1)
Lymphocytes: 4 % — ABNORMAL LOW (ref 15.0–46.0)
MCH: 30 pg (ref 28–35)
MCHC: 32 gm/dL (ref 32–36)
MCV: 93 fL (ref 80–100)
MPV: 7.2 fL (ref 6.0–10.0)
Monocytes Absolute: 1.9 10*3/uL — ABNORMAL HIGH (ref 0.1–1.7)
Monocytes: 13 % (ref 3.0–15.0)
Neutrophils %: 81 % — ABNORMAL HIGH (ref 42.0–78.0)
Neutrophils Absolute: 12 10*3/uL — ABNORMAL HIGH (ref 1.7–8.6)
PLT CT: 180 10*3/uL (ref 130–440)
RBC: 2.88 10*6/uL — ABNORMAL LOW (ref 4.00–5.70)
RDW: 15.7 % — ABNORMAL HIGH (ref 11.0–14.0)
WBC: 14.4 10*3/uL — ABNORMAL HIGH (ref 4.0–11.0)

## 2016-11-28 LAB — VH PH BODY FLUID: Body Fluid pH: 7.6 pH

## 2016-11-28 LAB — VH GLUCOSE BODY FLUID: Body Fluid Glucose: 129 mg/dL

## 2016-11-28 LAB — VH LDH BODY FLUID: Body Fluid LDH: 630 U/L

## 2016-11-28 LAB — VH PROTEIN BODY FLUID: Body Fluid Protein: 3.2 gm/dL

## 2016-11-28 LAB — VH APTT( HEPARIN INFUSION THERAPY)
aPTT: 53 s — ABNORMAL HIGH (ref 24.0–34.0)
aPTT: 28.7 s (ref 24.0–34.0)

## 2016-11-28 LAB — VH DEXTROSE STICK GLUCOSE
Glucose POCT: 178 mg/dL — ABNORMAL HIGH (ref 71–99)
Glucose POCT: 168 mg/dL — ABNORMAL HIGH (ref 71–99)
Glucose POCT: 159 mg/dL — ABNORMAL HIGH (ref 71–99)
Glucose POCT: 239 mg/dL — ABNORMAL HIGH (ref 71–99)

## 2016-11-28 LAB — LACTATE DEHYDROGENASE: LDH: 505 U/L — ABNORMAL HIGH (ref 94–250)

## 2016-11-28 MED ORDER — AMIODARONE HCL IN DEXTROSE 150-4.21 MG/100ML-% IV SOLN
150.0000 mg | Freq: Once | INTRAVENOUS | Status: AC
Start: 2016-11-28 — End: 2016-11-28
  Administered 2016-11-28: 11:00:00 150 mg via INTRAVENOUS
  Filled 2016-11-28: qty 100

## 2016-11-28 MED ORDER — SODIUM CHLORIDE 0.9 % IV SOLN
2.0000 mg/min | INTRAVENOUS | Status: DC
Start: 2016-11-28 — End: 2016-11-30
  Administered 2016-11-28: 16:00:00 2 mg/min via INTRAVENOUS
  Administered 2016-11-29: 20:00:00 1 mg/min via INTRAVENOUS
  Filled 2016-11-28 (×3): qty 60

## 2016-11-28 MED ORDER — SODIUM CHLORIDE 0.9 % IJ SOLN
0.4000 mg | INTRAMUSCULAR | Status: DC | PRN
Start: 2016-11-28 — End: 2016-11-30

## 2016-11-28 MED ORDER — VH MORPHINE 1 MG/ML 50 ML
INTRAVENOUS | Status: DC
Start: 2016-11-28 — End: 2016-11-29
  Administered 2016-11-28 – 2016-11-29 (×2): 50 mg via INTRAVENOUS
  Filled 2016-11-28 (×2): qty 50

## 2016-11-28 MED ORDER — LANTUS SOLOSTAR 100 UNIT/ML SC SOPN
10.0000 [IU] | PEN_INJECTOR | Freq: Two times a day (BID) | SUBCUTANEOUS | Status: DC
Start: 2016-11-28 — End: 2016-12-09
  Administered 2016-11-28 – 2016-12-09 (×23): 10 [IU] via SUBCUTANEOUS
  Filled 2016-11-28: qty 3

## 2016-11-28 MED ORDER — SODIUM CHLORIDE 0.9 % IV SOLN
0.5000 mg/min | INTRAVENOUS | Status: DC
Start: 2016-11-28 — End: 2016-11-29
  Administered 2016-11-28: 12:00:00 0.501 mg/min via INTRAVENOUS
  Filled 2016-11-28: qty 18

## 2016-11-28 MED ORDER — SODIUM CHLORIDE 0.9 % IV SOLN
INTRAVENOUS | Status: DC | PRN
Start: 2016-11-28 — End: 2016-11-30

## 2016-11-28 NOTE — Procedures (Signed)
[name] is a 66 y.o. male patient.  Active Problems:    Atrial fibrillation    Chronic anticoagulation    Stroke    CKD (chronic kidney disease)    Former smoker, stopped smoking many years ago    Type 2 diabetes mellitus    HTN (hypertension)    HLD (hyperlipidemia)    Diabetic neuropathy    COPD (chronic obstructive pulmonary disease)    Bipolar disorder    BPH (benign prostatic hyperplasia)    Chronic atrial fibrillation    Stage 4 chronic kidney disease    Past Medical History:   Diagnosis Date   . Arrhythmia    . Arthritis    . Atrial fibrillation    . Atrial flutter    . Bipolar affective    . BPH (benign prostatic hyperplasia)    . Complication of anesthesia     resp. asessment   . Diabetes mellitus    . Diabetic neuropathy    . Gout    . Heart murmur    . HOH (hard of hearing)    . Hyperlipidemia    . Hypertension    . Paroxysmal atrial fibrillation    . Pulmonary hypertension    . Renal insufficiency    . Type 2 diabetes mellitus, controlled    . Wears glasses      Blood pressure (!) 142/91, pulse (!) 103, temperature 98 F (36.7 C), temperature source Oral, resp. rate 20, height 1.702 m (5\' 7" ), weight 88.1 kg (194 lb 3.6 oz), SpO2 98 %.    Chest Tube Insertion  Date/Time: 11/28/2016 2:30 PM  Performed by: Robyn Haber  Authorized by: Robyn Haber   Consent: Written consent obtained.  Risks and benefits: risks, benefits and alternatives were discussed  Consent given by: patient and power of attorney  Patient understanding: patient states understanding of the procedure being performed  Patient consent: the patient's understanding of the procedure matches consent given  Procedure consent: procedure consent matches procedure scheduled  Relevant documents: relevant documents present and verified  Test results: test results available and properly labeled  Site marked: the operative site was marked  Imaging studies: imaging studies available  Required items: required blood  products, implants, devices, and special equipment available  Patient identity confirmed: verbally with patient, arm band, provided demographic data and hospital-assigned identification number  Time out: Immediately prior to procedure a "time out" was called to verify the correct patient, procedure, equipment, support staff and site/side marked as required.  Indications: hemothorax    Sedation:  Patient sedated: no  Anesthesia: local infiltration    Anesthesia:  Local Anesthetic: lidocaine 1% without epinephrine  Anesthetic total: 5 mL  Preparation: skin prepped with ChloraPrep  Placement location: left lateral  Scalpel size: 11  Tube size: 16 French  Ultrasound guidance: yes  Tension pneumothorax heard: no  Tube connected to: suction  Drainage characteristics: bloody and serosanguinous  Drainage amount: 1250 ml  Suture material: 2-0 silk  Dressing: 4x4 sterile gauze  Post-insertion x-ray findings: tube in good position  Patient tolerance: Patient tolerated the procedure well with no immediate complications          Glennie Hawk [facility]  11/28/2016

## 2016-11-28 NOTE — Progress Notes (Signed)
Pericardial drain with no drainage in 24 hours.  Minimal fluid on CT scan chest    Pericardial drain pulled.

## 2016-11-28 NOTE — Progress Notes (Signed)
Pt. With recent CT to evaluate new large  Lt. 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 Appears non loculated and amenable to drainage by Thoracentesis. Dr Theotis Burrow discussed finding with Dr. Otilio Miu, recommend follow up scan in the future to evaluate aorta.

## 2016-11-28 NOTE — Progress Notes (Signed)
0800: Patient was very painful with elevated HR and SBP, morphine was given to treat pain, additionally, patient was retching and vomiting and given zofran. Patient was found to be in A-fib and RN was informed patient was in NSR overnight. Informed intensivist who then ordered a STAT  Xray and EKG. EKG revealed A-fib, xray revealed fluid in the L lung field. Intensivist ordered a CT. Intensivist ordered PCA d/t patient uncontrolled pain and amiodarone drip for A-fib.    1030: Transfer patient for CT.    1130: RN received a call from Radiology that patient had an abnormal finding and RN contacted intensivist and surgeon.     1500: JP drain removed by CV surgery, and chest tube inserted by intensivist. After chest tube inserted patient chest tube output was 1250.     1600: Labetalol was ordered to keep SBP less than 120. RN administered medication and SBP dropped to 70s - RN stopped medication drip.

## 2016-11-28 NOTE — Plan of Care (Signed)
Problem: Hemodynamic Status: Cardiac  Goal: Stable vital signs and fluid balance  Outcome: Progressing

## 2016-11-28 NOTE — Progress Notes (Signed)
Renal Progress Note    Follow up for:  ARF  CKD, stage 3    Subjective:     Pericardial drain pulled earlier today  Brisk urine volume continues and pt hemodynamically stable  Interval notes reviewed  CT placed for large effusion      Objective:     Vitals: BP 104/66   Pulse 94   Temp 97.8 F (36.6 C) (Oral)   Resp 15   Ht 1.702 m (5\' 7" )   Wt 88.1 kg (194 lb 3.6 oz)   SpO2 99%   BMI 30.42 kg/m       I/O:     Intake/Output Summary (Last 24 hours) at 11/28/16 1950  Last data filed at 11/28/16 1819   Gross per 24 hour   Intake          2873.35 ml   Output             5125 ml   Net         -2251.65 ml       Weights:  Wt Readings from Last 1 Encounters:   11/27/16 0553 88.1 kg (194 lb 3.6 oz)   11/26/16 0525 87.8 kg (193 lb 9 oz)   11/25/16 0540 86.3 kg (190 lb 4.8 oz)   11/24/16 0549 86.7 kg (191 lb 2.2 oz)   11/22/16 0608 85.6 kg (188 lb 11.4 oz)   11/15/16 1033 85.3 kg (188 lb)       Physical Exam:  General: No distress.  Lungs: CTA  Heart: RRR, no murmur, gallop, rub, or heave.  Abdomen: BS present, soft, NT/ND, no organomegaly.  Extremities: trace edema.  Access: N/A        Labs:  Labs reviewed.  Radiology results reviewed.  Pertinent findings below.  Recent Labs      11/28/16   0852  11/28/16   0116   WBC  14.9*  14.4*   Hemoglobin  8.7*  8.6*   Hematocrit  26.8*  26.7*   PLT CT  191  180     Recent Labs      11/28/16   1307  11/28/16   0116  11/27/16   1805  11/27/16   1205   PT   --    --    --   14.1*   PT INR   --    --    --   1.4*   aPTT  28.7  53.0*  48.8*  30.6     No results for input(s): TROPI, CK, CKMBINDEX in the last 72 hours.  No results for input(s): BNP in the last 72 hours.        Ct Chest Wo Contrast    Result Date: 11/28/2016  1.  DESCENDING THORACIC AORTIC DISSECTION. 2.  LARGE LEFT EFFUSION WITH COMPLETE LEFT LOWER LOBE COLLAPSE. 3.  CARDIOMEGALY WITH PERICARDIAL FLUID. 4.  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 ReadingStation:[email or web address]    Xr Chest  Ap Portable    Result Date: 11/28/2016  1.  NO PNEUMOTHORAX AFTER CHEST TUBE PLACEMENT. 2.  LEFT EFFUSION NEARLY COMPLETELY RESOLVED. ReadingStation:[email or web address]    Xr Chest Ap Portable    Result Date: 11/28/2016  Increase left effusion and worsening airspace disease left lower lung. ReadingStation:[email or web address]     Recent Labs      11/28/16   0116  11/27/16   1205  11/26/16   2240   Glucose  210*  218*  134*  Sodium  139  137  135*   Potassium  4.5  4.3  3.8   Chloride  103  101  100   CO2  32.1*  29.6  27.2   BUN  36*  53*  75*   Creatinine  1.77*  2.34*  3.25*   EGFR  39*  28*  19*   Calcium  8.5  8.4*  8.3*     Recent Labs      11/28/16   0116  11/27/16   1205  11/26/16   2240   Phosphorus  2.7  3.3  4.8*     Recent Labs      11/28/16   1627  11/28/16   0116  11/27/16   1205  11/26/16   2240   Albumin   --   2.6*  2.7*  2.9*   Protein, Total  6.1   --    --    --     No results for input(s): SGUR, LABPH, PROTEINUR, GLUUA, KETONESUA, BILIUA, BLOODUA, NITRITEUA, UROBILIUA, LEUA, NITRITEUA, WBCUA, RBCUA, BACTERIAUA in the last 72 hours.    Invalid input(s):  AMORPHOUSUA               Assessment:     1. AKI - pre-renal ischemia +/- ATN resulting from pericardial effusion; peak creatinine 4.64 this morning, now improving steadily with brisk urine output  2. Stage III chronic kidney disease-with apparent baseline creatinine 1.6-2.1 mg/dL; most recent creatinine value on outpatient basis had been 2.0 mg/dL on 11/15/16. Suspect underlying diabetic/hypertensive kidney disease.  3. Hyperkalemia-associated with reduced renal function and ACE inhibitor use; also component of hyperglycemia  4. Metabolic acidosis-associated with reduced renal function, resolved  5. Ileus, improved  6. Status post hybrid left atrial ablation (convergent procedure) for atrial fibrillation  7. Hypertension  8. Subacute CVA of left frontoparietal lobes  9. Type 2 diabetes mellitus      Plan:      sCr continues to improve steadily   Maintenance IVFs as  ordered for now with additional Prn boluses to maintain goal SBP of 120-160   Continue with daily chemistries for now      Orders and Medications reviewed in Epic.    Blanchard Mane, Pager [phone]  7:50 PM  11/28/2016  [facility]  Dr. Hurley Cisco, Dr. Josephina Shih, Dr. Fernande Boyden  Office: [phone]

## 2016-11-28 NOTE — Progress Notes (Addendum)
[place]   Daily Progress Note      [name], [name]  Date/Time: 11/28/16 8:08 AM  Attending Physician: [name], MD  Location/Room: 3548/3548-A     Interval Events:   Pericardial drain occluded  Chest x-ray with large left pleural effusion-loculated, no change in cardiac silhouette  Fluid balance is -1 L, creatinine further improvement  Hemoglobin/hematocrit at 1 AM unchanged    Subjective:   Complains of increased chest pain with intermittent nausea and 1 episode of emesis, no increased shortness of breath    Problem List:     Active Hospital Problems    Diagnosis   . Chronic anticoagulation   . Stroke   . CKD (chronic kidney disease)   . Former smoker, stopped smoking many years ago   . Type 2 diabetes mellitus   . HTN (hypertension)   . HLD (hyperlipidemia)   . Diabetic neuropathy   . COPD (chronic obstructive pulmonary disease)   . Bipolar disorder   . BPH (benign prostatic hyperplasia)   . Atrial fibrillation   . Chronic atrial fibrillation   . 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  The procedure itself was uncomplicated.  The patient was  extubated without difficulty.  On arrival to the intensive care unit, a 500 cc bolus of fluid was drained from the mediastinal via the pericardial drain.  It is unclear whether or not the collection of blood was related pooling during the ablation procedure or whether it represented active ongoing bleeding.  There was no evidence of pericardial tamponade via  abnormal hemodynamics and there was no evidence of an enlarging cardiac silhouette. Initially, the patient did well but then began to experience increasing dyspnea and worsening renal function. A repeat echocardiogram revealed a large circumferential pericardial effusion. He underwent placement of a pericardial pigtail catheter. He developed a new right hemiplegia right upper greater than right lower and was sent for a CT scan of the brain on 11/24/2016 which showed a likely subacute infarct of the superior left frontoparietal lobe gyrus assistant with an acute CVA syndrome. Yesterday, the patient was placed on systemic anticoagulation prior to planned removal of the pericardial catheter today. Overnight he complained of nausea, occasional emesis, and was noted to have decreased drainage via the pericardial catheter. A repeat EKG does not documents this changes suggestive of significant pericarditis. A repeat chest x-ray documents a marked increase in left effusion-suspect hemothorax, cardiac silhouette is unchanged. The left pericardial drain appears to be obstructed. Creatinine continues to improve  (acute on chronic renal failure, baseline creatinine appears to be approximately 1.6). No hemogram this morning. The patient's rhythm has changed atrial fibrillation with a controlled ventricular response  Plan:     Cardiovascular: Rhythm has changed to atrial fibrillation with a controlled ventricular response, pericardial catheter obstructed-likely clotted, large left effusion- likely hemothorax. Intravenous heparin discontinued, will discuss further intervention with attending and cardiology consult     Pulmonary: Remote history of tobacco abuse and suggestion of severe obstructive airways disease based on preop pulmonary spirometry. Pulmonary function tests are not available for review . Continue current pulmonary  toilet. Chest x-ray as discussed above, may need left chest tube    Gastrointestinal: Episode of nausea  and single episode of emesis and apparently resolved with Zofran, will observe closely, make nothing by mouth for now given likely need of intervention     Infectious Disease: Afebrile, mild leukocytosis without left shift, withhold antibiotics    Neurologic: Patient with possible extension of previous CVA involving the superior left frontoparietal lobe gyrus with ?reversibly increased neuro deficits secondary to hypoperfusion. Right upper extremity neuro deficit is resolved, continue PT/OT. Will change to morphine via PCA for complaints of chest pain. Despite a history of nausea/vomiting on exposure to codeine, the patient experienced no GI symptoms after receiving intravenous morphine.    Renal: Fluid balance is negative approximately 1 L, creatinine improved-follow-up, given likely need for intervention and probable acute blood loss, we'll increase IV fluids     Hem/Onc: Hemoglobin/hematocrit, platelets stable on last draw-1 AM. Repeat stat     Endocrine: Serial serum glucoses remain elevated , increase Lantus further    ICU Management:  Sedation: None needed.  Nutrition: Nothing by mouth  GI Prophylaxis: PPI bid  VTE Prophylaxis: SCDs/teds  Foley Catheter: continue    Disposition: Keep in ICU  Code Status: Full Code    Physical Exam:   Vitals: BP 147/86   Pulse 82   Temp 98.2 F (36.8 C) (Oral)   Resp 16   Ht 1.702 m (5\' 7" )   Wt 88.1 kg (194 lb 3.6 oz)   SpO2 98%   BMI 30.42 kg/m   I/O:     Intake/Output Summary (Last 24 hours) at 11/28/16 7846  Last data filed at 11/28/16 0444   Gross per 24 hour   Intake           2807.3 ml   Output             3103 ml   Net           -295.7 ml     Vent settings:      General Appearance:  Well-developed, well-nourished 66 year old white male who is alert and oriented 3 in no apparent respiratory distress     Mental status: Alert, oriented 3     Neuro:Muscle strength 5/5 upper and lower extremities bilaterally, no sensory deficit appreciated     HEENT:NCAT     Neck:  No cervical adenopathy     Lungs:Breath sounds are Decreased on left, few central rhonchi, no rales, no wheezing     Cardiac: Irregularly irregular rate and rhythm, S1, S2, no S3, no S4, no murmurs, rubs or gallops     Abdomen: Bowel sounds are normal, abdomen is soft, distended, nontender with no gross hepatosplenomegaly     Extremities:No cyanosis, clubbing or edema     Skin: No acute skin breakdown appreciated     Review of Systems:   Pertinent items are noted in HPI. All other systems are negative.    Labs:   Interval Labs in the [facility] record were reviewed including the following:  CBC:     Recent Labs  Lab 11/28/16  0116 11/27/16  1805 11/27/16  1205   WBC 14.4* 13.6* 14.4*   RBC 2.88* 2.91* 2.83*   Hemoglobin 8.6* 8.5* 8.4*   Hematocrit 26.7* 26.7* 26.1*   MCV 93 92 92   PLT CT 180 192 191     Chemistry:   Recent Labs  Lab 11/28/16  0116 11/27/16  1205 11/26/16  2240 11/26/16  1157 11/26/16  0302  11/25/16  1120  11/23/16  0340   Sodium 139 137 135* 132* 137 More results in Results Review 134* More results in Results Review 142   Potassium 4.5 4.3 3.8 4.2 4.8 More results in Results Review 6.4* More results in Results Review 4.2   Chloride 103 101 100 98 102 More results in Results Review 104 More results in Results Review 111*   CO2 32.1* 29.6 27.2 22.8 23.8 More results in Results Review 15.6* More results in Results Review 23.5   BUN 36* 53* 75* 87* 93* More results in Results Review 85* More results in Results Review 31*   Creatinine 1.77* 2.34* 3.25* 4.25* 4.64* More results in Results Review 4.21* More results in Results Review 1.62*   Glucose 210* 218* 134* 237* 144* More results in Results Review 169* More results in Results Review 120*   Calcium 8.5 8.4* 8.3* 7.9* 8.0* More results in Results Review 9.4 More results in Results Review 8.7   Magnesium  --   --   --   --   --   --  2.9*  --  2.3   More results in Results Review = values in this interval not displayed.  LFTs:     Recent Labs  Lab  11/28/16  0116 11/27/16  1205 11/26/16  2240  11/25/16  0454   ALT  --   --   --   --  78*   AST (SGOT)  --   --   --   --  83*   Bilirubin, Total  --   --   --   --  1.4*   Albumin 2.6* 2.7* 2.9* More results in Results Review 3.3*   Alkaline Phosphatase  --   --   --   --  66   More results in Results Review = values in this interval not displayed.  Cardiac Enzymes:     Coags:     Recent Labs  Lab 11/27/16  1205 11/23/16  0340 11/22/16  1540   PT INR 1.4* 1.3 1.3   PT 14.1* 13.2* 13.1*       Radiology / Imaging:     Imaging personally reviewed by me, including CXR: Chest x-ray as above.    Attestation & Billing:   Patient's condition and plan discussed with: patient, family and consultants    This patient has a high probability of sudden clinically significant deterioration which requires the highest level of physician preparedness to intervene urgently. I managed/supervised life or organ supporting interventions that required frequent physician assessments. I devoted my full attention in the ICU to the direct care of this patient for this period of time.    Any critical care time was performed today and is exclusive of teaching, billable procedures, and not overlapping with any other providers.    Billing: Critical care time: 40 minutes.    Signed by: Robyn Haber, MD   OP:[id number], Delphia Grates, MD

## 2016-11-28 NOTE — Progress Notes (Signed)
Progress Note Neurology   11/28/2016     Chief Complaint:   Transient weakness    Interval History:   [name] FYB:[id number],DPO:[id number] is a 66 y.o. male, with history of chronic atrial fibrillation, type 2  diabetes mellitus, hypertension, COPD, chronic renal failure,  who was admitted on the 16th of April for cardioversion.  The  patient reportedly has reduced ejection fracture and had failed  in prior defibrillation attempts.  He underwent an  intraoperative hybrid left atrial ablation procedure with  bipolar radiofrequency (convergence procedure on the 16th).  The  patient had been previously on anticoagulation with Xarelto.  He  was noted to have acute weakness in his right arm and leg and  altered speech, for which he was referred for a stat MRI scan of  the head at 2100 hours on April 18.  The patient was noted to be hypotensive.  He was  profoundly weak in his right upper extremity of 1/5 to 2/5 and  2/5 to 3/5 in his right arm along with being lethargic.  He  required placement of a catheter into the pericardial space with  drainage of serosanguineous fluid.  His clinical course had been  complicated by progressive renal failure.  Subsequent to the  pericardial fluid drainage, the patient was noted to have  significant improvement of the right hemiparesis and improved  alertness.     Patient denies any recurrent weakness. He is fatigued and generally weak.     PMHx:   Past Medical History:   Diagnosis Date   . Arrhythmia    . Arthritis    . Atrial fibrillation    . Atrial flutter    . Bipolar affective    . BPH (benign prostatic hyperplasia)    . Complication of anesthesia     resp. asessment   . Diabetes mellitus    . Diabetic neuropathy    . Gout    . Heart murmur    . HOH (hard of hearing)    . Hyperlipidemia    . Hypertension    . Paroxysmal atrial fibrillation    . Pulmonary hypertension    . Renal insufficiency    . Type 2 diabetes mellitus, controlled    . Wears glasses        FHx:   Family  History   Problem Relation Age of Onset   . Hypertension Mother    . Diabetes Mother    . Heart disease Mother    . Hypertension Sister    . Diabetes Sister    . Hyperlipidemia Sister    . Hypertension Brother    . Heart disease Brother    . Hyperlipidemia Brother    . Hypertension Brother    . Diabetes Brother    . Hyperlipidemia Brother    . Kidney disease Brother    . Hypertension Daughter      SHx:   Social History     Social History   . Marital status: Widowed     Spouse name: N/A   . Number of children: N/A   . Years of education: N/A     Social History Main Topics   . Smoking status: Former Smoker     Packs/day: 0.30     Years: 40.00     Types: Cigarettes     Quit date: 1995   . Smokeless tobacco: Never Used      Comment: 1 pack would last a week   . Alcohol use  No   . Drug use: No   . Sexual activity: Not on file     Other Topics Concern   . Not on file     Social History Narrative   . No narrative on file       ROS:   No headache, No chest pain, No abdominal pain - No Nausea, No new asymmetric  weakness tingling or numbness. No speech change.No visual change. No swallowing change. No Cough - SOB. No seizure activity    Objective Data: (reviewed)   Patient Vitals for the past 4 hrs:   BP Temp Pulse Resp   11/28/16 0800 - 98.1 F (36.7 C) - -   11/28/16 0600 147/86 - 82 16       Intake/Output Summary (Last 24 hours) at 11/28/16 0932  Last data filed at 11/28/16 0825   Gross per 24 hour   Intake             3153 ml   Output             3528 ml   Net             -375 ml     Scheduled Meds:  Current Facility-Administered Medications   Medication Dose Route Frequency   . albuterol-ipratropium  3 mL Nebulization Q6H [facility]   . amiodarone  200 mg Oral Q12H [place]   . divalproex EC/DR tablet  500 mg Oral Q12H [place]   . docusate sodium  100 mg Oral Daily   . gabapentin  100 mg Oral Q12H [place]   . insulin glargine  10 Units Subcutaneous Q12H [place]   . pantoprazole  40 mg Oral BID AC   . polyethylene glycol  17 g Oral Daily   .  pravastatin  40 mg Oral QPM   . senna  8.6 mg Oral QHS   . sodium bicarbonate  650 mg Oral BID     Continuous Infusions:  . sodium chloride 125 mL/hr at 11/28/16 0814   . morphine     . niCARdipine Stopped (11/25/16 2119)     PRN Meds:.sodium chloride, albuterol, dextrose, glucagon (rDNA), insulin aspart, naloxone, ondansetron **OR** ondansetron, promethazine **OR** promethazine **OR** promethazine, sodium chloride, traMADol    Single System Neurological Exam   Awake, Alert,  Lethargic.   Normal affect  Speech intact.Recent and remote memory intact   Cranial Nerve Exam:  CN 1 Olfactory nerve normal function  CN 2 Normal vision without visual field defects   CN 3,4,6 normal extraocular movement without diplopia  No nystagmus  PEERLA  CN 5 Normal facial sensation   CN 7 Normal facial expression without droop  CN 8 Normal Auditory sense both ears  CN 9 Normal Palate movement  CN 11 Shoulder shrug normal   CN 12 Tongue protrudes in midline    Motor Normal strength, extremities upper and lower, fair grip. Shoulder shrugs intact.  Deep tendon reflex intact     Sensory  Examination intact to light touch    Coordination normal on finger to nose   Gait  Not assessed.      Data Review   Past 24 Labs:  (reviewed)  Results     Procedure Component Value Units Date/Time    Hemogram [[id number]]  (Abnormal) Collected:  11/28/16 0852    Specimen:  Blood from Blood Updated:  11/28/16 0919     WBC 14.9 (H) K/cmm      RBC 2.92 (L) M/cmm  Hemoglobin 8.7 (L) gm/dL      Hematocrit 26.8 (L) %      MCV 92 fL      MCH 30 pg      MCHC 32 gm/dL      RDW 15.9 (H) %      MPV 7.1 fL     Dextrose Stick Glucose [[id number]]  (Abnormal) Collected:  11/28/16 0853    Specimen:  Blood Updated:  11/28/16 0909     Glucose, POCT 178 (H) mg/dL     CBC and differential [[id number]]  (Abnormal) Collected:  11/28/16 0116    Specimen:  Blood from Blood Updated:  11/28/16 0228     WBC 14.4 (H) K/cmm      RBC 2.88 (L) M/cmm      Hemoglobin 8.6 (L) gm/dL       Hematocrit 26.7 (L) %      MCV 93 fL      MCH 30 pg      MCHC 32 gm/dL      RDW 15.7 (H) %      PLT CT 180 K/cmm      MPV 7.2 fL      NEUTROPHIL % 81.0 (H) %      Lymphocytes 4.0 (L) %      Monocytes 13.0 %      Eosinophils % 0.0 %      Basophils % 0.0 %      Bands 2 %      Neutrophils Absolute 12.0 (H) K/cmm      Lymphocytes Absolute 0.6 K/cmm      Monocytes Absolute 1.9 (H) K/cmm      Eosinophils Absolute 0.0 K/cmm      BASO Absolute 0.0 K/cmm      RBC Morphology RBC Morphology Reviewed     Anisocytosis 1+     Polychromasia 1+     Poikilocytosis 1+     Target Cells 1+    Narrative:       Manual differential performed    Renal function panel [[id number]]  (Abnormal) Collected:  11/28/16 0116    Specimen:  Plasma Updated:  11/28/16 0145     Sodium 139 mMol/L      Potassium 4.5 mMol/L      Chloride 103 mMol/L      CO2 32.1 (H) mMol/L      Calcium 8.5 mg/dL      Glucose 210 (H) mg/dL      Creatinine 1.77 (H) mg/dL      BUN 36 (H) mg/dL      Albumin 2.6 (L) gm/dL      Phosphorus 2.7 mg/dL      Anion Gap 8.4 mMol/L      BUN/Creatinine Ratio 20.3 Ratio      EGFR 39 (L) mL/min/1.22m2      Osmolality Calc 292 mOsm/kg     APTT( Heparin Infusion Therapy) [[id number]]  (Abnormal) Collected:  11/28/16 0116    Specimen:  Blood Updated:  11/28/16 0134     aPTT 53.0 (H) sec     Dextrose Stick Glucose [[id number]]  (Abnormal) Collected:  11/27/16 2233    Specimen:  Blood Updated:  11/27/16 2250     Glucose, POCT 251 (H) mg/dL     APTT( Heparin Infusion Therapy) [[id number]]  (Abnormal) Collected:  11/27/16 1805    Specimen:  Blood Updated:  11/27/16 1827     aPTT 48.8 (H) sec     Hemogram [[id number]]  (Abnormal) Collected:  11/27/16  1805    Specimen:  Blood from Blood Updated:  11/27/16 1817     WBC 13.6 (H) K/cmm      RBC 2.91 (L) M/cmm      Hemoglobin 8.5 (L) gm/dL      Hematocrit 26.7 (L) %      MCV 92 fL      MCH 29 pg      MCHC 32 gm/dL      RDW 15.7 (H) %      PLT CT 192 K/cmm      MPV 7.2 fL     Dextrose Stick Glucose  [[id number]]  (Abnormal) Collected:  11/27/16 1644    Specimen:  Blood Updated:  11/27/16 1700     Glucose, POCT 203 (H) mg/dL     Baseline APTT [[id number]] Collected:  11/27/16 1205    Specimen:  Blood Updated:  11/27/16 1254     aPTT 30.6 sec     Baseline Prothrombin time/INR [[id number]]  (Abnormal) Collected:  11/27/16 1205    Specimen:  Blood Updated:  11/27/16 1254     PT 14.1 (H) sec      PT INR 1.4 (H)    Renal function panel [[id number]]  (Abnormal) Collected:  11/27/16 1205    Specimen:  Plasma Updated:  11/27/16 1252     Sodium 137 mMol/L      Potassium 4.3 mMol/L      Chloride 101 mMol/L      CO2 29.6 mMol/L      Calcium 8.4 (L) mg/dL      Glucose 218 (H) mg/dL      Creatinine 2.34 (H) mg/dL      BUN 53 (H) mg/dL      Albumin 2.7 (L) gm/dL      Phosphorus 3.3 mg/dL      Anion Gap 10.7 mMol/L      BUN/Creatinine Ratio 22.6 Ratio      EGFR 28 (L) mL/min/1.61m2      Osmolality Calc 295 mOsm/kg     Baseline CBC without differential [[id number]]  (Abnormal) Collected:  11/27/16 1205    Specimen:  Blood from Blood Updated:  11/27/16 1235     WBC 14.4 (H) K/cmm      RBC 2.83 (L) M/cmm      Hemoglobin 8.4 (L) gm/dL      Hematocrit 26.1 (L) %      MCV 92 fL      MCH 30 pg      MCHC 32 gm/dL      RDW 15.5 (H) %      PLT CT 191 K/cmm      MPV 7.3 fL     Dextrose Stick Glucose [[id number]]  (Abnormal) Collected:  11/27/16 1149    Specimen:  Blood Updated:  11/27/16 1205     Glucose, POCT 228 (H) mg/dL            Radiology Reports: (reviewed)  XR Chest AP Portable   Final Result   Increase left effusion and worsening airspace disease left lower lung.      ReadingStation:[email or web address]      Echocardiogram Adult Limited WO Clr/Dopp Waveform   Final Result      XR Chest AP Portable   Final Result   Stable pulmonary vascular congestion and left basilar atelectasis or infiltrate.   Probable small left pleural effusion is unchanged.                  ReadingStation:[id number]  Pericardiocentesis   Final Result       Echocardiogram Adult Limited WO Clr/Dopp Waveform   Final Result      Echocardiogram Adult Limited [place]   Final Result      XR Abdomen Portable   Final Result   No acute abnormality.      ReadingStation:[email or web address]      CT Head WO Contrast   Final Result   1. No evidence of intracranial hemorrhage.      2. Probable subacute infarct superior LEFT frontoparietal lobe gyrus.      3. If clinically indicated, MRI may be helpful in further evaluation.      ReadingStation:[email or web address]      XR Abdomen AP   Final Result   Findings worrisome for partial proximal or mid small bowel obstruction.      ReadingStation:[email or web address]      XR Chest AP Portable   Final Result   Cardiomegaly with bilateral airspace disease or edema with small left effusion. No observed pneumothorax. Interval removal right IJ central line.      ReadingStation:[email or web address]      X-ray chest AP portable   Final Result      1. Pulmonary vascular congestion without overt failure   2. Left basilar opacity likely combination of atelectasis, consolidation and small effusion.   3. Catheter appliances as above      ReadingStation:[email or web address]      XR Chest AP Only   Final Result   No pneumothorax or significant acute pulmonary abnormalities.      Small amount of subcutaneous emphysema in the anterior chest.      ReadingStation:[email or web address]      FLUORO-NO CHARGE (HYBRID ROOM ONLY)   Final Result      Echocardiogram Adult Limited WO Clr/Dopp Waveform    (Results Pending)   CT Chest WO Contrast    (Results Pending)       Additional History obtained from:  Case Discussed with:  Imaging Study Films Reviewed:  Labs Ordered:     Diagnostic Testing Ordered:            Problem List   - Stable  Active Problems:    Atrial fibrillation    Chronic anticoagulation    Stroke    CKD (chronic kidney disease)    Former smoker, stopped smoking many years ago    Type 2 diabetes mellitus    HTN (hypertension)    HLD (hyperlipidemia)    Diabetic neuropathy    COPD (chronic obstructive pulmonary  disease)    Bipolar disorder    BPH (benign prostatic hyperplasia)    Chronic atrial fibrillation    Stage 4 chronic kidney disease    Transient weakness in the setting of hypotension.   Suspect TIA vs stroke.        Plan:  Below is a summary of counseling and/or coordination of care, which consumed the following:   >12.5 min of 25 min unit/floor time  1. TIA vs stroke- MRIs ordered previously, but canceled.  MRI brain and MRA neck recommended when primary team feels is appropriate.  2. Maintain sys BP 120-160 and avoid hypotension.   3. Anticoagulation when appropriate  4. Continue treating medical issues- ARF, CKD.     Risk Assessment:   MODERATE on basis of:  ADMINISTERING IV FLUIDS WITH ADDITIVES    Evelena Peat, MD  11/28/2016 9:32 AM

## 2016-11-28 NOTE — Plan of Care (Signed)
Problem: Compromised Tissue integrity  Goal: Damaged tissue is healing and protected  Outcome: Progressing     Problem: Moderate/High Fall Risk Score >5  Goal: Patient will remain free of falls  Outcome: Progressing     Problem: Hemodynamic Status: Cardiac  Goal: Stable vital signs and fluid balance  Outcome: Progressing

## 2016-11-28 NOTE — Progress Notes (Signed)
Cardiology Progress Note      Date Time: 11/28/16 9:58 AM  [name], [name] DOB:  1951-02-24  Attending:  [name], MD    Subjective:   Heparin started yesterday  Then had left chest pain  CXR with new effusion  Pt with significant pain  Worse when lays down  Orthopneic as well.       Physical Exam:     Tmax: Temp (24hrs), Avg:98.4 F (36.9 C), Min:98.1 F (36.7 C), Max:99.5 F (37.5 C)     BP: 147/86   Heart Rate: 82   SpO2: 98 %      Wt Readings from Last 3 Encounters:   11/27/16 88.1 kg (194 lb 3.6 oz)   10/21/16 85.5 kg (188 lb 9.6 oz)   10/07/16 84 kg (185 lb 3.2 oz)               Intake/Output Summary (Last 24 hours) at 11/28/16 0958  Last data filed at 11/28/16 0825   Gross per 24 hour   Intake             3153 ml   Output             3528 ml   Net             -375 ml       General:  Well nourished, moderate distress.   Cardiovascular:RRR, ii/vi sys m   Pulmonary: few rhonchi  Extremities:warm, 1 + edema  Abdomen: Soft, non tender, non distended, good bowel sounds.  Skin:  No rashes  Neuro:  Alert and oriented.  4/5 in rt arm       Medications:     . sodium chloride 125 mL/hr at 11/28/16 0814   . morphine     . niCARdipine Stopped (11/25/16 2119)     Current Facility-Administered Medications   Medication Dose Route Frequency   . albuterol-ipratropium  3 mL Nebulization Q6H [facility]   . amiodarone  200 mg Oral Q12H [place]   . divalproex EC/DR tablet  500 mg Oral Q12H [place]   . docusate sodium  100 mg Oral Daily   . gabapentin  100 mg Oral Q12H [place]   . insulin glargine  10 Units Subcutaneous Q12H [place]   . pantoprazole  40 mg Oral BID AC   . polyethylene glycol  17 g Oral Daily   . pravastatin  40 mg Oral QPM   . senna  8.6 mg Oral QHS   . sodium bicarbonate  650 mg Oral BID       Labs:       Recent Labs  Lab 11/28/16  0116 11/27/16  1205 11/26/16  2240  11/26/16  0302  11/25/16  1120 11/25/16  0454  11/23/16  0340   Glucose 210* 218* 134* More results in Results Review 144* More results in  Results Review 169* 230* More results in Results Review 120*   BUN 36* 53* 75* More results in Results Review 93* More results in Results Review 85* 75* More results in Results Review 31*   Creatinine 1.77* 2.34* 3.25* More results in Results Review 4.64* More results in Results Review 4.21* 3.79* More results in Results Review 1.62*   Sodium 139 137 135* More results in Results Review 137 More results in Results Review 134* 133* More results in Results Review 142   Potassium 4.5 4.3 3.8 More results in Results Review 4.8 More results in Results Review 6.4* 6.3* More  results in Results Review 4.2   Chloride 103 101 100 More results in Results Review 102 More results in Results Review 104 102 More results in Results Review 111*   CO2 32.1* 29.6 27.2 More results in Results Review 23.8 More results in Results Review 15.6* 19.4* More results in Results Review 23.5   Magnesium  --   --   --   --   --   --  2.9*  --   --  2.3   AST (SGOT)  --   --   --   --   --   --   --  83*  --   --    ALT  --   --   --   --   --   --   --  78*  --   --    TSH  --   --   --   --  1.81  --   --   --   --   --    More results in Results Review = values in this interval not displayed.      Recent Labs  Lab 11/26/16  0302   Cholesterol 70*   Triglycerides 78   HDL 21*   LDL Calculated 33         Recent Labs  Lab 11/28/16  0852 11/28/16  0116 11/27/16  1805 11/27/16  1205  11/23/16  0340  11/22/16  1540   WBC 14.9* 14.4* 13.6* 14.4* More results in Results Review 15.2* More results in Results Review 15.0*   Hemoglobin 8.7* 8.6* 8.5* 8.4* More results in Results Review 11.0* More results in Results Review 12.6*   Hematocrit 26.8* 26.7* 26.7* 26.1* More results in Results Review 34.0* More results in Results Review 38.2*   PLT CT 191 180 192 191 More results in Results Review 199 More results in Results Review 221   PT INR  --   --   --  1.4*  --  1.3  --  1.3   More results in Results Review = values in this interval not displayed.                 Lab Results   Component Value Date    HGBA1CPERCNT 6.5 11/26/2016    HGBA1CPERCNT 6.5 11/15/2016          Recent Labs  Lab 11/27/16  1205 11/23/16  0340 11/22/16  1540   PT INR 1.4* 1.3 1.3       Telemetry: SR    Impression and Plan:   1.  A. Fib:  s/p convergent procedure.   On amio.  Heparin held due to bleeding.  Went back into a. Fib .  Will resume IV amio.   2.  Tamponade: s/p tap 4/19.  Drain in place with <30 cc output in 24 hours  3.  ARF: may have been from tamponade   4.  HTN  5.  CVA: this admission.  Some rt sided weakness.   6.  DM  7.  HL: On statin   8.  Left effusion:  Started after heparin.  Worrisome for hemathorax    CT chest (if no CT done then will get a limited Echo)   Likely drain left effusion    Heparin stopped    If minimal pericardial effusion the can pull drain    Resume IV amio      Signed by: Olevia Perches Chase Arnall, MD, [facility],  [facility] Card[facility] and Vascular Medicine.  Pager [phone].

## 2016-11-28 NOTE — Progress Notes (Signed)
Cardiology Progress Note      Date Time: 11/28/16 5:47 PM  [name], [name]      Assessment:     Active Hospital Problems    Diagnosis   . Chronic anticoagulation   . Stroke   . CKD (chronic kidney disease)   . Former smoker, stopped smoking many years ago   . Type 2 diabetes mellitus   . HTN (hypertension)   . HLD (hyperlipidemia)   . Diabetic neuropathy   . COPD (chronic obstructive pulmonary disease)   . Bipolar disorder   . BPH (benign prostatic hyperplasia)   . Atrial fibrillation   . Chronic atrial fibrillation   . Stage 4 chronic kidney disease             Plan:     1. Appreciate the input of Dr. Tarri Fuller, Call and [facility]. He is back in atrial fibrillation today. Would plan on delaying further cardioversion until a stable course of anticoagulation can be instituted.   2. Renal function has improved.   3. Mentation is slightly less tonight than yesterday.   4. Reviewed echocardiogram, no re-accumulation of pericardial fluid.       Subjective:   Chest pain has improved.     Physical Exam:     Temp:  [97.8 F (36.6 C)-99.5 F (37.5 C)] 97.8 F (36.6 C)  Heart Rate:  [75-134] 80  Resp Rate:  [16-36] 16  BP: (87-195)/(56-131) 87/56    Wt Readings from Last 3 Encounters:   11/27/16 88.1 kg (194 lb 3.6 oz)   10/21/16 85.5 kg (188 lb 9.6 oz)   10/07/16 84 kg (185 lb 3.2 oz)            Intake/Output Summary (Last 24 hours) at 11/28/16 1747  Last data filed at 11/28/16 1723   Gross per 24 hour   Intake           3142.4 ml   Output             5525 ml   Net          -2382.6 ml       General appearance - alert, well appearing, and in no distress  Chest -clear to auscultation, no wheezes, rales or rhonchi, symmetric air entry  Heart - normal rate, regular rhythm, normal S1, S2, no murmurs, rubs, clicks or gallops  Extremities - peripheral pulses normal, no pedal edema, no clubbing or cyanosis  Abd: soft. Non-tender, non-distended,  Neuro: alert and oriented  Skin: no rashes  Psych: normal  affect    Medications:     Scheduled Meds:   albuterol-ipratropium 3 mL Nebulization Q6H [place]   amiodarone 200 mg Oral Q12H SCH   divalproex EC/DR tablet 500 mg Oral Q12H SCH   docusate sodium 100 mg Oral Daily   gabapentin 100 mg Oral Q12H [place]   insulin glargine 10 Units Subcutaneous Q12H SCH   pantoprazole 40 mg Oral BID AC   polyethylene glycol 17 g Oral Daily   pravastatin 40 mg Oral QPM   senna 8.6 mg Oral QHS   sodium bicarbonate 650 mg Oral BID       . sodium chloride 125 mL/hr at 11/28/16 1713   . amiodarone 0.501 mg/min (11/28/16 1209)   . labetalol (NORMODYNE) infusion 300 mg/300 mL NS Stopped (11/28/16 1702)   . morphine     . niCARdipine Stopped (11/25/16 2119)         Labs:  Recent Labs  Lab 11/28/16  0116 11/27/16  1205 11/26/16  2240  11/25/16  1120 11/25/16  0454   Glucose 210* 218* 134* More results in Results Review 169* 230*   BUN 36* 53* 75* More results in Results Review 85* 75*   Creatinine 1.77* 2.34* 3.25* More results in Results Review 4.21* 3.79*   Sodium 139 137 135* More results in Results Review 134* 133*   Potassium 4.5 4.3 3.8 More results in Results Review 6.4* 6.3*   Chloride 103 101 100 More results in Results Review 104 102   CO2 32.1* 29.6 27.2 More results in Results Review 15.6* 19.4*   Magnesium  --   --   --   --  2.9*  --    AST (SGOT)  --   --   --   --   --  83*   ALT  --   --   --   --   --  78*   More results in Results Review = values in this interval not displayed.  Estimated Creatinine Clearance: 42.9 mL/min (A) (based on SCr of 1.77 mg/dL (H)).      Recent Labs  Lab 11/26/16  0302   CHOL 70*   TRIG 78   HDL 21*   LDL 33         Recent Labs  Lab 11/28/16  0852 11/28/16  0116 11/27/16  1805 11/27/16  1205  11/23/16  0340  11/22/16  1540   WBC 14.9* 14.4* 13.6* 14.4* More results in Results Review 15.2* More results in Results Review 15.0*   Hemoglobin 8.7* 8.6* 8.5* 8.4* More results in Results Review 11.0* More results in Results Review 12.6*   Hematocrit 26.8*  26.7* 26.7* 26.1* More results in Results Review 34.0* More results in Results Review 38.2*   PLT CT 191 180 192 191 More results in Results Review 199 More results in Results Review 221   PT INR  --   --   --  1.4*  --  1.3  --  1.3   More results in Results Review = values in this interval not displayed.    No results for input(s): TROPI, CK, CKMB in the last 8760 hours.      Recent Labs  Lab 11/26/16  0302   TSH 1.81       Radiology: all results from this admission  Xr Chest 2 Views    Result Date: 11/15/2016  Cardiomegaly with cephalization of pulmonary vascular flow. No focal infiltrate, pleural effusion, or pneumothorax. ReadingStation:[email or web address]    Xr Abdomen Ap    Result Date: 11/24/2016  Findings worrisome for partial proximal or mid small bowel obstruction. ReadingStation:[email or web address]    Ct Head Wo Contrast    Result Date: 11/24/2016  1. No evidence of intracranial hemorrhage. 2. Probable subacute infarct superior LEFT frontoparietal lobe gyrus. 3. If clinically indicated, MRI may be helpful in further evaluation. ReadingStation:[email or web address]    Ct Chest Wo Contrast    Result Date: 11/28/2016  1.  DESCENDING THORACIC AORTIC DISSECTION. 2.  LARGE LEFT EFFUSION WITH COMPLETE LEFT LOWER LOBE COLLAPSE. 3.  CARDIOMEGALY WITH PERICARDIAL FLUID. 4.  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 ReadingStation:[email or web address]    Xr Chest Ap Only    Result Date: 11/22/2016  No pneumothorax or significant acute pulmonary abnormalities. Small amount of subcutaneous emphysema in the anterior chest. ReadingStation:[email or web address]    Xr Chest Ap Portable    Result Date: 11/28/2016  1.  NO PNEUMOTHORAX AFTER CHEST TUBE PLACEMENT. 2.  LEFT EFFUSION NEARLY COMPLETELY RESOLVED. ReadingStation:[email or web address]    Xr Chest Ap Portable    Result Date: 11/28/2016  Increase left effusion and worsening airspace disease left lower lung. ReadingStation:[email or web address]    Xr Chest Ap Portable    Result Date: 11/26/2016  Stable pulmonary vascular  congestion and left basilar atelectasis or infiltrate. Probable small left pleural effusion is unchanged. ReadingStation:[id number]    Xr Chest Ap Portable    Result Date: 11/24/2016  Cardiomegaly with bilateral airspace disease or edema with small left effusion. No observed pneumothorax. Interval removal right IJ central line. ReadingStation:[email or web address]    X-ray Chest Ap Portable    Result Date: 11/23/2016  1. Pulmonary vascular congestion without overt failure 2. Left basilar opacity likely combination of atelectasis, consolidation and small effusion. 3. Catheter appliances as above ReadingStation:[email or web address]    Xr Abdomen Portable    Result Date: 11/25/2016  No acute abnormality. Ben Avon, DO

## 2016-11-28 NOTE — Progress Notes (Signed)
CT surgery postop day 6 was on IV heparin infusion now Monroe'd  Complains of more shortness of breath this morning  Back in atrial fibrillation rate of 90 blood pressure 140/86  Pericardial drain 3 mL  Urine output 3100 mL for the day PTT this morning 53 BUN 36 creatinine down 1.77 from 2.34 yesterday  Hematocrit 26.7, hemoglobin 8.6 at 1 AM today, 26.8 and 8.7 at 9 AM, hematocrit 27.2 and hemoglobin 8.9 at 11 PM 11/26/16  . Chest x-ray shows substantial enlargement of left pleural effusion from 2 days ago.    Assessment and plan:  Stable blood pressure but back in atrial fibrillation. Rhythm being addressed by cardiology team.  Substantial left pleural effusion with what appears to be a relatively stable hemoglobin, but in the setting of substantial diuresis post recovery of ATN from pericardial tamponade. Favor ultrasound evaluation and thoracentesis if unable to image comfortably in CT scanner. This appears to be. Blood and not draining adequately by thoracentesis, not unreasonable to proceed to chest tube drainage subsequently.  Discussed with Drs. Clinton andCall.    Keep in ICU today. Restart anticoagulation after pleural effusion satisfactorily addressed.

## 2016-11-28 NOTE — Plan of Care (Signed)
Problem: Compromised Tissue integrity  Goal: Nutritional status is improving  Outcome: Progressing      Problem: Impaired Mobility  Goal: Mobility/Activity is maintained at optimal level for patient  Outcome: Progressing      Problem: Altered GI Function  Goal: Mobility/Activity is maintained at optimal level for patient  Outcome: Progressing      Comments: Patient had increasing pain overnight. Called EICU to request increased Morphine dose from 1 to 2 mg every 2 hours. Patient frequently nauseated and having some dry heaving. Gave zofran with temporary relief, and later gave phenergan which had longer acting results. Patient still having bladder spasms which cause leaking around foley catheter. VSS, will continue to monitor.

## 2016-11-29 ENCOUNTER — Inpatient Hospital Stay: Payer: Medicare Other

## 2016-11-29 LAB — ECG 12-LEAD
Patient Age: 65 years
Q-T Interval(Corrected): 429 ms
Q-T Interval: 326 ms
QRS Axis: 37 deg
QRS Duration: 92 ms
T Axis: 187 years
Ventricular Rate: 104 //min

## 2016-11-29 LAB — CBC AND DIFFERENTIAL
Basophils %: 0 % (ref 0.0–3.0)
Basophils Absolute: 0 10*3/uL (ref 0.0–0.3)
Eosinophils %: 0 % (ref 0.0–7.0)
Eosinophils Absolute: 0 10*3/uL (ref 0.0–0.8)
Hematocrit: 24.8 % — ABNORMAL LOW (ref 39.0–52.5)
Hemoglobin: 8 gm/dL — ABNORMAL LOW (ref 13.0–17.5)
Lymphocytes Absolute: 0.7 10*3/uL (ref 0.6–5.1)
Lymphocytes: 5 % — ABNORMAL LOW (ref 15.0–46.0)
MCH: 30 pg (ref 28–35)
MCHC: 32 gm/dL (ref 32–36)
MCV: 93 fL (ref 80–100)
MPV: 7.4 fL (ref 6.0–10.0)
Monocytes Absolute: 0.3 10*3/uL (ref 0.1–1.7)
Monocytes: 2 % — ABNORMAL LOW (ref 3.0–15.0)
Neutrophils %: 93 % — ABNORMAL HIGH (ref 42.0–78.0)
Neutrophils Absolute: 12.5 10*3/uL — ABNORMAL HIGH (ref 1.7–8.6)
PLT CT: 192 10*3/uL (ref 130–440)
RBC: 2.66 10*6/uL — ABNORMAL LOW (ref 4.00–5.70)
RDW: 16.3 % — ABNORMAL HIGH (ref 11.0–14.0)
WBC: 13.4 10*3/uL — ABNORMAL HIGH (ref 4.0–11.0)

## 2016-11-29 LAB — RENAL FUNCTION PANEL
Albumin: 2.4 gm/dL — ABNORMAL LOW (ref 3.5–5.0)
Anion Gap: 7.4 mMol/L (ref 7.0–18.0)
BUN / Creatinine Ratio: 18.5 Ratio (ref 10.0–30.0)
BUN: 25 mg/dL — ABNORMAL HIGH (ref 7–22)
CO2: 30.3 mMol/L — ABNORMAL HIGH (ref 20.0–30.0)
Calcium: 8.4 mg/dL — ABNORMAL LOW (ref 8.5–10.5)
Chloride: 106 mMol/L (ref 98–110)
Creatinine: 1.35 mg/dL — ABNORMAL HIGH (ref 0.80–1.30)
EGFR: 54 mL/min/{1.73_m2} — ABNORMAL LOW (ref 60–150)
Glucose: 162 mg/dL — ABNORMAL HIGH (ref 71–99)
Osmolality Calc: 285 mOsm/kg (ref 275–300)
Phosphorus: 2.8 mg/dL (ref 2.3–4.7)
Potassium: 4.7 mMol/L (ref 3.5–5.3)
Sodium: 139 mMol/L (ref 136–147)

## 2016-11-29 LAB — CBC
Hematocrit: 27.7 % — ABNORMAL LOW (ref 39.0–52.5)
Hemoglobin: 8.7 gm/dL — ABNORMAL LOW (ref 13.0–17.5)
MCH: 30 pg (ref 28–35)
MCHC: 31 gm/dL — ABNORMAL LOW (ref 32–36)
MCV: 94 fL (ref 80–100)
MPV: 7.3 fL (ref 6.0–10.0)
PLT CT: 210 10*3/uL (ref 130–440)
RBC: 2.94 10*6/uL — ABNORMAL LOW (ref 4.00–5.70)
RDW: 16.6 % — ABNORMAL HIGH (ref 11.0–14.0)
WBC: 13.1 10*3/uL — ABNORMAL HIGH (ref 4.0–11.0)

## 2016-11-29 LAB — PT/INR
PT INR: 1.3 (ref 0.5–1.3)
PT: 13 s — ABNORMAL HIGH (ref 9.5–11.5)

## 2016-11-29 LAB — VH DEXTROSE STICK GLUCOSE
Glucose POCT: 171 mg/dL — ABNORMAL HIGH (ref 71–99)
Glucose POCT: 135 mg/dL — ABNORMAL HIGH (ref 71–99)
Glucose POCT: 172 mg/dL — ABNORMAL HIGH (ref 71–99)
Glucose POCT: 197 mg/dL — ABNORMAL HIGH (ref 71–99)

## 2016-11-29 LAB — APTT: aPTT: 29.1 s (ref 24.0–34.0)

## 2016-11-29 MED ORDER — VH HEPARIN SODIUM (PORCINE) 10000 UNIT/ML IJ SOLN (REBOLUS)
2000.0000 [IU] | Freq: Four times a day (QID) | INTRAMUSCULAR | Status: DC | PRN
Start: 2016-11-29 — End: 2016-12-01
  Administered 2016-11-30: 02:00:00 2000 [IU] via INTRAVENOUS
  Filled 2016-11-29: qty 1

## 2016-11-29 MED ORDER — VH HEPARIN (PORCINE) IN D5W 50-5 UNIT/ML-% IV SOLN
0.0000 [IU]/h | INTRAVENOUS | Status: DC
Start: 2016-11-29 — End: 2016-11-29

## 2016-11-29 MED ORDER — VH HEPARIN SODIUM (PORCINE) 10000 UNIT/ML IJ SOLN (REBOLUS)
2000.0000 [IU] | Freq: Four times a day (QID) | INTRAMUSCULAR | Status: DC | PRN
Start: 2016-11-29 — End: 2016-11-29

## 2016-11-29 MED ORDER — VH HEPARIN (PORCINE) IN D5W 50-5 UNIT/ML-% IV SOLN
0.0000 [IU]/h | INTRAVENOUS | Status: DC
Start: 2016-11-29 — End: 2016-12-01
  Administered 2016-11-29: 20:00:00 950 [IU]/h via INTRAVENOUS
  Administered 2016-11-30: 18:00:00 1300 [IU]/h via INTRAVENOUS
  Administered 2016-12-01: 15:00:00 1400 [IU]/h via INTRAVENOUS
  Filled 2016-11-29 (×3): qty 500

## 2016-11-29 MED ORDER — VH MORPHINE 1 MG/ML 50 ML
INTRAVENOUS | Status: DC
Start: 2016-11-29 — End: 2016-11-29

## 2016-11-29 MED ORDER — LABETALOL HCL 200 MG PO TABS
200.0000 mg | ORAL_TABLET | Freq: Two times a day (BID) | ORAL | Status: DC
Start: 2016-11-29 — End: 2016-11-30
  Administered 2016-11-29 (×2): 200 mg via ORAL
  Filled 2016-11-29 (×3): qty 1

## 2016-11-29 MED ORDER — FUROSEMIDE 10 MG/ML IJ SOLN
40.0000 mg | Freq: Two times a day (BID) | INTRAMUSCULAR | Status: DC
Start: 2016-11-29 — End: 2016-12-01
  Administered 2016-11-29 – 2016-12-01 (×4): 40 mg via INTRAVENOUS
  Filled 2016-11-29 (×5): qty 4

## 2016-11-29 MED ORDER — VH MORPHINE 1 MG/ML 50 ML
INTRAVENOUS | Status: DC
Start: 2016-11-29 — End: 2016-11-30
  Administered 2016-11-29 – 2016-11-30 (×2): 50 mg via INTRAVENOUS
  Filled 2016-11-29: qty 50

## 2016-11-29 MED ORDER — HEPARIN SODIUM (PORCINE) PF 5000 UNIT/0.5ML IJ SOLN
5000.0000 [IU] | Freq: Three times a day (TID) | INTRAMUSCULAR | Status: DC
Start: 2016-11-29 — End: 2016-11-29
  Administered 2016-11-29: 15:00:00 5000 [IU] via SUBCUTANEOUS
  Filled 2016-11-29: qty 0.5

## 2016-11-29 MED ORDER — AMIODARONE HCL 200 MG PO TABS
200.0000 mg | ORAL_TABLET | Freq: Two times a day (BID) | ORAL | Status: DC
Start: 2016-11-29 — End: 2016-12-09
  Administered 2016-11-29 – 2016-12-09 (×20): 200 mg via ORAL
  Filled 2016-11-29 (×21): qty 1

## 2016-11-29 NOTE — Progress Notes (Signed)
Renal Progress Note    Follow up for:  ARF  CKD, stage 3    Subjective:     Continues to improve with respect to mobility and subjective strength  No dyspnea or chest pain reported  CT remains in place; draining serosanguinous fluid      Objective:     Vitals: BP 126/79   Pulse 68   Temp 98.1 F (36.7 C) (Oral)   Resp (!) 23   Ht 1.702 m (5\' 7" )   Wt 85.4 kg (188 lb 4.4 oz)   SpO2 99%   BMI 29.49 kg/m       I/O:     Intake/Output Summary (Last 24 hours) at 11/29/16 1544  Last data filed at 11/29/16 1506   Gross per 24 hour   Intake          4498.99 ml   Output             1765 ml   Net          2733.99 ml       Weights:  Wt Readings from Last 1 Encounters:   11/29/16 0352 85.4 kg (188 lb 4.4 oz)   11/27/16 0553 88.1 kg (194 lb 3.6 oz)   11/26/16 0525 87.8 kg (193 lb 9 oz)   11/25/16 0540 86.3 kg (190 lb 4.8 oz)   11/24/16 0549 86.7 kg (191 lb 2.2 oz)   11/22/16 0608 85.6 kg (188 lb 11.4 oz)   11/15/16 1033 85.3 kg (188 lb)       Physical Exam:  General: No distress.  Lungs: CTA  Heart: RRR, no murmur, gallop, rub, or heave.  Abdomen: BS present, soft, NT/ND, no organomegaly.  Extremities: 1+ edema.  Access: N/A        Labs:  Labs reviewed.  Radiology results reviewed.  Pertinent findings below.  Recent Labs      11/29/16   0504  11/28/16   0852   WBC  13.4*  14.9*   Hemoglobin  8.0*  8.7*   Hematocrit  24.8*  26.8*   PLT CT  192  191     Recent Labs      11/28/16   1307  11/28/16   0116  11/27/16   1805  11/27/16   1205   PT   --    --    --   14.1*   PT INR   --    --    --   1.4*   aPTT  28.7  53.0*  48.8*  30.6     No results for input(s): TROPI, CK, CKMBINDEX in the last 72 hours.  No results for input(s): BNP in the last 72 hours.        Xr Chest Ap Portable    Result Date: 11/29/2016  1.  Left apical chest tube in place without appreciable pneumothorax. 2.  Hazy opacification of the left base likely represents a combination of atelectasis and a small amount of pleural fluid. 3.  Unchanged cardiomegaly.  ReadingStation:[id number]     Recent Labs      11/29/16   0504  11/28/16   0116  11/27/16   1205   Glucose  162*  210*  218*   Sodium  139  139  137   Potassium  4.7  4.5  4.3   Chloride  106  103  101   CO2  30.3*  32.1*  29.6   BUN  25*  36*  53*   Creatinine  1.35*  1.77*  2.34*   EGFR  54*  39*  28*   Calcium  8.4*  8.5  8.4*     Recent Labs      11/29/16   0504  11/28/16   0116  11/27/16   1205   Phosphorus  2.8  2.7  3.3     Recent Labs      11/29/16   0504  11/28/16   1627  11/28/16   0116  11/27/16   1205   Albumin  2.4*   --   2.6*  2.7*   Protein, Total   --   6.1   --    --     No results for input(s): SGUR, LABPH, PROTEINUR, GLUUA, KETONESUA, BILIUA, BLOODUA, NITRITEUA, UROBILIUA, LEUA, NITRITEUA, WBCUA, RBCUA, BACTERIAUA in the last 72 hours.    Invalid input(s):  AMORPHOUSUA               Assessment:     1. AKI - pre-renal ischemia +/- ATN resulting from pericardial effusion; peak creatinine 4.64 this morning, now improving steadily  2. Stage III chronic kidney disease-with apparent baseline creatinine 1.6-2.1 mg/dL; most recent creatinine value on outpatient basis had been 2.0 mg/dL on 11/15/16. Suspect underlying diabetic/hypertensive kidney disease.  3. Hyperkalemia-associated with reduced renal function and ACE inhibitor use; also component of hyperglycemia  4. Metabolic acidosis-associated with reduced renal function, resolved  5. Ileus, improved  6. Status post hybrid left atrial ablation (convergent procedure) for atrial fibrillation  7. Hypertension  8. Subacute CVA of left frontoparietal lobes  9. Type 2 diabetes mellitus      Plan:      sCr continues to improve steadily and now better than previous baseline possibly owing to volume expansion   Agree with stopping maintenance fluids.   Will attempt to institute maintenance diuretic therapy   Stop bicarbonate replacement   Continue with daily chemistries for now      Orders and Medications reviewed in Epic.  Case reviewed with Dr.  Randa Evens, Pager [phone]  3:44 PM  11/29/2016  [facility]  Dr. Hurley Cisco, Dr. Josephina Shih, Dr. Fernande Boyden  Office: [phone]

## 2016-11-29 NOTE — PT Progress Note (Signed)
______________________________________________________________________    I have reviewed and agree with the documentation authored by my student of the evaluation/ treatment/education/care plan delivered during my direct supervision.     Co-Signed by:  Jillyn Hidden, PT  ______________________________________________________________________    [facility]: [facility]  Department of Rehabilitation Services: [phone]  [name]    CSN: [id number]    CRITICAL CARE 4   [id number]/3548-A    Physical Therapy Treatment Note    Time of treatment:   Time Calculation  PT Received On: 11/29/16  Start Time: 0918  Stop Time: 0941  Time Calculation (min): 23 min    Visit#: 2    Last seen by Physical therapist vs. PTA: 11/29/16    Medical Diagnosis/Pertinent medical/surgical details: hybrid convergent atrial ablation procedure on 11/22/16. On 11/25/16, the patient was transferred to the ICU with shortness of breath. The patient had a large pericardial effusion drained on 11/25/16. The patient is also presenting with right sided weakness, particularly on the right upper extremity. 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    Precautions and Contraindications:  Falls  Mobility protocol   Pericardial drain in place on left upper chest  SBP between 120-160    Assessment:   Patient's progress towards established goals: the patient made no progress towards his goals today. He completed bed mobility and transfers with moderate assistance. He required moderate assist x 2 to stand pivot transfer to a bedside chair. The patient presented with increased confusion today and delayed response to questions compared to last session. He also presented with increased right sided weakness and lower extremity edema.     Patient continues to have the following impairments: decreased ROM, decreased strength, decreased safety/judgement during functional mobility, decreased  activity tolerance, impaired coordination, impaired motor control , decreased functional mobility, decreased balance, gait deficits, pain    Patient will continue to benefit from skilled PT services in order to address the above limitations and return to a higher level of function.       Goals:   To be completed by discharge:  1. The patient will complete all bed mobility with supervision in order to prepare to transfer. ONGOING  2. The patient will complete sit to stand and stand to sit transfers with supervision in order to prepare to ambulate. ONGOING  3. The patient will ambulate 150 feet with supervision with LRAD in order to demonstrate independence with mobility. ONGOING  4. The patient will ascend/descend 7 stairs with 2 rails with supervision in order to demonstrate the ability to get into his home. ONGOING       Plan:   Treatment/interventions: Exercise, Gait training, Stair training, Neuromuscular re-education, Functional transfer training, LE strengthening/ROM, Patient/caregiver training, Bed mobility    Treatment Frequency: 4-5x/wk    DISCHARGE RECOMMENDATIONS   DME recommended for Discharge:   TBD at next level of care    Discharge Recommendations:   SNF   Pending progress in next therapy session    Subjective:   "It takes me awhile to respond because I have to really think about it" the patient reported at the beginning of today's session.   Patient is agreeable to participation in the therapy session.    Pain:  The patient was in pain at rest and with activity today, but was unable to report the intensity of the pain or the location of the pain even after repeated prompting. He appeared to be in  more pain during activity than at rest.    OBJECTIVE:   Observation of Patient/Vital Signs:   Patient is in bed with dressings, Bed/chair alarm on, telemetry, continuous pulse oximeter, SCD's, 3L O2, chest tube, catheter, peripheral IV, PCA  Patient's medical condition is appropriate for Physical therapy  intervention at this time.    Vital Signs:  BP Supine:  109/66 mmHg  BP after activity: 108/81 mmHg  HR Supine: 81 bpm  HR after activity: 84 bpm  SpO2 at rest: 97%  SpO2 with activity: 93%  on 3L supplemental O2    Edema: patient with noticeable increase of edema in BLE    Oriented to: Person and Situation  Command following: Follows 1 step commands with increased time  Insights: Decreased awareness of deficits    Musculoskeletal and Balance Details:   Balance:  Static Sitting:  Good  Dynamic Sitting:  Fair+ patient with increased trunk sway  Static Standing:  Fair patient required assist x 2 to maintain standing balance  Dynamic Standing:  Poor+ patient required moderate assist x 2 to maintain balance during stand pivot transfer        Right trunk lean observed in static sitting    Bed Mobility:   Supine to Sit:   Moderate assist (assist x2 for safety).   Cues for Sequencing., Cues for Hand placement., assist at trunk, assist at LE(s)  Seated Scooting:   Minimal assist    Transfers:  Sit to Stand:  Moderate assist with Hand held assist.    Cues for Sequencing, Cues for Foot Placement  Stand to Sit:  Moderate assist.    Cues for Hand Placement  Stand Pivot:   Moderate assist (assist x2 for safety) with Hand held assist.   Cues for Sequencing, Cues for Hand Placement, Cues for Foot Placement, patient unable to lift RLE off of floor to advance    Locomotion:  Not tested due to poor standing balance    Participation and Activity Tolerance   Participation effort: Good  Activity Tolerance: Tolerates 10-20 minutes of activity with multiple rests    Other Treatment Interventions this session:   Therapeutic activity: bed mobility and functional transfer training.    Education Provided:   TOPICS: role of physical therapy, plan of care, goals of therapy and benefits of activity    Learner educated: Patient  Method: Explanation  Response to education: Verbalized understanding    Patient Position at End of Treatment:    Sitting, in a chair, Staff present: RN, Issac, Needs in reach, Bed/chair alarm set and No distress    Team Communication:   Spoke to : RN/LPN - Bland Span  Regarding: Pre-session re: patient status, Patient position at end of session, Patient participation with Therapy  Whiteboard updated: No  PT/PTA communication: via written note and verbal communication as needed.      Recommend patient sit on edge of bed with moderate assist x 2, transfer to bedside chair with moderate assist x 2 outside of PT sessions.    Dwan Bolt, SPT

## 2016-11-29 NOTE — Plan of Care (Signed)
Problem: Moderate/High Fall Risk Score >5  Goal: Patient will remain free of falls  Outcome: Progressing     Problem: Nutrition  Goal: Nutritional intake is adequate  Outcome: Progressing     Problem: Altered GI Function  Goal: Nutritional intake is adequate  Outcome: Progressing

## 2016-11-29 NOTE — Progress Notes (Signed)
Bedside report given to day shift RN.

## 2016-11-29 NOTE — Progress Notes (Signed)
[facility]   Daily Progress Note      [name], [name]  Date/Time: 11/29/16 9:58 AM  Attending Physician: [name], MD  Location/Room: 3548/3548-A     Interval Events:   Pericardial drain removed  Bedside telemetry isn't most consistent with persistent A. fib rule out sinus with frequent PACs  Patient continues on intravenous amiodarone heparin drip discontinued  Status post placement of a smallbore left chest tube with drainage of 1200 sit 50 mL of serosanguineous/bloody fluid  Chest x-ray with minimal atelectasis/pleural reaction at left base   Still evidence of total  body fluid overloaded on physical exam     Improved pain control with morphine via PCA , no complaints of increased shortness of breath or increased chest pain    Problem List:     Active Hospital Problems    Diagnosis   . Chronic anticoagulation   . Stroke   . CKD (chronic kidney disease)   . Former smoker, stopped smoking many years ago   . Type 2 diabetes mellitus   . HTN (hypertension)   . HLD (hyperlipidemia)   . Diabetic neuropathy   . COPD (chronic obstructive pulmonary disease)   . Bipolar disorder   . BPH (benign prostatic hyperplasia)   . Atrial fibrillation   . Chronic atrial fibrillation   . 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  The procedure itself was uncomplicated.  The patient was  extubated without difficulty.  On arrival to the intensive care unit, a 500 cc bolus of fluid was drained from the mediastinal via the  pericardial drain.  It is unclear whether or not the collection of blood was related pooling during the ablation procedure or whether it represented active ongoing bleeding.  There was no evidence of pericardial tamponade via abnormal hemodynamics and there was no evidence of an enlarging cardiac silhouette. Initially, the patient did well but then began to experience increasing dyspnea and worsening renal function. A repeat echocardiogram revealed a large circumferential pericardial effusion. He underwent placement of a pericardial pigtail catheter. He developed a new right hemiplegia right upper greater than right lower and was sent for a CT scan of the brain on 11/24/2016 which showed a likely subacute infarct of the superior left frontoparietal lobe gyrus assistant with an acute CVA syndrome. Yesterday, the patient was placed on systemic anticoagulation prior to planned removal of the pericardial catheter today. Overnight he complained of nausea, occasional emesis, and was noted to have decreased drainage via the pericardial catheter. workup revealed recurrent atrial fibrillation, and a large left effusion. CT of the thorax documented large left effusion with scant pericardial effusion and a previously unrecognized ascending aortic dissection with dilated pulmonary arteries suggesting pulmonary hypertension. The patient was begun on a labetalol drip to maintain BP systolic less than 68.  An echocardiogram demonstrated an ejection fraction of 65% with concentric left ventricular hypertrophy and borderline dilated right ventricle. There was mild dilatation of the ascending aorta measured at 4.1 cm, mildly AS with mild to moderate AI. The patient's heparin heads was discontinued. A left chest tube was placed with drainage  of serosanguineous/bloody fluid as discussed above. Overnight the patient remains in A. fib but now it's unclear if the rhythm is sinus with PACs versus persistent A. fib with controlled  ventricular response.    Cardiovascular:Repeat EKG to assess rhythm. We will transition to by mouth labetalol and transition off labetalol drip, restart systemic anticoagulation as per cardiology consultant. Continue intravenous amiodarone as per attending.     Pulmonary: Remote history of tobacco abuse and suggestion of severe obstructive airways disease based on preop pulmonary spirometry. Pulmonary function tests are not available for review .  no acute distress. Distress, continue per current pulmonary toilet. CT of the thorax and echocardiogram is suggestive of significant pulmonary hypertension. Will need further workup when acute illness is over. Left chest tube with scant drainage but unclear if patient is to be anticoagulated if so, may delay removal of chest tube until a.m. otherwise, consider removal of left chest tube this afternoon.    Gastrointestinal:No further nausea/vomiting, no further interventions planned, begin consistent carbohydrate diet     Infectious Disease: Remains afebrile with decreasing white blood cell count and no left shift-continue to withhold antibiotics    Neurologic: Patient with possible extension of previous CVA involving the superior left frontoparietal lobe gyrus with ?reversibly increased neuro deficits secondary to hypoperfusion versus TIA. Right upper extremity neuro deficit is resolved, continue PT/OT.  good control of chest pain complaints via PCA., Continue, then MRI/MRA once acute renal failure is resolved    Renal: Fluid balance is negati1.2 L, creatinine is further improved, patient taking well by mouth, discontinue IV fluids. Patient with evidence of peripheral edema suggesting persistent total body fluid overload. Diurese as per nephrology consult    Onc: Hemoglobin/hematocrit, platelets stable , resume heparin for DVT/PE prophylaxis pending decision to resume systemic anticoagulation     Endocrine: Serial serum glucAre improved, continue current Lantus plus  sliding scale, may need to adjust further as by mouth intake increases    ICU Management:  Sedation: None needed.  Nutrition: Consistent carbohydrate diet  GI Prophylaxis: PPI bid  VTE Prophylaxis: SCDs/teds  Foley Catheter: continue    Disposition: Keep in ICU  Code Status: Full Code    Physical Exam:   Vitals: BP 99/79   Pulse 80   Temp 98.4 F (36.9 C) (Oral)   Resp 13   Ht 1.702 m (5\' 7" )   Wt 85.4 kg (188 lb 4.4 oz)   SpO2 97%   BMI 29.49 kg/m   I/O:     Intake/Output Summary (Last 24 hours) at 11/29/16 0958  Last data filed at 11/29/16 9924   Gross per 24 hour   Intake          4032.49 ml   Output             4640 ml   Net          -607.51 ml     Vent settings:      General Appearance:  Well-developed, well-nourished 66 year old white male who is alert and oriented 3 in no apparent respiratory distress     Mental status: Alert, oriented 3     Neuro:Muscle strength 5/5 upper and lower extremities bilaterally, no sensory deficit appreciated     HEENT:NCAT     Neck: No cervical adenopathy     Lungs:Breath sounds are Decreased on left, few central rhonchi, Few rales, no wheezing     Cardiac: Irregularly irregular rate and rhythm, S1, S2, no S3, no S4, no murmurs,  rubs or gallops     Abdomen: Bowel sounds are normal, abdomen is soft, distended, nontender with no gross hepatosplenomegaly     Extremities:No cyanosis, clubbing, 1-2+ pedal edema      Skin: No acute skin breakdown appreciated     Review of Systems:   Pertinent items are noted in HPI. All other systems are negative.    Labs:   Interval Labs in the [facility] record were reviewed including the following:  CBC:     Recent Labs  Lab 11/29/16  0504 11/28/16  0852 11/28/16  0116   WBC 13.4* 14.9* 14.4*   RBC 2.66* 2.92* 2.88*   Hemoglobin 8.0* 8.7* 8.6*   Hematocrit 24.8* 26.8* 26.7*   MCV 93 92 93   PLT CT 192 191 180     Chemistry:   Recent Labs  Lab 11/29/16  0504 11/28/16  0116 11/27/16  1205 11/26/16  2240 11/26/16  1157  11/25/16  1120   11/23/16  0340   Sodium 139 139 137 135* 132* More results in Results Review 134* More results in Results Review 142   Potassium 4.7 4.5 4.3 3.8 4.2 More results in Results Review 6.4* More results in Results Review 4.2   Chloride 106 103 101 100 98 More results in Results Review 104 More results in Results Review 111*   CO2 30.3* 32.1* 29.6 27.2 22.8 More results in Results Review 15.6* More results in Results Review 23.5   BUN 25* 36* 53* 75* 87* More results in Results Review 85* More results in Results Review 31*   Creatinine 1.35* 1.77* 2.34* 3.25* 4.25* More results in Results Review 4.21* More results in Results Review 1.62*   Glucose 162* 210* 218* 134* 237* More results in Results Review 169* More results in Results Review 120*   Calcium 8.4* 8.5 8.4* 8.3* 7.9* More results in Results Review 9.4 More results in Results Review 8.7   Magnesium  --   --   --   --   --   --  2.9*  --  2.3   More results in Results Review = values in this interval not displayed.  LFTs:     Recent Labs  Lab 11/29/16  0504 11/28/16  0116 11/27/16  1205  11/25/16  0454   ALT  --   --   --   --  78*   AST (SGOT)  --   --   --   --  83*   Bilirubin, Total  --   --   --   --  1.4*   Albumin 2.4* 2.6* 2.7* More results in Results Review 3.3*   Alkaline Phosphatase  --   --   --   --  66   More results in Results Review = values in this interval not displayed.  Cardiac Enzymes:     Coags:     Recent Labs  Lab 11/27/16  1205 11/23/16  0340 11/22/16  1540   PT INR 1.4* 1.3 1.3   PT 14.1* 13.2* 13.1*       Radiology / Imaging:     Imaging personally reviewed by me, including CXR: Chest x-ray as above.    Attestation & Billing:   Patient's condition and plan discussed with: patient, family and consultants    This patient has a high probability of sudden clinically significant deterioration which requires the highest level of physician preparedness to intervene urgently. I managed/supervised life or organ supporting interventions that  required frequent physician assessments. I devoted my full attention in the ICU to the direct care of this patient for this period of time.    Any critical care time was performed today and is exclusive of teaching, billable procedures, and not overlapping with any other providers.    Billing: Critical care time: 40 minutes.    Signed by: Robyn Haber, MD   SU:[id number], Delphia Grates, MD

## 2016-11-29 NOTE — Progress Notes (Signed)
Cardiology Progress Note      Date Time: 11/29/16 6:29 PM  [name], [name]      Assessment:     Active Hospital Problems    Diagnosis   . Chronic anticoagulation   . Stroke   . CKD (chronic kidney disease)   . Former smoker, stopped smoking many years ago   . Type 2 diabetes mellitus   . HTN (hypertension)   . HLD (hyperlipidemia)   . Diabetic neuropathy   . COPD (chronic obstructive pulmonary disease)   . Bipolar disorder   . BPH (benign prostatic hyperplasia)   . Atrial fibrillation   . Chronic atrial fibrillation   . Stage 4 chronic kidney disease             Plan:     1. Chest tube with serosanguinous drainage. Will plan on re-initiating heparin tonight, reassess tomorrow in terms of pleural and pericardial effusions.   2. Renal function is improving.   3. D/C IV amiodarone, continue oral amiodarone.         Subjective:   Feels a bit stronger.     Physical Exam:     Temp:  [98 F (36.7 C)-98.4 F (36.9 C)] 98.2 F (36.8 C)  Heart Rate:  [68-101] 79  Resp Rate:  [12-23] 13  BP: (92-129)/(64-85) 106/68    Wt Readings from Last 3 Encounters:   11/29/16 85.4 kg (188 lb 4.4 oz)   10/21/16 85.5 kg (188 lb 9.6 oz)   10/07/16 84 kg (185 lb 3.2 oz)            Intake/Output Summary (Last 24 hours) at 11/29/16 1829  Last data filed at 11/29/16 1735   Gross per 24 hour   Intake          3571.29 ml   Output             1740 ml   Net          1831.29 ml       General appearance - alert, well appearing, and in no distress  Chest -inspiratory wheezing, poor air movement  Heart - irregularly irregular rhythm, S4 present  Extremities - 2+ lower extremity edema  Abd: soft. Non-tender, non-distended,  Neuro: alert and oriented  Skin: no rashes  Psych: normal affect    Medications:     Scheduled Meds:   albuterol-ipratropium 3 mL Nebulization Q6H [employer]   amiodarone 200 mg Oral BID   divalproex EC/DR tablet 500 mg Oral Q12H SCH   docusate sodium 100 mg Oral Daily   furosemide 40 mg Intravenous BID   gabapentin  100 mg Oral Q12H SCH   heparin (porcine) 5,000 Units Subcutaneous Q8H [place]   insulin glargine 10 Units Subcutaneous Q12H SCH   labetalol 200 mg Oral Q12H SCH   pantoprazole 40 mg Oral BID AC   polyethylene glycol 17 g Oral Daily   pravastatin 40 mg Oral QPM   senna 8.6 mg Oral QHS       . labetalol (NORMODYNE) infusion 300 mg/300 mL NS 0.25 mg/min (11/29/16 1746)   . morphine           Labs:       Recent Labs  Lab 11/29/16  0504 11/28/16  0116 11/27/16  1205  11/25/16  1120 11/25/16  0454   Glucose 162* 210* 218* More results in Results Review 169* 230*   BUN 25* 36* 53* More results in Results Review 85* 75*  Creatinine 1.35* 1.77* 2.34* More results in Results Review 4.21* 3.79*   Sodium 139 139 137 More results in Results Review 134* 133*   Potassium 4.7 4.5 4.3 More results in Results Review 6.4* 6.3*   Chloride 106 103 101 More results in Results Review 104 102   CO2 30.3* 32.1* 29.6 More results in Results Review 15.6* 19.4*   Magnesium  --   --   --   --  2.9*  --    AST (SGOT)  --   --   --   --   --  83*   ALT  --   --   --   --   --  78*   More results in Results Review = values in this interval not displayed.  Estimated Creatinine Clearance: 55.4 mL/min (A) (based on SCr of 1.35 mg/dL (H)).      Recent Labs  Lab 11/26/16  0302   CHOL 70*   TRIG 78   HDL 21*   LDL 33         Recent Labs  Lab 11/29/16  0504 11/28/16  0852 11/28/16  0116  11/27/16  1205  11/23/16  0340   WBC 13.4* 14.9* 14.4* More results in Results Review 14.4* More results in Results Review 15.2*   Hemoglobin 8.0* 8.7* 8.6* More results in Results Review 8.4* More results in Results Review 11.0*   Hematocrit 24.8* 26.8* 26.7* More results in Results Review 26.1* More results in Results Review 34.0*   PLT CT 192 191 180 More results in Results Review 191 More results in Results Review 199   PT INR  --   --   --   --  1.4*  --  1.3   More results in Results Review = values in this interval not displayed.    No results for input(s): TROPI,  CK, CKMB in the last 8760 hours.      Recent Labs  Lab 11/26/16  0302   TSH 1.81       Radiology: all results from this admission  Xr Chest 2 Views    Result Date: 11/15/2016  Cardiomegaly with cephalization of pulmonary vascular flow. No focal infiltrate, pleural effusion, or pneumothorax. ReadingStation:[email or web address]    Xr Abdomen Ap    Result Date: 11/24/2016  Findings worrisome for partial proximal or mid small bowel obstruction. ReadingStation:[email or web address]    Ct Head Wo Contrast    Result Date: 11/24/2016  1. No evidence of intracranial hemorrhage. 2. Probable subacute infarct superior LEFT frontoparietal lobe gyrus. 3. If clinically indicated, MRI may be helpful in further evaluation. ReadingStation:[email or web address]    Ct Chest Wo Contrast    Result Date: 11/28/2016  1.  DESCENDING THORACIC AORTIC DISSECTION. 2.  LARGE LEFT EFFUSION WITH COMPLETE LEFT LOWER LOBE COLLAPSE. 3.  CARDIOMEGALY WITH PERICARDIAL FLUID. 4.  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 ReadingStation:[email or web address]    Xr Chest Ap Only    Result Date: 11/22/2016  No pneumothorax or significant acute pulmonary abnormalities. Small amount of subcutaneous emphysema in the anterior chest. ReadingStation:[email or web address]    Xr Chest Ap Portable    Result Date: 11/29/2016  1.  Left apical chest tube in place without appreciable pneumothorax. 2.  Hazy opacification of the left base likely represents a combination of atelectasis and a small amount of pleural fluid. 3.  Unchanged cardiomegaly. ReadingStation:[email or web address]    Xr Chest Ap Portable    Result Date: 11/28/2016  1.  NO PNEUMOTHORAX AFTER CHEST  TUBE PLACEMENT. 2.  LEFT EFFUSION NEARLY COMPLETELY RESOLVED. ReadingStation:[email or web address]    Xr Chest Ap Portable    Result Date: 11/28/2016  Increase left effusion and worsening airspace disease left lower lung. ReadingStation:[email or web address]    Xr Chest Ap Portable    Result Date: 11/26/2016  Stable pulmonary vascular congestion and left basilar atelectasis or  infiltrate. Probable small left pleural effusion is unchanged. ReadingStation:[id number]    Xr Chest Ap Portable    Result Date: 11/24/2016  Cardiomegaly with bilateral airspace disease or edema with small left effusion. No observed pneumothorax. Interval removal right IJ central line. ReadingStation:[email or web address]    X-ray Chest Ap Portable    Result Date: 11/23/2016  1. Pulmonary vascular congestion without overt failure 2. Left basilar opacity likely combination of atelectasis, consolidation and small effusion. 3. Catheter appliances as above ReadingStation:[email or web address]    Xr Abdomen Portable    Result Date: 11/25/2016  No acute abnormality. [place], DO

## 2016-11-29 NOTE — Progress Notes (Signed)
Nutrition Therapy  Nutrition Assessment    Patient Information:     Name:[name]   Age: 66 y.o.   Sex: male     MRN: [id number]    Recommendation:     1. Continue Consistent Carbohydrate diet    Nutrition History:     Pt admitted with afib. Day #5 ICU. Hx DM, gout, among others. Tolerating diet well. Consuming 100% of meals. Appears well-nourished. No recent wt loss. No nutrition interventions at this time.     Nutrition Risk Level: Low    Nutrition Diagnosis:     No nutrition diagnosis at this time.       Monitoring:  Evaluation:    PO/EN/PN intake:  Amount of food    Labs:  Electrolyte Profile and Glucose, casual    GI Profile:  Bowel Function   Nutrition Focused Physical:  Overall appearance and Digestive system       Assessment Data:     Admission Dx:  Atrial fibrillation, unspecified type [I48.91]  PMH:  has a past medical history of Arrhythmia; Arthritis; Atrial fibrillation; Atrial flutter; Bipolar affective; BPH (benign prostatic hyperplasia); Complication of anesthesia; Diabetes mellitus; Diabetic neuropathy; Gout; Heart murmur; HOH (hard of hearing); Hyperlipidemia; Hypertension; Paroxysmal atrial fibrillation; Pulmonary hypertension; Renal insufficiency; Type 2 diabetes mellitus, controlled; and Wears glasses.  PSH:  has a past surgical history that includes Appendectomy; Hernia repair; TONSILLECTOMY, ADENOIDECTOMY; CARDIAC ABLATION; Cardioversion; TEE; Cardiac catheterization; and Tonsillectomy.     Height: 1.702 m (5\' 7" )   Weight: 85.4 kg (188 lb 4.4 oz)   Weight Monitoring Weight Weight Method   01/13/2015 86.4 kg Actual   09/27/2016 85.821 kg    10/07/2016 84.006 kg    11/22/2016 85.6 kg Actual   11/24/2016 86.7 kg    11/25/2016 86.32 kg Actual   11/26/2016 87.8 kg Bed Scale   11/27/2016 88.1 kg Bed Scale   11/29/2016 85.4 kg Bed Scale   BMI: Body mass index is 29.49 kg/m.   IBW: 67kg    Pertinent Meds: colace, lantus, senokot, miralax, protonix, morphine drip, others noted  Pertinent  Labs:    Recent Labs  Lab 11/29/16  0504 11/28/16  0116 11/27/16  1205 11/26/16  2240 11/26/16  1157  11/25/16  1120  11/23/16  0340   Sodium 139 139 137 135* 132* More results in Results Review 134* More results in Results Review 142   Potassium 4.7 4.5 4.3 3.8 4.2 More results in Results Review 6.4* More results in Results Review 4.2   Chloride 106 103 101 100 98 More results in Results Review 104 More results in Results Review 111*   CO2 30.3* 32.1* 29.6 27.2 22.8 More results in Results Review 15.6* More results in Results Review 23.5   BUN 25* 36* 53* 75* 87* More results in Results Review 85* More results in Results Review 31*   Creatinine 1.35* 1.77* 2.34* 3.25* 4.25* More results in Results Review 4.21* More results in Results Review 1.62*   Glucose 162* 210* 218* 134* 237* More results in Results Review 169* More results in Results Review 120*   Calcium 8.4* 8.5 8.4* 8.3* 7.9* More results in Results Review 9.4 More results in Results Review 8.7   Magnesium  --   --   --   --   --   --  2.9*  --  2.3   Phosphorus 2.8 2.7 3.3 4.8* 6.4* More results in Results Review  --   --   --  More results in Results Review = values in this interval not displayed.       Diet Order:  Orders Placed This Encounter   Procedures   . Diet consistent carbohydrate        GI symptoms:    +BM 4/20  Hydration:   . amiodarone 0.5 mg/min (11/28/16 2002)   . labetalol (NORMODYNE) infusion 300 mg/300 mL NS 2 mg/min (11/29/16 0834)   . morphine       I/O:  reviwed  Skin:     Braden 16, Nutrition 2  Edema: +3 LE, +1 UE    Food Security Issues:  No      Learning Needs:  No education needs at this time      Additional Comments:       Melvenia Beam, RD, CNSC  11/29/2016 11:52 AM

## 2016-11-29 NOTE — Plan of Care (Signed)
Problem: Compromised Tissue integrity  Goal: Damaged tissue is healing and protected  Outcome: Progressing     Problem: Moderate/High Fall Risk Score >5  Goal: Patient will remain free of falls  Outcome: Progressing     Problem: Hemodynamic Status: Cardiac  Goal: Stable vital signs and fluid balance  Outcome: Progressing

## 2016-11-29 NOTE — Progress Notes (Signed)
[place] [id number]     PROGRESS NOTE    Case Management       Inpatient Plan of Care:      POD # 7 hybrid left atrial ablation w/resection of portion of xyphoid,  AKI,    Pericardiocentesis 4/19 w/700cc removed. Additional 147 after drain placed- drain removed 4/22   CT for large effusion   A-fib on Amiodarone gtt   Labetolol gtt to po    Nephro following-Creatinine 4.64   CVA-w/right sided weakness followed by Neuro-PT/OT working w/pt, up in chair today.   PCA-Morphine for pain management    Serial monitoring/exams   Supportive Care   CM Interventions:      Chart reviewed, spoke w/pt and in light of CVA and weakness of right side, pt needs SNF for rehab.    Matching list given to patient of SNF for rehab and asked to give 3 choices for referral.  Also instructed to have family or friends to visit and help w/decision.  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    BARRIERS:   R sided weakness-SNF for Rehab   Limited family support    NEEDS   SNF    PLAN:   SNF-matching list given, awaiting preference.     DCP to follow/assist w/disch placement       Odester Nilson A. Helene Kelp, RN MSN  Case Management  Ph: [phone]  Fax: [phone]

## 2016-11-30 ENCOUNTER — Encounter: Payer: Self-pay | Admitting: Interventional Cardiology

## 2016-11-30 ENCOUNTER — Inpatient Hospital Stay: Payer: Medicare Other

## 2016-11-30 LAB — CBC AND DIFFERENTIAL
Basophils Absolute: 0 10*3/uL (ref 0.0–0.3)
Eosinophils Absolute: 0 10*3/uL (ref 0.0–0.8)
Hematocrit: 25.5 % — ABNORMAL LOW (ref 39.0–52.5)
Hemoglobin: 8 gm/dL — ABNORMAL LOW (ref 13.0–17.5)
Lymphocytes Absolute: 1.2 10*3/uL (ref 0.6–5.1)
Lymphocytes: 9 % — ABNORMAL LOW (ref 15.0–46.0)
MCH: 30 pg (ref 28–35)
MCHC: 32 gm/dL (ref 32–36)
MCV: 95 fL (ref 80–100)
MPV: 7.6 fL (ref 6.0–10.0)
Monocytes Absolute: 0.8 10*3/uL (ref 0.1–1.7)
Monocytes: 6 % (ref 3.0–15.0)
Neutrophils %: 85 % — ABNORMAL HIGH (ref 42.0–78.0)
Neutrophils Absolute: 11.1 10*3/uL — ABNORMAL HIGH (ref 1.7–8.6)
Nucleated RBC: 2 /100 WBCs (ref 0–10)
PLT CT: 189 10*3/uL (ref 130–440)
RBC: 2.69 10*6/uL — ABNORMAL LOW (ref 4.00–5.70)
RDW: 16.9 % — ABNORMAL HIGH (ref 11.0–14.0)
WBC: 13 10*3/uL — ABNORMAL HIGH (ref 4.0–11.0)

## 2016-11-30 LAB — VH DEXTROSE STICK GLUCOSE
Glucose POCT: 222 mg/dL — ABNORMAL HIGH (ref 71–99)
Glucose POCT: 212 mg/dL — ABNORMAL HIGH (ref 71–99)
Glucose POCT: 231 mg/dL — ABNORMAL HIGH (ref 71–99)
Glucose POCT: 201 mg/dL — ABNORMAL HIGH (ref 71–99)
Glucose POCT: 204 mg/dL — ABNORMAL HIGH (ref 71–99)
Glucose POCT: 184 mg/dL — ABNORMAL HIGH (ref 71–99)

## 2016-11-30 LAB — I-STAT CG4 ARTERIAL CARTRIDGE
BE, ISTAT: 3 mMol/L — ABNORMAL HIGH (ref <=2)
HCO3, ISTAT: 27.2 mMol/L (ref 20.0–29.0)
Lactic Acid I-Stat: 1.73 mMol/L (ref 0.50–2.10)
O2 Sat, %, ISTAT: 93 % — ABNORMAL LOW (ref 96–100)
PCO2, ISTAT: 40.6 mm Hg (ref 35.0–45.0)
PO2, ISTAT: 67 mm Hg — ABNORMAL LOW (ref 75–100)
Room Number I-Stat: 3548
TCO2 I-Stat: 28 mMol/L (ref 24–29)
i-STAT FIO2: 6044 %
i-STAT Liters Per Minute: 6 L/min
pH, ISTAT: 7.43 (ref 7.35–7.45)

## 2016-11-30 LAB — RENAL FUNCTION PANEL
Albumin: 2.3 gm/dL — ABNORMAL LOW (ref 3.5–5.0)
Anion Gap: 9.1 mMol/L (ref 7.0–18.0)
BUN / Creatinine Ratio: 19.7 Ratio (ref 10.0–30.0)
BUN: 28 mg/dL — ABNORMAL HIGH (ref 7–22)
CO2: 28.2 mMol/L (ref 20.0–30.0)
Calcium: 8.2 mg/dL — ABNORMAL LOW (ref 8.5–10.5)
Chloride: 106 mMol/L (ref 98–110)
Creatinine: 1.42 mg/dL — ABNORMAL HIGH (ref 0.80–1.30)
EGFR: 51 mL/min/{1.73_m2} — ABNORMAL LOW (ref 60–150)
Glucose: 170 mg/dL — ABNORMAL HIGH (ref 71–99)
Osmolality Calc: 287 mOsm/kg (ref 275–300)
Phosphorus: 2.9 mg/dL (ref 2.3–4.7)
Potassium: 4.3 mMol/L (ref 3.5–5.3)
Sodium: 139 mMol/L (ref 136–147)

## 2016-11-30 LAB — APTT
aPTT: 37.6 s — ABNORMAL HIGH (ref 24.0–34.0)
aPTT: 46.2 s — ABNORMAL HIGH (ref 24.0–34.0)

## 2016-11-30 LAB — VH APTT( HEPARIN INFUSION THERAPY): aPTT: 42.9 s — ABNORMAL HIGH (ref 24.0–34.0)

## 2016-11-30 LAB — MAGNESIUM: Magnesium: 2.2 mg/dL (ref 1.6–2.6)

## 2016-11-30 MED ORDER — HYDROCODONE-ACETAMINOPHEN 5-325 MG PO TABS
1.0000 | ORAL_TABLET | ORAL | Status: DC | PRN
Start: 2016-11-30 — End: 2016-12-01
  Administered 2016-11-30: 1 via ORAL
  Administered 2016-11-30 – 2016-12-01 (×4): 2 via ORAL
  Filled 2016-11-30: qty 2
  Filled 2016-11-30: qty 1
  Filled 2016-11-30 (×3): qty 2

## 2016-11-30 MED ORDER — BELLADONNA ALKALOIDS-OPIUM 16.2-60 MG RE SUPP
30.0000 mg | Freq: Two times a day (BID) | RECTAL | Status: DC | PRN
Start: 2016-11-30 — End: 2016-12-09
  Administered 2016-11-30 – 2016-12-04 (×5): 30 mg via RECTAL
  Filled 2016-11-30 (×6): qty 1

## 2016-11-30 MED ORDER — LABETALOL HCL 200 MG PO TABS
200.0000 mg | ORAL_TABLET | Freq: Two times a day (BID) | ORAL | Status: DC
Start: 2016-11-30 — End: 2016-12-09
  Administered 2016-11-30 – 2016-12-09 (×18): 200 mg via ORAL
  Filled 2016-11-30 (×19): qty 1

## 2016-11-30 NOTE — Progress Notes (Signed)
[place] [id number]     PROGRESS NOTE    Case Management       Inpatient Plan of Care:      POD # 8 hybrid left atrial ablationw/resection of portion of xyphoid, AKI,    Pericardiocentesis 4/19 w/700cc removed. Additional 147 after drain placed- drain removed 4/22   CT for large effusion-cont.   A-fib on Amiodarone po & Labetolol po (keep BP on low side)   Nephro following-Creatinine 4.64   CVA-w/right sided weakness followed by Neuro-   PT/OT working w/pt, up in chair-recom SNF   Nephro following-sCR slowly improving.   PCA-Morphine for pain-basal rate [place]'d w/dec in max to 3 dose/hr and LO inc to 22min.   Transfer out of [facility] Today.   Serial monitoring/exams   Supportive Care   CM Interventions:      Chart reviewed, spoke w/pt while working w/PT.  Aware that he will need SNF for rehab.  Does not have any idea.  Matching list given and awaiting his preference.  Asked him to choose 3.    BARRIERS:   R sided weakness     NEEDS   ?MRI in future     PLAN:   SNF     DCP to follow/assist w/disch placement       Chizara Mena A. Helene Kelp, RN MSN  Case Management  Ph: [phone]  Fax: [phone]

## 2016-11-30 NOTE — Progress Notes (Signed)
[place] DAILY PROGRESS NOTE      [name]  Attending Physician: [name], MD    [name] REVIEWED INFORMATION BELOW, DISCUSSED RELEVANT POINTS WITH PATIENTS AND PERSONALLY DEVELOPED PLAN OF CARE.    Assessment/Plan:   POD # 8  S/p hybrid left ablation   Pt was restarted on heparin drip last night for afib.   BP 105/63   Pulse 99   Temp 97.5 F (36.4 C) (Oral)   Resp 16   Ht 1.702 m (5\' 7" )   Wt 85.3 kg (188 lb 0.8 oz)   SpO2 100%   BMI 29.45 kg/m   Pt at dry weight. 85kg  I/O: negative 56 24 hours   Cardiac: Afib, heparin restarted by Dr. [name]. 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 We will keep blood pressures low, on PO labetalol. Will need to follow up on this before patient leaves.   Respiratory: CXR: Stable small left pleural effusion with left basilar atelectasis or infiltrate. Chest tube output: 118ml last shift  GI: pt eating and drinking, cardiac diet  Heme: WBC 13, H/H 8, 25.5 PLT 189  Renal: creatinine 1.42 improving   Magnesium pending  Neuro: may need MRI in future     Dispo: Transfer to floor today, monitor chest tube output, pain management consult, encourage ambulation and IS use, begin  planning    Subjective     No complaints, pt was sleeping. Denies pain currently. On morphine PCA    Vital Signs:   Vitals:  Temp: 97.5 F (36.4 C)  Heart Rate: 99  BP: 105/63  SpO2: 100 %  O2 Flow Rate (L/min): 4 L/min    Pre-Op Weight: 85.3 kg (188 lb) (11/15/16 1033)  Post-Op Weight: 85.3 kg (188 lb 0.8 oz) (11/30/16 0600)    Recent Labs  Lab 11/30/16  0338 11/29/16  2105 11/29/16  1110 11/29/16  0822 11/29/16  0408   Glucose, POCT 222* 197* 172* 135* 171*         Intake/Output Summary (Last 24 hours) at 11/30/16 0720  Last data filed at 11/30/16 0630   Gross per 24 hour   Intake          2760.88 ml   Output             2775 ml   Net           -14.12 ml       Physical Exam:     General: awake, alert, appears ill  Neck:  supple  Cardiovascular: afib rate 83, normal S1,S2 no murmurs, rubs or gallops. Incisions c/d/I  Lungs:  Decreased breath sounds bilaterally, normal respiratory effort. Left chest tube in place.   Abdomen:  normoactive bowel sounds, soft, non-tender, non-distended  Extremities: no clubbing, cyanosis, 1+ edema bilateral ankles   Neuro: Cranial nerves II-XII are grossly intact. Pt moves right arm slowly but grip strength is 5/5 and symmetric. Moving lower extremities symmetrically. Speech intact.     Meds:     Current Facility-Administered Medications   Medication Dose Route Frequency   . albuterol-ipratropium  3 mL Nebulization Q6H [facility]   . amiodarone  200 mg Oral BID   . divalproex EC/DR tablet  500 mg Oral Q12H [place]   . docusate sodium  100 mg Oral Daily   . furosemide  40 mg Intravenous BID   . gabapentin  100 mg Oral Q12H [place]   . insulin glargine  10 Units Subcutaneous Q12H  [place]   . labetalol  200 mg Oral Q12H [place]   . pantoprazole  40 mg Oral BID AC   . polyethylene glycol  17 g Oral Daily   . pravastatin  40 mg Oral QPM   . senna  8.6 mg Oral QHS     Current Facility-Administered Medications   Medication Dose Route Frequency Last Rate   . heparin infusion - MAR calculator by aPTT  0-3,500 Units/hr Intravenous Titrated 1,100 Units/hr (11/30/16 0210)   . labetalol (NORMODYNE) infusion 300 mg/300 mL NS  2-6 mg/min Intravenous Continuous Stopped (11/29/16 2231)   . morphine   Intravenous Continuous       Current Facility-Administered Medications   Medication Dose Route   . sodium chloride   Intravenous   . albuterol  2.5 mg Nebulization   . dextrose  125 mL Intravenous   . glucagon (rDNA)  1 mg Intramuscular   . heparin (porcine)  2,000-4,000 Units Intravenous   . insulin aspart  2-24 Units Subcutaneous   . naloxone  0.4 mg Intravenous   . ondansetron  4 mg Oral    Or   . ondansetron  4 mg Intravenous   . promethazine  25 mg Oral    Or   . promethazine  12.5 mg Rectal    Or   . promethazine  6.25 mg Intramuscular   .  traMADol  50 mg Oral             Labs:       Recent Labs  Lab 11/30/16  0130 11/29/16  0504 11/28/16  0116 11/27/16  1205 11/26/16  2240  11/25/16  1120   Sodium 139 139 139 137 135* More results in Results Review 134*   Potassium 4.3 4.7 4.5 4.3 3.8 More results in Results Review 6.4*   Chloride 106 106 103 101 100 More results in Results Review 104   CO2 28.2 30.3* 32.1* 29.6 27.2 More results in Results Review 15.6*   BUN 28* 25* 36* 53* 75* More results in Results Review 85*   Creatinine 1.42* 1.35* 1.77* 2.34* 3.25* More results in Results Review 4.21*   EGFR 51* 54* 39* 28* 19* More results in Results Review 14*   Calcium 8.2* 8.4* 8.5 8.4* 8.3* More results in Results Review 9.4   Magnesium  --   --   --   --   --   --  2.9*   More results in Results Review = values in this interval not displayed.    Recent Labs  Lab 11/30/16  0130 11/29/16  1937 11/29/16  0504   WBC 13.0* 13.1* 13.4*   RBC 2.69* 2.94* 2.66*   Hemoglobin 8.0* 8.7* 8.0*   Hematocrit 25.5* 27.7* 24.8*   MCV 95 94 93   PLT CT 189 210 192       Recent Labs  Lab 11/29/16  1937 11/27/16  1205   PT 13.0* 14.1*   PT INR 1.3 1.4*       Recent Labs  Lab 11/26/16  0302   Cholesterol 70*   Triglycerides 78   HDL 21*   LDL Calculated 33       Recent Labs  Lab 11/30/16  0130  11/28/16  1627  11/25/16  0454   Bilirubin, Total  --   --   --   --  1.4*   Protein, Total  --   --  6.1  --  6.6   Albumin 2.3*  More results in Results Review  --  More results in Results Review 3.3*   ALT  --   --   --   --  78*   AST (SGOT)  --   --   --   --  83*   More results in Results Review = values in this interval not displayed.    Radiology:     Radiology Results (24 Hour)     Procedure Component Value Units Date/Time    XR Chest AP Portable [[id number]] Collected:  11/30/16 0400    Order Status:  Completed Updated:  11/30/16 0401    Narrative:       HISTORY:Reason For Exam: resp distress  Respiratory distress, left sided chest pain     COMPARISON: November 29, 2016    TECHNIQUE: XR CHEST AP PORTABLE     FINDINGS:    Lines and tubes: Left chest tube is present and unchanged.    Lungs and hila: Small left pleural effusion with left basilar atelectasis or consolidation is stable. The right lung is clear. There is no pneumothorax.    Heart and Mediastinum: The cardiac silhouette is enlarged and stable in size.     Bones and soft tissues: No acute abnormality.    Upper abdomen: Normal      Impression:       Stable small left pleural effusion with left basilar atelectasis or infiltrate.            ReadingStation:[id number]    XR Chest AP Portable [[id number]] Collected:  11/29/16 0954    Order Status:  Completed Updated:  11/29/16 0957    Narrative:       Clinical History:  Reason For Exam: Evaluate left effusion  History of cardiac ablation and COPD.    Examination:  Frontal view of the chest.    Comparison:  November 28, 2016    Findings:  There is persistent cardiomegaly. Left apical chest tube appears unchanged in position. There is no appreciable pneumothorax. Hazy opacification of the left base likely represents a combination of atelectasis and a small volume of pleural fluid. The   right lung appears clear. The regional osseous structures are unchanged in appearance.      Impression:       1.  Left apical chest tube in place without appreciable pneumothorax.  2.  Hazy opacification of the left base likely represents a combination of atelectasis and a small amount of pleural fluid.  3.  Unchanged cardiomegaly.    ReadingStation:[id number]          Leanor Kail, PA  Date: November 30, 2016  Time: 7:20 AM    Please feel free to contact me or [name] for any further questions or concerns.    [facility]  [phone]

## 2016-11-30 NOTE — Progress Notes (Signed)
Bedside report given to dayshift RN. VSS.

## 2016-11-30 NOTE — Progress Notes (Signed)
Renal Progress Note    Follow up for:  ARF  CKD, stage 3    Subjective:     Patient being moved to medical floor today  Patient is still concerned about limited mobility and scrotal swelling and hence is asking to leave the Foley catheter in place  No other new complaints to me      Objective:     Vitals: BP 136/84   Pulse 81   Temp 97.9 F (36.6 C) (Oral)   Resp (!) 28   Ht 1.702 m (5\' 7" )   Wt 85.3 kg (188 lb 0.8 oz)   SpO2 97%   BMI 29.45 kg/m       I/O:     Intake/Output Summary (Last 24 hours) at 11/30/16 1346  Last data filed at 11/30/16 1044   Gross per 24 hour   Intake          1583.48 ml   Output             3575 ml   Net         -1991.52 ml       Weights:  Wt Readings from Last 1 Encounters:   11/30/16 0600 85.3 kg (188 lb 0.8 oz)   11/29/16 0352 85.4 kg (188 lb 4.4 oz)   11/27/16 0553 88.1 kg (194 lb 3.6 oz)   11/26/16 0525 87.8 kg (193 lb 9 oz)   11/25/16 0540 86.3 kg (190 lb 4.8 oz)   11/24/16 0549 86.7 kg (191 lb 2.2 oz)   11/22/16 0608 85.6 kg (188 lb 11.4 oz)   11/15/16 1033 85.3 kg (188 lb)       Physical Exam:  General: No distress.  Lungs: CTA  Heart: RRR, no murmur, gallop, rub, or heave.  Abdomen: BS present, soft, NT/ND, no organomegaly.  Extremities: 1+ edema.  Access: N/A        Labs:  Labs reviewed.  Radiology results reviewed.  Pertinent findings below.  Recent Labs      11/30/16   0130  11/29/16   1937   WBC  13.0*  13.1*   Hemoglobin  8.0*  8.7*   Hematocrit  25.5*  27.7*   PLT CT  189  210     Recent Labs      11/30/16   0824  11/30/16   0130  11/29/16   1937   PT   --    --   13.0*   PT INR   --    --   1.3   aPTT  46.2*  37.6*  29.1     No results for input(s): TROPI, CK, CKMBINDEX in the last 72 hours.  No results for input(s): BNP in the last 72 hours.        Xr Chest Ap Portable    Result Date: 11/30/2016  Stable small left pleural effusion with left basilar atelectasis or infiltrate. ReadingStation:[id number]     Recent Labs      11/30/16   0130  11/29/16   0504   11/28/16   0116   Glucose  170*  162*  210*   Sodium  139  139  139   Potassium  4.3  4.7  4.5   Chloride  106  106  103   CO2  28.2  30.3*  32.1*   BUN  28*  25*  36*   Creatinine  1.42*  1.35*  1.77*   EGFR  51*  54*  39*   Calcium  8.2*  8.4*  8.5     Recent Labs      11/30/16   0824  11/30/16   0130  11/29/16   0504  11/28/16   0116   Magnesium  2.2   --    --    --    Phosphorus   --   2.9  2.8  2.7     Recent Labs      11/30/16   0130  11/29/16   0504  11/28/16   1627  11/28/16   0116   Albumin  2.3*  2.4*   --   2.6*   Protein, Total   --    --   6.1   --     No results for input(s): SGUR, LABPH, PROTEINUR, GLUUA, KETONESUA, BILIUA, BLOODUA, NITRITEUA, UROBILIUA, LEUA, NITRITEUA, WBCUA, RBCUA, BACTERIAUA in the last 72 hours.    Invalid input(s):  AMORPHOUSUA               Assessment:     1. AKI - pre-renal ischemia +/- ATN resulting from pericardial effusion; peak creatinine 4.64 this morning, now improving steadily  2. Stage III chronic kidney disease-with apparent baseline creatinine 1.6-2.1 mg/dL; most recent creatinine value on outpatient basis had been 2.0 mg/dL on 11/15/16. Suspect underlying diabetic/hypertensive kidney disease.  3. Hyperkalemia-associated with reduced renal function and ACE inhibitor use; also component of hyperglycemia  4. Metabolic acidosis-associated with reduced renal function, resolved  5. Ileus, improved  6. Status post hybrid left atrial ablation (convergent procedure) for atrial fibrillation  7. Hypertension  8. Subacute CVA of left frontoparietal lobes  9. Type 2 diabetes mellitus      Plan:      sCr continues to improve steadily and now better than previous baseline possibly owing to volume expansion   Continue to diurese; creatinine may increase modestly as he approaches dry weight; will accept return to previous baseline   Continue with daily chemistries for now      Orders and Medications reviewed in Epic.  Case reviewed with RN    Blanchard Mane, Pager [phone]  1:46  PM  11/30/2016  [facility]  Dr. Hurley Cisco, Dr. Josephina Shih, Dr. Fernande Boyden  Office: [phone]

## 2016-11-30 NOTE — Progress Notes (Signed)
Cardiology Progress Note      Date Time: 11/30/16 5:39 PM  [name], [name]      Assessment:     Active Hospital Problems    Diagnosis   . Chronic anticoagulation   . Stroke   . CKD (chronic kidney disease)   . Former smoker, stopped smoking many years ago   . Type 2 diabetes mellitus   . HTN (hypertension)   . HLD (hyperlipidemia)   . Diabetic neuropathy   . COPD (chronic obstructive pulmonary disease)   . Bipolar disorder   . BPH (benign prostatic hyperplasia)   . Atrial fibrillation   . Chronic atrial fibrillation   . Stage 4 chronic kidney disease             Plan:     1. He feels better, renal function improved.   2. Diffuse swelling (predominantly lower extremity). Defer diuretic decisions to Dr. Fernande Boyden  3. Ileus - resolved.   4. Type B aortic dissection - chronic.   5. Atrial fibrillation - Plan for TEE guided cardioversion once stability of pleural effusion and pericardial effusion is confirmed. Continue amiodarone.       Subjective:   Feels better, discussed plan of care with son-in-law as well as patient.     Physical Exam:     Temp:  [97.5 F (36.4 C)-98.8 F (37.1 C)] 97.5 F (36.4 C)  Heart Rate:  [80-99] 91  Resp Rate:  [13-35] 18  BP: (97-143)/(61-92) 130/87    Wt Readings from Last 3 Encounters:   11/30/16 85.3 kg (188 lb 0.8 oz)   10/21/16 85.5 kg (188 lb 9.6 oz)   10/07/16 84 kg (185 lb 3.2 oz)            Intake/Output Summary (Last 24 hours) at 11/30/16 1739  Last data filed at 11/30/16 1608   Gross per 24 hour   Intake          1089.13 ml   Output             3825 ml   Net         -2735.87 ml       General appearance - alert, well appearing, and in no distress  Chest -clear to auscultation, no wheezes, rales or rhonchi, symmetric air entry  Heart - irregularly irregular rhythm  Extremities - 3+ lower extremity edema  Abd: soft. Non-tender, non-distended,  Neuro: alert and oriented  Skin: no rashes  Psych: normal affect    Medications:     Scheduled Meds:    albuterol-ipratropium 3 mL Nebulization Q6H [place]   amiodarone 200 mg Oral BID   divalproex EC/DR tablet 500 mg Oral Q12H [facility]   docusate sodium 100 mg Oral Daily   furosemide 40 mg Intravenous BID   insulin glargine 10 Units Subcutaneous Q12H [facility]   labetalol 200 mg Oral Q12H [facility]   pantoprazole 40 mg Oral BID AC   polyethylene glycol 17 g Oral Daily   pravastatin 40 mg Oral QPM   senna 8.6 mg Oral QHS       . heparin infusion - MAR calculator by aPTT 1,200 Units/hr (11/30/16 1042)         Labs:       Recent Labs  Lab 11/30/16  0824 11/30/16  0130 11/29/16  0504 11/28/16  0116  11/25/16  0454   Glucose  --  170* 162* 210* More results in Results Review 230*   BUN  --  28* 25* 36* More results in Results Review 75*   Creatinine  --  1.42* 1.35* 1.77* More results in Results Review 3.79*   Sodium  --  139 139 139 More results in Results Review 133*   Potassium  --  4.3 4.7 4.5 More results in Results Review 6.3*   Chloride  --  106 106 103 More results in Results Review 102   CO2  --  28.2 30.3* 32.1* More results in Results Review 19.4*   Magnesium 2.2  --   --   --  More results in Results Review  --    AST (SGOT)  --   --   --   --   --  83*   ALT  --   --   --   --   --  78*   More results in Results Review = values in this interval not displayed.  Estimated Creatinine Clearance: 52.7 mL/min (A) (based on SCr of 1.42 mg/dL (H)).      Recent Labs  Lab 11/26/16  0302   CHOL 70*   TRIG 78   HDL 21*   LDL 33         Recent Labs  Lab 11/30/16  0130 11/29/16  1937 11/29/16  0504  11/27/16  1205   WBC 13.0* 13.1* 13.4* More results in Results Review 14.4*   Hemoglobin 8.0* 8.7* 8.0* More results in Results Review 8.4*   Hematocrit 25.5* 27.7* 24.8* More results in Results Review 26.1*   PLT CT 189 210 192 More results in Results Review 191   PT INR  --  1.3  --   --  1.4*   More results in Results Review = values in this interval not displayed.    No results for input(s): TROPI, CK, CKMB in the last 8760 hours.      Recent  Labs  Lab 11/26/16  0302   TSH 1.81       Radiology: all results from this admission  Xr Chest 2 Views    Result Date: 11/15/2016  Cardiomegaly with cephalization of pulmonary vascular flow. No focal infiltrate, pleural effusion, or pneumothorax. ReadingStation:[email or web address]    Xr Abdomen Ap    Result Date: 11/24/2016  Findings worrisome for partial proximal or mid small bowel obstruction. ReadingStation:[email or web address]    Ct Head Wo Contrast    Result Date: 11/24/2016  1. No evidence of intracranial hemorrhage. 2. Probable subacute infarct superior LEFT frontoparietal lobe gyrus. 3. If clinically indicated, MRI may be helpful in further evaluation. ReadingStation:[email or web address]    Ct Chest Wo Contrast    Result Date: 11/28/2016  1.  DESCENDING THORACIC AORTIC DISSECTION. 2.  LARGE LEFT EFFUSION WITH COMPLETE LEFT LOWER LOBE COLLAPSE. 3.  CARDIOMEGALY WITH PERICARDIAL FLUID. 4.  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 ReadingStation:[email or web address]    Xr Chest Ap Only    Result Date: 11/22/2016  No pneumothorax or significant acute pulmonary abnormalities. Small amount of subcutaneous emphysema in the anterior chest. ReadingStation:[email or web address]    Xr Chest Ap Portable    Result Date: 11/30/2016  Stable small left pleural effusion with left basilar atelectasis or infiltrate. ReadingStation:[id number]    Xr Chest Ap Portable    Result Date: 11/29/2016  1.  Left apical chest tube in place without appreciable pneumothorax. 2.  Hazy opacification of the left base likely represents a combination of atelectasis and a small amount of pleural fluid. 3.  Unchanged cardiomegaly. ReadingStation:[email or web address]    Xr Chest  Ap Portable    Result Date: 11/28/2016  1.  NO PNEUMOTHORAX AFTER CHEST TUBE PLACEMENT. 2.  LEFT EFFUSION NEARLY COMPLETELY RESOLVED. ReadingStation:[email or web address]    Xr Chest Ap Portable    Result Date: 11/28/2016  Increase left effusion and worsening airspace disease left lower lung. ReadingStation:[email or web address]    Xr Chest  Ap Portable    Result Date: 11/26/2016  Stable pulmonary vascular congestion and left basilar atelectasis or infiltrate. Probable small left pleural effusion is unchanged. ReadingStation:[id number]    Xr Chest Ap Portable    Result Date: 11/24/2016  Cardiomegaly with bilateral airspace disease or edema with small left effusion. No observed pneumothorax. Interval removal right IJ central line. ReadingStation:[email or web address]    X-ray Chest Ap Portable    Result Date: 11/23/2016  1. Pulmonary vascular congestion without overt failure 2. Left basilar opacity likely combination of atelectasis, consolidation and small effusion. 3. Catheter appliances as above ReadingStation:[email or web address]    Xr Abdomen Portable    Result Date: 11/25/2016  No acute abnormality. [place], DO

## 2016-11-30 NOTE — OT Progress Note (Signed)
VHS: [facility]  Department of Rehabilitation Services: [phone]    Dallyn Bergland    CSN#: [id number]  SURG TELEMETRY STEP-DOWN 330/330-A    Occupational Therapy Progress Note    Patient's medical condition is appropriate for Occupational therapy intervention at this time.    Time of treatment:   Time Calculation  OT Received On: 11/30/16  Start Time: 0902  Stop Time: 0924  Time Calculation (min): 22 min    Visit#: 2    Medical Diagnosis/Pertinent medical/surgical details: s/p hybrid convergent atrial ablation procedure on 11/22/2016.On 11/25/2016, patient transferred to ICU with increasing dyspnea and worsening renal function due to large pericardial effusion noted on echo. 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 Patient requiring moderate assistance x2 for supine to sit transition and minimal assistance x2 for sit to stand transition and moderate assistance x2 for transfer to wheelchair with hand held assistance. Patient reporting chest pain and preoccupied with placement of foley catheter during session. Will continue to attempt to address mobility and self-care tasks.     Patient continues to have the following impairments: decreased ROM, decreased strength, balance deficits, decreased independence with ADLs, decreased independence with IADLs, decreased activity tolerance, decrease safety awareness, decreased attention, decreased cognition, decreased problem solving, decreased functional mobility, decreased functional  transfers    Patient will continue to benefit from skilled OT services in order to increase independence and safety with ADLs and functional transfers/mobility.        Goals:   In 2 to 3 visits:  1. Patient will donn/doff socks with Moderate assist and use of AE/AT prn. ONGOING  2. Patient will thread pants/undergarments over knees with Moderate assist and use of AE/AT/AD prn. ONGOING  3. Patient will stand and arrange pants/undergarments over hips with Moderate assist and use of AE/AT/AD prn.  ONGOING  4. Patient will donn/doff shirt/gown with Minimal assist and use of AT/AE prn. ONGOING  5. Patient will perform BSC/chair transfer with Supervision and use of AD/DME prn. ONGOING  6. Patient will perform toilet transfer with Minimal assist and use of AD/DME prn. ONGOING  9. Patient will perform 2 grooming tasks with Minimal assist while standing and use of AE/AT prn. ONGOING    LTG (by d/c):   1. Patient will be Supervision Minimal assist for ADLs, Supervision for functional transfers/mobility with use of AD/DME/AT/AE. ONGOING      Plan:   Treatment/interventions: ADL retraining, functional transfer training, UE strengthening/ROM, activity pacing, cognition (safety awareness, problem solving, etc.), patient/family training, equipment eval/education, compensatory technique education    Treatment Frequency: OT Frequency Recommended: 4-5x/wk     DISCHARGE RECOMMENDATIONS   DME recommended for Discharge:   TBD closer to d/c  TBD at next level of care    Discharge Recommendations:   SNF         Subjective:   "This tube needs to be under my leg so it can drain"     "I like to talk, if I talk I'm in control of the medicine and what we  do here"     Patient is agreeable to participation in the therapy session. Nursing clears patient for therapy.    Pain:  At Rest: 6 /10  With Activity: 6/10  Location: Generalized  Interventions: Repositioned, Rest    Objective:   Observation of Patient:    Patient is in bed with dressings,  Bed/chair alarm on, telemetry, continuous pulse oximeter, SCD's, peripheral IV, O2 via nasal cannula, catheter, and chest tube.             Vital Signs:   BP Supine:  119/75 mmHg  BP Sitting: 154/68 mmHg  HR Supine: 101 bpm  HR Sitting:  81 bpm  SpO2 at rest: 100%  SpO2 with activity: 94%     Oriented to: Oriented x4  Command following: Follows 1 step commands with increased time, Follows 1 step commands with repetition  Alertness/Arousal: Delayed responses to stimuli   Attention Span:Attends to task with redirection, Difficulty attending to directions  Memory: Appears intact  Safety Awareness: minimal verbal instruction  Insights: Decreased awareness of deficits  Problem Solving: moderate assistance  Behavior: Cooperative, Distractable  Cognitive Deficits: insight    Balance:  Static Sitting:  Good  Dynamic Sitting:  Good  Static Standing:  Fair-  Dynamic Standing:  Fair-    Activities of Daily Living:   Comments: Not addressed due to fatigue during transfer     Functional Mobility:   Bed Mobility:  Supine to Sit:   Maximal assist (assist x2 for safety).   assist at trunk, assist at LE(s)  Seated Scooting:   Minimal assist    Transfers:  Sit to Stand:  Minimal assist with Hand held assist.   Cues for Hand Placement  Stand to Sit:  Minimal assist.   Cues for Hand Placement  Stand Pivot:   Minimal assist (assist x2 for safety) with Hand held assist.        Chair: Minimal assist (assist x2 for safety) with Hand held assist.          Participation and Activity Tolerance   Participation effort: Good  Activity Tolerance: Tolerates 10-20 minutes of activity with multiple rests    Other Treatment Interventions:   Treatment Activities:   Not applicable          Education Provided:   Topics: Role of occupational therapy, plan of care, goals of therapy and safety with mobility and ADLs, benefits of activity, bed mobility with use of adaptive equipment or strategy, activity with nursing.    Individuals educated:  Patient.  Method: Explanation.  Response to education: Needs reinforcement.    Team Communication:   OT communicated with: RN/LPN - Bland Span, RNCM/SW Kieth Brightly, Sweeny with PT  OT communicated regarding: Pre-session re: patient status, Patient position at end of session, Discharge needs, Patient participation with Therapy, Vital signs, Further recommendations  OT/COTA communication: via written note and verbal communication as needed.    Sitting and in wheelchair in room with RN present    Recommend client be assisted to chair for all meals outside of and in addition to OT session.    [place] Daianna Vasques, OTR/L

## 2016-11-30 NOTE — Progress Notes (Signed)
Patients VSS, report given to floor nurse and care transferred.

## 2016-11-30 NOTE — PT Progress Note (Signed)
______________________________________________________________________    I have reviewed and agree with the documentation authored by my student of the evaluation/ treatment/education/care plan delivered during my direct supervision.     Co-Signed by:  Jillyn Hidden, PT  ______________________________________________________________________    VHS: [facility]  Department of Rehabilitation Services: [phone]  Harol Shabazz [facility]    CSN: [id number]    SURG TELEMETRY STEP-DOWN   330/330-A    Physical Therapy Treatment Note    Time of treatment:   Time Calculation  PT Received On: 11/30/16  Start Time: 0902  Stop Time: 0923  Time Calculation (min): 21 min    Visit#: 3    Last seen by Physical therapist vs. PTA: 11/30/16    Medical Diagnosis/Pertinent medical/surgical details: hybrid convergent atrial ablation procedure on 11/22/16. On 11/25/16, the patient was transferred to the ICU with shortness of breath. The patient had a large pericardial effusion drained on 11/25/16. The patient is also presenting with right sided weakness, particularly on the right upper extremity. 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      Precautions and Contraindications:  Falls  Mobility protocol   Pericardial drain in place on left upper chest  SBP between 120-160    Assessment:   Patient's progress towards established goals: the patient made no progress towards his goals today. The patient completed bed mobility with maximal assist and sit to stand transfer with minimal assist. He also completed a stand pivot transfer to a wheelchair with moderate assistance x 2. The patient continued to demonstrate confusion during today's session, but had decreased response delay compared to last session.    Patient continues to have the following impairments: decreased ROM, decreased strength, decreased safety/judgement during functional mobility, decreased activity  tolerance, impaired coordination, impaired motor control , decreased functional mobility, decreased balance, gait deficits, pain    Patient will continue to benefit from skilled PT services in order to address the above limitations and return to a higher level of function.       Goals:   To be completed by discharge:  1. The patient will complete all bed mobility with supervision in order to prepare to transfer. ONGOING  2. The patient will complete sit to stand and stand to sit transfers with supervision in order to prepare to ambulate. ONGOING  3. The patient will ambulate 150 feet with supervision with LRAD in order to demonstrate independence with mobility. ONGOING  4. The patient will ascend/descend 7 stairs with 2 rails with supervision in order to demonstrate the ability to get into his home. ONGOING     Plan:   Treatment/interventions: Exercise, Gait training, Neuromuscular re-education, Functional transfer training, LE strengthening/ROM, Patient/caregiver training, Bed mobility    Treatment Frequency: 4-5x/wk    DISCHARGE RECOMMENDATIONS   DME recommended for Discharge:   TBD at next level of care    Discharge Recommendations:   SNF         Subjective:   "Medications take longer to work on me..so if I control the conversation then I control the medication" the patient reported today after hitting his PCA.   Patient is agreeable to participation in the therapy session.    Pain:  At Rest: 6 /10  With Activity: 6/10  Location: Chest    Interventions: Medication (see eMAR)    OBJECTIVE:   Observation of Patient/Vital Signs:   Patient is in bed with dressings, Bed/chair alarm on, telemetry, continuous pulse  oximeter, SCD's, catheter, chest tube, peripheral IV, 3.5L O2  Patient's medical condition is appropriate for Physical therapy intervention at this time.    Vital Signs:  BP Supine:  94/61 mmHg  BP after activity: 119/75 mmHg  HR Supine: 88 bpm  SpO2 at rest: 98%  on 3.5L supplemental O2           Oriented  to: Person, Place  and Situation  Command following: Follows 1 step commands with increased time  Attention Span:Attends to task with redirection  Insights: Decreased awareness of deficits    Musculoskeletal and Balance Details:   Balance:  Static Sitting:  Good  Dynamic Sitting:  Good  Static Standing:  Fair patient required minimal assist to maintain standing balance  Dynamic Standing:  Fair- patient required minimal assist x 2 to maintain balance during stand pivot transfer          Bed Mobility:   Supine to Sit:   Maximal assist (assist x2 for safety).   Cues for Sequencing., assist at trunk, assist at LE(s)  Seated Scooting:   Minimal assist    Transfers:  Sit to Stand:  Minimal assist with Hand held assist.    Cues for Sequencing  Stand to Sit:  Minimal assist.    Cues for Hand Placement  Stand Pivot:   Minimal assist (assist x2 for safety) with Hand held assist.   Cues for Sequencing, Cues for Hand Placement, Cues for Foot Placement    Locomotion:  Not tested due to poor standing balance    Participation and Activity Tolerance   Participation effort: Good  Activity Tolerance: Tolerates 10-20 minutes of activity with multiple rests    Other Treatment Interventions this session:   Therapeutic activity: bed mobility and functional transfer training    Education Provided:   TOPICS: role of physical therapy, plan of care, goals of therapy and benefits of activity    Learner educated: Patient  Method: Explanation  Response to education: Verbalized understanding    Patient Position at End of Treatment:   Sitting, Needs in reach, No distress and in a wheelchair    Team Communication:   Spoke to : RN/LPN - Bland Span   Regarding: Pre-session re: patient status, Patient position at end of session, Patient participation with Therapy  Whiteboard updated: No  PT/PTA communication: via written note and verbal communication as needed.      Recommend patient be repositioned every two hours, SPT to chair with mod Ax2 outside of  PT sessions.    Dwan Bolt, SPT

## 2016-11-30 NOTE — Progress Notes (Addendum)
0325: Pt's oxygen sats down to 84% on 3 L NC. NC increased to 6L with little improvement in sats. Pt complaining of L sided chest pain. Pt re educated on morphine PCA, but no improvement after PCA dose given. Left CT intact with minimal drainage. Breath sounds remained unchanged from previous assessment. RR 30s. eICU notified. Order for abg and cxr received. PT placed on oxymask. Will continue to monitor closely.     0350: Pt's O2 sats 99% on oxymask. RR back into the 20s. CXR completed. Will monitor.

## 2016-11-30 NOTE — Consults (Signed)
Inpatient consult to Pain Management  Consult performed by: Gilford Raid  Consult ordered by: Leanor Kail  Reason for consult: Postoperative pain post hybrid ablation/on Morphine PCA      [facility] PAIN MANAGEMENT SERVICE CONSULTATION  Requesting provider:  Durene Cal, PA    Consulting Provider: Alberteen Sam DNP, FNP-BC, AP-PMN    Chief Complaint:  Post procedural pain, on PCA morphine, Bladder spasm, BPH pain, neuropathy of R hand/first    Assessment:    1.  Urethral spasm/BPH pain - chronic  2.  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  OA pain - bilateral knees    Plan / Recommendations:   1.  D/c morphine PCA and convert to oral opioid analgesics as patient is now able to keep po/fluids in, plus morphine tends to build-up as metabolites in present of AKI. Hydrocodone low dose prn to start.  2.  B and O suppository for bladder spasm (which is more an issue today)  3.  Continue with tramadol prn as he takes this at home for OA pain.  4.  Discontinue gabapentin as this can contribute to peripheral/generalized edema.  5. Monitor sedation closely.    History of Present Illness:     [name] is a 66 y.o. male, admitted with  11/22/2016 with Atrial fibrillation, unspecified type [I48.91].  Post procedure, patient was complicated with CVA, AKI, pleural effusion, and pericarditis with pericardial effusion requiring pericardial drainage. He was stabilized in ICU and was given morphine PCA for pain control. However, he apparently was with increased sedation, despite on low basal and on-demand doses. He was transferred to floor with Morphine PCA.  Presently, patient reports mainly bladder spasm pain, that comes and goes frequently, which he associates with his BPH. Rates it at 8-10/10 with it comes, goes down to a minimal intensity at rest. Has had it before, but it has been worse after this admission.  He takes flomax for BPH. Also complains of R thumb/index/middle finger pain,  which he describes as burning, intermittent, tingling pain (chronic) and OA knee pain.These sites are mild compared to the bladder spasm pain.   He admits to taking gabapentin for the neuropathy and tramadol for OA pain. Has been on these meds off and on. Denies paresthesia of the RUE above the hand/wrist. Denies paresthesia of toes/feet or L hand.     Past Medical History:     Past Medical History:   Diagnosis Date   . Arrhythmia    . Arthritis    . Atrial fibrillation    . Atrial flutter    . Bipolar affective    . BPH (benign prostatic hyperplasia)    . Complication of anesthesia     resp. asessment   . Diabetes mellitus    . Diabetic neuropathy    . Gout    . Heart murmur    . HOH (hard of hearing)    . Hyperlipidemia    . Hypertension    . Paroxysmal atrial fibrillation    . Pulmonary hypertension    . Renal insufficiency    . Type 2 diabetes mellitus, controlled    . Wears glasses      Past Surgical History:     Past Surgical History:   Procedure Laterality Date   . APPENDECTOMY     . CARDIAC ABLATION     . CARDIAC CATHETERIZATION     . CARDIOVERSION      x  2   . HERNIA REPAIR     . PERICARDIOCENTESIS Left 11/25/2016    Procedure: PERICARDIOCENTESIS;  Surgeon: [name], MD;  [facility] CATH[facility]EP;  Service: Cardiovascular;  Laterality: Left;   . TEE     . TONSILLECTOMY     . TONSILLECTOMY, ADENOIDECTOMY         Social History:     Social History   Substance Use Topics   . Smoking status: Former Smoker     Packs/day: 0.30     Years: 40.00     Types: Cigarettes     Quit date: 1995   . Smokeless tobacco: Never Used      Comment: 1 pack would last a week   . Alcohol use No       Allergies:     Codeine    Medications:      Current Facility-Administered Medications   Medication Dose Route Frequency   . albuterol-ipratropium  3 mL Nebulization Q6H SCH   . amiodarone  200 mg Oral BID   . divalproex EC/DR tablet  500 mg Oral Q12H [place]   . docusate sodium  100 mg Oral Daily   . furosemide  40 mg Intravenous BID    . gabapentin  100 mg Oral Q12H [place]   . insulin glargine  10 Units Subcutaneous Q12H SCH   . labetalol  200 mg Oral Q12H [place]   . pantoprazole  40 mg Oral BID AC   . polyethylene glycol  17 g Oral Daily   . pravastatin  40 mg Oral QPM   . senna  8.6 mg Oral QHS        PRN Meds: albuterol, dextrose, glucagon (rDNA), heparin (porcine), insulin aspart, naloxone, ondansetron **OR** ondansetron, promethazine **OR** promethazine **OR** promethazine, traMADol    Review of Systems:    A 12 point ROS was negative, except for those mentioned above, plus intermittent SOB, weakness of RUE/RLE, generalized edema, constipation, and inability to walk well.    Physical Examination:       BP 130/87   Pulse 91   Temp 97.5 F (36.4 C) (Oral)   Resp 18   Ht 1.702 m (5\' 7" )   Wt 85.3 kg (188 lb 0.8 oz)   SpO2 100%   BMI 29.45 kg/m   General appearance - elderly male, mild to moderate intermittent distress and chronically ill appearing  Mental status - alert, oriented to person, place, and time, anxious at times  Chest - rales noted bilateral bases, L>R, no cough, + L side chest tube  Heart - irregularly irregular rhythm with rate   Abdomen - soft, nontender, nondistended, no masses or organomegaly  GU - +foley catheter, + intermittent spams of the bladder  Neurological - alert, oriented, normal speech, no focal findings or movement disorder noted. +paresthesia of R hand/3 fingers/allodynia. BLE sensory grossly intact.  Musculoskeletal - osteoarthritic changes noted in both hands/bilateral knees, abnormal active range of motion of R thumb/index/middle fingers.  RUE with weakness, 2+ edema. Grips 4/5. L UE/ R hand grips 5/5.  BLE with gross motor intact (hip/knee/feet flexion/extension), wiggle toes +.   Extremities - pedal pulses normal, + BLE/RUE edema, cap refill brisks.  Skin -pale, tight turgor, warm dry.  Psychiatric: pleasant, cooperative, at times/blocking.     Laboratory Findings:     Recent Labs      11/30/16   0130    WBC  13.0*   Hemoglobin  8.0*   Hematocrit  25.5*  Recent Labs      11/30/16   0130   BUN  28*   Creatinine  1.42*       Recent Labs      11/29/16   1937   PT  13.0*   PT INR  1.3       Imaging Studies:       Signed by:  Alberteen Sam DNP  11/30/2016 4:09 PM

## 2016-11-30 NOTE — UM Notes (Signed)
Continued stay review for  [name]  DOB: [birth year]    11/30/2016    Assessment/Plan:  POD # 8  S/p hybrid left ablation   Pt was restarted on heparin drip last night for afib.   BP 105/63   Pulse 99   Temp 97.5 F (36.4 C) (Oral)   Resp 16   Ht 1.702 m (5\' 7" )   Wt 85.3 kg (188 lb 0.8 oz)   SpO2 100%   BMI 29.45 kg/m   Pt at dry weight. 85kg  I/O: negative 56 24 hours   Cardiac: Afib, heparin restarted by Dr. [name]. 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 We will keep blood pressures low, on PO labetalol. Will need to follow up on this before patient leaves.   Respiratory: CXR: Stable small left pleural effusion with left basilar atelectasis or infiltrate. Chest tube output: 137ml last shift  GI: pt eating and drinking, cardiac diet  Heme: WBC 13, H/H 8, 25.5 PLT 189  Renal: creatinine 1.42 improving   Magnesium pending  Neuro: may need MRI in future     Dispo: Transfer to floor today, monitor chest tube output, pain management consult, encourage ambulation and IS use, begin [place] planning    Subjective    No complaints, pt was sleeping. Denies pain currently. On morphine PCA      [facility]  Utilization Review  Quintella Baton  Ph [phone]  Fax [phone]

## 2016-12-01 ENCOUNTER — Inpatient Hospital Stay: Payer: Medicare Other

## 2016-12-01 LAB — CBC
Hematocrit: 25.9 % — ABNORMAL LOW (ref 39.0–52.5)
Hemoglobin: 8.2 gm/dL — ABNORMAL LOW (ref 13.0–17.5)
MCH: 30 pg (ref 28–35)
MCHC: 32 gm/dL (ref 32–36)
MCV: 93 fL (ref 80–100)
MPV: 7.8 fL (ref 6.0–10.0)
PLT CT: 95 10*3/uL — ABNORMAL LOW (ref 130–440)
RBC: 2.8 10*6/uL — ABNORMAL LOW (ref 4.00–5.70)
RDW: 16.7 % — ABNORMAL HIGH (ref 11.0–14.0)
WBC: 21 10*3/uL — ABNORMAL HIGH (ref 4.0–11.0)

## 2016-12-01 LAB — RENAL FUNCTION PANEL
Albumin: 2.2 gm/dL — ABNORMAL LOW (ref 3.5–5.0)
Anion Gap: 9.9 mMol/L (ref 7.0–18.0)
BUN / Creatinine Ratio: 21.6 Ratio (ref 10.0–30.0)
BUN: 29 mg/dL — ABNORMAL HIGH (ref 7–22)
CO2: 28.9 mMol/L (ref 20.0–30.0)
Calcium: 8 mg/dL — ABNORMAL LOW (ref 8.5–10.5)
Chloride: 100 mMol/L (ref 98–110)
Creatinine: 1.34 mg/dL — ABNORMAL HIGH (ref 0.80–1.30)
EGFR: 54 mL/min/{1.73_m2} — ABNORMAL LOW (ref 60–150)
Glucose: 137 mg/dL — ABNORMAL HIGH (ref 71–99)
Osmolality Calc: 278 mOsm/kg (ref 275–300)
Phosphorus: 3.6 mg/dL (ref 2.3–4.7)
Potassium: 3.8 mMol/L (ref 3.5–5.3)
Sodium: 135 mMol/L — ABNORMAL LOW (ref 136–147)

## 2016-12-01 LAB — ECG 12-LEAD
Patient Age: 65 years
Q-T Interval(Corrected): 483 ms
Q-T Interval: 401 ms
QRS Axis: 6 deg
QRS Duration: 103 ms
T Axis: 106 years
Ventricular Rate: 87 //min

## 2016-12-01 LAB — BASIC METABOLIC PANEL
Anion Gap: 8.6 mMol/L (ref 7.0–18.0)
BUN / Creatinine Ratio: 17.1 Ratio (ref 10.0–30.0)
BUN: 25 mg/dL — ABNORMAL HIGH (ref 7–22)
CO2: 32.3 mMol/L — ABNORMAL HIGH (ref 20.0–30.0)
Calcium: 8.4 mg/dL — ABNORMAL LOW (ref 8.5–10.5)
Chloride: 97 mMol/L — ABNORMAL LOW (ref 98–110)
Creatinine: 1.46 mg/dL — ABNORMAL HIGH (ref 0.80–1.30)
EGFR: 49 mL/min/{1.73_m2} — ABNORMAL LOW (ref 60–150)
Glucose: 233 mg/dL — ABNORMAL HIGH (ref 71–99)
Osmolality Calc: 280 mOsm/kg (ref 275–300)
Potassium: 3.9 mMol/L (ref 3.5–5.3)
Sodium: 134 mMol/L — ABNORMAL LOW (ref 136–147)

## 2016-12-01 LAB — VH DEXTROSE STICK GLUCOSE
Glucose POCT: 134 mg/dL — ABNORMAL HIGH (ref 71–99)
Glucose POCT: 120 mg/dL — ABNORMAL HIGH (ref 71–99)
Glucose POCT: 238 mg/dL — ABNORMAL HIGH (ref 71–99)
Glucose POCT: 239 mg/dL — ABNORMAL HIGH (ref 71–99)
Glucose POCT: 275 mg/dL — ABNORMAL HIGH (ref 71–99)

## 2016-12-01 LAB — VH APTT( HEPARIN INFUSION THERAPY)
aPTT: 50.9 s — ABNORMAL HIGH (ref 24.0–34.0)
aPTT: 43.1 s — ABNORMAL HIGH (ref 24.0–34.0)

## 2016-12-01 MED ORDER — APIXABAN 5 MG PO TABS
5.0000 mg | ORAL_TABLET | Freq: Two times a day (BID) | ORAL | Status: DC
Start: 2016-12-01 — End: 2016-12-09
  Administered 2016-12-01 – 2016-12-09 (×16): 5 mg via ORAL
  Filled 2016-12-01 (×17): qty 1

## 2016-12-01 MED ORDER — HYDROCODONE-ACETAMINOPHEN 5-325 MG PO TABS
1.0000 | ORAL_TABLET | ORAL | Status: DC | PRN
Start: 2016-12-01 — End: 2016-12-05
  Administered 2016-12-01 – 2016-12-05 (×21): 1 via ORAL
  Filled 2016-12-01 (×21): qty 1

## 2016-12-01 MED ORDER — VH POTASSIUM CHLORIDE CRYS ER 20 MEQ PO TBCR (WRAP)
40.0000 meq | EXTENDED_RELEASE_TABLET | Freq: Every day | ORAL | Status: DC
Start: 2016-12-01 — End: 2016-12-09
  Administered 2016-12-01 – 2016-12-09 (×8): 40 meq via ORAL
  Filled 2016-12-01 (×9): qty 2

## 2016-12-01 MED ORDER — FUROSEMIDE 10 MG/ML IJ SOLN
60.0000 mg | Freq: Two times a day (BID) | INTRAMUSCULAR | Status: DC
Start: 2016-12-01 — End: 2016-12-03
  Administered 2016-12-01 – 2016-12-03 (×5): 60 mg via INTRAVENOUS
  Filled 2016-12-01 (×5): qty 6

## 2016-12-01 NOTE — Plan of Care (Signed)
Problem: Hemodynamic Status: Cardiac  Goal: Stable vital signs and fluid balance  Outcome: Progressing   12/01/16 0417   Goal/Interventions addressed this shift   Stable vital signs and fluid balance Monitor/assess vital signs and telemetry per unit protocol;Weigh on admission and record weight daily;Assess signs and symptoms associated with cardiac rhythm changes;Monitor intake/output per unit protocol and/or LIP order;Monitor for leg swelling/edema and report to LIP if abnormal

## 2016-12-01 NOTE — Progress Notes (Signed)
Discharge Planner Progress Note  [facility]   [address]   [place] [address]      12/01/16 1613   CM Review   CM Comments 12/01/16:  DCP-  Patient moved to room 330 from ICU s/p hybrid left atrial ablation with resection of portion of xyphoid.  DCP following for SNF placement.  SPoke with PA, informed not ready for d/c.  Will f/u tomorrow.         Alcide Evener, Arita Miss, [facility]  Discharge [place]

## 2016-12-01 NOTE — Progress Notes (Signed)
Per Dr. [name], [place] heparin, eliquis 5mg  BID started.    Durene Cal PA-C  Cardiothoracic Surgery  December 01, 2016 4:34 PM  Pager [phone]

## 2016-12-01 NOTE — OT Progress Note (Signed)
OT Progress Note    [facility]: [facility]  Department of Rehabilitation Services: [phone]    [name]    CSN#: [id number]  SURG TELEMETRY STEP-DOWN 330/330-A    Occupational Therapy Progress Note    Patient's medical condition is appropriate for Occupational therapy intervention at this time.    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 Pt with limited tolerance due to onset of pain with chest tube and occasional bladder spasms as well as very edematous R UE which limits mobility as well as continued weakness present.  Pt completed the following exercises with Right UE: 15 reps grasp/release (AROM), 10 reps wrist flexion/extension (AAROM with tapping to extensors for improved muscle contraction), elbow flexion (AAROM with tapping to flexors for improved muscle contraction), elbow extension (resisted) each x10, scapular retraction x 10 with support under Right forearm, shoulder PROM/AAROM x 10.  Pt instructed on SROM of wrist for improved extension and edema management.  Doff/donned gown seated in chair, cues for shoulder shrug to R to aide with task, completed with Mod A.  Able to oppose to digits 2 & 3 only at  this time.  Pt left up in chair with motion alarm on and Right hand elevated on a pillow on table for hand above elbow.  friends in for a visit.    Patient continues to have the following impairments: decreased ROM, decreased strength, balance deficits, decreased independence with ADLs, decreased independence with IADLs, decreased activity tolerance, decrease safety awareness, decreased attention, decreased cognition, decreased problem solving, decreased functional mobility, decreased functional transfers    Patient will continue to benefit from skilled OT services in order to increase independence and safety with ADLs and functional transfers/mobility.        Goals:   In 2to 3visits:  1. Patient will donn/doff socks with Moderate assistand use of AE/AT prn. ONGOING  2. Patient will thread pants/undergarments over knees with Moderate assistand use of AE/AT/AD prn. ONGOING  3. Patient will stand and arrange pants/undergarments over hips with Moderate assistand use of AE/AT/AD prn. ONGOING  4. Patient will donn/doff shirt/gown with Minimal assistand use of AT/AE prn. ONGOING  5. Patient will perform BSC/chair transfer with Supervisionand use of AD/DME prn. ONGOING  6. Patient will perform toilet transfer with Minimal assistand use of AD/DME prn. ONGOING  9. Patient will perform 2grooming tasks with Minimal assistwhile standingand use of AE/AT prn. ONGOING    LTG (by d/c):  1. Patient will be Supervision Minimal assistfor ADLs, Supervisionfor functional transfers/mobility with use of AD/DME/AT/AE. ONGOING      Plan:   Treatment/interventions: ADL retraining, functional transfer training, UE strengthening/ROM, activity pacing, cognition (safety awareness, problem solving, etc.), patient/family training, equipment  eval/education, compensatory technique education    Treatment Frequency: OT Frequency Recommended: 4-5x/wk     DISCHARGE RECOMMENDATIONS   DME recommended for Discharge:   TBD closer to  d/c  TBD at next level of care    Discharge Recommendations:   SNF         Subjective:   "There is something spinning around out there (outside)"  Patient is agreeable to participation in the therapy session. Nursing clears patient for therapy.    Pain:  At Rest: 0 /10  With Activity: 6/10  Location: Abdomen, bladder spasm per pt  Interventions: Medication (see eMAR)    Objective:   Observation of Patient:    Patient is seated in a bedside chair with dressings, peripheral IV, chest tube, indwelling urinary catheter    Edema: Right UE     Vital Signs:   BP Sitting: 140/95 mmHg  HR Sitting:  101 bpm  SpO2 at rest: 94%          Motor planning: Decreased Processing Speed           Activities of Daily Living:   UB Dressing: Moderate assist; thread RUE, thread LUE performed seated in a chair      Functional Mobility:   Bed Mobility:  Not tested due to patient already OOB.    Transfers:  Not tested due to pt already up in chair, just completed transfer to bsc with CNA [facility]    Participation and Activity Tolerance   Participation effort: Good  Activity Tolerance: Tolerates 10-20 minutes of activity with multiple rests    Other Treatment Interventions:   Treatment Activities:   Therapeutic exercise: the following exercises with Right UE: 15 reps grasp/release (AROM), 10 reps wrist flexion/extension (AAROM with tapping to extensors for improved muscle contraction), elbow flexion (AAROM with tapping to flexors for improved muscle contraction), elbow extension (resisted) each x10, scapular retraction x 10 with support under Right forearm, shoulder PROM/AAROM x 10.  Pt instructed on and demonstrated SROM of wrist for improved extension and edema management.    Self care          Education Provided:   Topics: Role of occupational therapy, plan of care, goals of therapy and HEP, benefits of activity.    Individuals educated: Patient.  Method: Explanation and Demonstration.  Response to education: Verbalized understanding,  Demonstrated understanding and Needs reinforcement.    Team Communication:   OT communicated with: RN/LPN - Erasmo Downer, [place]  OT communicated regarding: Pre-session re: patient status, Patient position at end of session, Patient participation with Therapy, Vital signs  OT/COTA communication: via written note and verbal communication as needed.    Sitting, in a chair, in the room, Family/visitors present, Needs in reach, Bed/chair alarm set, No distress and Affected Extremity elevated    Recommend client participate with self care and mobility as able with assist for safety  outside of and in addition to OT session.    [name] L Tiffanye Hartmann, OTR/L

## 2016-12-01 NOTE — Plan of Care (Signed)
Problem: Moderate/High Fall Risk Score >5  Goal: Patient will remain free of falls  Outcome: Progressing

## 2016-12-01 NOTE — Progress Notes (Signed)
[place] DAILY PROGRESS NOTE      [name]  Attending Physician: [name], MD    [name] REVIEWED INFORMATION BELOW, DISCUSSED RELEVANT POINTS WITH PATIENTS AND PERSONALLY DEVELOPED PLAN OF CARE.    Assessment/Plan:   POD # 9  S/p hybrid left ablation   Pt improving.  BP (!) 140/95   Pulse (!) 101   Temp 97.6 F (36.4 C) (Oral)   Resp 19   Ht 1.702 m (5\' 7" )   Wt 86.4 kg (190 lb 7.6 oz)   SpO2 99%   BMI 29.83 kg/m   Dry weight. 85kg, up 1.4 kg  I/O 24 hour net: Negative 1.6 L  Cardiac: Afib, heparin restarted by Cardiology on 4/23. Cardioversion once stable on oral anticoagulants per Cardiology.   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 We will keep blood pressures low, on PO labetalol. Will need to follow up on this.  Respiratory: CXR: Unchanged left pleural effusion with airspace disease overlying and no pneumothorax. Chest tube output: about 133ml last 24 hours. [place] chest tube   Extremities: right upper extremity swelling 2+, trace on left. Radial pulses 2+, will monitor   Renal: creatinine 1.34 improving. Per nephrology, lasix increased to 60mg  IV   Neuro: may need MRI in future. Pt still moving right arm slowly but this is partially secondary to pain from edema in arm. Grip strength 5/5 symmetric.     Dispo: CM working on eventual discharge to SNF. Continue working with PT now that chest tube is out. Try to wean oxygen, increase IS use    PROCEDURE: Left chest tube removed without complication. Pt tolerated well. Nurse notified.       Subjective     Pt reports he feels "okay". Denies pain currently.     Vital Signs:   Vitals:  Temp: 97.6 F (36.4 C)  Heart Rate: (!) 101  BP: (!) 140/95  SpO2: 99 %  O2 Flow Rate (L/min): 4 L/min    Pre-Op Weight: 85.3 kg (188 lb) (11/15/16 1033)  Post-Op Weight: 86.4 kg (190 lb 7.6 oz) (12/01/16 0349)    Recent Labs  Lab 12/01/16  1228 12/01/16  0810 12/01/16  0347 11/30/16  2047 11/30/16  1659   Glucose, POCT  238* 120* 134* 184* 204*         Intake/Output Summary (Last 24 hours) at 12/01/16 1347  Last data filed at 12/01/16 1345   Gross per 24 hour   Intake             1200 ml   Output             3820 ml   Net            -2620 ml       Physical Exam:     General: awake, alert, no acute distress, pleasant, comfortable  Neck: supple  Cardiovascular: afib rate 101, normal S1,S2 no murmurs, rubs or gallops. Incision c/d/I   Lungs:decreased sounds Left base, no adventitious breath sounds, normal respiratory effort. O2 Sat 99% on 4 L  Abdomen:  normoactive bowel sounds, soft, non-tender, non-distended  Extremities: no clubbing, cyanosis, 1+ edema bilateral ankles. 2+ edema right upper extremity.   Neuro: Cranial nerves II-XII are grossly intact. Speech intact. No facial droop Motor and sensory function of the upper and lower extremities are grossly intact. Pt still moving right arm slowly but this is partially secondary to pain from edema in arm. Grip  strength 5/5 symmetric.     Meds:     Current Facility-Administered Medications   Medication Dose Route Frequency   . amiodarone  200 mg Oral BID   . divalproex EC/DR tablet  500 mg Oral Q12H [place]   . docusate sodium  100 mg Oral Daily   . furosemide  60 mg Intravenous BID   . insulin glargine  10 Units Subcutaneous Q12H [facility]   . labetalol  200 mg Oral Q12H [place]   . pantoprazole  40 mg Oral BID AC   . polyethylene glycol  17 g Oral Daily   . potassium chloride  40 mEq Oral Daily   . pravastatin  40 mg Oral QPM   . senna  8.6 mg Oral QHS     Current Facility-Administered Medications   Medication Dose Route Frequency Last Rate   . heparin infusion - MAR calculator by aPTT  0-3,500 Units/hr Intravenous Titrated 1,300 Units/hr (11/30/16 1829)     Current Facility-Administered Medications   Medication Dose Route   . albuterol  2.5 mg Nebulization   . belladonna-opium  30 mg Rectal   . dextrose  125 mL Intravenous   . glucagon (rDNA)  1 mg Intramuscular   . heparin (porcine)  2,000-4,000  Units Intravenous   . HYDROcodone-acetaminophen  1-2 tablet Oral   . insulin aspart  2-24 Units Subcutaneous   . ondansetron  4 mg Oral    Or   . ondansetron  4 mg Intravenous   . promethazine  25 mg Oral    Or   . promethazine  12.5 mg Rectal    Or   . promethazine  6.25 mg Intramuscular   . traMADol  50 mg Oral             Labs:       Recent Labs  Lab 12/01/16  0101 11/30/16  0824 11/30/16  0130 11/29/16  0504 11/28/16  0116 11/27/16  1205  11/25/16  1120   Sodium 135*  --  139 139 139 137 More results in Results Review 134*   Potassium 3.8  --  4.3 4.7 4.5 4.3 More results in Results Review 6.4*   Chloride 100  --  106 106 103 101 More results in Results Review 104   CO2 28.9  --  28.2 30.3* 32.1* 29.6 More results in Results Review 15.6*   BUN 29*  --  28* 25* 36* 53* More results in Results Review 85*   Creatinine 1.34*  --  1.42* 1.35* 1.77* 2.34* More results in Results Review 4.21*   EGFR 54*  --  51* 54* 39* 28* More results in Results Review 14*   Calcium 8.0*  --  8.2* 8.4* 8.5 8.4* More results in Results Review 9.4   Magnesium  --  2.2  --   --   --   --   --  2.9*   More results in Results Review = values in this interval not displayed.    Recent Labs  Lab 11/30/16  0130 11/29/16  1937 11/29/16  0504   WBC 13.0* 13.1* 13.4*   RBC 2.69* 2.94* 2.66*   Hemoglobin 8.0* 8.7* 8.0*   Hematocrit 25.5* 27.7* 24.8*   MCV 95 94 93   PLT CT 189 210 192       Recent Labs  Lab 11/29/16  1937 11/27/16  1205   PT 13.0* 14.1*   PT INR 1.3 1.4*  Recent Labs  Lab 11/26/16  0302   Cholesterol 70*   Triglycerides 78   HDL 21*   LDL Calculated 33       Recent Labs  Lab 12/01/16  0101  11/28/16  1627  11/25/16  0454   Bilirubin, Total  --   --   --   --  1.4*   Protein, Total  --   --  6.1  --  6.6   Albumin 2.2* More results in Results Review  --  More results in Results Review 3.3*   ALT  --   --   --   --  78*   AST (SGOT)  --   --   --   --  83*   More results in Results Review = values in this interval not  displayed.    Radiology:     Radiology Results (24 Hour)     Procedure Component Value Units Date/Time    XR Chest AP Portable [[id number]] Collected:  12/01/16 0752    Order Status:  Completed Updated:  12/01/16 0754    Narrative:       Clinical History:  Reason For Exam: Evaluate left effusion    Examination:  XR CHEST AP PORTABLE    Comparison:  1 day prior    Technique:  Portable AP    Findings:  Unchanged cardiomegaly.  Left-sided chest tube remains in place without pneumothorax.  Persistent left effusion with associated basilar airspace disease.  Right lung clear.  No acute bony abnormality.      Impression:       1.  NO CHANGE.  2.  LEFT CHEST TUBE WITHOUT PNEUMOTHORAX.  3.  LEFT EFFUSION WITH ASSOCIATED BASILAR AIRSPACE DISEASE.    ReadingStation:[email or web address]          Leanor Kail, [place]  Date: December 01, 2016  Time: 1:47 PM    Please feel free to contact me or [name] for any further questions or concerns.    [facility]  [phone]

## 2016-12-01 NOTE — Progress Notes (Signed)
Renal Progress Note    Follow up for:  ARF  CKD, stage 3    Subjective:     No new complaints; feeling a bit stronger  Modest improvement to scrotal swelling and LE edema  Weights do not appear to be tracking with volume loss evidenced in his recorded I/Os      Objective:     Vitals: BP (!) 140/95   Pulse (!) 101   Temp 97.6 F (36.4 C) (Oral)   Resp 19   Ht 1.702 m (5\' 7" )   Wt 86.4 kg (190 lb 7.6 oz)   SpO2 99%   BMI 29.83 kg/m       I/O:     Intake/Output Summary (Last 24 hours) at 12/01/16 1148  Last data filed at 12/01/16 0827   Gross per 24 hour   Intake             1200 ml   Output             2720 ml   Net            -1520 ml       Weights:  Wt Readings from Last 1 Encounters:   12/01/16 0349 86.4 kg (190 lb 7.6 oz)   11/30/16 0600 85.3 kg (188 lb 0.8 oz)   11/29/16 0352 85.4 kg (188 lb 4.4 oz)   11/27/16 0553 88.1 kg (194 lb 3.6 oz)   11/26/16 0525 87.8 kg (193 lb 9 oz)   11/25/16 0540 86.3 kg (190 lb 4.8 oz)   11/24/16 0549 86.7 kg (191 lb 2.2 oz)   11/22/16 0608 85.6 kg (188 lb 11.4 oz)   11/15/16 1033 85.3 kg (188 lb)       Physical Exam:  General: No distress.  Lungs: CTA  Heart: RRR, no murmur, gallop, rub, or heave.  Abdomen: BS present, soft, NT/ND, no organomegaly.  Extremities: 1+ edema.  Access: N/A        Labs:  Labs reviewed.  Radiology results reviewed.  Pertinent findings below.  Recent Labs      11/30/16   0130  11/29/16   1937   WBC  13.0*  13.1*   Hemoglobin  8.0*  8.7*   Hematocrit  25.5*  27.7*   PLT CT  189  210     Recent Labs      12/01/16   0101  11/30/16   1650  11/30/16   0824   11/29/16   1937   PT   --    --    --    --   13.0*   PT INR   --    --    --    --   1.3   aPTT  50.9*  42.9*  46.2*   < >  29.1    < > = values in this interval not displayed.     No results for input(s): TROPI, CK, CKMBINDEX in the last 72 hours.  No results for input(s): BNP in the last 72 hours.        Xr Chest Ap Portable    Result Date: 12/01/2016  1.  NO CHANGE. 2.  LEFT CHEST TUBE WITHOUT  PNEUMOTHORAX. 3.  LEFT EFFUSION WITH ASSOCIATED BASILAR AIRSPACE DISEASE. ReadingStation:[email or web address]     Recent Labs      12/01/16   0101  11/30/16   0130  11/29/16   0504   Glucose  137*  170*  162*   Sodium  135*  139  139   Potassium  3.8  4.3  4.7   Chloride  100  106  106   CO2  28.9  28.2  30.3*   BUN  29*  28*  25*   Creatinine  1.34*  1.42*  1.35*   EGFR  54*  51*  54*   Calcium  8.0*  8.2*  8.4*     Recent Labs      12/01/16   0101  11/30/16   0824  11/30/16   0130  11/29/16   0504   Magnesium   --   2.2   --    --    Phosphorus  3.6   --   2.9  2.8     Recent Labs      12/01/16   0101  11/30/16   0130  11/29/16   0504  11/28/16   1627   Albumin  2.2*  2.3*  2.4*   --    Protein, Total   --    --    --   6.1    No results for input(s): SGUR, LABPH, PROTEINUR, GLUUA, KETONESUA, BILIUA, BLOODUA, NITRITEUA, UROBILIUA, LEUA, NITRITEUA, WBCUA, RBCUA, BACTERIAUA in the last 72 hours.    Invalid input(s):  AMORPHOUSUA               Assessment:     1. AKI - pre-renal ischemia +/- ATN resulting from pericardial effusion; peak creatinine 4.64 this morning, now running better than previous baseline owing to persistent hypervolemia and relative loss of muscle mass  2. Stage III chronic kidney disease-with apparent baseline creatinine 1.6-2.1 mg/dL; most recent creatinine value on outpatient basis had been 2.0 mg/dL on 11/15/16. Suspect underlying diabetic/hypertensive kidney disease.  3. Hyperkalemia-associated with reduced renal function and ACE inhibitor use; also component of hyperglycemia  4. Metabolic acidosis-associated with reduced renal function, resolved  5. Ileus, improved  6. Status post hybrid left atrial ablation (convergent procedure) for atrial fibrillation  7. Hypertension  8. Subacute CVA of left frontoparietal lobes  9. Type 2 diabetes mellitus      Plan:      sCr stable and better than previous baseline possibly owing to volume expansion and relative losses to muscle mass   Continue to diurese;  creatinine may increase modestly as he approaches dry weight; will accept return to previous baseline   KCL supplements while actively diuresing; holding parameters provided   Continue with daily chemistries for now      Orders and Medications reviewed in Epic.    Blanchard Mane, Pager [phone]  11:48 AM  12/01/2016  [facility]  Dr. Hurley Cisco, Dr. Josephina Shih, Dr. Fernande Boyden  Office: [phone]

## 2016-12-01 NOTE — Progress Notes (Signed)
Cardiology Progress Note      Date Time: 12/01/16 1:16 PM  [name], [name]      Assessment:     Active Hospital Problems    Diagnosis   . Chronic anticoagulation   . Stroke   . CKD (chronic kidney disease)   . Former smoker, stopped smoking many years ago   . Type 2 diabetes mellitus   . HTN (hypertension)   . HLD (hyperlipidemia)   . Diabetic neuropathy   . COPD (chronic obstructive pulmonary disease)   . Bipolar disorder   . BPH (benign prostatic hyperplasia)   . Atrial fibrillation   . Chronic atrial fibrillation   . Stage 4 chronic kidney disease             Plan:     1. Feels better, renal function improved.   2. Chest tube out today. CXR appears unchanged.   3. Plan for cardioversion once stability of oral anticoagulants has been established.   4. Right arm swelling > left, on therapeutic heparin, will continue to montior.   5. Left CVA with right sided symptoms, mild improvement in neurologic symptoms today.       Subjective:   Feels better.     Physical Exam:     Temp:  [97.5 F (36.4 C)-98.4 F (36.9 C)] 97.6 F (36.4 C)  Heart Rate:  [75-110] 101  Resp Rate:  [16-19] 19  BP: (130-142)/(82-97) 140/95    Wt Readings from Last 3 Encounters:   12/01/16 86.4 kg (190 lb 7.6 oz)   10/21/16 85.5 kg (188 lb 9.6 oz)   10/07/16 84 kg (185 lb 3.2 oz)            Intake/Output Summary (Last 24 hours) at 12/01/16 1316  Last data filed at 12/01/16 1209   Gross per 24 hour   Intake             1200 ml   Output             3420 ml   Net            -2220 ml       General appearance - alert, well appearing, and in no distress  Chest -poor air movement, diffuse scattered rhonchi  Heart - irregularly irregular rhythm  Extremities - 2+ lower extremity edema  Abd: soft. Non-tender, non-distended,  Neuro: alert and oriented  Skin: no rashes  Psych: normal affect    Medications:     Scheduled Meds:   amiodarone 200 mg Oral BID   divalproex EC/DR tablet 500 mg Oral Q12H SCH   docusate sodium 100 mg Oral Daily    furosemide 60 mg Intravenous BID   insulin glargine 10 Units Subcutaneous Q12H SCH   labetalol 200 mg Oral Q12H SCH   pantoprazole 40 mg Oral BID AC   polyethylene glycol 17 g Oral Daily   potassium chloride 40 mEq Oral Daily   pravastatin 40 mg Oral QPM   senna 8.6 mg Oral QHS       . heparin infusion - MAR calculator by aPTT 1,300 Units/hr (11/30/16 1829)         Labs:       Recent Labs  Lab 12/01/16  0101 11/30/16  0824 11/30/16  0130 11/29/16  0504  11/25/16  0454   Glucose 137*  --  170* 162* More results in Results Review 230*   BUN 29*  --  28* 25* More results in  Results Review 75*   Creatinine 1.34*  --  1.42* 1.35* More results in Results Review 3.79*   Sodium 135*  --  139 139 More results in Results Review 133*   Potassium 3.8  --  4.3 4.7 More results in Results Review 6.3*   Chloride 100  --  106 106 More results in Results Review 102   CO2 28.9  --  28.2 30.3* More results in Results Review 19.4*   Magnesium  --  2.2  --   --  More results in Results Review  --    AST (SGOT)  --   --   --   --   --  83*   ALT  --   --   --   --   --  78*   More results in Results Review = values in this interval not displayed.  Estimated Creatinine Clearance: 56.1 mL/min (A) (based on SCr of 1.34 mg/dL (H)).      Recent Labs  Lab 11/26/16  0302   CHOL 70*   TRIG 78   HDL 21*   LDL 33         Recent Labs  Lab 11/30/16  0130 11/29/16  1937 11/29/16  0504  11/27/16  1205   WBC 13.0* 13.1* 13.4* More results in Results Review 14.4*   Hemoglobin 8.0* 8.7* 8.0* More results in Results Review 8.4*   Hematocrit 25.5* 27.7* 24.8* More results in Results Review 26.1*   PLT CT 189 210 192 More results in Results Review 191   PT INR  --  1.3  --   --  1.4*   More results in Results Review = values in this interval not displayed.    No results for input(s): TROPI, CK, CKMB in the last 8760 hours.      Recent Labs  Lab 11/26/16  0302   TSH 1.81       Radiology: all results from this admission  Xr Chest 2 Views    Result Date:  11/15/2016  Cardiomegaly with cephalization of pulmonary vascular flow. No focal infiltrate, pleural effusion, or pneumothorax. ReadingStation:[email or web address]    Xr Abdomen Ap    Result Date: 11/24/2016  Findings worrisome for partial proximal or mid small bowel obstruction. ReadingStation:[email or web address]    Ct Head Wo Contrast    Result Date: 11/24/2016  1. No evidence of intracranial hemorrhage. 2. Probable subacute infarct superior LEFT frontoparietal lobe gyrus. 3. If clinically indicated, MRI may be helpful in further evaluation. ReadingStation:[email or web address]    Ct Chest Wo Contrast    Result Date: 11/28/2016  1.  DESCENDING THORACIC AORTIC DISSECTION. 2.  LARGE LEFT EFFUSION WITH COMPLETE LEFT LOWER LOBE COLLAPSE. 3.  CARDIOMEGALY WITH PERICARDIAL FLUID. 4.  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 ReadingStation:[email or web address]    Xr Chest Ap Only    Result Date: 11/22/2016  No pneumothorax or significant acute pulmonary abnormalities. Small amount of subcutaneous emphysema in the anterior chest. ReadingStation:[email or web address]    Xr Chest Ap Portable    Result Date: 12/01/2016  1.  NO CHANGE. 2.  LEFT CHEST TUBE WITHOUT PNEUMOTHORAX. 3.  LEFT EFFUSION WITH ASSOCIATED BASILAR AIRSPACE DISEASE. ReadingStation:[email or web address]    Xr Chest Ap Portable    Result Date: 11/30/2016  Stable small left pleural effusion with left basilar atelectasis or infiltrate. ReadingStation:[id number]    Xr Chest Ap Portable    Result Date: 11/29/2016  1.  Left apical chest tube in place without appreciable pneumothorax. 2.  Hazy opacification of the left base likely represents a combination of atelectasis and a small amount of pleural fluid. 3.  Unchanged cardiomegaly. ReadingStation:[email or web address]    Xr Chest Ap Portable    Result Date: 11/28/2016  1.  NO PNEUMOTHORAX AFTER CHEST TUBE PLACEMENT. 2.  LEFT EFFUSION NEARLY COMPLETELY RESOLVED. ReadingStation:[email or web address]    Xr Chest Ap Portable    Result Date: 11/28/2016  Increase left effusion and  worsening airspace disease left lower lung. ReadingStation:[email or web address]    Xr Chest Ap Portable    Result Date: 11/26/2016  Stable pulmonary vascular congestion and left basilar atelectasis or infiltrate. Probable small left pleural effusion is unchanged. ReadingStation:[id number]    Xr Chest Ap Portable    Result Date: 11/24/2016  Cardiomegaly with bilateral airspace disease or edema with small left effusion. No observed pneumothorax. Interval removal right IJ central line. ReadingStation:[email or web address]    X-ray Chest Ap Portable    Result Date: 11/23/2016  1. Pulmonary vascular congestion without overt failure 2. Left basilar opacity likely combination of atelectasis, consolidation and small effusion. 3. Catheter appliances as above ReadingStation:[email or web address]    Xr Abdomen Portable    Result Date: 11/25/2016  No acute abnormality. [place], DO

## 2016-12-01 NOTE — Progress Notes (Signed)
[facility] PAIN MANAGEMENT PROGRESS NOTE    Subjective   Patient reports feeling a bit better. Had his chest tube removed today and his bladder spasm is still present, but B&O supp was helpful, though it was only 1 dose. He is taking the hydrocodone intermittently. Presently, he denies any pain, or excessive sedation. He reports that he has been able to work with PT for rehab. Has had BMs as well.     Objectives:     Vitals:    12/01/16 1558   BP: 160/85   Pulse: 83   Resp: 19   Temp: 98.2 F (36.8 C)   SpO2: 96%     General appearance -frail-appearing elderly male, alert, in no distress.  Mental status -alert, oriented to person, place, and time  CV - irregularly irregular HR, + murmur  Chest - crackles + at bases, + cough  Abdomen - Soft, ND, NT, +BM  Extremities -moderate RUE/ BLE edema, no cyanosis  Skin -normal coloration and tight turgor, no rashes.  Psych - anxious, conversant, appropriate/occ blocking, good eye contact.    Labs:  No results for input(s): WBC, HGB, HCT in the last 24 hours.    Invalid input(s):  PLT  No results for input(s): PT, INR, PTT in the last 24 hours.  Recent Labs      12/01/16   0101   BUN  29*   Creatinine  1.34*   Potassium  3.8        Assessment/Plan   1.  Urethral spasm/BPH pain - improved, still has a foley catheter in place. Can use B&O supp more frequently, spoke with primary nursing about it as well as the patient.  2.  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  OA pain - bilateral knees - improving, continued with current hydrocodone, at 5 mg tabs at this point, plus tramadol.    Alberteen Sam DNP  12/01/2016

## 2016-12-01 NOTE — PT Progress Note (Signed)
[facility]: [facility]  Department of Rehabilitation Services: [phone]  [name]    CSN: [id number]    SURG TELEMETRY STEP-DOWN   330/330-A    Physical Therapy Treatment Note    Time of treatment:   Time Calculation  PT Received On: 12/01/16  Start Time: 0935  Stop Time: 1000  Time Calculation (min): 25 min    Visit#: 2    Last seen by Physical therapist vs. PTA: 11/30/16    Medical Diagnosis/Pertinent medical/surgical details:    hybrid convergent atrial ablation procedure on 11/22/16. On 11/25/16, the patient was transferred to the ICU with shortness of breath. The patient had a large pericardial effusion drained on 11/25/16. The patient is also presenting with right sided weakness, particularly on the right upper extremity. 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       Precautions and Contraindications:  BP Precautions: Systolic 132-440  Falls    Assessment:   Patient's progress towards established goals: patient able to progress ambulation distance with use of a FWW minimal (A) x 10' aide present for safety/lines;VC's for re-direction/focus on task at hand and for R UE/LE use with all tasks;OOB to chair end of session     Patient continues to have the following impairments: decreased strength, decreased activity tolerance, impaired motor control , decreased functional mobility, decreased balance, gait deficits    Patient will continue to benefit from skilled PT services in order to progress strength, gait quality/endurance, and ability to perform all functional tasks safely.       Goals:   To be completed by discharge:  1. The patient will complete all bed mobility with supervision in order to prepare to transfer. ONGOING  2. The patient will complete sit to stand and stand to sit transfers with supervision in order to prepare to ambulate. ONGOING  3. The patient will ambulate 150 feet with supervision with LRAD in order to  demonstrate independence with mobility. ONGOING  4. The patient will ascend/descend 7 stairs with 2 rails with supervision in order to demonstrate the ability to get into his home. ONGOING       Plan:   Treatment/interventions: Exercise, Gait training, Stair training, Functional transfer training, LE strengthening/ROM, Bed mobility    Treatment Frequency: 4-5x/wk    DISCHARGE RECOMMENDATIONS   DME recommended for Discharge:   TBD at next level of care    Discharge Recommendations:   SNF         Subjective:   "I have to will it to work ( R UE/LE)."   Patient is agreeable to participation in the therapy session. Nursing clears patient for therapy.    Pain:  At Rest: 0 /10  With Activity: 3/10  Location: Generalized  Interventions: Medication (see eMAR), Rest    OBJECTIVE:   Observation of Patient/Vital Signs:   Patient is in bed with Bed/chair alarm on, telemetry, peripheral IV, O2 at 4 liters/minute via nasal cannula, chest tube, indwelling urinary catheter  Patient's medical condition is appropriate for Physical therapy intervention at this time.    Vital Signs:  BP Supine:  133/68 mmHg  HR Supine: 103 bpm, 119 with activity  SpO2 at rest: 98%    Edema: present  Skin Inspection: appears intact, limited observation    Oriented to: Oriented x4  Command following: Follows 1 step commands without difficulty, Follows multi-step commands with increased time, Follows multi-step commands with repetition  Alertness/Arousal: Appropriate  responses to stimuli   Attention Span:Attends to task with redirection  Memory: Appears intact  Safety Awareness: minimal verbal instruction    Musculoskeletal and Balance Details:   Balance:  Static Sitting:  Good  Static Standing:  stands with FWW CGA/close (S) for safety          Bed Mobility:   Supine to Sit:   Minimal assist.   Cues for Sequencing., Cues for Hand placement., HOB elevated, Bed rail used, assist at trunk  Seated Scooting:   Supervision    Transfers:  Sit to Stand:  Minimal  assist with Front wheeled walker.    Cues for Sequencing, Cues for Hand Placement  Stand to Sit:  Minimal assist.    Cues for Sequencing, Cues for Hand Placement    Locomotion:  LEVEL AMBULATION:  Distance: 10'   Assistance level:  Minimal assist aide present for safety  Device:  Front wheeled walker  Pattern:  Step to, Decreased cadence, Decreased step length:  bilaterally, Decreased clearance:  bilaterally, R LE externally rotated  Distance limited by: endurance    Participation and Activity Tolerance   Participation effort: Good  Activity Tolerance: Tolerates 10-20 minutes of activity with multiple rests    Other Treatment Interventions this session:   Therapeutic exercise:  Supine: Ankle pump: 10x   Sitting:  Isometric hip adduction:  10x   Long Arc Quads:  10x   Therapeutic activity  Clinical biochemist education     Education Provided:   TOPICS: role of physical therapy, plan of care, goals of therapy and HEP, safety with mobility and ADLs, benefits of activity, activity with nursing    Learner educated: Patient  Method: Explanation  Response to education: Needs reinforcement    Patient Position at End of Treatment:   Sitting, in a chair, Needs in reach, Bed/chair alarm set and No distress    Team Communication:   Spoke to : RN/LPN - Kristen, OT [employer]  Regarding: Pre-session re: patient status, Patient position at end of session, Patient participation with Therapy  Whiteboard updated: Yes  PT/PTA communication: via written note and verbal communication as needed.      Recommend patient up to chair for meals and short walks with FWW (A) x 1-2 as needed outside of PT sessions.    Dede Query

## 2016-12-02 ENCOUNTER — Encounter: Payer: Self-pay | Admitting: Specialist

## 2016-12-02 ENCOUNTER — Inpatient Hospital Stay: Payer: Medicare Other

## 2016-12-02 LAB — RENAL FUNCTION PANEL
Albumin: 2.3 gm/dL — ABNORMAL LOW (ref 3.5–5.0)
Anion Gap: 11.6 mMol/L (ref 7.0–18.0)
BUN / Creatinine Ratio: 16.9 Ratio (ref 10.0–30.0)
BUN: 20 mg/dL (ref 7–22)
CO2: 30.2 mMol/L — ABNORMAL HIGH (ref 20.0–30.0)
Calcium: 8.3 mg/dL — ABNORMAL LOW (ref 8.5–10.5)
Chloride: 96 mMol/L — ABNORMAL LOW (ref 98–110)
Creatinine: 1.18 mg/dL (ref 0.80–1.30)
EGFR: 63 mL/min/{1.73_m2} (ref 60–150)
Glucose: 107 mg/dL — ABNORMAL HIGH (ref 71–99)
Osmolality Calc: 271 mOsm/kg — ABNORMAL LOW (ref 275–300)
Phosphorus: 3.2 mg/dL (ref 2.3–4.7)
Potassium: 3.8 mMol/L (ref 3.5–5.3)
Sodium: 134 mMol/L — ABNORMAL LOW (ref 136–147)

## 2016-12-02 LAB — VH URINALYSIS WITH MICROSCOPIC AND CULTURE IF INDICATED
Bilirubin, UA: NEGATIVE
Glucose, UA: >=500 mg/dL — AB
Ketones UA: NEGATIVE mg/dL
Leukocyte Esterase, UA: NEGATIVE Leu/uL
Nitrite, UA: NEGATIVE
Protein, UR: NEGATIVE mg/dL
RBC, UA: 14 /hpf — ABNORMAL HIGH (ref 0–4)
Urine Specific Gravity: 1.01 (ref 1.001–1.040)
Urobilinogen, UA: 2 mg/dL — AB
WBC, UA: 1 /hpf (ref 0–4)
pH, Urine: 7 pH (ref 5.0–8.0)

## 2016-12-02 LAB — CBC AND DIFFERENTIAL
Bands: 1 % (ref 0–10)
Basophils %: 0 % (ref 0.0–3.0)
Basophils Absolute: 0 10*3/uL (ref 0.0–0.3)
Eosinophils %: 2 % (ref 0.0–7.0)
Eosinophils Absolute: 0.6 10*3/uL (ref 0.0–0.8)
Hematocrit: 27.1 % — ABNORMAL LOW (ref 39.0–52.5)
Hemoglobin: 8.5 gm/dL — ABNORMAL LOW (ref 13.0–17.5)
Lymphocytes Absolute: 1.1 10*3/uL (ref 0.6–5.1)
Lymphocytes: 4 % — ABNORMAL LOW (ref 15.0–46.0)
MCH: 29 pg (ref 28–35)
MCHC: 31 gm/dL — ABNORMAL LOW (ref 32–36)
MCV: 93 fL (ref 80–100)
MPV: 7.8 fL (ref 6.0–10.0)
Monocytes Absolute: 2.5 10*3/uL — ABNORMAL HIGH (ref 0.1–1.7)
Monocytes: 9 % (ref 3.0–15.0)
Neutrophils %: 84 % — ABNORMAL HIGH (ref 42.0–78.0)
Neutrophils Absolute: 23.8 10*3/uL — ABNORMAL HIGH (ref 1.7–8.6)
PLT CT: 111 10*3/uL — ABNORMAL LOW (ref 130–440)
RBC: 2.91 10*6/uL — ABNORMAL LOW (ref 4.00–5.70)
RDW: 17.3 % — ABNORMAL HIGH (ref 11.0–14.0)
WBC: 28 10*3/uL — ABNORMAL HIGH (ref 4.0–11.0)

## 2016-12-02 LAB — HEPATIC FUNCTION PANEL
ALT: 190 U/L — ABNORMAL HIGH (ref 0–55)
AST (SGOT): 18 U/L (ref 10–42)
Albumin/Globulin Ratio: 0.78 Ratio (ref 0.70–1.50)
Albumin: 2.5 gm/dL — ABNORMAL LOW (ref 3.5–5.0)
Alkaline Phosphatase: 128 U/L (ref 40–145)
Bilirubin Direct: 0.6 mg/dL — ABNORMAL HIGH (ref 0.0–0.3)
Bilirubin, Total: 1.1 mg/dL (ref 0.1–1.2)
Globulin: 3.2 gm/dL (ref 2.0–4.0)
Protein, Total: 5.7 gm/dL — ABNORMAL LOW (ref 6.0–8.3)

## 2016-12-02 LAB — PT/INR
PT INR: 1.4 — ABNORMAL HIGH (ref 0.5–1.3)
PT: 14 s — ABNORMAL HIGH (ref 9.5–11.5)

## 2016-12-02 LAB — VH DEXTROSE STICK GLUCOSE
Glucose POCT: 123 mg/dL — ABNORMAL HIGH (ref 71–99)
Glucose POCT: 110 mg/dL — ABNORMAL HIGH (ref 71–99)
Glucose POCT: 262 mg/dL — ABNORMAL HIGH (ref 71–99)
Glucose POCT: 153 mg/dL — ABNORMAL HIGH (ref 71–99)
Glucose POCT: 129 mg/dL — ABNORMAL HIGH (ref 71–99)

## 2016-12-02 LAB — LACTIC ACID, PLASMA: Lactic Acid: 1.78 mMol/L (ref 0.50–2.10)

## 2016-12-02 LAB — APTT: aPTT: 34 s (ref 24.0–34.0)

## 2016-12-02 LAB — HEPARIN-INDUCED PLATELET ANTIBODY: Heparin-PF4 Ab (HIT): NEGATIVE

## 2016-12-02 MED ORDER — VH BIO-K PLUS PROBIOTIC 50 BIL CFU CAPSULE
50.0000 | DELAYED_RELEASE_CAPSULE | Freq: Every day | ORAL | Status: DC
Start: 2016-12-02 — End: 2016-12-09
  Administered 2016-12-02 – 2016-12-09 (×8): 50 via ORAL
  Filled 2016-12-02 (×7): qty 1

## 2016-12-02 MED ORDER — VANCOMYCIN HCL IN DEXTROSE 1-5 GM/200ML-% IV SOLN
1000.0000 mg | Freq: Two times a day (BID) | INTRAVENOUS | Status: DC
Start: 2016-12-02 — End: 2016-12-08
  Administered 2016-12-02 – 2016-12-08 (×13): 1000 mg via INTRAVENOUS
  Filled 2016-12-02 (×13): qty 1000

## 2016-12-02 MED ORDER — PIPERACILLIN-TAZOBACTAM IN DEX 3-0.375 GM/50ML IV SOLN
3.3750 g | Freq: Three times a day (TID) | INTRAVENOUS | Status: DC
Start: 2016-12-02 — End: 2016-12-05
  Administered 2016-12-02 – 2016-12-05 (×9): 3.375 g via INTRAVENOUS
  Filled 2016-12-02 (×10): qty 50

## 2016-12-02 MED ORDER — VH VANCOMYCIN THERAPY PLACEHOLDER
Status: DC
Start: 2016-12-02 — End: 2016-12-08

## 2016-12-02 NOTE — Progress Notes (Signed)
Renal Progress Note    Follow up for:  ARF  CKD, stage 3    Subjective:     Diuresing well but edema still slow to resolve  Weight is gradually improving  Patient complaining of "bladder spasms"  Willing to have Foley removed today      Objective:     Vitals: BP 160/82   Pulse (!) 107   Temp 98.1 F (36.7 C) (Oral)   Resp 20   Ht 1.702 m (5\' 7" )   Wt 84.8 kg (186 lb 15.2 oz)   SpO2 97%   BMI 29.28 kg/m       I/O:     Intake/Output Summary (Last 24 hours) at 12/02/16 1449  Last data filed at 12/02/16 1442   Gross per 24 hour   Intake             2130 ml   Output             5050 ml   Net            -2920 ml       Weights:  Wt Readings from Last 1 Encounters:   12/02/16 0335 84.8 kg (186 lb 15.2 oz)   12/01/16 0349 86.4 kg (190 lb 7.6 oz)   11/30/16 0600 85.3 kg (188 lb 0.8 oz)   11/29/16 0352 85.4 kg (188 lb 4.4 oz)   11/27/16 0553 88.1 kg (194 lb 3.6 oz)   11/26/16 0525 87.8 kg (193 lb 9 oz)   11/25/16 0540 86.3 kg (190 lb 4.8 oz)   11/24/16 0549 86.7 kg (191 lb 2.2 oz)   11/22/16 0608 85.6 kg (188 lb 11.4 oz)   11/15/16 1033 85.3 kg (188 lb)       Physical Exam:  General: No distress.  Lungs: CTA  Heart: RRR, no murmur, gallop, rub, or heave.  Abdomen: BS present, soft, NT/ND, no organomegaly.  Extremities: 1+ edema.  Access: N/A        Labs:  Labs reviewed.  Radiology results reviewed.  Pertinent findings below.  Recent Labs      12/02/16   0923  12/01/16   1727   WBC  28.0*  21.0*   Hemoglobin  8.5*  8.2*   Hematocrit  27.1*  25.9*   PLT CT  111*  95*     Recent Labs      12/02/16   1407  12/01/16   1325  12/01/16   0101   11/29/16   1937   PT  14.0*   --    --    --   13.0*   PT INR  1.4*   --    --    --   1.3   aPTT  34.0  43.1*  50.9*   < >  29.1    < > = values in this interval not displayed.     No results for input(s): TROPI, CK, CKMBINDEX in the last 72 hours.  No results for input(s): BNP in the last 72 hours.        Xr Chest Ap Portable    Result Date: 12/02/2016  1.  NO ACUTE CARDIOPULMONARY  CHANGE. 2.  NO PNEUMOTHORAX. 3.  LEFT EFFUSION AND ASSOCIATED BASILAR AIRSPACE DISEASE. ReadingStation:[email or web address]     Recent Labs      12/02/16   0559  12/01/16   1727  12/01/16   0101   Glucose  107*  233*  137*  Sodium  134*  134*  135*   Potassium  3.8  3.9  3.8   Chloride  96*  97*  100   CO2  30.2*  32.3*  28.9   BUN  20  25*  29*   Creatinine  1.18  1.46*  1.34*   EGFR  63  49*  54*   Calcium  8.3*  8.4*  8.0*     Recent Labs      12/02/16   0559  12/01/16   0101  11/30/16   0824  11/30/16   0130   Magnesium   --    --   2.2   --    Phosphorus  3.2  3.6   --   2.9     Recent Labs      12/02/16   1407  12/02/16   0559  12/01/16   0101   Albumin  2.5*  2.3*  2.2*   Protein, Total  5.7*   --    --    Bilirubin, Total  1.1   --    --    Alkaline Phosphatase  128   --    --    ALT  190*   --    --    AST (SGOT)  18   --    --     Recent Labs      12/02/16   1336   Specific Gravity, UR  1.010   pH, Urine  7.0   Protein, UR  Negative   Glucose, UA  >=500*   Ketones UA  Negative   Bilirubin, UA  Negative   Blood, UA  Small*   Nitrite, UA  Negative   Urobilinogen, UA  2.0*   Leukocyte Esterase, UA  Negative   WBC, UA  1   RBC, UA  14*                  Assessment:     1. AKI - pre-renal ischemia +/- ATN resulting from pericardial effusion; peak creatinine 4.64 this morning, now running better than previous baseline owing to persistent hypervolemia and relative loss of muscle mass  2. Stage III chronic kidney disease-with apparent baseline creatinine 1.6-2.1 mg/dL; most recent creatinine value on outpatient basis had been 2.0 mg/dL on 11/15/16. Suspect underlying diabetic/hypertensive kidney disease.  3. Hyperkalemia-associated with reduced renal function and ACE inhibitor use; also component of hyperglycemia  4. Metabolic acidosis-associated with reduced renal function, resolved  5. Ileus, improved  6. Status post hybrid left atrial ablation (convergent procedure) for atrial fibrillation  7. Hypertension  8. Subacute CVA  of left frontoparietal lobes  9. Type 2 diabetes mellitus      Plan:      sCr remains stable and better than previous baseline possibly owing to volume expansion and relative losses to muscle mass   Continue to diurese; creatinine may increase modestly as he approaches dry weight; will accept return to previous baseline   KCL supplements while actively diuresing; holding parameters provided   Agree with removing foley   Serial PVR's and PRN I/O caths to ensure adequate voiding   Continue with daily chemistries for now      Orders and Medications reviewed in Epic.  All elements of the note and care plan reviewed and, where text is copied/unchanged, remains as documented above.    Blanchard Mane, Pager [phone]  2:49 PM  12/02/2016  [facility]  Dr. Hurley Cisco, Dr. Josephina Shih, Dr. Fernande Boyden  Office: [phone]

## 2016-12-02 NOTE — Progress Notes (Signed)
Pharmacy Vancomycin Dosing Consult Note  [name] [facility]    Assessment:   1. Day # 1 Vancomycin for HAP/positive sepsis screening for 4 yoM.  Pt is also on Zosyn. RRT initiated today and will continue to follow. POD #10 s/p hybrid left ablation.  CXR persistent left effusion and basilar airspace disease.  Creatinine appears to be improving, WBCs worsening, afebrile.  Chest tube out, complains of incision pain at chest tube site.       Plan:   1. Continue Vancomycin 1 gm IV q12h.  Trough scheduled for 4/28 at 1000.  2. Pharmacy will follow the patient's renal function, vancomycin levels, and dosing during the course of therapy. If you have any questions, please contact the pharmacist at [phone].     Indication: HAP/positive sepsis screening.    Age: 66 y.o.  Height: 1.702 m (5\' 7" )  Weight:  84.8 kg (186 lb 15.2 oz)  IBW: 66 kg  DW: N/A    CrCL:  63 ml/min    Other Nephrotoxic Drugs: furosemide, protonix, Zosyn, pravastatin    Drug Levels:   Trough level: due 4/28 at 1000     Pharmacokinetics:   t50: 12 hrs         Kel: 0.05669 hr-1        Vd: 60 L         Expected Trough: 17 mg/L                Recent Labs  Lab 12/02/16  0559 12/01/16  1727 12/01/16  0101 11/30/16  0130   Creatinine 1.18 1.46* 1.34* 1.42*   BUN 20 25* 29* 28*       Recent Labs  Lab 12/02/16  0923 12/01/16  1727 11/30/16  0130 11/29/16  1937   WBC 28.0* 21.0* 13.0* 13.1*     Temp (24hrs), Avg:98.1 F (36.7 C), Min:97.5 F (36.4 C), Max:98.8 F (37.1 C)      Cultures:   Date Source Organism Sensitivities Resistance   4/26 Blood x 2 In progress

## 2016-12-02 NOTE — Plan of Care (Signed)
Problem: Hemodynamic Status: Cardiac  Goal: Stable vital signs and fluid balance   12/02/16 1157   Goal/Interventions addressed this shift   Stable vital signs and fluid balance Monitor/assess vital signs and telemetry per unit protocol;Weigh on admission and record weight daily;Assess signs and symptoms associated with cardiac rhythm changes;Monitor intake/output per unit protocol and/or LIP order   Smart goal: patient will remain free of falls throughout the shift

## 2016-12-02 NOTE — Progress Notes (Signed)
Assess for End Organ Dysfunction of Sepsis:     SBP < 90 (or 40 below baseline) or MAP<65  No   Bilirubin >2  No   Platelet count < 100,000 No   INR > 1.5 w/o anticoagulation  No     APTT > 60 sec w/o anticoagulation  No    Creatinine > 2 (not chronic)  No   New NIV or MV No   Lactic Acid WNL       BPA sepsis screen is positive.  Based on lab results, screen does not show end organ dysfunction of sepsis.  RRT will follow and assist as needed.         Otelia Santee RN   Rapid Response Team Nurse

## 2016-12-02 NOTE — OT Progress Note (Signed)
[facility]: [facility]  Department of Rehabilitation Services: [id number]    [name]    CSN#: [id number]  SURG TELEMETRY STEP-DOWN 330/330-A    Occupational Therapy Progress Note    Patient's medical condition is appropriate for Occupational therapy intervention at this time.    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  Pt continues to complain of significant pain and bladder spasms with increased mobility.  Pt also complain of chest pain where chest tube was inserted.  Pt's pain is limiting his ability to participate with therapy services.  Pt able to demonstrate improved mobility this date.  Pt would continue to benefit from skilled OT services to address ADL and functional mobility deficits.  Recommend SNF following D/C.    Patient continues to have the following impairments: decreased ROM, decreased strength, decreased coordination, balance deficits, decreased independence with ADLs, decreased independence with IADLs, decreased activity tolerance, decrease safety awareness, decreased functional mobility, decreased functional transfers    Patient will continue to benefit from skilled OT services in  order to return to PLOF.       Goals:   In 2to 3visits:  1. Patient will donn/doff socks with Moderate assistand use of AE/AT prn. ONGOING  2. Patient will thread pants/undergarments over knees with Moderate assistand use of AE/AT/AD prn. ONGOING  3. Patient will stand and arrange pants/undergarments over hips with Moderate assistand use of AE/AT/AD prn. ONGOING  4. Patient will donn/doff shirt/gown with Minimal assistand use of AT/AE prn. MET 12/02/16  5. Patient will perform BSC/chair transfer with Supervisionand use of AD/DME prn. ONGOING  6. Patient will perform toilet transfer with Minimal assistand use of AD/DME prn. MET 12/02/16  9. Patient will perform 2grooming tasks with Minimal assistwhile standingand use of AE/AT prn. ONGOING    LTG (by d/c):  1. Patient will be Supervision Minimal assistfor ADLs, Supervisionfor functional transfers/mobility with use of AD/DME/AT/AE. ONGOING      Plan:   Treatment/interventions: ADL retraining, functional transfer training, UE strengthening/ROM, activity pacing, patient/family training, neuro muscular re-education, fine motor coordination activities, compensatory technique education    Treatment Frequency: OT Frequency Recommended: 4-5x/wk     DISCHARGE RECOMMENDATIONS   DME recommended for Discharge:   TBD closer to d/c  TBD at next level of care    Discharge Recommendations:   SNF   Pending medical progress    Subjective:   "If I move too fast I get a bladder spasm."  Patient is agreeable to participation in the therapy session. Nursing clears patient for therapy.    Pain:  At Rest: 8 /10  With Activity: 8/10  Location: Chest  , Groin  Interventions: Medication (see eMAR), Repositioned, Rest  Objective:   Observation of Patient:    Patient is in bed with Bed/chair alarm on, telemetry, SCD's, peripheral IV, O2 at 2 liters/minute via nasal cannula, indwelling urinary catheter            Vital Signs:   BP Supine:  144/82 mmHg  HR Supine: 89 bpm  SpO2 at  rest: 92%  on supplemental O2     Oriented to: Oriented x4  Command following: Follows multi-step commands with increased time  Alertness/Arousal: Appropriate responses to stimuli   Attention Span:Appears intact  Memory: Appears intact  Safety Awareness: minimal verbal instruction  Behavior: Cooperative  Motor planning: Decreased Initiation, Decreased Processing Speed  Coordination: Impaired Gross Motor, Impaired Fine Motor, RUE           Activities of Daily Living:   UB Dressing: Minimal assist; thread RUE, thread LUE performed seated at edge of bed      Functional Mobility:   Bed Mobility:  Supine to Sit:   Minimal assist.   Cues for Sequencing., Cues for Hand placement., HOB elevated, Bed rail used, assist at LE(s)  Sit to Supine:   Minimal assist.   Cues for Sequencing., HOB flat, Bed rail used, assist at LE(s)    Transfers:  Sit to Stand:  Minimal assist with Front wheeled walker.   Cues for Sequencing, Cues for Hand Placement  Stand to Sit:  Minimal assist.   Cues for Sequencing, Cues for Hand Placement  Lateral Stepping:  Min A for lateral stepping towards [facility] with use of RW    Participation and Activity Tolerance   Participation effort: Fair  Activity Tolerance: Tolerates 10-20 minutes of activity with multiple rests    Other Treatment Interventions:   Treatment Activities:   Not applicable          Education Provided:   Topics: Role of occupational therapy, plan of care, goals of therapy and HEP, benefits of activity, use of adaptive equipment, bed mobility with use of adaptive equipment or strategy, activity with nursing.    Individuals educated: Patient.  Method: Explanation.  Response to education: Verbalized understanding.    Team Communication:   OT communicated with: RN/LPN - Josh, RN  Raquel Sarna, RN  OT communicated regarding: Pre-session re: patient status  OT/COTA communication: via written note and verbal communication as needed.    Supine, in bed, Needs in reach, Bed/chair alarm set, No distress and  SCD's/foot pumps applied    Recommend client participate in self care activities, be OOB for all meals, and up to [facility] for toileting needs outside of and in addition to OT session.    Clyda Hurdle, OTR/L

## 2016-12-02 NOTE — Progress Notes (Addendum)
Cardiology Progress Note      Date Time: 12/02/16 8:24 AM  [name], [name]      Assessment:     Active Hospital Problems    Diagnosis   . Chronic anticoagulation   . Stroke   . CKD (chronic kidney disease)   . Former smoker, stopped smoking many years ago   . Type 2 diabetes mellitus   . HTN (hypertension)   . HLD (hyperlipidemia)   . Diabetic neuropathy   . COPD (chronic obstructive pulmonary disease)   . Bipolar disorder   . BPH (benign prostatic hyperplasia)   . Atrial fibrillation   . Chronic atrial fibrillation   . Stage 4 chronic kidney disease             Plan:     1. Remains in atrial fibrillation. Complained of bladder spasms earlier today.   2. Acute decrease in platelet count, increasing white blood cell count. No fevers. ?Complicated pneumonia of the left lower lobe, will start empiric hospital acquired pneumonia treatment.   3. Send HITT panel.   4. Hopefully to d/c foley.       Subjective:   Feels ok, afebrile.     Physical Exam:     Temp:  [97.5 F (36.4 C)-98.8 F (37.1 C)] 98.1 F (36.7 C)  Heart Rate:  [83-101] 96  Resp Rate:  [15-24] 24  BP: (140-160)/(85-97) 142/86    Wt Readings from Last 3 Encounters:   12/02/16 84.8 kg (186 lb 15.2 oz)   10/21/16 85.5 kg (188 lb 9.6 oz)   10/07/16 84 kg (185 lb 3.2 oz)            Intake/Output Summary (Last 24 hours) at 12/02/16 1552  Last data filed at 12/02/16 0400   Gross per 24 hour   Intake             2183 ml   Output             3825 ml   Net            -1642 ml       General appearance - alert, well appearing, and in no distress  Chest -poor air movement  Heart - irregularly irregular rhythm  Extremities - 1+ lower extremity edema  Abd: soft. Non-tender, non-distended,  Neuro: alert and oriented  Skin: no rashes  Psych: normal affect    Medications:     Scheduled Meds:   amiodarone 200 mg Oral BID   apixaban 5 mg Oral Q12H [place]   divalproex EC/DR tablet 500 mg Oral Q12H [place]   docusate sodium 100 mg Oral Daily   furosemide 60 mg  Intravenous BID   insulin glargine 10 Units Subcutaneous Q12H SCH   labetalol 200 mg Oral Q12H SCH   pantoprazole 40 mg Oral BID AC   polyethylene glycol 17 g Oral Daily   potassium chloride 40 mEq Oral Daily   pravastatin 40 mg Oral QPM   senna 8.6 mg Oral QHS             Labs:       Recent Labs  Lab 12/02/16  0559 12/01/16  1727 12/01/16  0101 11/30/16  0824   Glucose 107* 233* 137*  --    BUN 20 25* 29*  --    Creatinine 1.18 1.46* 1.34*  --    Sodium 134* 134* 135*  --    Potassium 3.8 3.9 3.8  --  Chloride 96* 97* 100  --    CO2 30.2* 32.3* 28.9  --    Magnesium  --   --   --  2.2     Estimated Creatinine Clearance: 63.2 mL/min (based on SCr of 1.18 mg/dL).      Recent Labs  Lab 11/26/16  0302   CHOL 70*   TRIG 78   HDL 21*   LDL 33         Recent Labs  Lab 12/01/16  1727 11/30/16  0130 11/29/16  1937  11/27/16  1205   WBC 21.0* 13.0* 13.1* More results in Results Review 14.4*   Hemoglobin 8.2* 8.0* 8.7* More results in Results Review 8.4*   Hematocrit 25.9* 25.5* 27.7* More results in Results Review 26.1*   PLT CT 95* 189 210 More results in Results Review 191   PT INR  --   --  1.3  --  1.4*   More results in Results Review = values in this interval not displayed.    No results for input(s): TROPI, CK, CKMB in the last 8760 hours.      Recent Labs  Lab 11/26/16  0302   TSH 1.81       Radiology: all results from this admission  Xr Chest 2 Views    Result Date: 11/15/2016  Cardiomegaly with cephalization of pulmonary vascular flow. No focal infiltrate, pleural effusion, or pneumothorax. ReadingStation:[email or web address]    Xr Abdomen Ap    Result Date: 11/24/2016  Findings worrisome for partial proximal or mid small bowel obstruction. ReadingStation:[email or web address]    Ct Head Wo Contrast    Result Date: 11/24/2016  1. No evidence of intracranial hemorrhage. 2. Probable subacute infarct superior LEFT frontoparietal lobe gyrus. 3. If clinically indicated, MRI may be helpful in further evaluation. ReadingStation:[email or web address]    Ct Chest Wo  Contrast    Result Date: 11/28/2016  1.  DESCENDING THORACIC AORTIC DISSECTION. 2.  LARGE LEFT EFFUSION WITH COMPLETE LEFT LOWER LOBE COLLAPSE. 3.  CARDIOMEGALY WITH PERICARDIAL FLUID. 4.  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 ReadingStation:[email or web address]    Xr Chest Ap Only    Result Date: 11/22/2016  No pneumothorax or significant acute pulmonary abnormalities. Small amount of subcutaneous emphysema in the anterior chest. ReadingStation:[email or web address]    Xr Chest Ap Portable    Result Date: 12/02/2016  1.  NO ACUTE CARDIOPULMONARY CHANGE. 2.  NO PNEUMOTHORAX. 3.  LEFT EFFUSION AND ASSOCIATED BASILAR AIRSPACE DISEASE. ReadingStation:[email or web address]    Xr Chest Ap Portable    Result Date: 12/01/2016  1.  NO CHANGE. 2.  LEFT CHEST TUBE WITHOUT PNEUMOTHORAX. 3.  LEFT EFFUSION WITH ASSOCIATED BASILAR AIRSPACE DISEASE. ReadingStation:[email or web address]    Xr Chest Ap Portable    Result Date: 11/30/2016  Stable small left pleural effusion with left basilar atelectasis or infiltrate. ReadingStation:[id number]    Xr Chest Ap Portable    Result Date: 11/29/2016  1.  Left apical chest tube in place without appreciable pneumothorax. 2.  Hazy opacification of the left base likely represents a combination of atelectasis and a small amount of pleural fluid. 3.  Unchanged cardiomegaly. ReadingStation:[email or web address]    Xr Chest Ap Portable    Result Date: 11/28/2016  1.  NO PNEUMOTHORAX AFTER CHEST TUBE PLACEMENT. 2.  LEFT EFFUSION NEARLY COMPLETELY RESOLVED. ReadingStation:[email or web address]    Xr Chest Ap Portable    Result Date: 11/28/2016  Increase left effusion and worsening airspace disease left lower lung. ReadingStation:[email or web address]    Xr Chest Ap Portable  Result Date: 11/26/2016  Stable pulmonary vascular congestion and left basilar atelectasis or infiltrate. Probable small left pleural effusion is unchanged. ReadingStation:[id number]    Xr Chest Ap Portable    Result Date: 11/24/2016  Cardiomegaly with bilateral airspace  disease or edema with small left effusion. No observed pneumothorax. Interval removal right IJ central line. ReadingStation:[email or web address]    X-ray Chest Ap Portable    Result Date: 11/23/2016  1. Pulmonary vascular congestion without overt failure 2. Left basilar opacity likely combination of atelectasis, consolidation and small effusion. 3. Catheter appliances as above ReadingStation:[email or web address]    Xr Abdomen Portable    Result Date: 11/25/2016  No acute abnormality. [place], DO

## 2016-12-02 NOTE — Progress Notes (Signed)
[facility] RAPID RESPONSE SEPSIS     [name] is a  66 y.o.  male admitted 11/22/2016 with   1. Chronic atrial fibrillation    2. Atrial fibrillation, unspecified type    3. Stage 4 chronic kidney disease    4. Chronic atrial fibrillation    5. Stage 4 chronic kidney disease      The Rapid Response Team was activated  on 12/02/2016 for sepsis screening. Vital signs are:   Temperature 98.1 F (36.7 C) (Oral), heart rate  (!) 107, blood pressure 160/82, respirations 25, pulse ox 97%.         The Rapid Response Team was initiated to see your patient on 12/02/2016.  Based on the clinical situation, the Sepsis protocol was initiated.         Systemic Inflammatory Response Syndrome (select 2 or more to continue screening)   Hyperthermia (Temperature > 38.3C)  No   Hypothermia (Temperature < 36 C)  No   Pulse > 90   Yes   Respirations > 20  Yes   WBC > 12   or    WBC < 4    or   WBC wnl with > 10% bands  Yes    ASSESS INFECTION RISK/SCREENING:(1 or more to continue screening)   Known or suspected infection documented in physician progress notes?   Yes   Is the patient receiving intravenous antibiotic, antifungal, or other anti-infective therapy at any point in the past 24 hours?  Yes   Has the patient had any invasive procedure in the past 30 days?   Yes    Sepsis Evaluation   Lactic acid  Yes   Labs (BMP, LFTs, CBC w/diff, PT/INR & PTT)   Yes   Blood cultures:  Peripheral Stick -  2 sticks    Urinalysis  Yes    Patient has flagged for BPA sepsis screen.  Screen is positive.  I spoke to M. Lapp PA and will run a panel.  He has been started on Vancomycin and zosyn earlier for rising WBC.  RRT will follow.    Andreas Blower, RN  Rapid Response Team Nurse

## 2016-12-02 NOTE — Progress Notes (Signed)
[place] DAILY PROGRESS NOTE      [name]  Attending Physician: [name], MD    [name] REVIEWED INFORMATION BELOW, DISCUSSED RELEVANT POINTS WITH PATIENTS AND PERSONALLY DEVELOPED PLAN OF CARE.    Assessment/Plan:   POD # 10S/p hybrid left ablation   24 hours Elevated WBC, CXR with Persistent left effusion and basilar airspace disease.  BP 142/86   Pulse 96   Temp 98.1 F (36.7 C) (Oral)   Resp (!) 24   Ht 1.702 m (5\' 7" )   Wt 84.8 kg (186 lb 15.2 oz)   SpO2 97%   BMI 29.28 kg/m   Dry weight. 85kg  I/O 24 hour net: Negative 1.36 L  Cardiac: Afib, eliquis started yesterday, heparin DCd. Cardioversion once stable on oral anticoagulants per Cardiology.   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 We will keep blood pressures low, on PO labetalol. Will need to follow up on this.  Respiratory: CXR: Persistent left effusion and basilar airspace disease. Chest tube out. WBC increasing, empiric antibiotics started for hospital acquired pneumonia   Extremities: right upper extremity swelling improved. Trace LE edema.  Renal: creatinine 1.18  back at baseline. continue diuresis per nephrology   Neuro: may need MRI in future. Right arm movement and strength improved, 5/5 and symmetric. Grip strength 5/5 symmetric.   Heme: H/H 8.5 27.1 PLT 11 (95 yesterday) will continue to monitor PLT count, HITT panel negative  ID: WBC increasing 28 today (21 yesterday). Pt remains afebrile. Empiric antibiotics started for Hospital acquired pneumonia. Will send UA.     Dispo: CM working on eventual discharge to SNF. Antibiotics started for hospital acquired pneumonia, may need ID consult.       Subjective     Pt c/o incision pain at chest and chest tube site. Denies SOB.     Vital Signs:   Vitals:  Temp: 98.1 F (36.7 C)  Heart Rate: 96  BP: 142/86  SpO2: 97 %  O2 Flow Rate (L/min): 2 L/min    Pre-Op Weight: 85.3 kg (188 lb) (11/15/16 1033)  Post-Op Weight: 84.8 kg (186 lb  15.2 oz) (12/02/16 0335)    Recent Labs  Lab 12/02/16  0941 12/02/16  0759 12/02/16  0333 12/01/16  2050 12/01/16  1737   Glucose, POCT 262* 110* 123* 275* 239*         Intake/Output Summary (Last 24 hours) at 12/02/16 1125  Last data filed at 12/02/16 1016   Gross per 24 hour   Intake             2423 ml   Output             5350 ml   Net            -2927 ml       Physical Exam:     General: awake, alert, no acute distress  Neck: supple  Cardiovascular:afib, normal S1,S2 no murmurs, rubs or gallops. Incision c/d/i  Lungs: decreased breath sounds on left, slightly increased respiratory effort. NC 2L  Abdomen:  normoactive bowel sounds, soft, non-tender, non-distended  Extremities: no clubbing, cyanosis, Right upper extremity swelling improved. Trace LE edema.  Neuro: Cranial nerves II-XII are grossly intact.  Motor and sensory function of the upper and lower extremities are grossly intact.  Right arm movement and strength improved, 5/5 and symmetric. Grip strength 5/5 symmetric.     Meds:     Current Facility-Administered Medications  Medication Dose Route Frequency   . amiodarone  200 mg Oral BID   . apixaban  5 mg Oral Q12H [place]   . divalproex EC/DR tablet  500 mg Oral Q12H [place]   . docusate sodium  100 mg Oral Daily   . furosemide  60 mg Intravenous BID   . insulin glargine  10 Units Subcutaneous Q12H SCH   . labetalol  200 mg Oral Q12H SCH   . lactobacillus species  50 Billion CFU Oral Daily   . pantoprazole  40 mg Oral BID AC   . piperacillin-tazobactam  3.375 g Intravenous Q8H   . polyethylene glycol  17 g Oral Daily   . potassium chloride  40 mEq Oral Daily   . pravastatin  40 mg Oral QPM   . senna  8.6 mg Oral QHS   . vancomycin  1,000 mg Intravenous Q12H     Current Facility-Administered Medications   Medication Dose Route Frequency Last Rate     Current Facility-Administered Medications   Medication Dose Route   . albuterol  2.5 mg Nebulization   . belladonna-opium  30 mg Rectal   . dextrose  125 mL  Intravenous   . glucagon (rDNA)  1 mg Intramuscular   . HYDROcodone-acetaminophen  1 tablet Oral   . insulin aspart  2-24 Units Subcutaneous   . ondansetron  4 mg Oral    Or   . ondansetron  4 mg Intravenous   . promethazine  25 mg Oral    Or   . promethazine  12.5 mg Rectal    Or   . promethazine  6.25 mg Intramuscular   . traMADol  50 mg Oral             Labs:       Recent Labs  Lab 12/02/16  0559 12/01/16  1727 12/01/16  0101 11/30/16  0824 11/30/16  0130 11/29/16  0504   Sodium 134* 134* 135*  --  139 139   Potassium 3.8 3.9 3.8  --  4.3 4.7   Chloride 96* 97* 100  --  106 106   CO2 30.2* 32.3* 28.9  --  28.2 30.3*   BUN 20 25* 29*  --  28* 25*   Creatinine 1.18 1.46* 1.34*  --  1.42* 1.35*   EGFR 63 49* 54*  --  51* 54*   Calcium 8.3* 8.4* 8.0*  --  8.2* 8.4*   Magnesium  --   --   --  2.2  --   --        Recent Labs  Lab 12/02/16  0923 12/01/16  1727 11/30/16  0130   WBC 28.0* 21.0* 13.0*   RBC 2.91* 2.80* 2.69*   Hemoglobin 8.5* 8.2* 8.0*   Hematocrit 27.1* 25.9* 25.5*   MCV 93 93 95   PLT CT 111* 95* 189       Recent Labs  Lab 11/29/16  1937 11/27/16  1205   PT 13.0* 14.1*   PT INR 1.3 1.4*       Recent Labs  Lab 11/26/16  0302   Cholesterol 70*   Triglycerides 78   HDL 21*   LDL Calculated 33       Recent Labs  Lab 12/02/16  0559  11/28/16  1627   Protein, Total  --   --  6.1   Albumin 2.3* More results in Results Review  --    More results in Results Review = values  in this interval not displayed.    Radiology:     Radiology Results (24 Hour)     Procedure Component Value Units Date/Time    XR Chest AP Portable [[id number]] Collected:  12/02/16 0743    Order Status:  Completed Updated:  12/02/16 0745    Narrative:       Clinical History:  Reason For Exam: [place] chest tube. left pleural effusion. post op hybrid ablation and pericardiocentesis    Examination:  XR CHEST AP PORTABLE    Comparison:  1 day prior    Technique:  Portable AP    Findings:  Unchanged cardiomegaly.  No left-sided pneumothorax.  Persistent  left effusion and basilar airspace disease.  Right lung clear.  No acute bony abnormality.      Impression:       1.  NO ACUTE CARDIOPULMONARY CHANGE.  2.  NO PNEUMOTHORAX.  3.  LEFT EFFUSION AND ASSOCIATED BASILAR AIRSPACE DISEASE.    ReadingStation:[email or web address]          Leanor Kail, [place]  Date: December 02, 2016  Time: 11:25 AM    Please feel free to contact me or [name] for any further questions or concerns.    [facility]  [phone]

## 2016-12-02 NOTE — Progress Notes (Signed)
Discharge Planner Progress Note  [facility]   [address]   [place] [address]        12/02/16 1413   CM Review   CM Comments 12/02/16:  DCP-  Met with patient in room, discussed rehab placement, levels of care and insurance coverage.  Patient states he does not have a specific facility preference, but is willing to consider any bed offers.  Referrals sent to [facility] via [place].  Will need PAS.  DCP following.         Case Update/Barriers:  Answered patient's questions and concerns, patient is agreeable to SNF placement.     Alcide Evener, Arita Miss, [facility]  Discharge Planner Social Worker  [phone]

## 2016-12-03 ENCOUNTER — Inpatient Hospital Stay: Payer: Medicare Other

## 2016-12-03 LAB — CBC AND DIFFERENTIAL
Basophils %: 0 % (ref 0.0–3.0)
Basophils Absolute: 0 10*3/uL (ref 0.0–0.3)
Eosinophils %: 2 % (ref 0.0–7.0)
Eosinophils Absolute: 0.5 10*3/uL (ref 0.0–0.8)
Hematocrit: 29.6 % — ABNORMAL LOW (ref 39.0–52.5)
Hemoglobin: 9.2 gm/dL — ABNORMAL LOW (ref 13.0–17.5)
Lymphocytes Absolute: 1.1 10*3/uL (ref 0.6–5.1)
Lymphocytes: 4 % — ABNORMAL LOW (ref 15.0–46.0)
MCH: 29 pg (ref 28–35)
MCHC: 31 gm/dL — ABNORMAL LOW (ref 32–36)
MCV: 94 fL (ref 80–100)
MPV: 7.6 fL (ref 6.0–10.0)
Monocytes Absolute: 2.2 10*3/uL — ABNORMAL HIGH (ref 0.1–1.7)
Monocytes: 8 % (ref 3.0–15.0)
Neutrophils %: 86 % — ABNORMAL HIGH (ref 42.0–78.0)
Neutrophils Absolute: 23.6 10*3/uL — ABNORMAL HIGH (ref 1.7–8.6)
PLT CT: 229 10*3/uL (ref 130–440)
RBC: 3.16 10*6/uL — ABNORMAL LOW (ref 4.00–5.70)
RDW: 17.2 % — ABNORMAL HIGH (ref 11.0–14.0)
WBC: 27.4 10*3/uL — ABNORMAL HIGH (ref 4.0–11.0)

## 2016-12-03 LAB — VH DEXTROSE STICK GLUCOSE
Glucose POCT: 147 mg/dL — ABNORMAL HIGH (ref 71–99)
Glucose POCT: 107 mg/dL — ABNORMAL HIGH (ref 71–99)
Glucose POCT: 170 mg/dL — ABNORMAL HIGH (ref 71–99)

## 2016-12-03 LAB — BASIC METABOLIC PANEL
Anion Gap: 13.8 mMol/L (ref 7.0–18.0)
BUN / Creatinine Ratio: 14.3 Ratio (ref 10.0–30.0)
BUN: 17 mg/dL (ref 7–22)
CO2: 33.1 mMol/L — ABNORMAL HIGH (ref 20.0–30.0)
Calcium: 9.2 mg/dL (ref 8.5–10.5)
Chloride: 93 mMol/L — ABNORMAL LOW (ref 98–110)
Creatinine: 1.19 mg/dL (ref 0.80–1.30)
EGFR: 63 mL/min/{1.73_m2} (ref 60–150)
Glucose: 136 mg/dL — ABNORMAL HIGH (ref 71–99)
Osmolality Calc: 276 mOsm/kg (ref 275–300)
Potassium: 3.9 mMol/L (ref 3.5–5.3)
Sodium: 136 mMol/L (ref 136–147)

## 2016-12-03 LAB — RENAL FUNCTION PANEL
Albumin: 2.4 gm/dL — ABNORMAL LOW (ref 3.5–5.0)
Anion Gap: 12.8 mMol/L (ref 7.0–18.0)
BUN / Creatinine Ratio: 13.7 Ratio (ref 10.0–30.0)
BUN: 16 mg/dL (ref 7–22)
CO2: 32.2 mMol/L — ABNORMAL HIGH (ref 20.0–30.0)
Calcium: 8.8 mg/dL (ref 8.5–10.5)
Chloride: 95 mMol/L — ABNORMAL LOW (ref 98–110)
Creatinine: 1.17 mg/dL (ref 0.80–1.30)
EGFR: 64 mL/min/{1.73_m2} (ref 60–150)
Glucose: 116 mg/dL — ABNORMAL HIGH (ref 71–99)
Osmolality Calc: 274 mOsm/kg — ABNORMAL LOW (ref 275–300)
Phosphorus: 3.5 mg/dL (ref 2.3–4.7)
Potassium: 4 mMol/L (ref 3.5–5.3)
Sodium: 136 mMol/L (ref 136–147)

## 2016-12-03 MED ORDER — FUROSEMIDE 10 MG/ML IJ SOLN
40.0000 mg | Freq: Two times a day (BID) | INTRAMUSCULAR | Status: DC
Start: 2016-12-04 — End: 2016-12-09
  Administered 2016-12-04 – 2016-12-09 (×10): 40 mg via INTRAVENOUS
  Filled 2016-12-03 (×12): qty 4

## 2016-12-03 NOTE — Progress Notes (Signed)
Cardiology Progress Note      Date Time: 12/03/16 5:29 PM  [name], [name]      Assessment:     Active Hospital Problems    Diagnosis   . Chronic anticoagulation   . Stroke   . CKD (chronic kidney disease)   . Former smoker, stopped smoking many years ago   . Type 2 diabetes mellitus   . HTN (hypertension)   . HLD (hyperlipidemia)   . Diabetic neuropathy   . COPD (chronic obstructive pulmonary disease)   . Bipolar disorder   . BPH (benign prostatic hyperplasia)   . Atrial fibrillation   . Chronic atrial fibrillation   . Stage 4 chronic kidney disease             Plan:     1. Platelet count has improved, WBCs remain elevated. No localizing source of infection. Although no diarrhea ? C.difficle? He complains of bladder spasms and back pain predominantly.   2. If everything progresses, then TEE Guided cardioversion next week.   3. Appreciate the input of Dr. Fernande Boyden, [facility] and the CT surgical team (Mr. Jeral Fruit).       Subjective:   Feels lousy due to back pain.     Physical Exam:     Temp:  [96.4 F (35.8 C)-98.8 F (37.1 C)] 96.6 F (35.9 C)  Heart Rate:  [77-105] 92  Resp Rate:  [12-19] 16  BP: (138-168)/(92-112) 154/102    Wt Readings from Last 3 Encounters:   12/03/16 81.5 kg (179 lb 10.8 oz)   10/21/16 85.5 kg (188 lb 9.6 oz)   10/07/16 84 kg (185 lb 3.2 oz)            Intake/Output Summary (Last 24 hours) at 12/03/16 1729  Last data filed at 12/03/16 0500   Gross per 24 hour   Intake              920 ml   Output             1300 ml   Net             -380 ml       General appearance - alert, well appearing, and in no distress  Chest -clear to auscultation, no wheezes, rales or rhonchi, symmetric air entry  Heart - irregularly irregular rhythm  Extremities - peripheral pulses normal, no pedal edema, no clubbing or cyanosis  Abd: soft. Non-tender, non-distended,  Neuro: alert and oriented  Skin: no rashes  Psych: normal affect    Medications:     Scheduled Meds:   amiodarone 200 mg Oral BID    apixaban 5 mg Oral Q12H [place]   divalproex EC/DR tablet 500 mg Oral Q12H [place]   docusate sodium 100 mg Oral Daily   [START ON 12/04/2016] furosemide 40 mg Intravenous BID   insulin glargine 10 Units Subcutaneous Q12H SCH   labetalol 200 mg Oral Q12H SCH   lactobacillus species 50 Billion CFU Oral Daily   pantoprazole 40 mg Oral BID AC   piperacillin-tazobactam 3.375 g Intravenous Q8H   polyethylene glycol 17 g Oral Daily   potassium chloride 40 mEq Oral Daily   pravastatin 40 mg Oral QPM   senna 8.6 mg Oral QHS   vancomycin 1,000 mg Intravenous Q12H   vancomycin therapy placeholder  Does not apply See Admin Instructions             Labs:       Recent Labs  Lab 12/03/16  1109 12/03/16  0534 12/02/16  1407 12/02/16  0559  11/30/16  0824   Glucose 136* 116*  --  107* More results in Results Review  --    BUN 17 16  --  20 More results in Results Review  --    Creatinine 1.19 1.17  --  1.18 More results in Results Review  --    Sodium 136 136  --  134* More results in Results Review  --    Potassium 3.9 4.0  --  3.8 More results in Results Review  --    Chloride 93* 95*  --  96* More results in Results Review  --    CO2 33.1* 32.2*  --  30.2* More results in Results Review  --    Magnesium  --   --   --   --   --  2.2   AST (SGOT)  --   --  18  --   --   --    ALT  --   --  190*  --   --   --    More results in Results Review = values in this interval not displayed.  Estimated Creatinine Clearance: 61.6 mL/min (based on SCr of 1.19 mg/dL).      Recent Labs  Lab 11/26/16  0302   CHOL 70*   TRIG 78   HDL 21*   LDL 33         Recent Labs  Lab 12/03/16  1109 12/02/16  1407 12/02/16  0923 12/01/16  1727  11/29/16  1937  11/27/16  1205   WBC 27.4*  --  28.0* 21.0* More results in Results Review 13.1* More results in Results Review 14.4*   Hemoglobin 9.2*  --  8.5* 8.2* More results in Results Review 8.7* More results in Results Review 8.4*   Hematocrit 29.6*  --  27.1* 25.9* More results in Results Review 27.7* More results in  Results Review 26.1*   PLT CT 229  --  111* 95* More results in Results Review 210 More results in Results Review 191   PT INR  --  1.4*  --   --   --  1.3  --  1.4*   More results in Results Review = values in this interval not displayed.    No results for input(s): TROPI, CK, CKMB in the last 8760 hours.      Recent Labs  Lab 11/26/16  0302   TSH 1.81       Surgery: all results from this admission  Procedure(s):  PERICARDIOCENTESIS (Left)      [name], DO

## 2016-12-03 NOTE — Progress Notes (Signed)
Pharmacy Vancomycin Dosing Consult Note  [name] [facility]    Assessment:   1. Day # 2 Vancomycin for HAP/positive sepsis screening for 31 yoM.  Also on Zosyn. POD #11 s/p hybrid left ablation.  CXR persistent left effusion and basilar airspace disease.    2. Creatinine improved and is stable today, leukocytosis, afebrile.  Chest tube out, complains of incision pain at chest tube site. Blood cultures in progress.      Plan:   1. Continue Vancomycin 1 gm IV q12h.    2. Trough scheduled for 4/28 at 1000.  3. Pharmacy will follow the patient's renal function, vancomycin levels, and dosing during the course of therapy. If you have any questions, please contact the pharmacist at [phone].     Indication: HAP/positive sepsis screening.    Age: 66 y.o.  Height: 1.702 m (5\' 7" )  Weight:  81.5 kg (179 lb 10.8 oz)  IBW: 66 kg  DW: N/A    CrCL:  63 ml/min    Other Nephrotoxic Drugs: furosemide, protonix, Zosyn, pravastatin    Drug Levels:   Trough level: due 4/28 at 1000     Pharmacokinetics:   t50: 12 hrs         Kel: 0.05669 hr-1        Vd: 60 L         Expected Trough: 17 mg/L                Recent Labs  Lab 12/03/16  0534 12/02/16  0559 12/01/16  1727 12/01/16  0101   Creatinine 1.17 1.18 1.46* 1.34*   BUN 16 20 25* 29*       Recent Labs  Lab 12/02/16  0923 12/01/16  1727 11/30/16  0130 11/29/16  1937   WBC 28.0* 21.0* 13.0* 13.1*     Temp (24hrs), Avg:97.7 F (36.5 C), Min:96.4 F (35.8 C), Max:98.8 F (37.1 C)      Cultures:   Date Source Organism Sensitivities Resistance   4/26 Blood x 2 In progress

## 2016-12-03 NOTE — Plan of Care (Signed)
Problem: Moderate/High Fall Risk Score >5  Goal: Patient will remain free of falls   12/03/16 1839   OTHER   Moderate Risk (6-13) LOW-Fall Interventions Appropriate for Low Fall Risk;LOW-Anticoagulation education for injury risk;LOW-(VH Only) Continuous video monitoring     Smart Goals:  1.  Patient will not fall during shift.

## 2016-12-03 NOTE — Progress Notes (Signed)
Discharge Planner Progress Note  [facility]   [address]   [place] [address]        12/03/16 1509   Case Management Quick Doc   CM Comments 12/03/16:  DCP- Updated by PA that patient has elevated WBCs and being worked up for ? Pneumonia.  DCP will f/u on Monday.  Facilities are reviewing, DCP determined preferred facility is [facility].         Alcide Evener, Arita Miss, [facility]  Discharge Planner Social Worker  [phone]

## 2016-12-03 NOTE — Progress Notes (Signed)
[place] DAILY PROGRESS NOTE      [name]  Attending Physician: [name], MD    [name] REVIEWED INFORMATION BELOW, DISCUSSED RELEVANT POINTS WITH PATIENTS AND PERSONALLY DEVELOPED PLAN OF CARE.    Assessment/Plan:   POD #  11  S/p hybrid afib ablation       Dispo: Hx of hybrid afib ablation procedure # 11, recent pericardial and Lt. Pleural effusion drainage , back in afib with elevation in WBC yesterday WBC 28, pending for today creatinine 1.17 Renal  Following   , CXR R/O Pneumonia, started ob antibiotics yesterday CXRE with Lt. Effusion and basilar atelectasis , T max 98.8 . Bladder scan this am       Subjective          HPI/Subjective: 66 y.o. year old male with above stated PMH     Vital Signs:   Vitals:  Temp: 97.3 F (36.3 C)  Heart Rate: 89  BP: (!) 168/112  SpO2: 95 %  O2 Flow Rate (L/min): 2 L/min    Pre-Op Weight: 85.3 kg (188 lb) (11/15/16 1033)  Post-Op Weight: 81.5 kg (179 lb 10.8 oz) (12/03/16 0500)    Recent Labs  Lab 12/02/16  2228 12/02/16  1705 12/02/16  0941 12/02/16  0759 12/02/16  0333   Glucose, POCT 129* 153* 262* 110* 123*         Intake/Output Summary (Last 24 hours) at 12/03/16 0843  Last data filed at 12/03/16 0500   Gross per 24 hour   Intake             1160 ml   Output             4150 ml   Net            -2990 ml       Physical Exam:     General: awake, alert, no acute distress, pleasant, comfortable  Neck: supple  Cardiovascular: afib   Lungs:Decreased BS, Lt. Base   Abdomen:  normoactive bowel sounds, soft, non-tender, non-distended  Extremities: no clubbing, cyanosis, or edema.  Neuro: Cranial nerves II-XII are grossly intact.  Motor and sensory function of the upper and lower extremities are grossly intact.    Meds:     Current Facility-Administered Medications   Medication Dose Route Frequency   . amiodarone  200 mg Oral BID   . apixaban  5 mg Oral Q12H [place]   . divalproex EC/DR tablet  500 mg Oral Q12H [place]   . docusate sodium   100 mg Oral Daily   . furosemide  60 mg Intravenous BID   . insulin glargine  10 Units Subcutaneous Q12H SCH   . labetalol  200 mg Oral Q12H SCH   . lactobacillus species  50 Billion CFU Oral Daily   . pantoprazole  40 mg Oral BID AC   . piperacillin-tazobactam  3.375 g Intravenous Q8H   . polyethylene glycol  17 g Oral Daily   . potassium chloride  40 mEq Oral Daily   . pravastatin  40 mg Oral QPM   . senna  8.6 mg Oral QHS   . vancomycin  1,000 mg Intravenous Q12H   . vancomycin therapy placeholder   Does not apply See Admin Instructions     Current Facility-Administered Medications   Medication Dose Route Frequency Last Rate     Current Facility-Administered Medications   Medication Dose Route   . albuterol  2.5 mg Nebulization   .  belladonna-opium  30 mg Rectal   . dextrose  125 mL Intravenous   . glucagon (rDNA)  1 mg Intramuscular   . HYDROcodone-acetaminophen  1 tablet Oral   . insulin aspart  2-24 Units Subcutaneous   . ondansetron  4 mg Oral    Or   . ondansetron  4 mg Intravenous   . promethazine  25 mg Oral    Or   . promethazine  12.5 mg Rectal    Or   . promethazine  6.25 mg Intramuscular   . traMADol  50 mg Oral             Labs:       Recent Labs  Lab 12/03/16  0534 12/02/16  0559 12/01/16  1727 12/01/16  0101 11/30/16  0824 11/30/16  0130   Sodium 136 134* 134* 135*  --  139   Potassium 4.0 3.8 3.9 3.8  --  4.3   Chloride 95* 96* 97* 100  --  106   CO2 32.2* 30.2* 32.3* 28.9  --  28.2   BUN 16 20 25* 29*  --  28*   Creatinine 1.17 1.18 1.46* 1.34*  --  1.42*   EGFR 64 63 49* 54*  --  51*   Calcium 8.8 8.3* 8.4* 8.0*  --  8.2*   Magnesium  --   --   --   --  2.2  --        Recent Labs  Lab 12/02/16  0923 12/01/16  1727 11/30/16  0130   WBC 28.0* 21.0* 13.0*   RBC 2.91* 2.80* 2.69*   Hemoglobin 8.5* 8.2* 8.0*   Hematocrit 27.1* 25.9* 25.5*   MCV 93 93 95   PLT CT 111* 95* 189       Recent Labs  Lab 12/02/16  1407 11/29/16  1937 11/27/16  1205   PT 14.0* 13.0* 14.1*   PT INR 1.4* 1.3 1.4*            Recent Labs  Lab 12/03/16  0534 12/02/16  1407   Bilirubin, Total  --  1.1   Bilirubin, Direct  --  0.6*   Protein, Total  --  5.7*   Albumin 2.4* 2.5*   ALT  --  190*   AST (SGOT)  --  18       Radiology:     Radiology Results (24 Hour)     Procedure Component Value Units Date/Time    XR Chest AP Portable [[id number]] Collected:  12/03/16 0759    Order Status:  Completed Updated:  12/03/16 0802    Narrative:       Clinical History:  Left pleural effusion    Examination:  Frontal view of the chest.    Comparison:  02 December 2016    Findings:  Heart size is stable, as are a small left pleural effusion and left basilar airspace disease. The right lung is essentially clear.      Impression:       No significant change    ReadingStation:[email or web address]          Rosanne Sack, [place]  Date: December 03, 2016  Time: 8:43 AM    Please feel free to contact me or [name] for any further questions or concerns.    [facility]  [phone]

## 2016-12-03 NOTE — PT Progress Note (Signed)
[facility]: [facility]  Department of Rehabilitation Services: [phone]  Brennan Litzinger    CSN: [id number]    SURG TELEMETRY STEP-DOWN   330/330-A    Physical Therapy Treatment Note    Time of treatment:   Time Calculation  PT Received On: 12/03/16  Start Time: 3009  Stop Time: 0959  Time Calculation (min): 17 min    Visit#: 3    Last seen by Physical therapist vs. PTA: 11/30/16    Medical Diagnosis/Pertinent medical/surgical details:    hybrid convergent atrial ablation procedure on 11/22/16. On 11/25/16, the patient was transferred to the ICU with shortness of breath. The patient had a large pericardial effusion drained on 11/25/16. The patient is also presenting with right sided weakness, particularly on the right upper extremity. 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     Precautions and Contraindications:  BP Precautions: Systolic [phone]  Falls    Assessment:   Patient's progress towards established goals: patient able to perform sit stand transfers with a FWW (S), SPT CGA for safety;progressed ambulation distance 20' using a FWW with minimal (A)     Patient continues to have the following impairments: decreased strength, decreased activity tolerance, decreased functional mobility, decreased balance, gait deficits    Patient will continue to benefit from skilled PT services in order to progress strength, gait quality/endurance, and functional independence for safe return home       Goals:   To be completed by discharge:  1. The patient will complete all bed mobility with supervision in order to prepare to transfer. ONGOING  2. The patient will complete sit to stand and stand to sit transfers with supervision in order to prepare to ambulate. MET  3. The patient will ambulate 150 feet with supervision with LRAD in order to demonstrate independence with mobility. ONGOING  4. The patient will ascend/descend 7 stairs with 2 rails with  supervision in order to demonstrate the ability to get into his home. ONGOING       Plan:   Treatment/interventions: Exercise, Gait training, Stair training, Functional transfer training, LE strengthening/ROM, Bed mobility    Treatment Frequency: 4-5x/wk    DISCHARGE RECOMMENDATIONS   DME recommended for Discharge:   TBD at next level of care    Discharge Recommendations:   SNF         Subjective:   "I feel better (once standing)."   Patient is agreeable to participation in the therapy session. Nursing clears patient for therapy.    Pain:  At Rest: 5 /10  With Activity: 5/10  Location: Abdomen, Generalized  Interventions: RN/LPN aware    OBJECTIVE:   Observation of Patient/Vital Signs:   Patient is seated in a bedside chair with Bed/chair alarm on, telemetry, O2 at 2 liters/minute via nasal cannula  Patient's medical condition is appropriate for Physical therapy intervention at this time.    Vital Signs:  BP Sitting: 149/90 mmHg  SpO2 at rest: 98%    Edema: present    Oriented to: Oriented x4  Command following: Follows multi-step commands with increased time, Follows multi-step commands with repetition  Alertness/Arousal: Appropriate responses to stimuli   Attention Span:Attends to task with redirection  Memory: Appears intact  Safety Awareness: minimal verbal instruction    Musculoskeletal and Balance Details:   Balance:  Static Sitting:  Good  Static Standing:  stands with FWW and (S)  Bed Mobility:   Seated Scooting:   Supervision    Transfers:  Sit to Stand:  Supervision with Front wheeled walker.         Stand to Sit:  Supervision.    Cues for Hand Placement  Stand Pivot:   CGA for safety with Front wheeled walker.   Cues for Sequencing, Cues for Walker Management    Locomotion:  LEVEL AMBULATION:  Distance: 20'   Assistance level:  Minimal assist  Device:  Front wheeled walker  Pattern:  Reciprocal, Decreased cadence, Decreased step length:  bilaterally, Decreased clearance:  bilaterally  Distance  limited by: endurance    Participation and Activity Tolerance   Participation effort: Good  Activity Tolerance: Tolerates 10-20 minutes of activity with multiple rests    Other Treatment Interventions this session:   Therapeutic exercise:  Sitting:  Long Arc Quads:  10x   Heel/Toe raises:  10x   Therapeutic activity  Clinical biochemist education     Education Provided:   TOPICS: role of physical therapy, plan of care, goals of therapy and HEP, safety with mobility and ADLs, benefits of activity, activity with nursing, encouraged to call for assist when having the urge to urinate;upon standing determined patients brief and chair pad soaked in urine;patient reports being able to feel the need but didn't think there was enough time;instructed to call and keep urinal close    Learner educated: Patient  Method: Explanation  Response to education: Verbalized understanding and Needs reinforcement    Patient Position at End of Treatment:   Sitting, in a chair, Needs in reach, Bed/chair alarm set and No distress    Team Communication:   Spoke to : RN/LPN - Josh/orientee  Regarding: Pre-session re: patient status, Patient position at end of session, Patient participation with Therapy, aware patient needing to be cleaned up  Whiteboard updated: left as stated  PT/PTA communication: via written note and verbal communication as needed.      Recommend patient up to chair and walk in room FWW (A) x 1 outside of PT sessions.    Dede Query

## 2016-12-03 NOTE — Progress Notes (Signed)
Renal Progress Note    Follow up for:  ARF  CKD, stage 3    Subjective:     Diuresing well on current regimen  Weight is gradually improving  PVR's reportedly < 100 but not recorded in I/O flow sheet      Objective:     Vitals: BP (!) 138/92   Pulse (!) 105   Temp (!) 96.4 F (35.8 C) (Axillary)   Resp 19   Ht 1.702 m (5\' 7" )   Wt 81.5 kg (179 lb 10.8 oz)   SpO2 96%   BMI 28.14 kg/m       I/O:     Intake/Output Summary (Last 24 hours) at 12/03/16 1513  Last data filed at 12/03/16 0500   Gross per 24 hour   Intake              1610 ml   Output             4150 ml   Net             -2540 ml       Weights:  Wt Readings from Last 1 Encounters:   12/03/16 0500 81.5 kg (179 lb 10.8 oz)   12/02/16 0335 84.8 kg (186 lb 15.2 oz)   12/01/16 0349 86.4 kg (190 lb 7.6 oz)   11/30/16 0600 85.3 kg (188 lb 0.8 oz)   11/29/16 0352 85.4 kg (188 lb 4.4 oz)   11/27/16 0553 88.1 kg (194 lb 3.6 oz)   11/26/16 0525 87.8 kg (193 lb 9 oz)   11/25/16 0540 86.3 kg (190 lb 4.8 oz)   11/24/16 0549 86.7 kg (191 lb 2.2 oz)   11/22/16 0608 85.6 kg (188 lb 11.4 oz)   11/15/16 1033 85.3 kg (188 lb)       Physical Exam:  General: No distress.  Lungs: CTA  Heart: RRR, no murmur, gallop, rub, or heave.  Abdomen: BS present, soft, NT/ND, no organomegaly.  Extremities: 1+ edema.  Access: N/A        Labs:  Labs reviewed.  Radiology results reviewed.  Pertinent findings below.  Recent Labs      12/03/16   1109  12/02/16   0923   WBC  27.4*  28.0*   Hemoglobin  9.2*  8.5*   Hematocrit  29.6*  27.1*   PLT CT  229  111*     Recent Labs      12/02/16   1407  12/01/16   1325  12/01/16   0101   PT  14.0*   --    --    PT INR  1.4*   --    --    aPTT  34.0  43.1*  50.9*     No results for input(s): TROPI, CK, CKMBINDEX in the last 72 hours.  No results for input(s): BNP in the last 72 hours.        Xr Chest Ap Portable    Result Date: 12/03/2016  No significant change ReadingStation:[email or web address]     Recent Labs      12/03/16   1109  12/03/16   0534  12/02/16    0559   Glucose  136*  116*  107*   Sodium  136  136  134*   Potassium  3.9  4.0  3.8   Chloride  93*  95*  96*   CO2  33.1*  32.2*  30.2*   BUN  17  16  20   Creatinine  1.19  1.17  1.18   EGFR  63  64  63   Calcium  9.2  8.8  8.3*     Recent Labs      12/03/16   0534  12/02/16   0559  12/01/16   0101   Phosphorus  3.5  3.2  3.6     Recent Labs      12/03/16   0534  12/02/16   1407  12/02/16   0559   Albumin  2.4*  2.5*  2.3*   Protein, Total   --   5.7*   --    Bilirubin, Total   --   1.1   --    Alkaline Phosphatase   --   128   --    ALT   --   190*   --    AST (SGOT)   --   18   --     Recent Labs      12/02/16   1336   Specific Gravity, UR  1.010   pH, Urine  7.0   Protein, UR  Negative   Glucose, UA  >=500*   Ketones UA  Negative   Bilirubin, UA  Negative   Blood, UA  Small*   Nitrite, UA  Negative   Urobilinogen, UA  2.0*   Leukocyte Esterase, UA  Negative   WBC, UA  1   RBC, UA  14*                  Assessment:     1. AKI - pre-renal ischemia +/- ATN resulting from pericardial effusion; peak creatinine 4.64 this morning, now running better than previous baseline owing to persistent hypervolemia and relative loss of muscle mass  2. Stage III chronic kidney disease-with apparent baseline creatinine 1.6-2.1 mg/dL; most recent creatinine value on outpatient basis had been 2.0 mg/dL on 11/15/16. Suspect underlying diabetic/hypertensive kidney disease.  3. Hyperkalemia-associated with reduced renal function and ACE inhibitor use; also component of hyperglycemia  4. Metabolic acidosis-associated with reduced renal function, resolved  5. Ileus, improved  6. Status post hybrid left atrial ablation (convergent procedure) for atrial fibrillation  7. Hypertension  8. Subacute CVA of left frontoparietal lobes  9. Type 2 diabetes mellitus      Plan:      sCr remains stable and better than previous baseline possibly owing to volume expansion and relative losses to muscle mass   Continue to diurese with downward titration  to slow rate of volume loss; creatinine may increase modestly as he approaches dry weight; will accept return to previous baseline   KCL supplements while actively diuresing; holding parameters provided   Continue with daily chemistries for now      Orders and Medications reviewed in Epic.  All elements of the note and care plan reviewed and, where text is copied/unchanged, remains as documented above.    Blanchard Mane, Pager [phone]  3:13 PM  12/03/2016  [facility]  Dr. Hurley Cisco, Dr. Josephina Shih, Dr. Fernande Boyden  Office: [phone]

## 2016-12-03 NOTE — Plan of Care (Signed)
Problem: Moderate/High Fall Risk Score >5  Goal: Patient will remain free of falls  Outcome: Progressing

## 2016-12-03 NOTE — OT Progress Note (Signed)
[facility]: [facility]  Department of Rehabilitation Services: [phone]    Khambrel Amsden    CSN#: [id number]  SURG TELEMETRY STEP-DOWN 330/330-A    Occupational Therapy Progress Note    Patient's medical condition is appropriate for Occupational therapy intervention at this time.    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 Pt is able thread brief to over feet, to knee level with modA and use of reacher; MinA needed for chair to bed transfer. OT will continue to follow pt per goals.     Patient continues to have the following impairments: decreased ROM, decreased strength, balance deficits, decreased independence with ADLs, decreased independence with IADLs, decreased activity tolerance, decreased functional mobility, decreased functional transfers    Patient will continue to benefit from skilled OT services in order to address deficits above.        Goals:   In 2to 3visits:  1. Patient will donn/doff socks with Moderate assistand use of AE/AT prn. ONGOING  2. Patient will thread pants/undergarments over knees with Moderate assistand use of AE/AT/AD prn. MET 12/03/2016  3. Patient will stand and arrange  pants/undergarments over hips with Moderate assistand use of AE/AT/AD prn. ONGOING  4. Patient will donn/doff shirt/gown with Minimal assistand use of AT/AE prn. MET 12/02/16  5. Patient will perform BSC/chair transfer with Supervisionand use of AD/DME prn. ONGOING  6. Patient will perform toilet transfer with Minimal assistand use of AD/DME prn. MET 12/02/16  9. Patient will perform 2grooming tasks with Minimal assistwhile standingand use of AE/AT prn. ONGOING    LTG (by d/c):  1. Patient will be Supervision Minimal assistfor ADLs, Supervisionfor functional transfers/mobility with use of AD/DME/AT/AE. ONGOING    Plan:   Treatment/interventions: ADL retraining, functional transfer training, UE strengthening/ROM, patient/family training, equipment eval/education, compensatory technique education    Treatment Frequency: OT Frequency Recommended: 4-5x/wk     DISCHARGE RECOMMENDATIONS   DME recommended for Discharge:   TBD closer to d/c  TBD at next level of care    Discharge Recommendations:   SNF     Subjective:   "I need to be changed before I get into bed."  Patient is agreeable to participation in the therapy session. Nursing clears patient for therapy.    Pain:  At Rest: 5, per FACES /10  With Activity: 5, per FACES/10  Location: Generalized  Interventions: Medication (see eMAR), Repositioned    Objective:   Observation of Patient:    Patient is in bed with Bed/chair alarm on, telemetry, O2 at 2 liters/minute via nasal cannula.    Edema: RUE     Vital Signs:   BP Sitting: 138/92 mmHg  HR Sitting:  91 bpm  SpO2  at rest: 97%     Command following: Follows 1 step commands with increased time, Follows 1 step commands with repetition  Alertness/Arousal: Appropriate responses to stimuli   Attention Span:Attends to task with redirection  Memory: Appears intact     Activities of Daily Living:    LB Dressing: Moderate assist, to thread briefs to knee level with use of reacher; MaxA to pull to waist level;  performed  seated in a chair, standing with  Front wheeled walker.  TOILETING:  Total assist for perineal hygiene; brief.     Functional Mobility:   Bed Mobility:  Sit to Supine:   Minimal assist.   Cues for Sequencing., Cues for Hand placement.    Transfers:  Sit to Stand:  Minimal assist with Front wheeled walker.   Cues for Sequencing, Cues for Hand Placement  Stand to Sit:  Minimal assist.   Cues for Sequencing, Cues for Hand Placement  Chair to Bed Transfer: MinA with FWW. Cues for Sequencing. Cues for Hand Placements.     Participation and Activity Tolerance   Participation effort: Good  Activity Tolerance: Tolerates 10-20 minutes of activity with multiple rests    Other Treatment Interventions:   Treatment Activities:   Equipment Training: Use of reacher for LE dressing.      Education Provided:   Topics: Role of occupational therapy, plan of care, goals of therapy and HEP, safety with mobility and ADLs, home safety, use of adaptive equipment, bed mobility with use of adaptive equipment or strategy.    Individuals educated: Patient.  Method: Explanation.  Response to education: Verbalized understanding.    Team Communication:   OT communicated with: RN/LPN - Josh  OT communicated regarding: Pre-session re: patient status, Patient position at end of session, Patient participation with Therapy  OT/COTA communication: via written note and verbal communication as needed.    Supine, in bed, Needs in reach, Bed/chair alarm set, No distress and SCD's/foot pumps applied    Recommend client sit up in the chair for meals and grooming with FWW and assist x1, outside of and in addition to OT session.    Charlaine Dalton OTR/L

## 2016-12-04 ENCOUNTER — Inpatient Hospital Stay: Payer: Medicare Other

## 2016-12-04 LAB — CBC
Hematocrit: 28.4 % — ABNORMAL LOW (ref 39.0–52.5)
Hemoglobin: 9.1 gm/dL — ABNORMAL LOW (ref 13.0–17.5)
MCH: 30 pg (ref 28–35)
MCHC: 32 gm/dL (ref 32–36)
MCV: 93 fL (ref 80–100)
MPV: 6.8 fL (ref 6.0–10.0)
PLT CT: 314 10*3/uL (ref 130–440)
RBC: 3.06 10*6/uL — ABNORMAL LOW (ref 4.00–5.70)
RDW: 17.2 % — ABNORMAL HIGH (ref 11.0–14.0)
WBC: 23 10*3/uL — ABNORMAL HIGH (ref 4.0–11.0)

## 2016-12-04 LAB — CBC AND DIFFERENTIAL
Basophils Absolute: 0 10*3/uL (ref 0.0–0.3)
Eosinophils %: 1 % (ref 0.0–7.0)
Eosinophils Absolute: 0.3 10*3/uL (ref 0.0–0.8)
Hematocrit: 27.6 % — ABNORMAL LOW (ref 39.0–52.5)
Hemoglobin: 8.6 gm/dL — ABNORMAL LOW (ref 13.0–17.5)
Lymphocytes Absolute: 1 10*3/uL (ref 0.6–5.1)
Lymphocytes: 4 % — ABNORMAL LOW (ref 15.0–46.0)
MCH: 29 pg (ref 28–35)
MCHC: 31 gm/dL — ABNORMAL LOW (ref 32–36)
MCV: 93 fL (ref 80–100)
MPV: 6.9 fL (ref 6.0–10.0)
Monocytes Absolute: 1.3 10*3/uL (ref 0.1–1.7)
Monocytes: 5 % (ref 3.0–15.0)
Neutrophils %: 90 % — ABNORMAL HIGH (ref 42.0–78.0)
Neutrophils Absolute: 22.8 10*3/uL — ABNORMAL HIGH (ref 1.7–8.6)
Nucleated RBC: 2 /100 WBCs (ref 0–10)
PLT CT: 288 10*3/uL (ref 130–440)
RBC: 2.95 10*6/uL — ABNORMAL LOW (ref 4.00–5.70)
RDW: 17.1 % — ABNORMAL HIGH (ref 11.0–14.0)
WBC: 25.3 10*3/uL — ABNORMAL HIGH (ref 4.0–11.0)

## 2016-12-04 LAB — RENAL FUNCTION PANEL
Albumin: 2.4 gm/dL — ABNORMAL LOW (ref 3.5–5.0)
Anion Gap: 12.6 mMol/L (ref 7.0–18.0)
BUN / Creatinine Ratio: 13.9 Ratio (ref 10.0–30.0)
BUN: 17 mg/dL (ref 7–22)
CO2: 32.4 mMol/L — ABNORMAL HIGH (ref 20.0–30.0)
Calcium: 8.9 mg/dL (ref 8.5–10.5)
Chloride: 94 mMol/L — ABNORMAL LOW (ref 98–110)
Creatinine: 1.22 mg/dL (ref 0.80–1.30)
EGFR: 61 mL/min/{1.73_m2} (ref 60–150)
Glucose: 123 mg/dL — ABNORMAL HIGH (ref 71–99)
Osmolality Calc: 273 mOsm/kg — ABNORMAL LOW (ref 275–300)
Phosphorus: 4.1 mg/dL (ref 2.3–4.7)
Potassium: 4 mMol/L (ref 3.5–5.3)
Sodium: 135 mMol/L — ABNORMAL LOW (ref 136–147)

## 2016-12-04 LAB — VH DEXTROSE STICK GLUCOSE
Glucose POCT: 129 mg/dL — ABNORMAL HIGH (ref 71–99)
Glucose POCT: 130 mg/dL — ABNORMAL HIGH (ref 71–99)
Glucose POCT: 247 mg/dL — ABNORMAL HIGH (ref 71–99)
Glucose POCT: 255 mg/dL — ABNORMAL HIGH (ref 71–99)
Glucose POCT: 179 mg/dL — ABNORMAL HIGH (ref 71–99)

## 2016-12-04 LAB — VANCOMYCIN, TROUGH: Vancomycin Trough: 17.23 ug/mL (ref 10.00–20.00)

## 2016-12-04 LAB — VH C. DIFFICILE TOXIN B GENE BY DNA AMPLIFICATION: Stool Clostridium difficile Toxin B Gene DNA Amplification: NEGATIVE

## 2016-12-04 NOTE — Progress Notes (Signed)
Social visit    Continue the present plan as outlined by nephrology, cardiology, CT surgery.  Thank you  Marinus Maw

## 2016-12-04 NOTE — Plan of Care (Signed)
Problem: Hemodynamic Status: Cardiac  Goal: Stable vital signs and fluid balance  Outcome: Progressing   12/04/16 2338   Goal/Interventions addressed this shift   Stable vital signs and fluid balance Monitor/assess vital signs and telemetry per unit protocol;Weigh on admission and record weight daily;Assess signs and symptoms associated with cardiac rhythm changes;Monitor intake/output per unit protocol and/or LIP order;Monitor lab values;Monitor for leg swelling/edema and report to LIP if abnormal

## 2016-12-04 NOTE — Plan of Care (Signed)
Problem: Moderate/High Fall Risk Score >5  Goal: Patient will remain free of falls  Outcome: Progressing

## 2016-12-04 NOTE — Progress Notes (Signed)
Pharmacy Vancomycin Dosing Consult Note  Bobbi Kozakiewicz [facility]    Assessment:   1. Day # 3 Vancomycin (also on pip/tazo) for HAP/positive sepsis screening for 20 yoM.  POD #11 s/p hybrid left ablation.  CXR persistent left effusion and basilar airspace disease.    2. Creatinine improved and is stable today, persistent leukocytosis steadily improving, afebrile.  Chest tube out, complains of incision pain at chest tube site. Blood cultures in progress. C.diff test ordered and not collected. Blood cultures and pleural culture in process. Trough today was drawn appropriately.     Plan:   1. Continue Vancomycin 1000 mg IV every 12 hours  2. Pharmacy will follow the patient's renal function, vancomycin levels, and dosing during the course of therapy. If you have any questions, please contact the pharmacist at [phone].     Indication: HAP/positive sepsis screening.    Age: 66 y.o.  Height: 1.702 m (5\' 7" )  Weight:  78.7 kg (173 lb 8 oz)  IBW: 66 kg  DW: N/A    CrCL:  63 ml/min    Other Nephrotoxic Drugs: furosemide, protonix, Zosyn, pravastatin    Drug Levels:   Trough level: 4/28 at 1041 = 17.23 mcg/ml (on vanco 1000 mg IV every 12 hours)     Pharmacokinetics:   t50: 12 hrs         Kel: 0.05669 hr-1        Vd: 60 L         Expected Trough: 17 mg/L                Recent Labs  Lab 12/04/16  0547 12/03/16  1109 12/03/16  0534 12/02/16  0559   Creatinine 1.22 1.19 1.17 1.18   BUN 17 17 16 20        Recent Labs  Lab 12/04/16  0547 12/03/16  1109 12/02/16  0923 12/01/16  1727   WBC 25.3* 27.4* 28.0* 21.0*     Temp (24hrs), Avg:97.5 F (36.4 C), Min:96.6 F (35.9 C), Max:97.9 F (36.6 C)      Cultures:   Date Source Organism Sensitivities Resistance   4/26 Blood x 2 In progress

## 2016-12-04 NOTE — Progress Notes (Signed)
[place] DAILY PROGRESS NOTE      [name]  Attending Physician: [name], MD    [name] REVIEWED INFORMATION BELOW, DISCUSSED RELEVANT POINTS WITH PATIENTS AND PERSONALLY DEVELOPED PLAN OF CARE.    Assessment/Plan:   POD #  12  S/p hybrid afib ablation   Doing well  Leukocytosis : improving trending down  ARF : Cr improved trending to baseline : Nephrology following  Pulm: HAP : Abx ongoing         Dispo: Overall doing better today and improved continue current care plan     Patient seen with Dr. Charise Carwin who personally evaluated and collaborated with me in all aspects of this patients care    Subjective          HPI/Subjective: 66 y.o. year old male with above stated PMH resting quietly in bed    Vital Signs:   Vitals:  Temp: 97.7 F (36.5 C)  Heart Rate: 82  BP: (!) 139/104  SpO2: 99 %  O2 Flow Rate (L/min): 2 L/min    Pre-Op Weight: 85.3 kg (188 lb) (11/15/16 1033)  Post-Op Weight: 78.7 kg (173 lb 8 oz) (12/04/16 0559)    Recent Labs  Lab 12/04/16  0754 12/04/16  0258 12/03/16  2056 12/03/16  1715 12/03/16  1201   Glucose, POCT 130* 129* 170* 107* 147*         Intake/Output Summary (Last 24 hours) at 12/04/16 1141  Last data filed at 12/03/16 2200   Gross per 24 hour   Intake              200 ml   Output                0 ml   Net              200 ml       Physical Exam:     General: awake, alert, no acute distress, pleasant, comfortable  Neck: supple  Cardiovascular: afib   Lungs:Decreased BS, [employer]. [employer]   Abdomen:  normoactive bowel sounds, soft, non-tender, non-distended  Extremities: no clubbing, cyanosis, or edema.  Neuro: Cranial nerves II-XII are grossly intact.  Motor and sensory function of the upper and lower extremities are grossly intact.    Meds:     Current Facility-Administered Medications   Medication Dose Route Frequency   . amiodarone  200 mg Oral BID   . apixaban  5 mg Oral Q12H [place]   . divalproex EC/DR tablet  500 mg Oral Q12H [place]   . docusate  sodium  100 mg Oral Daily   . furosemide  40 mg Intravenous BID   . insulin glargine  10 Units Subcutaneous Q12H SCH   . labetalol  200 mg Oral Q12H SCH   . lactobacillus species  50 Billion CFU Oral Daily   . pantoprazole  40 mg Oral BID AC   . piperacillin-tazobactam  3.375 g Intravenous Q8H   . polyethylene glycol  17 g Oral Daily   . potassium chloride  40 mEq Oral Daily   . pravastatin  40 mg Oral QPM   . senna  8.6 mg Oral QHS   . vancomycin  1,000 mg Intravenous Q12H   . vancomycin therapy placeholder   Does not apply See Admin Instructions     Current Facility-Administered Medications   Medication Dose Route Frequency Last Rate     Current Facility-Administered Medications   Medication Dose Route   .  albuterol  2.5 mg Nebulization   . belladonna-opium  30 mg Rectal   . dextrose  125 mL Intravenous   . glucagon (rDNA)  1 mg Intramuscular   . HYDROcodone-acetaminophen  1 tablet Oral   . insulin aspart  2-24 Units Subcutaneous   . ondansetron  4 mg Oral    Or   . ondansetron  4 mg Intravenous   . promethazine  25 mg Oral    Or   . promethazine  12.5 mg Rectal    Or   . promethazine  6.25 mg Intramuscular   . traMADol  50 mg Oral             Labs:       Recent Labs  Lab 12/04/16  0547 12/03/16  1109 12/03/16  0534 12/02/16  0559 12/01/16  1727  11/30/16  0824   Sodium 135* 136 136 134* 134* More results in Results Review  --    Potassium 4.0 3.9 4.0 3.8 3.9 More results in Results Review  --    Chloride 94* 93* 95* 96* 97* More results in Results Review  --    CO2 32.4* 33.1* 32.2* 30.2* 32.3* More results in Results Review  --    BUN 17 17 16 20  25* More results in Results Review  --    Creatinine 1.22 1.19 1.17 1.18 1.46* More results in Results Review  --    EGFR 61 63 64 63 49* More results in Results Review  --    Calcium 8.9 9.2 8.8 8.3* 8.4* More results in Results Review  --    Magnesium  --   --   --   --   --   --  2.2   More results in Results Review = values in this interval not displayed.    Recent  Labs  Lab 12/04/16  0547 12/03/16  1109 12/02/16  0923   WBC 25.3* 27.4* 28.0*   RBC 2.95* 3.16* 2.91*   Hemoglobin 8.6* 9.2* 8.5*   Hematocrit 27.6* 29.6* 27.1*   MCV 93 94 93   PLT CT 288 229 111*       Recent Labs  Lab 12/02/16  1407 11/29/16  1937 11/27/16  1205   PT 14.0* 13.0* 14.1*   PT INR 1.4* 1.3 1.4*           Recent Labs  Lab 12/04/16  0547  12/02/16  1407   Bilirubin, Total  --   --  1.1   Bilirubin, Direct  --   --  0.6*   Protein, Total  --   --  5.7*   Albumin 2.4* More results in Results Review 2.5*   ALT  --   --  190*   AST (SGOT)  --   --  18   More results in Results Review = values in this interval not displayed.    Radiology:     Radiology Results (24 Hour)     Procedure Component Value Units Date/Time    XR Chest AP Portable [[id number]] Collected:  12/04/16 0759    Order Status:  Completed Updated:  12/04/16 0803    Narrative:       Clinical History:  Reason For Exam: [place] chest tube. Left pleural effusion. Post op hybrid ablation and pericardiocentesis  History of small left pleural effusion and left basilar airspace disease.    Examination:  Frontal view of the chest.    Comparison:  December 03, 2016.  Findings:  Uncoiled descending thoracic aorta.  Stable cardiomegaly.  Ectatic aortic knob.  Possible prominent main pulmonary artery silhouette suggesting pulmonary arterial hypertension.  Left pleural effusion, as before.  No discernible pneumothorax.  Stable prominence right hilar silhouette.  Left retrocardiac airspace opacifications, as before.      Impression:       Findings similar to prior.    ReadingStation:[email or web address]          Estella Husk, [place]  Date: December 04, 2016  Time: 11:41 AM    Please feel free to contact me or [name] for any further questions or concerns.    [facility]  [phone]

## 2016-12-04 NOTE — Plan of Care (Signed)
Problem: Impaired Mobility  Goal: Mobility/Activity is maintained at optimal level for patient  Outcome: Progressing      Problem: Altered GI Function  Goal: Mobility/Activity is maintained at optimal level for patient  Outcome: Progressing

## 2016-12-05 ENCOUNTER — Inpatient Hospital Stay: Payer: Medicare Other

## 2016-12-05 LAB — RENAL FUNCTION PANEL
Albumin: 2.4 gm/dL — ABNORMAL LOW (ref 3.5–5.0)
Anion Gap: 9 mMol/L (ref 7.0–18.0)
BUN / Creatinine Ratio: 22 Ratio (ref 10.0–30.0)
BUN: 20 mg/dL (ref 7–22)
CO2: 37 mMol/L — ABNORMAL HIGH (ref 20.0–30.0)
Calcium: 8.9 mg/dL (ref 8.5–10.5)
Chloride: 95 mMol/L — ABNORMAL LOW (ref 98–110)
Creatinine: 0.91 mg/dL (ref 0.80–1.30)
EGFR: 87 mL/min/{1.73_m2} (ref 60–150)
Glucose: 158 mg/dL — ABNORMAL HIGH (ref 71–99)
Osmolality Calc: 280 mOsm/kg (ref 275–300)
Phosphorus: 3.4 mg/dL (ref 2.3–4.7)
Potassium: 4 mMol/L (ref 3.5–5.3)
Sodium: 137 mMol/L (ref 136–147)

## 2016-12-05 LAB — VH DEXTROSE STICK GLUCOSE
Glucose POCT: 166 mg/dL — ABNORMAL HIGH (ref 71–99)
Glucose POCT: 133 mg/dL — ABNORMAL HIGH (ref 71–99)
Glucose POCT: 180 mg/dL — ABNORMAL HIGH (ref 71–99)
Glucose POCT: 149 mg/dL — ABNORMAL HIGH (ref 71–99)
Glucose POCT: 218 mg/dL — ABNORMAL HIGH (ref 71–99)

## 2016-12-05 MED ORDER — PIPERACILLIN-TAZOBACTAM IN DEX 3-0.375 GM/50ML IV SOLN
3.3750 g | Freq: Four times a day (QID) | INTRAVENOUS | Status: DC
Start: 2016-12-05 — End: 2016-12-08
  Administered 2016-12-05 – 2016-12-08 (×13): 3.375 g via INTRAVENOUS
  Filled 2016-12-05 (×13): qty 50

## 2016-12-05 MED ORDER — TAMSULOSIN HCL 0.4 MG PO CAPS
0.4000 mg | ORAL_CAPSULE | Freq: Every day | ORAL | Status: DC
Start: 2016-12-05 — End: 2016-12-09
  Administered 2016-12-05 – 2016-12-08 (×4): 0.4 mg via ORAL
  Filled 2016-12-05 (×5): qty 1

## 2016-12-05 MED ORDER — HYDROCODONE-ACETAMINOPHEN 5-325 MG PO TABS
2.0000 | ORAL_TABLET | ORAL | Status: DC | PRN
Start: 2016-12-05 — End: 2016-12-09
  Administered 2016-12-05: 1 via ORAL
  Administered 2016-12-05 – 2016-12-09 (×19): 2 via ORAL
  Filled 2016-12-05 (×20): qty 2

## 2016-12-05 NOTE — Progress Notes (Signed)
[place] DAILY PROGRESS NOTE      [name]  Attending Physician: [name], MD    [name] REVIEWED INFORMATION BELOW, DISCUSSED RELEVANT POINTS WITH PATIENTS AND PERSONALLY DEVELOPED PLAN OF CARE.    Assessment/Plan:   POD #  13  S/p hybrid afib ablation   Doing well  Leukocytosis : improving trending down todays results pending  ARF : Cr improved trending to baseline : Nephrology following  Pulm: HAP : Abx ongoing         Dispo: Overall doing better today and improved continue current care plan reporting difficult to generate stream for micturition. Will start Flomax     Patient seen with Dr. Charise Carwin who personally evaluated and collaborated with me in all aspects of this patients care    Subjective          HPI/Subjective: 66 y.o. year old male with above stated PMH resting quietly in bed    Vital Signs:   Vitals:  Temp: (!) 96.8 F (36 C)  Heart Rate: 95  BP: (!) 143/91  SpO2: 98 %  O2 Flow Rate (L/min): 2 L/min    Pre-Op Weight: 85.3 kg (188 lb) (11/15/16 1033)  Post-Op Weight: 78 kg (171 lb 15.3 oz) (12/05/16 0430)    Recent Labs  Lab 12/05/16  0733 12/05/16  0252 12/04/16  2106 12/04/16  1629 12/04/16  1307   Glucose, POCT 133* 166* 179* 255* 247*       No intake or output data in the 24 hours ending 12/05/16 1143    Physical Exam:     General: awake, alert, no acute distress, pleasant, comfortable  Cardiovascular: Irreg S1-2 no S 3-4   Lungs:Decreased bases Bilat  Abdomen:  normoactive bowel sounds, soft, non-tender, non-distended  Extremities: no clubbing, cyanosis, or edema.  Neuro: Cranial nerves II-XII are grossly intact.  Motor and sensory function of the upper and lower extremities are grossly intact.    Meds:     Current Facility-Administered Medications   Medication Dose Route Frequency   . amiodarone  200 mg Oral BID   . apixaban  5 mg Oral Q12H [place]   . divalproex EC/DR tablet  500 mg Oral Q12H [place]   . docusate sodium  100 mg Oral Daily   .  furosemide  40 mg Intravenous BID   . insulin glargine  10 Units Subcutaneous Q12H SCH   . labetalol  200 mg Oral Q12H SCH   . lactobacillus species  50 Billion CFU Oral Daily   . pantoprazole  40 mg Oral BID AC   . piperacillin-tazobactam  3.375 g Intravenous Q6H   . polyethylene glycol  17 g Oral Daily   . potassium chloride  40 mEq Oral Daily   . pravastatin  40 mg Oral QPM   . senna  8.6 mg Oral QHS   . vancomycin  1,000 mg Intravenous Q12H   . vancomycin therapy placeholder   Does not apply See Admin Instructions     Current Facility-Administered Medications   Medication Dose Route Frequency Last Rate     Current Facility-Administered Medications   Medication Dose Route   . albuterol  2.5 mg Nebulization   . belladonna-opium  30 mg Rectal   . dextrose  125 mL Intravenous   . glucagon (rDNA)  1 mg Intramuscular   . HYDROcodone-acetaminophen  1 tablet Oral   . insulin aspart  2-24 Units Subcutaneous   . ondansetron  4 mg Oral  Or   . ondansetron  4 mg Intravenous   . promethazine  25 mg Oral    Or   . promethazine  12.5 mg Rectal    Or   . promethazine  6.25 mg Intramuscular   . traMADol  50 mg Oral             Labs:       Recent Labs  Lab 12/05/16  0316 12/04/16  0547 12/03/16  1109 12/03/16  0534 12/02/16  0559  11/30/16  0824   Sodium 137 135* 136 136 134* More results in Results Review  --    Potassium 4.0 4.0 3.9 4.0 3.8 More results in Results Review  --    Chloride 95* 94* 93* 95* 96* More results in Results Review  --    CO2 37.0* 32.4* 33.1* 32.2* 30.2* More results in Results Review  --    BUN 20 17 17 16 20  More results in Results Review  --    Creatinine 0.91 1.22 1.19 1.17 1.18 More results in Results Review  --    EGFR 87 61 63 64 63 More results in Results Review  --    Calcium 8.9 8.9 9.2 8.8 8.3* More results in Results Review  --    Magnesium  --   --   --   --   --   --  2.2   More results in Results Review = values in this interval not displayed.    Recent Labs  Lab 12/04/16  1620  12/04/16  0547 12/03/16  1109   WBC 23.0* 25.3* 27.4*   RBC 3.06* 2.95* 3.16*   Hemoglobin 9.1* 8.6* 9.2*   Hematocrit 28.4* 27.6* 29.6*   MCV 93 93 94   PLT CT 314 288 229       Recent Labs  Lab 12/02/16  1407 11/29/16  1937   PT 14.0* 13.0*   PT INR 1.4* 1.3           Recent Labs  Lab 12/05/16  0316  12/02/16  1407   Bilirubin, Total  --   --  1.1   Bilirubin, Direct  --   --  0.6*   Protein, Total  --   --  5.7*   Albumin 2.4* More results in Results Review 2.5*   ALT  --   --  190*   AST (SGOT)  --   --  18   More results in Results Review = values in this interval not displayed.    Radiology:     Radiology Results (24 Hour)     Procedure Component Value Units Date/Time    XR Chest AP Portable [[id number]] Collected:  12/05/16 0847    Order Status:  Completed Updated:  12/05/16 0851    Narrative:       Clinical History:  [place] chest tube. LEFT pleural effusion. Post op hybrid ablation and pericardiocentesis.  History of left pleural effusion.      Examination:  Frontal view of the chest.    Comparison:  December 04, 2016.    Findings:  Atheromatous aortic knob.  Prominent aortic knob, as before.  Uncoiled descending thoracic aorta.  Stable prominent cardiomegaly.  Persistent mild to moderate-sized left-sided pleural effusion.  No discernible pneumothorax.  Stable-appearing appearing left retrocardiac air bronchogram.  Diffusely increased lung markings bilaterally, as before, nonspecific.  Stable prominence right and left hilar silhouettes.        Impression:  Findings again similar to prior.  Stable prominent cardiomegaly.  Left-sided pleural effusion.  Left retrocardiac air bronchogram.  Possible mild CHF/fluid overload.  Stable ectatic/aneurysmal aortic knob.    ReadingStation:[email or web address]          Estella Husk, [place]  Date: December 05, 2016  Time: 11:43 AM    Please feel free to contact me or [name] for any further questions or concerns.    [facility]  [phone]

## 2016-12-05 NOTE — Progress Notes (Signed)
Pt reports pain improved. Pt ambulated in hallway. Pt sitting up in bed now, no distress noted.

## 2016-12-05 NOTE — Progress Notes (Signed)
Report received and care assumed of pt. Pt moaning, c/o pain. Pt c/o bladder spasms, refusing belladonna-opium suppository, states it does not help. Pt asking for pain pill for back. CBIR, will continue to monitor.

## 2016-12-05 NOTE — Plan of Care (Signed)
Problem: Pain interferes with ability to perform ADL  Goal: Pain at adequate level as identified by patient  Outcome: Progressing   12/05/16 1955   Goal/Interventions addressed this shift   Pain at adequate level as identified by patient Identify patient comfort function goal;Assess for risk of opioid induced respiratory depression, including snoring/sleep apnea. Alert healthcare team of risk factors identified.;Assess pain on admission, during daily assessment and/or before any "as needed" intervention(s);Reassess pain within 30-60 minutes of any procedure/intervention, per Pain Assessment, Intervention, Reassessment (AIR) Cycle;Evaluate if patient comfort function goal is met;Offer non-pharmacological pain management interventions;Evaluate patient's satisfaction with pain management progress;Include patient/patient care companion in decisions related to pain management as needed;Consult/collaborate with Physical Therapy, Occupational Therapy, and/or Speech Therapy

## 2016-12-05 NOTE — Progress Notes (Signed)
Per the [facility] Automatic Renal Dosing Protocol:    The following medication was adjusted based on the patient's creatinine clearance:    Medication: Zosyn  Indication: HAP/Sepsis  SCr: 0.91 mg/dL  CrCL:74 ml/min  Previous Dosing Regimen: Zosyn 3.375 g IV every 8 hours  New Dosing Regimen: Zosyn 3.375 g IV every 6 hours      Tiffany Kocher, PharmD.  Pharmacist    [facility]  Pharmacy Department

## 2016-12-05 NOTE — Progress Notes (Signed)
Pharmacy Vancomycin Dosing Consult Note  [name] [facility]    Assessment:   1. Day # 4 Vancomycin (also on pip/tazo) for HAP/positive sepsis screening for 40 yoM.  POD #13 s/p hybrid left ablation.  CXR persistent left effusion and basilar airspace disease.    2. Creatinine improved and is stable today, persistent leukocytosis steadily improving, afebrile.  Blood and peritoneal cultures in no growth to date. C.diff negative. Trough today was drawn appropriately.     Plan:   1. Continue Vancomycin 1000 mg IV every 12 hours  2. Adjust non-extended infusion pip/tazo dose for renal function  3. Pharmacy will follow the patient's renal function, vancomycin levels, and dosing during the course of therapy. If you have any questions, please contact the pharmacist at [phone].     Indication: HAP/positive sepsis screening.    Age: 66 y.o.  Height: 1.702 m (5\' 7" )  Weight:  78 kg (171 lb 15.3 oz)  IBW: 66 kg  DW: N/A    CrCL:  63 ml/min    Other Nephrotoxic Drugs: furosemide, protonix, Zosyn, pravastatin    Drug Levels:   Trough level: 4/28 at 1041 = 17.23 mcg/ml (on vanco 1000 mg IV every 12 hours)     Pharmacokinetics:   t50: 12 hrs         Kel: 0.05669 hr-1        Vd: 60 L         Expected Trough: 17 mg/L                Recent Labs  Lab 12/05/16  0316 12/04/16  0547 12/03/16  1109 12/03/16  0534   Creatinine 0.91 1.22 1.19 1.17   BUN 20 17 17 16        Recent Labs  Lab 12/04/16  1620 12/04/16  0547 12/03/16  1109 12/02/16  0923   WBC 23.0* 25.3* 27.4* 28.0*     Temp (24hrs), Avg:97.6 F (36.4 C), Min:96.6 F (35.9 C), Max:98.8 F (37.1 C)      Cultures:   Date Source Organism Sensitivities Resistance   4/26 Blood x 2 In progress

## 2016-12-05 NOTE — Progress Notes (Signed)
Social visit:    No significant change from yesterday.  Hemodynamically stable.  His heart rate is atrial fibrillation average 90's although he does have a few episodes where by his heart rate goes into rapid atrial fibrillation at a heart rate as high as 120.  The only recommendation that I would suggest just perhaps discontinuation of the labetalol and switching him over to a long-acting Toprol-XL 25-50 milligrams once a day.  He is already on amiodarone which I would continue.  The rest of his medical therapy appears to be quite appropriate.    Thank you  Marinus Maw

## 2016-12-06 ENCOUNTER — Inpatient Hospital Stay: Payer: Medicare Other

## 2016-12-06 LAB — COMPREHENSIVE METABOLIC PANEL
ALT: 69 U/L — ABNORMAL HIGH (ref 0–55)
AST (SGOT): 22 U/L (ref 10–42)
Albumin/Globulin Ratio: 0.73 Ratio (ref 0.70–1.50)
Albumin: 2.4 gm/dL — ABNORMAL LOW (ref 3.5–5.0)
Alkaline Phosphatase: 120 U/L (ref 40–145)
Anion Gap: 10.8 mMol/L (ref 7.0–18.0)
BUN / Creatinine Ratio: 16.2 Ratio (ref 10.0–30.0)
BUN: 17 mg/dL (ref 7–22)
Bilirubin, Total: 0.8 mg/dL (ref 0.1–1.2)
CO2: 32.4 mMol/L — ABNORMAL HIGH (ref 20.0–30.0)
Calcium: 8.8 mg/dL (ref 8.5–10.5)
Chloride: 97 mMol/L — ABNORMAL LOW (ref 98–110)
Creatinine: 1.05 mg/dL (ref 0.80–1.30)
EGFR: 73 mL/min/{1.73_m2} (ref 60–150)
Globulin: 3.3 gm/dL (ref 2.0–4.0)
Glucose: 126 mg/dL — ABNORMAL HIGH (ref 71–99)
Osmolality Calc: 275 mOsm/kg (ref 275–300)
Potassium: 4.2 mMol/L (ref 3.5–5.3)
Protein, Total: 5.7 gm/dL — ABNORMAL LOW (ref 6.0–8.3)
Sodium: 136 mMol/L (ref 136–147)

## 2016-12-06 LAB — CBC AND DIFFERENTIAL
Bands: 1 % (ref 0–10)
Basophils %: 0 % (ref 0.0–3.0)
Basophils Absolute: 0 10*3/uL (ref 0.0–0.3)
Eosinophils %: 1 % (ref 0.0–7.0)
Eosinophils Absolute: 0.2 10*3/uL (ref 0.0–0.8)
Hematocrit: 27.2 % — ABNORMAL LOW (ref 39.0–52.5)
Hemoglobin: 8.3 gm/dL — ABNORMAL LOW (ref 13.0–17.5)
Lymphocytes Absolute: 1.1 10*3/uL (ref 0.6–5.1)
Lymphocytes: 6 % — ABNORMAL LOW (ref 15.0–46.0)
MCH: 29 pg (ref 28–35)
MCHC: 31 gm/dL — ABNORMAL LOW (ref 32–36)
MCV: 94 fL (ref 80–100)
MPV: 6.2 fL (ref 6.0–10.0)
Monocytes Absolute: 1.1 10*3/uL (ref 0.1–1.7)
Monocytes: 6 % (ref 3.0–15.0)
Neutrophils %: 86 % — ABNORMAL HIGH (ref 42.0–78.0)
Neutrophils Absolute: 15.4 10*3/uL — ABNORMAL HIGH (ref 1.7–8.6)
PLT CT: 352 10*3/uL (ref 130–440)
RBC: 2.9 10*6/uL — ABNORMAL LOW (ref 4.00–5.70)
RDW: 17 % — ABNORMAL HIGH (ref 11.0–14.0)
WBC: 17.7 10*3/uL — ABNORMAL HIGH (ref 4.0–11.0)

## 2016-12-06 LAB — RENAL FUNCTION PANEL
Albumin: 2.4 gm/dL — ABNORMAL LOW (ref 3.5–5.0)
Anion Gap: 10.8 mMol/L (ref 7.0–18.0)
BUN / Creatinine Ratio: 16.2 Ratio (ref 10.0–30.0)
BUN: 17 mg/dL (ref 7–22)
CO2: 32.4 mMol/L — ABNORMAL HIGH (ref 20.0–30.0)
Calcium: 8.8 mg/dL (ref 8.5–10.5)
Chloride: 97 mMol/L — ABNORMAL LOW (ref 98–110)
Creatinine: 1.05 mg/dL (ref 0.80–1.30)
EGFR: 73 mL/min/{1.73_m2} (ref 60–150)
Glucose: 126 mg/dL — ABNORMAL HIGH (ref 71–99)
Osmolality Calc: 275 mOsm/kg (ref 275–300)
Phosphorus: 3.2 mg/dL (ref 2.3–4.7)
Potassium: 4.2 mMol/L (ref 3.5–5.3)
Sodium: 136 mMol/L (ref 136–147)

## 2016-12-06 LAB — VH DEXTROSE STICK GLUCOSE
Glucose POCT: 156 mg/dL — ABNORMAL HIGH (ref 71–99)
Glucose POCT: 102 mg/dL — ABNORMAL HIGH (ref 71–99)
Glucose POCT: 170 mg/dL — ABNORMAL HIGH (ref 71–99)
Glucose POCT: 186 mg/dL — ABNORMAL HIGH (ref 71–99)
Glucose POCT: 207 mg/dL — ABNORMAL HIGH (ref 71–99)

## 2016-12-06 NOTE — OT Progress Note (Signed)
VHS: [facility]  Department of Rehabilitation Services: [phone]    Eman Morimoto    CSN#: [id number]  SURG TELEMETRY STEP-DOWN 330/330-A    Occupational Therapy Progress Note    Patient's medical condition is appropriate for Occupational therapy intervention at this time.    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 Patient able to perform one grooming task standing at the sink but limited due to bladder spasm and needed to sit. Would benefit from continued skilled occupational therapy services for below noted impairments.    Patient continues to have the following impairments: decreased independence with ADLs, decreased independence with IADLs     Goals:   In 2to 3visits:  1. Patient will donn/doff socks with Moderate assistand use of AE/AT prn. ONGOING    3. Patient will stand and arrange pants/undergarments over hips with Moderate assistand use of AE/AT/AD prn. MET 4/30    5. Patient will perform BSC/chair transfer with Supervisionand use of AD/DME prn. ONGOING    9. Patient will perform 2grooming tasks with Minimal assistwhile standingand use of AE/AT prn. ONGOING    LTG (by d/c):  1. Patient will be  Supervision Minimal assistfor ADLs, Supervisionfor functional transfers/mobility with use of AD/DME/AT/AE. 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 Nursing clears patient for therapy.     Pain:  Patient did not verbalize pain at rest but did c/o bladder spasms. Patient's face would turn red and he required VCs to continue to breath. Did not formally rate pain with a number  Location: Bladder  Interventions: Relaxation breathing    Objective:   Observation of Patient:    Patient is seated in a bedside chair with telemetry in place.      Vital Signs:   Stable with no signs/symptoms of distress     Oriented to: Oriented x4  Command following: Follows 1 step commands without difficulty  Alertness/Arousal: Appropriate responses to stimuli   Attention Span:Appears intact  Memory: Appears intact  Safety Awareness: minimal verbal instruction  Insights: Educated in safety awareness  Problem Solving: Assistance required to generate solutions, Assistance required to implement solutions  Behavior: [place] Sitting Balance Scale  4/5    Patient moves and returns trunkal midpoint 1-2 inches in one plane.  [employer] Standing Balance Scale  2+/5  Patient supports self independently with 1 upper extremity. (i.e. cane, 1 crutch).    Activities of Daily Living:   GROOMING: Supervision/Set up;  teeth care performed standing at sink, standing with  Front wheeled walker with no assistive device   LB Dressing: Minimal assist; thread RLE into underwear, thread LLE into underwear, pull up over hips performed  seated in a chair, standing with   Front wheeled walker  TOILETING:  Total assist for perineal hygiene, change adult brief; standing      Functional Mobility:   Bed Mobility:  Not tested due to patient already OOB.    Transfers:  Sit to Stand:  Supervision with [place] wheeled walker.   Cues for Sequencing, Cues for Hand Placement  Stand to Sit:  Minimal assist.   Cues for Sequencing, Cues for Hand Placement  Chair: Minimal assist with Front wheeled walker.   Cues for Sequencing, Cues for Hand Placement  Functional mobility/ambulation: Minimal assist with Front wheeled walker.      Participation and Activity Tolerance   Participation effort: Good  Endurance: Limited by pain    Other Treatment Interventions:   Treatment Activities:   Not applicable          Education Provided:   Topics: Role of occupational therapy, plan of care, goals of therapy and safety with mobility and ADLs, benefits of activity, discharge instructions, home safety.    Individuals educated: Patient.  Method: Explanation.  Response to education: Verbalized understanding.    Team Communication:   OT communicated with: RN/LPN   OT communicated regarding: Patient position at end of session, Patient participation with Therapy  OT/COTA communication: via written note and verbal communication as needed.    Ambulating, Staff present: PT and No distress    Recommend patient to be OOB for meals & toileting needs outside of and in addition to OT session.    Freddrick March, MS, OTR/L

## 2016-12-06 NOTE — Progress Notes (Signed)
3419 Pt assessed, resting in bed.  Pt states that he is having more bladder spasms this morning.  2 Norcos given per request.  Pt states that he does not want the B&O suppository.  He states, "it doesn't help".  Will monitor.

## 2016-12-06 NOTE — Progress Notes (Signed)
Cardiology Progress Note      Date Time: 12/06/16 1:20 PM  [name], [name]      Assessment:     Active Hospital Problems    Diagnosis   . Chronic anticoagulation   . Stroke   . CKD (chronic kidney disease)   . Former smoker, stopped smoking many years ago   . Type 2 diabetes mellitus   . HTN (hypertension)   . HLD (hyperlipidemia)   . Diabetic neuropathy   . COPD (chronic obstructive pulmonary disease)   . Bipolar disorder   . BPH (benign prostatic hyperplasia)   . Atrial fibrillation   . Chronic atrial fibrillation   . Stage 4 chronic kidney disease             Plan:     1. Much more awake and alert. WBC is decreasing on broad spectrum antibiotics. Will plan on TEE guided cardioversion tomorrow.       Subjective:   Feels better, neurologic function is improving.     Physical Exam:     Temp:  [95.2 F (35.1 C)-98.2 F (36.8 C)] 98.1 F (36.7 C)  Heart Rate:  [78-99] 95  Resp Rate:  [18-24] 20  BP: (140-160)/(82-108) 152/95    Wt Readings from Last 3 Encounters:   12/06/16 77.7 kg (171 lb 4.8 oz)   10/21/16 85.5 kg (188 lb 9.6 oz)   10/07/16 84 kg (185 lb 3.2 oz)            Intake/Output Summary (Last 24 hours) at 12/06/16 1320  Last data filed at 12/06/16 0900   Gross per 24 hour   Intake              810 ml   Output                0 ml   Net              810 ml       General appearance - alert, well appearing, and in no distress  Chest -clear to auscultation, no wheezes, rales or rhonchi, symmetric air entry  Heart - irregularly irregular rhythm  Extremities - peripheral pulses normal, no pedal edema, no clubbing or cyanosis, trace pedal edema  Abd: soft. Non-tender, non-distended,  Neuro: alert and oriented  Skin: no rashes  Psych: normal affect    Medications:     Scheduled Meds:   amiodarone 200 mg Oral BID   apixaban 5 mg Oral Q12H Irvona   divalproex EC/DR tablet 500 mg Oral Q12H SCH   docusate sodium 100 mg Oral Daily   furosemide 40 mg Intravenous BID   insulin glargine 10 Units  Subcutaneous Q12H SCH   labetalol 200 mg Oral Q12H SCH   lactobacillus species 50 Billion CFU Oral Daily   pantoprazole 40 mg Oral BID AC   piperacillin-tazobactam 3.375 g Intravenous Q6H   polyethylene glycol 17 g Oral Daily   potassium chloride 40 mEq Oral Daily   pravastatin 40 mg Oral QPM   senna 8.6 mg Oral QHS   tamsulosin 0.4 mg Oral QD after dinner   vancomycin 1,000 mg Intravenous Q12H   vancomycin therapy placeholder  Does not apply See Admin Instructions             Labs:       Recent Labs  Lab 12/06/16  0341 12/05/16  0316 12/04/16  0547  11/30/16  0824   Glucose 126*  126* 158* 123*  More results in Results Review  --    BUN 17  17 20 17  More results in Results Review  --    Creatinine 1.05  1.05 0.91 1.22 More results in Results Review  --    Sodium 136  136 137 135* More results in Results Review  --    Potassium 4.2  4.2 4.0 4.0 More results in Results Review  --    Chloride 97*  97* 95* 94* More results in Results Review  --    CO2 32.4*  32.4* 37.0* 32.4* More results in Results Review  --    Magnesium  --   --   --   --  2.2   AST (SGOT) 22  --   --  More results in Results Review  --    ALT 69*  --   --  More results in Results Review  --    More results in Results Review = values in this interval not displayed.  Estimated Creatinine Clearance: 63.8 mL/min (based on SCr of 1.05 mg/dL).      Recent Labs  Lab 11/26/16  0302   CHOL 70*   TRIG 78   HDL 21*   LDL 33         Recent Labs  Lab 12/06/16  0341 12/04/16  1620 12/04/16  0547  12/02/16  1407  11/29/16  1937   WBC 17.7* 23.0* 25.3* More results in Results Review  --  More results in Results Review 13.1*   Hemoglobin 8.3* 9.1* 8.6* More results in Results Review  --  More results in Results Review 8.7*   Hematocrit 27.2* 28.4* 27.6* More results in Results Review  --  More results in Results Review 27.7*   PLT CT 352 314 288 More results in Results Review  --  More results in Results Review 210   PT INR  --   --   --   --  1.4*  --  1.3    More results in Results Review = values in this interval not displayed.    No results for input(s): TROPI, CK, CKMB in the last 8760 hours.      Recent Labs  Lab 11/26/16  0302   TSH 1.81       Radiology: all results from this admission  Xr Chest 2 Views    Result Date: 11/15/2016  Cardiomegaly with cephalization of pulmonary vascular flow. No focal infiltrate, pleural effusion, or pneumothorax. ReadingStation:[email or web address]    Xr Abdomen Ap    Result Date: 11/24/2016  Findings worrisome for partial proximal or mid small bowel obstruction. ReadingStation:[email or web address]    Ct Head Wo Contrast    Result Date: 11/24/2016  1. No evidence of intracranial hemorrhage. 2. Probable subacute infarct superior LEFT frontoparietal lobe gyrus. 3. If clinically indicated, MRI may be helpful in further evaluation. ReadingStation:[email or web address]    Ct Chest Wo Contrast    Result Date: 11/28/2016  1.  DESCENDING THORACIC AORTIC DISSECTION. 2.  LARGE LEFT EFFUSION WITH COMPLETE LEFT LOWER LOBE COLLAPSE. 3.  CARDIOMEGALY WITH PERICARDIAL FLUID. 4.  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 ReadingStation:[email or web address]    Xr Chest Ap Only    Result Date: 11/22/2016  No pneumothorax or significant acute pulmonary abnormalities. Small amount of subcutaneous emphysema in the anterior chest. ReadingStation:[email or web address]    Xr Chest Ap Portable    Result Date: 12/06/2016  FINDINGS/IMPRESSION: Persistent left lower lobe consolidation with underlying effusion, which appears partially loculated. No change  from prior. ReadingStation:[email or web address]    Xr Chest Ap Portable    Result Date: 12/05/2016  Findings again similar to prior. Stable prominent cardiomegaly. Left-sided pleural effusion. Left retrocardiac air bronchogram. Possible mild CHF/fluid overload. Stable ectatic/aneurysmal aortic knob. ReadingStation:[email or web address]    Xr Chest Ap Portable    Result Date: 12/04/2016  Findings similar to prior. ReadingStation:[email or web address]    Xr Chest Ap Portable    Result  Date: 12/03/2016  No significant change ReadingStation:[email or web address]    Xr Chest Ap Portable    Result Date: 12/02/2016  1.  NO ACUTE CARDIOPULMONARY CHANGE. 2.  NO PNEUMOTHORAX. 3.  LEFT EFFUSION AND ASSOCIATED BASILAR AIRSPACE DISEASE. ReadingStation:[email or web address]    Xr Chest Ap Portable    Result Date: 12/01/2016  1.  NO CHANGE. 2.  LEFT CHEST TUBE WITHOUT PNEUMOTHORAX. 3.  LEFT EFFUSION WITH ASSOCIATED BASILAR AIRSPACE DISEASE. ReadingStation:[email or web address]    Xr Chest Ap Portable    Result Date: 11/30/2016  Stable small left pleural effusion with left basilar atelectasis or infiltrate. ReadingStation:[id number]    Xr Chest Ap Portable    Result Date: 11/29/2016  1.  Left apical chest tube in place without appreciable pneumothorax. 2.  Hazy opacification of the left base likely represents a combination of atelectasis and a small amount of pleural fluid. 3.  Unchanged cardiomegaly. ReadingStation:[email or web address]    Xr Chest Ap Portable    Result Date: 11/28/2016  1.  NO PNEUMOTHORAX AFTER CHEST TUBE PLACEMENT. 2.  LEFT EFFUSION NEARLY COMPLETELY RESOLVED. ReadingStation:[email or web address]    Xr Chest Ap Portable    Result Date: 11/28/2016  Increase left effusion and worsening airspace disease left lower lung. ReadingStation:[email or web address]    Xr Chest Ap Portable    Result Date: 11/26/2016  Stable pulmonary vascular congestion and left basilar atelectasis or infiltrate. Probable small left pleural effusion is unchanged. ReadingStation:[id number]    Xr Chest Ap Portable    Result Date: 11/24/2016  Cardiomegaly with bilateral airspace disease or edema with small left effusion. No observed pneumothorax. Interval removal right IJ central line. ReadingStation:[email or web address]    X-ray Chest Ap Portable    Result Date: 11/23/2016  1. Pulmonary vascular congestion without overt failure 2. Left basilar opacity likely combination of atelectasis, consolidation and small effusion. 3. Catheter appliances as above ReadingStation:[email or web address]    Xr Abdomen Portable    Result Date:  11/25/2016  No acute abnormality. [place], DO

## 2016-12-06 NOTE — PT Progress Note (Signed)
PT Progress Note  VHS: Department of [facility]: [phone]    [name]    CSN: [id number]    SURG TELEMETRY STEP-DOWN   330/330-A    Physical Therapy Treatment Note    Time of treatment:   Time Calculation  PT Received On: 12/06/16  Start Time: 1512  Stop Time: 1528  Time Calculation (min): 16 min    Visit#: 4    Last seen by Physical therapist vs. PTA: 12/06/2016    Precautions and Contraindications:  Falls  Mobility protocol     Medical Diagnosis/Pertinent medical/surgical details: hybrid convergent atrial ablation procedure on 11/22/16. On 11/25/16, the patient was transferred to the ICU with shortness of breath. The patient had a large pericardial effusion drained on 11/25/16. The patient is also presenting with right sided weakness, particularly on the right upper extremity. 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    Goals:   To be completed by discharge:  1. The patient will complete all bed mobility with supervision in order to prepare to transfer. ONGOING  2. The patient will complete sit to stand and stand to sit transfers with supervision in order to prepare to ambulate. MET  3. The patient will ambulate 150 feet with supervision with LRAD in order to demonstrate independence with mobility. ONGOING  4. The patient will ascend/descend 7 stairs with 2 rails with supervision in order to demonstrate the ability to get into his home. ONGOING    ASSESSMENT:    Patient's progress towards established goals: Patient with good progress towards ambulation goal, ambulated 100 feet with front wheel walker and supervision today.     Patient will continue to benefit from skilled PT services in order to maximize safety and independence with functional mobility as patient continues to have the following impairments: decreased activity tolerance, decreased functional mobility, gait deficits    Recommend patient walk in  hall with front wheeled walker and supervision twice a day and sit in chair for all meals outside of PT sessions.    PLAN:       Treatment/interventions: Exercise, Gait training, Stair training, Neuromuscular re-education, Functional transfer training, Patient/caregiver training, Bed mobility    Treatment Frequency: 4-5x/wk    DISCHARGE RECOMMENDATIONS   DME recommended for Discharge:   TBD at next level of care    Discharge Recommendations:   Other (Comment) (in-patient rehab)   Pending progress in next therapy session    SUBJECTIVE:  Patient complains of bladder spasm due to being taken off of Flomax, with pain so severe that patient has to sit down.     Pain:  At Rest: 0/10  With Activity: Patient did not rate pain, but per FACES scale pain was 9-10 during bladder spasm  Location: Bladder  Interventions: Rest    OBJECTIVE:    Observation of Patient/Vital Signs:   Patient is standing at sink with OT present .  Patient's medical condition is appropriate for Physical therapy intervention at this time.      Vital Signs:  Stable with no signs/symptoms of distress    Cognition:    Oriented to: Oriented x4  Command following: Follows 1 step commands without difficulty  Alertness/Arousal: Appropriate responses to stimuli   Attention Span:Appears intact  Memory: Appears intact  Safety Awareness: minimal verbal instruction    Musculoskeletal and balance details:    Patient with no gross loss of balance while standing or  ambulating with front wheel walker          Bed Mobility:   Sit to supine: Modified independent  Supine scooting: Independent    Transfers:  Sit to stand: Supervision with front wheel walker with cues for hand placement  Stand to sit: Supervision with cueing for hand placement    Locomotion:  Patient ambulated 100 feet with front wheel walker and supervision with chair follow for safety due to potential for patient to have bladder spasm. Patient ambulated with moderately decreased gait speed, using  reciprocal pattern. Patient managed turns with walker with no loss of balance.  Gait training: Instructed patient in getting walker around obstacles, and that his gait may be affected by his feeling of slight weakness on the left side, as patient states he felt the walker was pushing a 0 home on side than the other.    Participation effort:  Good  Activity Tolerance: Patient tolerated 20-30 minutes of activity with rest breaks    Treatment interventions this session:    Transfer training and gait training as above     Education Provided  TOPICS: role of physical therapy, plan of care, goals of therapy and safety with mobility and ADLs, benefits of activity    Learner educated: Patient  Method: Explanation  Response to education: Verbalized understanding      Team Communication:  Spoke to : RN/LPN Olivia Mackie, [place] regarding: Patient participation with Therapy  Whiteboard updated: Yes  PT/PTA communication: via written note and verbal communication as needed.    Patient position at end of session:  Supine, bed alarm set, patient in no distress and needs in reach       Macky Lower, PT    This note was completed using dragon medical speech recognition software. Grammatical errors, random word insertions, pronoun errors, incorrect word insertion, misspellings  and incomplete sentences are occasional consequences of this technology due to software limitations. If there are questions or concerns about the content of this note or information contained within the body of this dictation they should be addressed with the provider for clarification.

## 2016-12-06 NOTE — Plan of Care (Signed)
Problem: Moderate/High Fall Risk Score >5  Goal: Patient will remain free of falls  Outcome: Progressing

## 2016-12-06 NOTE — Progress Notes (Signed)
Nutrition Therapy  Nutrition Follow Up    Patient Information:     Name:[name]   Age: 66 y.o.   Sex: male     MRN: [id number]      Recommendation:     1. Continue with Consistent Carb diet    Nutrition Assessment:     Tolerating Consistent Carb diet with 75 - 100% intake.  + normal bowel function    Nutrition Risk Level: Low      Nutrition Diagnosis:     None at this time      Assessment Data:     Height: 1.702 m (5\' 7" )   Weight: 77.7 kg (171 lb 4.8 oz)   BMI: Body mass index is 26.83 kg/m.   IBW: 67 kg    Pertinent Meds: includes lasix, protonix  Pertinent Labs:    Recent Labs  Lab 12/06/16  0341 12/05/16  0316 12/04/16  0547 12/03/16  1109 12/03/16  0534 12/02/16  0559  11/30/16  0824   Sodium 136  136 137 135* 136 136 134* More results in Results Review  --    Potassium 4.2  4.2 4.0 4.0 3.9 4.0 3.8 More results in Results Review  --    Chloride 97*  97* 95* 94* 93* 95* 96* More results in Results Review  --    CO2 32.4*  32.4* 37.0* 32.4* 33.1* 32.2* 30.2* More results in Results Review  --    BUN 17  17 20 17 17 16 20  More results in Results Review  --    Creatinine 1.05  1.05 0.91 1.22 1.19 1.17 1.18 More results in Results Review  --    Glucose 126*  126* 158* 123* 136* 116* 107* More results in Results Review  --    Calcium 8.8  8.8 8.9 8.9 9.2 8.8 8.3* More results in Results Review  --    Magnesium  --   --   --   --   --   --   --  2.2   Phosphorus 3.2 3.4 4.1  --  3.5 3.2 More results in Results Review  --    More results in Results Review = values in this interval not displayed.     Diet Order:  Orders Placed This Encounter   Procedures   . Diet consistent carbohydrate      I/O:  - 7.3 liters net fluid balance  Skin: no issues noted    Additional Comments:       Howie Ill, RD, CNSC  [phone]  12/06/2016 12:16 PM

## 2016-12-06 NOTE — Progress Notes (Signed)
12/06/16 1323   CM Review   CM Comments DCP 12/06/16:  DCP following clinical course for [place] needs.  Patient prefers [facility], but is willing to consider a private bed at [facility].  Per MD may be medically ready tomorrow.  [facility] as of now has a private bed to offer patient if [facility] does not have a bed available.  All facilities updated.  BARRIERS:  Medical stability            Marlowe Shores RN  Discharge Planner  [phone]

## 2016-12-06 NOTE — Progress Notes (Signed)
[place] DAILY PROGRESS NOTE      [name]  Attending Physician: [name], MD    [name] REVIEWED INFORMATION BELOW, DISCUSSED RELEVANT POINTS WITH PATIENTS AND PERSONALLY DEVELOPED PLAN OF CARE.    Assessment/Plan:   POD #  14  S/p hybrid afib ablation   Pt doing well  Leukocytosis: 17.7 today, improving (23 on 4/28)  ARF- creatinine 1.05  improved.  Nephrology following.   Pulm: HAP, empiric antibiotics, pt has remained afebrile. , CXR Persistent left lower lobe consolidation with underlying effusion, which appears partially loculated. No change from prior.    Bladder spasms: restarted on flomax yesterday  Right side weakness resolved.   Pt ambulating with PT.     Dispo: Pt continues to improve. Increase ambulation. Seems in better spirits today. Will plan for discharge tomorrow to SNF if WBC continues trending down.     Subjective   "I feel pretty good"  Still having bladder spasms but reports flomax usually helps, restarted on it last night.   Denies SOB, chest pain, palpitations.     Vital Signs:   Vitals:  Temp: 98.1 F (36.7 C)  Heart Rate: 95  BP: (!) 152/95  SpO2: 94 %  O2 Flow Rate (L/min): 2 L/min    Pre-Op Weight: 85.3 kg (188 lb) (11/15/16 1033)  Post-Op Weight: 77.7 kg (171 lb 4.8 oz) (12/06/16 0507)    Recent Labs  Lab 12/06/16  1202 12/06/16  0757 12/06/16  0340 12/05/16  2102 12/05/16  1649   Glucose, POCT 170* 102* 156* 218* 149*         Intake/Output Summary (Last 24 hours) at 12/06/16 1524  Last data filed at 12/06/16 1300   Gross per 24 hour   Intake              930 ml   Output                0 ml   Net              930 ml       Physical Exam:     General: awake, alert, no acute distress, pleasant, comfortable  Neck: supple  Cardiovascular:afib. normal S1,S2 no murmurs, rubs or gallops. Incision c/d/I   Lungs: decreased on left, no adventitious sounds. normal respiratory effort  Abdomen:  normoactive bowel sounds, soft, non-tender,  non-distended  Extremities: no clubbing, cyanosis, 1+ LE edema.  Neuro: Cranial nerves II-XII are grossly intact.  Motor and sensory function of the upper and lower extremities are grossly intact.     Meds:     Current Facility-Administered Medications   Medication Dose Route Frequency   . amiodarone  200 mg Oral BID   . apixaban  5 mg Oral Q12H [place]   . divalproex EC/DR tablet  500 mg Oral Q12H [place]   . docusate sodium  100 mg Oral Daily   . furosemide  40 mg Intravenous BID   . insulin glargine  10 Units Subcutaneous Q12H [facility]   . labetalol  200 mg Oral Q12H SCH   . lactobacillus species  50 Billion CFU Oral Daily   . pantoprazole  40 mg Oral BID AC   . piperacillin-tazobactam  3.375 g Intravenous Q6H   . polyethylene glycol  17 g Oral Daily   . potassium chloride  40 mEq Oral Daily   . pravastatin  40 mg Oral QPM   . senna  8.6 mg Oral QHS   .  tamsulosin  0.4 mg Oral QD after dinner   . vancomycin  1,000 mg Intravenous Q12H   . vancomycin therapy placeholder   Does not apply See Admin Instructions     Current Facility-Administered Medications   Medication Dose Route Frequency Last Rate     Current Facility-Administered Medications   Medication Dose Route   . albuterol  2.5 mg Nebulization   . belladonna-opium  30 mg Rectal   . dextrose  125 mL Intravenous   . glucagon (rDNA)  1 mg Intramuscular   . HYDROcodone-acetaminophen  2 tablet Oral   . insulin aspart  2-24 Units Subcutaneous   . ondansetron  4 mg Oral    Or   . ondansetron  4 mg Intravenous   . promethazine  25 mg Oral    Or   . promethazine  12.5 mg Rectal    Or   . promethazine  6.25 mg Intramuscular   . traMADol  50 mg Oral             Labs:       Recent Labs  Lab 12/06/16  0341 12/05/16  0316 12/04/16  0547 12/03/16  1109 12/03/16  0534  11/30/16  0824   Sodium 136  136 137 135* 136 136 More results in Results Review  --    Potassium 4.2  4.2 4.0 4.0 3.9 4.0 More results in Results Review  --    Chloride 97*  97* 95* 94* 93* 95* More results in Results  Review  --    CO2 32.4*  32.4* 37.0* 32.4* 33.1* 32.2* More results in Results Review  --    BUN 17  17 20 17 17 16  More results in Results Review  --    Creatinine 1.05  1.05 0.91 1.22 1.19 1.17 More results in Results Review  --    EGFR 73  73 87 61 63 64 More results in Results Review  --    Calcium 8.8  8.8 8.9 8.9 9.2 8.8 More results in Results Review  --    Magnesium  --   --   --   --   --   --  2.2   More results in Results Review = values in this interval not displayed.    Recent Labs  Lab 12/06/16  0341 12/04/16  1620 12/04/16  0547   WBC 17.7* 23.0* 25.3*   RBC 2.90* 3.06* 2.95*   Hemoglobin 8.3* 9.1* 8.6*   Hematocrit 27.2* 28.4* 27.6*   MCV 94 93 93   PLT CT 352 314 288       Recent Labs  Lab 12/02/16  1407 11/29/16  1937   PT 14.0* 13.0*   PT INR 1.4* 1.3           Recent Labs  Lab 12/06/16  0341  12/02/16  1407   Bilirubin, Total 0.8  --  1.1   Bilirubin, Direct  --   --  0.6*   Protein, Total 5.7*  --  5.7*   Albumin 2.4*  2.4* More results in Results Review 2.5*   ALT 69*  --  190*   AST (SGOT) 22  --  18   More results in Results Review = values in this interval not displayed.    Radiology:     Radiology Results (24 Hour)     Procedure Component Value Units Date/Time    XR Chest AP Portable [[id number]] Collected:  12/06/16 0743  Order Status:  Completed Updated:  12/06/16 0745    Narrative:       CLINICAL HISTORY:  Reason For Exam: [place] chest tube. left pleural effusion. post op hybrid ablation and pericardiocentesis   66 years  old Male     PROCEDURE: XR CHEST AP PORTABLE    COMPARISON: 4/29      Impression:       FINDINGS/IMPRESSION:  Persistent left lower lobe consolidation with underlying effusion, which appears partially loculated. No change from prior.      ReadingStation:[id number]          Leanor Kail, [place]  Date: December 06, 2016  Time: 3:24 PM    Please feel free to contact me or [name] for any further questions or concerns.    [facility]  [phone]

## 2016-12-06 NOTE — Progress Notes (Signed)
Pharmacy Vancomycin Dosing Consult Note  [name] [facility]    Assessment:   1. Day # 5 Vancomycin (also on pip/tazo) for HAP/positive sepsis screening for 66 yoM.  POD #13 s/p hybrid left ablation.  CXR persistent left effusion and basilar airspace disease.    2. Creatinine is stable, persistent leukocytosis steadily improving, afebrile.  Blood and peritoneal cultures in no growth to date. C.diff negative. Trough today was drawn appropriately.     Plan:   1. Continue Vancomycin 1000 mg IV every 12 hours  2. Next trough on or before 4/6.  3. Pharmacy will follow the patient's renal function, vancomycin levels, and dosing during the course of therapy. If you have any questions, please contact the pharmacist at [phone].     Indication: HAP/positive sepsis screening.    Age: 66 y.o.  Height: 1.702 m (5\' 7" )  Weight:  77.7 kg (171 lb 4.8 oz)  IBW: 66 kg  DW: N/A    CrCL:  63 ml/min    Other Nephrotoxic Drugs: furosemide, protonix, Zosyn, pravastatin    Drug Levels:   Trough level: 4/28 at 1041 = 17.23 mcg/ml (on vanco 1000 mg IV every 12 hours)     Pharmacokinetics:   t50: 12 hrs         Kel: 0.05669 hr-1        Vd: 60 L         Expected Trough: 17 mg/L                Recent Labs  Lab 12/06/16  0341 12/05/16  0316 12/04/16  0547 12/03/16  1109   Creatinine 1.05  1.05 0.91 1.22 1.19   BUN 17  17 20 17 17        Recent Labs  Lab 12/06/16  0341 12/04/16  1620 12/04/16  0547 12/03/16  1109   WBC 17.7* 23.0* 25.3* 27.4*     Temp (24hrs), Avg:97.1 F (36.2 C), Min:95.2 F (35.1 C), Max:98.2 F (36.8 C)      Cultures:   Date Source Organism Sensitivities Resistance   4/26 Blood x 2 ngtd

## 2016-12-06 NOTE — Plan of Care (Signed)
Problem: Impaired Mobility  Goal: Mobility/Activity is maintained at optimal level for patient  Outcome: Progressing   12/06/16 2052   Goal/Interventions addressed this shift   Mobility/activity is maintained at optimal level for patient Increase mobility as tolerated/progressive mobility;Encourage independent activity per ability;Maintain proper body alignment;Perform active/passive ROM;Plan activities to conserve energy, plan rest periods;Assess for changes in respiratory status, level of consciousness and/or development of fatigue;Consult/collaborate with Physical Therapy and/or Occupational Therapy;Reposition patient every 2 hours and as needed unless able to reposition self       Problem: Altered GI Function  Goal: Mobility/Activity is maintained at optimal level for patient  Outcome: Progressing   12/06/16 2052   Goal/Interventions addressed this shift   Mobility/activity is maintained at optimal level for patient Increase mobility as tolerated/progressive mobility;Encourage independent activity per ability;Maintain proper body alignment;Perform active/passive ROM;Plan activities to conserve energy, plan rest periods;Assess for changes in respiratory status, level of consciousness and/or development of fatigue;Consult/collaborate with Physical Therapy and/or Occupational Therapy;Reposition patient every 2 hours and as needed unless able to reposition self

## 2016-12-07 ENCOUNTER — Inpatient Hospital Stay: Payer: Medicare Other

## 2016-12-07 LAB — RENAL FUNCTION PANEL
Albumin: 2.5 gm/dL — ABNORMAL LOW (ref 3.5–5.0)
Anion Gap: 13.3 mMol/L (ref 7.0–18.0)
BUN / Creatinine Ratio: 14.2 Ratio (ref 10.0–30.0)
BUN: 17 mg/dL (ref 7–22)
CO2: 32 mMol/L — ABNORMAL HIGH (ref 20.0–30.0)
Calcium: 8.9 mg/dL (ref 8.5–10.5)
Chloride: 96 mMol/L — ABNORMAL LOW (ref 98–110)
Creatinine: 1.2 mg/dL (ref 0.80–1.30)
EGFR: 62 mL/min/{1.73_m2} (ref 60–150)
Glucose: 113 mg/dL — ABNORMAL HIGH (ref 71–99)
Osmolality Calc: 276 mOsm/kg (ref 275–300)
Phosphorus: 3.3 mg/dL (ref 2.3–4.7)
Potassium: 4.3 mMol/L (ref 3.5–5.3)
Sodium: 137 mMol/L (ref 136–147)

## 2016-12-07 LAB — COMPREHENSIVE METABOLIC PANEL
ALT: 64 U/L — ABNORMAL HIGH (ref 0–55)
AST (SGOT): 20 U/L (ref 10–42)
Albumin/Globulin Ratio: 0.66 Ratio — ABNORMAL LOW (ref 0.70–1.50)
Albumin: 2.5 gm/dL — ABNORMAL LOW (ref 3.5–5.0)
Alkaline Phosphatase: 119 U/L (ref 40–145)
Anion Gap: 13.3 mMol/L (ref 7.0–18.0)
BUN / Creatinine Ratio: 14.2 Ratio (ref 10.0–30.0)
BUN: 17 mg/dL (ref 7–22)
Bilirubin, Total: 0.8 mg/dL (ref 0.1–1.2)
CO2: 32 mMol/L — ABNORMAL HIGH (ref 20.0–30.0)
Calcium: 8.9 mg/dL (ref 8.5–10.5)
Chloride: 96 mMol/L — ABNORMAL LOW (ref 98–110)
Creatinine: 1.2 mg/dL (ref 0.80–1.30)
EGFR: 62 mL/min/{1.73_m2} (ref 60–150)
Globulin: 3.8 gm/dL (ref 2.0–4.0)
Glucose: 113 mg/dL — ABNORMAL HIGH (ref 71–99)
Osmolality Calc: 276 mOsm/kg (ref 275–300)
Potassium: 4.3 mMol/L (ref 3.5–5.3)
Protein, Total: 6.3 gm/dL (ref 6.0–8.3)
Sodium: 137 mMol/L (ref 136–147)

## 2016-12-07 LAB — CBC AND DIFFERENTIAL
Basophils %: 0 % (ref 0.0–3.0)
Basophils Absolute: 0 10*3/uL (ref 0.0–0.3)
Eosinophils Absolute: 0 10*3/uL (ref 0.0–0.8)
Hematocrit: 28.4 % — ABNORMAL LOW (ref 39.0–52.5)
Hemoglobin: 8.9 gm/dL — ABNORMAL LOW (ref 13.0–17.5)
Lymphocytes Absolute: 1.5 10*3/uL (ref 0.6–5.1)
Lymphocytes: 9 % — ABNORMAL LOW (ref 15.0–46.0)
MCH: 29 pg (ref 28–35)
MCHC: 31 gm/dL — ABNORMAL LOW (ref 32–36)
MCV: 93 fL (ref 80–100)
MPV: 6.2 fL (ref 6.0–10.0)
Monocytes Absolute: 1.5 10*3/uL (ref 0.1–1.7)
Monocytes: 9 % (ref 3.0–15.0)
Neutrophils %: 82 % — ABNORMAL HIGH (ref 42.0–78.0)
Neutrophils Absolute: 13.3 10*3/uL — ABNORMAL HIGH (ref 1.7–8.6)
PLT CT: 507 10*3/uL — ABNORMAL HIGH (ref 130–440)
RBC: 3.06 10*6/uL — ABNORMAL LOW (ref 4.00–5.70)
RDW: 16.9 % — ABNORMAL HIGH (ref 11.0–14.0)
WBC: 16.2 10*3/uL — ABNORMAL HIGH (ref 4.0–11.0)

## 2016-12-07 LAB — CBC
Hematocrit: 28.1 % — ABNORMAL LOW (ref 39.0–52.5)
Hemoglobin: 8.5 gm/dL — ABNORMAL LOW (ref 13.0–17.5)
MCH: 28 pg (ref 28–35)
MCHC: 30 gm/dL — ABNORMAL LOW (ref 32–36)
MCV: 93 fL (ref 80–100)
MPV: 6.2 fL (ref 6.0–10.0)
PLT CT: 535 10*3/uL — ABNORMAL HIGH (ref 130–440)
RBC: 3.01 10*6/uL — ABNORMAL LOW (ref 4.00–5.70)
RDW: 17 % — ABNORMAL HIGH (ref 11.0–14.0)
WBC: 14 10*3/uL — ABNORMAL HIGH (ref 4.0–11.0)

## 2016-12-07 LAB — VH DEXTROSE STICK GLUCOSE
Glucose POCT: 117 mg/dL — ABNORMAL HIGH (ref 71–99)
Glucose POCT: 127 mg/dL — ABNORMAL HIGH (ref 71–99)
Glucose POCT: 126 mg/dL — ABNORMAL HIGH (ref 71–99)
Glucose POCT: 213 mg/dL — ABNORMAL HIGH (ref 71–99)
Glucose POCT: 168 mg/dL — ABNORMAL HIGH (ref 71–99)

## 2016-12-07 LAB — MAGNESIUM: Magnesium: 2.2 mg/dL (ref 1.6–2.6)

## 2016-12-07 MED ORDER — BENZOCAINE 20% MT SOLN (WRAP)
OROMUCOSAL | Status: AC
Start: 2016-12-07 — End: ?
  Filled 2016-12-07: qty 0.28

## 2016-12-07 MED ORDER — VH MIDAZOLAM HCL 5 MG/5 ML (NARRATOR)
INTRAMUSCULAR | Status: AC | PRN
Start: 2016-12-07 — End: 2016-12-07
  Administered 2016-12-07: 1 mg via INTRAVENOUS

## 2016-12-07 MED ORDER — LIDOCAINE VISCOUS 2 % MT SOLN
OROMUCOSAL | Status: AC
Start: 2016-12-07 — End: ?
  Filled 2016-12-07: qty 15

## 2016-12-07 MED ORDER — LISINOPRIL 5 MG PO TABS
5.0000 mg | ORAL_TABLET | Freq: Every day | ORAL | Status: DC
Start: 2016-12-07 — End: 2016-12-08
  Administered 2016-12-07: 17:00:00 5 mg via ORAL
  Filled 2016-12-07 (×2): qty 1

## 2016-12-07 MED ORDER — FENTANYL CITRATE (PF) 50 MCG/ML IJ SOLN (WRAP)
INTRAMUSCULAR | Status: AC
Start: 2016-12-07 — End: ?
  Filled 2016-12-07: qty 2

## 2016-12-07 MED ORDER — MIDAZOLAM HCL 2 MG/2ML IJ SOLN
INTRAMUSCULAR | Status: AC
Start: 2016-12-07 — End: ?
  Filled 2016-12-07: qty 4

## 2016-12-07 NOTE — Progress Notes (Addendum)
[place] DAILY PROGRESS NOTE      [name]  Attending Physician: [name], MD    [name] REVIEWED INFORMATION BELOW, DISCUSSED RELEVANT POINTS WITH PATIENTS AND PERSONALLY DEVELOPED PLAN OF CARE.    Assessment/Plan:   POD # 15S/p hybrid afib ablation   Pt doing well  Weight: 76.7 today preop: 86.7. Pt on 40 lasix IV BID    Cardiac: planning for cardioversion today   Pulm: HAP on empiric abx, today is day 6 of abx treatment, pt has remained afebrile.  CXR looks slightly improved today.  Leukocytosis: 16.2 today, improving  ARF- creatinine 1.2  improved.  Nephrology following.   Bladder spasms: restarted on flomax, improving per pt  Right side weakness resolved.   Pt ambulating with PT.       Dispo: Cardioversion with TEE per Cards today. Plan for discharge tomorrow to SNF  Subjective     "Much better today"  Pt reports bladder spasms are improving and he has been able to urinate last night and this morning without issue.     Vital Signs:   Vitals:  Temp: 97.7 F (36.5 C)  Heart Rate: 71  BP: (!) 168/98  SpO2: 92 %  O2 Flow Rate (L/min): 2 L/min    Pre-Op Weight: 85.3 kg (188 lb) (11/15/16 1033)  Post-Op Weight: 76.7 kg (169 lb 1.5 oz) (12/07/16 0425)    Recent Labs  Lab 12/07/16  0355 12/06/16  2134 12/06/16  1648 12/06/16  1202 12/06/16  0757   Glucose, POCT 117* 207* 186* 170* 102*         Intake/Output Summary (Last 24 hours) at 12/07/16 0739  Last data filed at 12/07/16 0731   Gross per 24 hour   Intake              730 ml   Output              200 ml   Net              530 ml       Physical Exam:     General: awake, alert, no acute distress, pleasant, comfortable  Neck: supple  Cardiovascular: regular rate and rhythm, normal S1,S2 no murmurs, rubs or gallops  Lungs: diminished breath sounds left base, no adventitious sounds. normal respiratory effort  Abdomen:  normoactive bowel sounds, soft, non-tender, non-distended  Extremities: no clubbing, cyanosis,  trace to 1+ edema lower extremities. Trace edema right upper extremity   Neuro: Cranial nerves II-XII are grossly intact.  Motor and sensory function of the upper and lower extremities are grossly intact.    Meds:     Current Facility-Administered Medications   Medication Dose Route Frequency   . amiodarone  200 mg Oral BID   . apixaban  5 mg Oral Q12H [place]   . divalproex EC/DR tablet  500 mg Oral Q12H [place]   . docusate sodium  100 mg Oral Daily   . furosemide  40 mg Intravenous BID   . insulin glargine  10 Units Subcutaneous Q12H [facility]   . labetalol  200 mg Oral Q12H [facility]   . lactobacillus species  50 Billion CFU Oral Daily   . pantoprazole  40 mg Oral BID AC   . piperacillin-tazobactam  3.375 g Intravenous Q6H   . polyethylene glycol  17 g Oral Daily   . potassium chloride  40 mEq Oral Daily   . pravastatin  40 mg Oral QPM   .  senna  8.6 mg Oral QHS   . tamsulosin  0.4 mg Oral QD after dinner   . vancomycin  1,000 mg Intravenous Q12H   . vancomycin therapy placeholder   Does not apply See Admin Instructions     Current Facility-Administered Medications   Medication Dose Route Frequency Last Rate     Current Facility-Administered Medications   Medication Dose Route   . albuterol  2.5 mg Nebulization   . belladonna-opium  30 mg Rectal   . dextrose  125 mL Intravenous   . glucagon (rDNA)  1 mg Intramuscular   . HYDROcodone-acetaminophen  2 tablet Oral   . insulin aspart  2-24 Units Subcutaneous   . ondansetron  4 mg Oral    Or   . ondansetron  4 mg Intravenous   . promethazine  25 mg Oral    Or   . promethazine  12.5 mg Rectal    Or   . promethazine  6.25 mg Intramuscular   . traMADol  50 mg Oral             Labs:       Recent Labs  Lab 12/07/16  0536 12/06/16  0341 12/05/16  0316 12/04/16  0547 12/03/16  1109  11/30/16  0824   Sodium 137  137 136  136 137 135* 136 More results in Results Review  --    Potassium 4.3  4.3 4.2  4.2 4.0 4.0 3.9 More results in Results Review  --    Chloride 96*  96* 97*  97* 95* 94*  93* More results in Results Review  --    CO2 32.0*  32.0* 32.4*  32.4* 37.0* 32.4* 33.1* More results in Results Review  --    BUN 17  17 17  17 20 17 17  More results in Results Review  --    Creatinine 1.20  1.20 1.05  1.05 0.91 1.22 1.19 More results in Results Review  --    EGFR 62  62 73  73 87 61 63 More results in Results Review  --    Calcium 8.9  8.9 8.8  8.8 8.9 8.9 9.2 More results in Results Review  --    Magnesium  --   --   --   --   --   --  2.2   More results in Results Review = values in this interval not displayed.    Recent Labs  Lab 12/07/16  0536 12/06/16  0341 12/04/16  1620   WBC 16.2* 17.7* 23.0*   RBC 3.06* 2.90* 3.06*   Hemoglobin 8.9* 8.3* 9.1*   Hematocrit 28.4* 27.2* 28.4*   MCV 93 94 93   PLT CT 507* 352 314       Recent Labs  Lab 12/02/16  1407   PT 14.0*   PT INR 1.4*           Recent Labs  Lab 12/07/16  0536  12/02/16  1407   Bilirubin, Total 0.8 More results in Results Review 1.1   Bilirubin, Direct  --   --  0.6*   Protein, Total 6.3 More results in Results Review 5.7*   Albumin 2.5*  2.5* More results in Results Review 2.5*   ALT 64* More results in Results Review 190*   AST (SGOT) 20 More results in Results Review 18   More results in Results Review = values in this interval not displayed.    Radiology:  Radiology Results (24 Hour)     Procedure Component Value Units Date/Time    XR Chest AP Portable [[id number]] Resulted:  12/07/16 0441    Order Status:  Sent Updated:  12/07/16 0549    XR Chest AP Portable [[id number]] Collected:  12/06/16 0743    Order Status:  Completed Updated:  12/06/16 0745    Narrative:       CLINICAL HISTORY:  Reason For Exam: [place] chest tube. left pleural effusion. post op hybrid ablation and pericardiocentesis   66 years  old Male     PROCEDURE: XR CHEST AP PORTABLE    COMPARISON: 4/29      Impression:       FINDINGS/IMPRESSION:  Persistent left lower lobe consolidation with underlying effusion, which appears partially loculated. No change  from prior.      ReadingStation:[id number]          Leanor Kail, [place]  Date: Dec 07, 2016  Time: 7:39 AM    Please feel free to contact me or [name] for any further questions or concerns.    [facility]  [phone]

## 2016-12-07 NOTE — Progress Notes (Signed)
Pharmacy Vancomycin Dosing Consult Note  [name] [facility]    Assessment:   1. Day # 7 Vancomycin (also on pip/tazo) for HAP/positive sepsis screening for 77 yoM.  POD #15 s/p hybrid left ablation.  CXR persistent left effusion and basilar airspace disease.    2. Creatinine is stable, persistent leukocytosis slowly improving, afebrile.  Blood and peritoneal cultures in no growth to date. C.diff negative.      Plan:   1. Continue Vancomycin 1000 mg IV q12h  2. Next trough on or before 4/6.  3. Pharmacy will follow the patient's renal function, vancomycin levels, and dosing during the course of therapy. If you have any questions, please contact the pharmacist at [phone].     Indication: HAP/positive sepsis screening.    Age: 66 y.o.  Height: 1.702 m (5\' 7" )  Weight:  76.7 kg (169 lb 1.5 oz)  IBW: 66 kg  DW: N/A    CrCL:  63 ml/min    Other Nephrotoxic Drugs: furosemide, protonix, Zosyn, pravastatin    Drug Levels:   Trough level: 4/28 at 1041 = 17.23 mcg/ml (on vanco 1000 mg IV every 12 hours)     Pharmacokinetics:   t50: 12 hrs         Kel: 0.05669 hr-1        Vd: 60 L         Expected Trough: 17 mg/L                Recent Labs  Lab 12/07/16  0536 12/06/16  0341 12/05/16  0316 12/04/16  0547   Creatinine 1.20  1.20 1.05  1.05 0.91 1.22   BUN 17  17 17  17 20 17        Recent Labs  Lab 12/07/16  0536 12/06/16  0341 12/04/16  1620 12/04/16  0547   WBC 16.2* 17.7* 23.0* 25.3*     Temp (24hrs), Avg:97.5 F (36.4 C), Min:96.8 F (36 C), Max:97.9 F (36.6 C)      Cultures:   Date Source Organism Sensitivities Resistance   4/26 Blood x 2 ngtd

## 2016-12-07 NOTE — Procedures (Signed)
Direct Current Cardioversion  Procedure Note    [name]    Date of Birth: Nov 21, 1950    Referring Physician: [name]    Indication: Atrial Fibrillation    Pre-procedural assessment:   ASA :2  Mallampati: 2    Moderate conscious Sedation:  Moderate conscious sedation was performed. A preprocedural assessment of the patient was completed including review of past medical/surgical history,previous anesthesia sedation experience and family history. Patient's medications and drug allergies were reviewed. Patient examined with vital signs reviewed.  Incremental amounts of medications given to attain moderate level of consciousness.There was continuous face-to-face attendance by myself with trained nurse  monitoring patient's consciousness and physiologic status throughout the procedure.     My total intraservice face-to-face time for moderate sedation:15 Minutes.    Medications: Patient anesthetized with Versed 2 mg, Fentanyl 25 mcg.    Procedure:  Informed consent is obtained. TEE with no LA/LAA thrombus   A 200 joule anterior posterior synchronized biphasic shock delivered with successful cardioversion to sinus rhythm.    Complications: None    Magdalene Molly, MD

## 2016-12-07 NOTE — UM Notes (Signed)
Continued stay review for   [name]  DOB: Aug 03, 1951      12/07/2016    Assessment/Plan:  POD # 15S/p hybrid afib ablation   Pt doing well  Weight: 76.7 today preop: 86.7. Pt on 40 lasix IV BID    Cardiac: planning for cardioversion today   Pulm: HAP on empiric abx, today is day 6 of abx treatment, pt has remained afebrile.  CXR looks slightly improved today.  Leukocytosis: 16.2 today, improving  ARF- creatinine 1.2 improved. Nephrology following.   Bladder spasms: restarted on flomax, improving per pt  Right side weakness resolved.   Pt ambulating with PT.       Dispo: Cardioversion with TEE per Cards today. Plan for discharge tomorrow to SNF  Subjective    "Much better today"  Pt reports bladder spasms are improving and he has been able to urinate last night and this morning without issue.       [facility]  Utilization Review  Quintella Baton  Ph [phone]  Fax [phone]

## 2016-12-07 NOTE — Progress Notes (Signed)
Cardiology Progress Note      Date Time: 12/07/16 5:51 PM  [name], [name]      Assessment:     Active Hospital Problems    Diagnosis   . Chronic anticoagulation   . Stroke   . CKD (chronic kidney disease)   . Former smoker, stopped smoking many years ago   . Type 2 diabetes mellitus   . HTN (hypertension)   . HLD (hyperlipidemia)   . Diabetic neuropathy   . COPD (chronic obstructive pulmonary disease)   . Bipolar disorder   . BPH (benign prostatic hyperplasia)   . Atrial fibrillation   . Chronic atrial fibrillation   . Stage 4 chronic kidney disease             Plan:     1. Successful cardioversion today. Feels better, and is maintaining sinus rhythm.   2. Will need to determine course of antibiotics. He has been on Vanco/Zosyn combination for approximately 7 days. Will likely need to complete a two week course.       Subjective:   Feels good.     Physical Exam:     Temp:  [96.8 F (36 C)-97.9 F (36.6 C)] 97.9 F (36.6 C)  Heart Rate:  [68-91] 68  Resp Rate:  [16-24] 20  BP: (137-180)/(76-100) 155/86    Wt Readings from Last 3 Encounters:   12/07/16 76.7 kg (169 lb 1.5 oz)   10/21/16 85.5 kg (188 lb 9.6 oz)   10/07/16 84 kg (185 lb 3.2 oz)            Intake/Output Summary (Last 24 hours) at 12/07/16 1751  Last data filed at 12/07/16 1353   Gross per 24 hour   Intake              850 ml   Output             1550 ml   Net             -700 ml       General appearance - alert, well appearing, and in no distress  Chest -clear to auscultation, no wheezes, rales or rhonchi, symmetric air entry  Heart - normal rate, regular rhythm, normal S1, S2, no murmurs, rubs, clicks or gallops  Extremities - peripheral pulses normal, no pedal edema, no clubbing or cyanosis  Abd: soft. Non-tender, non-distended,  Neuro: alert and oriented  Skin: no rashes  Psych: normal affect    Medications:     Scheduled Meds:   amiodarone 200 mg Oral BID   apixaban 5 mg Oral Q12H SCH   divalproex EC/DR tablet 500 mg Oral  Q12H SCH   docusate sodium 100 mg Oral Daily   furosemide 40 mg Intravenous BID   insulin glargine 10 Units Subcutaneous Q12H SCH   labetalol 200 mg Oral Q12H SCH   lactobacillus species 50 Billion CFU Oral Daily   lisinopril 5 mg Oral Daily   pantoprazole 40 mg Oral BID AC   piperacillin-tazobactam 3.375 g Intravenous Q6H   polyethylene glycol 17 g Oral Daily   potassium chloride 40 mEq Oral Daily   pravastatin 40 mg Oral QPM   senna 8.6 mg Oral QHS   tamsulosin 0.4 mg Oral QD after dinner   vancomycin 1,000 mg Intravenous Q12H   vancomycin therapy placeholder  Does not apply See Admin Instructions             Labs:  Recent Labs  Lab 12/07/16  0536 12/06/16  0341 12/05/16  0316   Glucose 113*  113* 126*  126* 158*   BUN 17  17 17  17 20    Creatinine 1.20  1.20 1.05  1.05 0.91   Sodium 137  137 136  136 137   Potassium 4.3  4.3 4.2  4.2 4.0   Chloride 96*  96* 97*  97* 95*   CO2 32.0*  32.0* 32.4*  32.4* 37.0*   Magnesium 2.2  --   --    AST (SGOT) 20 22  --    ALT 64* 69*  --      Estimated Creatinine Clearance: 55.8 mL/min (based on SCr of 1.2 mg/dL).      Recent Labs  Lab 11/26/16  0302   CHOL 70*   TRIG 78   HDL 21*   LDL 33         Recent Labs  Lab 12/07/16  1622 12/07/16  0536 12/06/16  0341  12/02/16  1407   WBC 14.0* 16.2* 17.7* More results in Results Review  --    Hemoglobin 8.5* 8.9* 8.3* More results in Results Review  --    Hematocrit 28.1* 28.4* 27.2* More results in Results Review  --    PLT CT 535* 507* 352 More results in Results Review  --    PT INR  --   --   --   --  1.4*   More results in Results Review = values in this interval not displayed.    No results for input(s): TROPI, CK, CKMB in the last 8760 hours.      Recent Labs  Lab 11/26/16  0302   TSH 1.81       Radiology: all results from this admission  Xr Chest 2 Views    Result Date: 11/15/2016  Cardiomegaly with cephalization of pulmonary vascular flow. No focal infiltrate, pleural effusion, or pneumothorax.  ReadingStation:[email or web address]    Xr Abdomen Ap    Result Date: 11/24/2016  Findings worrisome for partial proximal or mid small bowel obstruction. ReadingStation:[email or web address]    Ct Head Wo Contrast    Result Date: 11/24/2016  1. No evidence of intracranial hemorrhage. 2. Probable subacute infarct superior LEFT frontoparietal lobe gyrus. 3. If clinically indicated, MRI may be helpful in further evaluation. ReadingStation:[email or web address]    Ct Chest Wo Contrast    Result Date: 11/28/2016  1.  DESCENDING THORACIC AORTIC DISSECTION. 2.  LARGE LEFT EFFUSION WITH COMPLETE LEFT LOWER LOBE COLLAPSE. 3.  CARDIOMEGALY WITH PERICARDIAL FLUID. 4.  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 ReadingStation:[email or web address]    Xr Chest Ap Only    Result Date: 11/22/2016  No pneumothorax or significant acute pulmonary abnormalities. Small amount of subcutaneous emphysema in the anterior chest. ReadingStation:[email or web address]    Xr Chest Ap Portable    Result Date: 12/07/2016  1.  NO CHANGE. 2.  PERSISTENT LEFT EFFUSION WITH ASSOCIATED BASILAR AIRSPACE DISEASE. ReadingStation:[email or web address]    Xr Chest Ap Portable    Result Date: 12/06/2016  FINDINGS/IMPRESSION: Persistent left lower lobe consolidation with underlying effusion, which appears partially loculated. No change from prior. ReadingStation:[email or web address]    Xr Chest Ap Portable    Result Date: 12/05/2016  Findings again similar to prior. Stable prominent cardiomegaly. Left-sided pleural effusion. Left retrocardiac air bronchogram. Possible mild CHF/fluid overload. Stable ectatic/aneurysmal aortic knob. ReadingStation:[email or web address]    Xr Chest Ap Portable    Result Date: 12/04/2016  Findings similar to prior. ReadingStation:[email or web address]  Xr Chest Ap Portable    Result Date: 12/03/2016  No significant change ReadingStation:[email or web address]    Xr Chest Ap Portable    Result Date: 12/02/2016  1.  NO ACUTE CARDIOPULMONARY CHANGE. 2.  NO PNEUMOTHORAX. 3.  LEFT EFFUSION AND ASSOCIATED BASILAR AIRSPACE DISEASE.  ReadingStation:[email or web address]    Xr Chest Ap Portable    Result Date: 12/01/2016  1.  NO CHANGE. 2.  LEFT CHEST TUBE WITHOUT PNEUMOTHORAX. 3.  LEFT EFFUSION WITH ASSOCIATED BASILAR AIRSPACE DISEASE. ReadingStation:[email or web address]    Xr Chest Ap Portable    Result Date: 11/30/2016  Stable small left pleural effusion with left basilar atelectasis or infiltrate. ReadingStation:[id number]    Xr Chest Ap Portable    Result Date: 11/29/2016  1.  Left apical chest tube in place without appreciable pneumothorax. 2.  Hazy opacification of the left base likely represents a combination of atelectasis and a small amount of pleural fluid. 3.  Unchanged cardiomegaly. ReadingStation:[email or web address]    Xr Chest Ap Portable    Result Date: 11/28/2016  1.  NO PNEUMOTHORAX AFTER CHEST TUBE PLACEMENT. 2.  LEFT EFFUSION NEARLY COMPLETELY RESOLVED. ReadingStation:[email or web address]    Xr Chest Ap Portable    Result Date: 11/28/2016  Increase left effusion and worsening airspace disease left lower lung. ReadingStation:[email or web address]    Xr Chest Ap Portable    Result Date: 11/26/2016  Stable pulmonary vascular congestion and left basilar atelectasis or infiltrate. Probable small left pleural effusion is unchanged. ReadingStation:[id number]    Xr Chest Ap Portable    Result Date: 11/24/2016  Cardiomegaly with bilateral airspace disease or edema with small left effusion. No observed pneumothorax. Interval removal right IJ central line. ReadingStation:[email or web address]    X-ray Chest Ap Portable    Result Date: 11/23/2016  1. Pulmonary vascular congestion without overt failure 2. Left basilar opacity likely combination of atelectasis, consolidation and small effusion. 3. Catheter appliances as above ReadingStation:[email or web address]    Xr Abdomen Portable    Result Date: 11/25/2016  No acute abnormality. [place], DO

## 2016-12-07 NOTE — Plan of Care (Signed)
Problem: Moderate/High Fall Risk Score >5  Goal: Patient will remain free of falls  Outcome: Progressing

## 2016-12-07 NOTE — Progress Notes (Signed)
Spoke with [employer]. Notified of pt's elevated BPs. Order placed to restart lisinopril.

## 2016-12-07 NOTE — Sedation Documentation (Signed)
200 joules sync afib to sr

## 2016-12-07 NOTE — Progress Notes (Signed)
Discharge Planner Progress Note  [facility]   [address]   [place] [address]      12/07/16 1350   Case Management Quick Doc   CM Comments 12/07/16:  DCP-  Patient to have TEE Cardioversion today, not ready.  DCP updated facilities and will f/u tomorrow with potential bed offers.           Alcide Evener, Arita Miss, [facility]  Discharge Planner Social Worker  [phone]

## 2016-12-08 ENCOUNTER — Inpatient Hospital Stay: Payer: Medicare Other

## 2016-12-08 LAB — RENAL FUNCTION PANEL
Albumin: 2.5 gm/dL — ABNORMAL LOW (ref 3.5–5.0)
Anion Gap: 13.2 mMol/L (ref 7.0–18.0)
BUN / Creatinine Ratio: 14.2 Ratio (ref 10.0–30.0)
BUN: 19 mg/dL (ref 7–22)
CO2: 31.6 mMol/L — ABNORMAL HIGH (ref 20.0–30.0)
Calcium: 8.8 mg/dL (ref 8.5–10.5)
Chloride: 94 mMol/L — ABNORMAL LOW (ref 98–110)
Creatinine: 1.34 mg/dL — ABNORMAL HIGH (ref 0.80–1.30)
EGFR: 54 mL/min/{1.73_m2} — ABNORMAL LOW (ref 60–150)
Glucose: 121 mg/dL — ABNORMAL HIGH (ref 71–99)
Osmolality Calc: 274 mOsm/kg — ABNORMAL LOW (ref 275–300)
Phosphorus: 3.3 mg/dL (ref 2.3–4.7)
Potassium: 3.8 mMol/L (ref 3.5–5.3)
Sodium: 135 mMol/L — ABNORMAL LOW (ref 136–147)

## 2016-12-08 LAB — ECG 12-LEAD
P Wave Axis: 81 deg
P-R Interval: 200 ms
Patient Age: 65 years
Q-T Interval(Corrected): 469 ms
Q-T Interval: 440 ms
QRS Axis: 31 deg
QRS Duration: 101 ms
T Axis: 145 years
Ventricular Rate: 68 //min

## 2016-12-08 LAB — CBC AND DIFFERENTIAL
Bands: 1 % (ref 0–10)
Basophils %: 0 % (ref 0.0–3.0)
Basophils Absolute: 0 10*3/uL (ref 0.0–0.3)
Eosinophils %: 1 % (ref 0.0–7.0)
Eosinophils Absolute: 0.1 10*3/uL (ref 0.0–0.8)
Hematocrit: 28.7 % — ABNORMAL LOW (ref 39.0–52.5)
Hemoglobin: 8.8 gm/dL — ABNORMAL LOW (ref 13.0–17.5)
Lymphocytes Absolute: 1.5 10*3/uL (ref 0.6–5.1)
Lymphocytes: 10 % — ABNORMAL LOW (ref 15.0–46.0)
MCH: 28 pg (ref 28–35)
MCHC: 31 gm/dL — ABNORMAL LOW (ref 32–36)
MCV: 93 fL (ref 80–100)
MPV: 6 fL (ref 6.0–10.0)
Monocytes Absolute: 1 10*3/uL (ref 0.1–1.7)
Monocytes: 7 % (ref 3.0–15.0)
Neutrophils %: 81 % — ABNORMAL HIGH (ref 42.0–78.0)
Neutrophils Absolute: 12.1 10*3/uL — ABNORMAL HIGH (ref 1.7–8.6)
PLT CT: 573 10*3/uL — ABNORMAL HIGH (ref 130–440)
RBC: 3.1 10*6/uL — ABNORMAL LOW (ref 4.00–5.70)
RDW: 16.8 % — ABNORMAL HIGH (ref 11.0–14.0)
WBC: 14.8 10*3/uL — ABNORMAL HIGH (ref 4.0–11.0)

## 2016-12-08 LAB — COMPREHENSIVE METABOLIC PANEL
ALT: 51 U/L (ref 0–55)
AST (SGOT): 20 U/L (ref 10–42)
Albumin/Globulin Ratio: 0.68 Ratio — ABNORMAL LOW (ref 0.70–1.50)
Albumin: 2.5 gm/dL — ABNORMAL LOW (ref 3.5–5.0)
Alkaline Phosphatase: 116 U/L (ref 40–145)
Anion Gap: 13.2 mMol/L (ref 7.0–18.0)
BUN / Creatinine Ratio: 14.2 Ratio (ref 10.0–30.0)
BUN: 19 mg/dL (ref 7–22)
Bilirubin, Total: 0.8 mg/dL (ref 0.1–1.2)
CO2: 31.6 mMol/L — ABNORMAL HIGH (ref 20.0–30.0)
Calcium: 8.8 mg/dL (ref 8.5–10.5)
Chloride: 94 mMol/L — ABNORMAL LOW (ref 98–110)
Creatinine: 1.34 mg/dL — ABNORMAL HIGH (ref 0.80–1.30)
EGFR: 54 mL/min/{1.73_m2} — ABNORMAL LOW (ref 60–150)
Globulin: 3.7 gm/dL (ref 2.0–4.0)
Glucose: 121 mg/dL — ABNORMAL HIGH (ref 71–99)
Osmolality Calc: 274 mOsm/kg — ABNORMAL LOW (ref 275–300)
Potassium: 3.8 mMol/L (ref 3.5–5.3)
Protein, Total: 6.2 gm/dL (ref 6.0–8.3)
Sodium: 135 mMol/L — ABNORMAL LOW (ref 136–147)

## 2016-12-08 LAB — VH DEXTROSE STICK GLUCOSE
Glucose POCT: 147 mg/dL — ABNORMAL HIGH (ref 71–99)
Glucose POCT: 103 mg/dL — ABNORMAL HIGH (ref 71–99)
Glucose POCT: 201 mg/dL — ABNORMAL HIGH (ref 71–99)
Glucose POCT: 199 mg/dL — ABNORMAL HIGH (ref 71–99)
Glucose POCT: 164 mg/dL — ABNORMAL HIGH (ref 71–99)
Glucose POCT: 248 mg/dL — ABNORMAL HIGH (ref 71–99)

## 2016-12-08 MED ORDER — CEFDINIR 300 MG PO CAPS
300.0000 mg | ORAL_CAPSULE | Freq: Two times a day (BID) | ORAL | Status: DC
Start: 2016-12-08 — End: 2016-12-09
  Administered 2016-12-08 – 2016-12-09 (×2): 300 mg via ORAL
  Filled 2016-12-08 (×3): qty 1

## 2016-12-08 NOTE — Progress Notes (Signed)
Pharmacy Vancomycin Dosing Consult Note  [facility]    Assessment:   1. Day # 7 Vancomycin (also on pip/tazo) for HAP/positive sepsis screening for 6 yoM.  POD #16 s/p hybrid afib ablation.  CXR 5/2 slight improved aeration left lower lobe.  2. Creatinine is increasing, persistent leukocytosis slowly improving, afebrile.  Blood and peritoneal cultures in no growth to date. C.diff negative. Last vanc level was 17.23 mg/L on 4/28.      Plan:   1. Continue Vancomycin 1000 mg IV q12h. Trough tonight at 2200 d/t rising Scr. Hold dose for level above 20.  2. Pharmacy will follow the patient's renal function, vancomycin levels, and dosing during the course of therapy. If you have any questions, please contact the pharmacist at [phone].     Indication: HAP/positive sepsis screening.    Age: 66 y.o.  Height: 1.702 m (5\' 7" )  Weight:  76 kg (167 lb 8.8 oz)  IBW: 66 kg  DW: N/A    CrCL:  50 ml/min    Other Nephrotoxic Drugs: furosemide, Zosyn    Drug Levels:   Trough level: 4/28 at 1041 = 17.23 mcg/ml (on vanco 1000 mg IV every 12 hours)     Pharmacokinetics:   t50: 12 hrs         Kel: 0.05669 hr-1        Vd: 60 L         Expected Trough: 17 mg/L                Recent Labs  Lab 12/08/16  0513 12/07/16  0536 12/06/16  0341 12/05/16  0316   Creatinine 1.34*  1.34* 1.20  1.20 1.05  1.05 0.91   BUN 19  19 17  17 17  17 20        Recent Labs  Lab 12/08/16  0513 12/07/16  1622 12/07/16  0536 12/06/16  0341   WBC 14.8* 14.0* 16.2* 17.7*     Temp (24hrs), Avg:97.9 F (36.6 C), Min:97.5 F (36.4 C), Max:98.2 F (36.8 C)      Cultures:   Date Source Organism Sensitivities Resistance   4/26 Blood x 2 ngtd

## 2016-12-08 NOTE — Progress Notes (Signed)
Cardiology Progress Note      Date Time: 12/08/16 2:28 PM  [name], [name]      Assessment:     Active Hospital Problems    Diagnosis   . Chronic anticoagulation   . Stroke   . CKD (chronic kidney disease)   . Former smoker, stopped smoking many years ago   . Type 2 diabetes mellitus   . HTN (hypertension)   . HLD (hyperlipidemia)   . Diabetic neuropathy   . COPD (chronic obstructive pulmonary disease)   . Bipolar disorder   . BPH (benign prostatic hyperplasia)   . Atrial fibrillation   . Chronic atrial fibrillation   . Stage 4 chronic kidney disease             Plan:     1. Creatinine is back to baseline (1.3-1.4).   2. No fevers, CXR appears better.   3. Oral antibiotics  4. Non-sustained VT (or AT with aberrancy, continue to monitor).   5.       Subjective:   Feels better.     Physical Exam:     Temp:  [97.5 F (36.4 C)-98.2 F (36.8 C)] 97.5 F (36.4 C)  Heart Rate:  [64-70] 68  Resp Rate:  [16-20] 18  BP: (134-162)/(81-97) 144/81    Wt Readings from Last 3 Encounters:   12/08/16 76 kg (167 lb 8.8 oz)   10/21/16 85.5 kg (188 lb 9.6 oz)   10/07/16 84 kg (185 lb 3.2 oz)            Intake/Output Summary (Last 24 hours) at 12/08/16 1428  Last data filed at 12/08/16 1359   Gross per 24 hour   Intake              780 ml   Output              900 ml   Net             -120 ml       General appearance - alert, well appearing, and in no distress  Chest -clear to auscultation, no wheezes, rales or rhonchi, symmetric air entry  Heart - normal rate and regular rhythm, 1/6 systolic ejection murmur LSM  Extremities - peripheral pulses normal, no pedal edema, no clubbing or cyanosis, no cyanosis  Abd: soft. Non-tender, non-distended,  Neuro: alert and oriented  Skin: no rashes  Psych: normal affect    Medications:     Scheduled Meds:   amiodarone 200 mg Oral BID   apixaban 5 mg Oral Q12H [place]   divalproex EC/DR tablet 500 mg Oral Q12H SCH   docusate sodium 100 mg Oral Daily   furosemide 40 mg Intravenous  BID   insulin glargine 10 Units Subcutaneous Q12H SCH   labetalol 200 mg Oral Q12H SCH   lactobacillus species 50 Billion CFU Oral Daily   pantoprazole 40 mg Oral BID AC   piperacillin-tazobactam 3.375 g Intravenous Q6H   polyethylene glycol 17 g Oral Daily   potassium chloride 40 mEq Oral Daily   pravastatin 40 mg Oral QPM   senna 8.6 mg Oral QHS   tamsulosin 0.4 mg Oral QD after dinner   vancomycin 1,000 mg Intravenous Q12H   vancomycin therapy placeholder  Does not apply See Admin Instructions             Labs:       Recent Labs  Lab 12/08/16  0513 12/07/16  0536 12/06/16  0341   Glucose 121*  121* 113*  113* 126*  126*   BUN 19  19 17  17 17  17    Creatinine 1.34*  1.34* 1.20  1.20 1.05  1.05   Sodium 135*  135* 137  137 136  136   Potassium 3.8  3.8 4.3  4.3 4.2  4.2   Chloride 94*  94* 96*  96* 97*  97*   CO2 31.6*  31.6* 32.0*  32.0* 32.4*  32.4*   Magnesium  --  2.2  --    AST (SGOT) 20 20 22    ALT 51 64* 69*     Estimated Creatinine Clearance: 50 mL/min (A) (based on SCr of 1.34 mg/dL (H)).      Recent Labs  Lab 11/26/16  0302   CHOL 70*   TRIG 78   HDL 21*   LDL 33         Recent Labs  Lab 12/08/16  0513 12/07/16  1622 12/07/16  0536  12/02/16  1407   WBC 14.8* 14.0* 16.2* More results in Results Review  --    Hemoglobin 8.8* 8.5* 8.9* More results in Results Review  --    Hematocrit 28.7* 28.1* 28.4* More results in Results Review  --    PLT CT 573* 535* 507* More results in Results Review  --    PT INR  --   --   --   --  1.4*   More results in Results Review = values in this interval not displayed.    No results for input(s): TROPI, CK, CKMB in the last 8760 hours.      Recent Labs  Lab 11/26/16  0302   TSH 1.81       Radiology: all results from this admission  Xr Chest 2 Views    Result Date: 11/15/2016  Cardiomegaly with cephalization of pulmonary vascular flow. No focal infiltrate, pleural effusion, or pneumothorax. ReadingStation:[email or web address]    Xr Abdomen Ap    Result Date:  11/24/2016  Findings worrisome for partial proximal or mid small bowel obstruction. ReadingStation:[email or web address]    Ct Head Wo Contrast    Result Date: 11/24/2016  1. No evidence of intracranial hemorrhage. 2. Probable subacute infarct superior LEFT frontoparietal lobe gyrus. 3. If clinically indicated, MRI may be helpful in further evaluation. ReadingStation:[email or web address]    Ct Chest Wo Contrast    Result Date: 11/28/2016  1.  DESCENDING THORACIC AORTIC DISSECTION. 2.  LARGE LEFT EFFUSION WITH COMPLETE LEFT LOWER LOBE COLLAPSE. 3.  CARDIOMEGALY WITH PERICARDIAL FLUID. 4.  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 ReadingStation:[email or web address]    Xr Chest Ap Only    Result Date: 11/22/2016  No pneumothorax or significant acute pulmonary abnormalities. Small amount of subcutaneous emphysema in the anterior chest. ReadingStation:[email or web address]    Xr Chest Ap Portable    Result Date: 12/08/2016  Slight improved aeration left lower lobe as above. No pneumothorax ReadingStation:[email or web address]    Xr Chest Ap Portable    Result Date: 12/07/2016  1.  NO CHANGE. 2.  PERSISTENT LEFT EFFUSION WITH ASSOCIATED BASILAR AIRSPACE DISEASE. ReadingStation:[email or web address]    Xr Chest Ap Portable    Result Date: 12/06/2016  FINDINGS/IMPRESSION: Persistent left lower lobe consolidation with underlying effusion, which appears partially loculated. No change from prior. ReadingStation:[email or web address]    Xr Chest Ap Portable    Result Date: 12/05/2016  Findings again similar to prior. Stable prominent cardiomegaly. Left-sided pleural effusion. Left retrocardiac air bronchogram. Possible mild CHF/fluid overload.  Stable ectatic/aneurysmal aortic knob. ReadingStation:[email or web address]    Xr Chest Ap Portable    Result Date: 12/04/2016  Findings similar to prior. ReadingStation:[email or web address]    Xr Chest Ap Portable    Result Date: 12/03/2016  No significant change ReadingStation:[email or web address]    Xr Chest Ap Portable    Result Date: 12/02/2016  1.  NO ACUTE CARDIOPULMONARY CHANGE. 2.   NO PNEUMOTHORAX. 3.  LEFT EFFUSION AND ASSOCIATED BASILAR AIRSPACE DISEASE. ReadingStation:[email or web address]    Xr Chest Ap Portable    Result Date: 12/01/2016  1.  NO CHANGE. 2.  LEFT CHEST TUBE WITHOUT PNEUMOTHORAX. 3.  LEFT EFFUSION WITH ASSOCIATED BASILAR AIRSPACE DISEASE. ReadingStation:[email or web address]    Xr Chest Ap Portable    Result Date: 11/30/2016  Stable small left pleural effusion with left basilar atelectasis or infiltrate. ReadingStation:[id number]    Xr Chest Ap Portable    Result Date: 11/29/2016  1.  Left apical chest tube in place without appreciable pneumothorax. 2.  Hazy opacification of the left base likely represents a combination of atelectasis and a small amount of pleural fluid. 3.  Unchanged cardiomegaly. ReadingStation:[email or web address]    Xr Chest Ap Portable    Result Date: 11/28/2016  1.  NO PNEUMOTHORAX AFTER CHEST TUBE PLACEMENT. 2.  LEFT EFFUSION NEARLY COMPLETELY RESOLVED. ReadingStation:[email or web address]    Xr Chest Ap Portable    Result Date: 11/28/2016  Increase left effusion and worsening airspace disease left lower lung. ReadingStation:[email or web address]    Xr Chest Ap Portable    Result Date: 11/26/2016  Stable pulmonary vascular congestion and left basilar atelectasis or infiltrate. Probable small left pleural effusion is unchanged. ReadingStation:[id number]    Xr Chest Ap Portable    Result Date: 11/24/2016  Cardiomegaly with bilateral airspace disease or edema with small left effusion. No observed pneumothorax. Interval removal right IJ central line. ReadingStation:[email or web address]    X-ray Chest Ap Portable    Result Date: 11/23/2016  1. Pulmonary vascular congestion without overt failure 2. Left basilar opacity likely combination of atelectasis, consolidation and small effusion. 3. Catheter appliances as above ReadingStation:[email or web address]    Xr Abdomen Portable    Result Date: 11/25/2016  No acute abnormality. [place], DO

## 2016-12-08 NOTE — Plan of Care (Signed)
Problem: Pain interferes with ability to perform ADL  Goal: Pain at adequate level as identified by patient  Outcome: Progressing   12/08/16 1809   Goal/Interventions addressed this shift   Pain at adequate level as identified by patient Identify patient comfort function goal;Assess for risk of opioid induced respiratory depression, including snoring/sleep apnea. Alert healthcare team of risk factors identified.;Assess pain on admission, during daily assessment and/or before any "as needed" intervention(s);Reassess pain within 30-60 minutes of any procedure/intervention, per Pain Assessment, Intervention, Reassessment (AIR) Cycle;Evaluate if patient comfort function goal is met;Evaluate patient's satisfaction with pain management progress;Offer non-pharmacological pain management interventions;Consult/collaborate with Pain Service;Consult/collaborate with Physical Therapy, Occupational Therapy, and/or Speech Therapy;Include patient/patient care companion in decisions related to pain management as needed

## 2016-12-08 NOTE — Progress Notes (Signed)
[place] DAILY PROGRESS NOTE      [name]  Attending Physician: [name], MD    [name] REVIEWED INFORMATION BELOW, DISCUSSED RELEVANT POINTS WITH PATIENTS AND PERSONALLY DEVELOPED PLAN OF CARE.    Assessment/Plan:   POD #  16  S/p Hybrid afib ablation procedure , pericardial effusion, Lt. Pleural effusion           Dispo: JUst had 7 beat run of V tach , asymptomatic , wil discuss with Dr. [name] ID is not set. BP, was on lisinipril in addition to labetolol , with slight increase in creatinine, nedd to discuss antibiotics , as well as far as Dispo in concerned for SNF creatinine 1.34 up from 1.20 before lisinipril H and H stable at 8.8 and 28.7 WBC 14.8 T max 98.2         Subjective          HPI/Subjective: 66 y.o. year old male with above stated recent PMH    Vital Signs:   Vitals:  Temp: 97.7 F (36.5 C)  Heart Rate: 66  BP: (!) 159/91  SpO2: 92 %  O2 Flow Rate (L/min): 2 L/min    Pre-Op Weight: 85.3 kg (188 lb) (11/15/16 1033)  Post-Op Weight: 76 kg (167 lb 8.8 oz) (12/08/16 0238)    Recent Labs  Lab 12/08/16  0943 12/08/16  0746 12/08/16  0236 12/07/16  2056 12/07/16  1713   Glucose, POCT 201* 103* 147* 168* 213*         Intake/Output Summary (Last 24 hours) at 12/08/16 1019  Last data filed at 12/08/16 0553   Gross per 24 hour   Intake              990 ml   Output              700 ml   Net              290 ml       Physical Exam:     General: awake, alert, no acute distress, pleasant, comfortable  Neck: supple  Cardiovascular: regular rate and rhythm, normal S1,S2 no murmurs, rubs or gallops  Lungs: clear to auscultation bilaterally, normal respiratory effort  Abdomen:  normoactive bowel sounds, soft, non-tender, non-distended  Extremities: no clubbing, cyanosis, or edema.  Neuro: Cranial nerves II-XII are grossly intact.  Motor and sensory function of the upper and lower extremities are grossly intact.    Meds:     Current Facility-Administered  Medications   Medication Dose Route Frequency   . amiodarone  200 mg Oral BID   . apixaban  5 mg Oral Q12H [place]   . divalproex EC/DR tablet  500 mg Oral Q12H [place]   . docusate sodium  100 mg Oral Daily   . furosemide  40 mg Intravenous BID   . insulin glargine  10 Units Subcutaneous Q12H SCH   . labetalol  200 mg Oral Q12H SCH   . lactobacillus species  50 Billion CFU Oral Daily   . pantoprazole  40 mg Oral BID AC   . piperacillin-tazobactam  3.375 g Intravenous Q6H   . polyethylene glycol  17 g Oral Daily   . potassium chloride  40 mEq Oral Daily   . pravastatin  40 mg Oral QPM   . senna  8.6 mg Oral QHS   . tamsulosin  0.4 mg Oral QD after dinner   . vancomycin  1,000 mg Intravenous Q12H   .  vancomycin therapy placeholder   Does not apply See Admin Instructions     Current Facility-Administered Medications   Medication Dose Route Frequency Last Rate     Current Facility-Administered Medications   Medication Dose Route   . albuterol  2.5 mg Nebulization   . belladonna-opium  30 mg Rectal   . dextrose  125 mL Intravenous   . glucagon (rDNA)  1 mg Intramuscular   . HYDROcodone-acetaminophen  2 tablet Oral   . insulin aspart  2-24 Units Subcutaneous   . ondansetron  4 mg Oral    Or   . ondansetron  4 mg Intravenous   . promethazine  25 mg Oral    Or   . promethazine  12.5 mg Rectal    Or   . promethazine  6.25 mg Intramuscular   . traMADol  50 mg Oral             Labs:       Recent Labs  Lab 12/08/16  0513 12/07/16  0536 12/06/16  0341 12/05/16  0316 12/04/16  0547   Sodium 135*  135* 137  137 136  136 137 135*   Potassium 3.8  3.8 4.3  4.3 4.2  4.2 4.0 4.0   Chloride 94*  94* 96*  96* 97*  97* 95* 94*   CO2 31.6*  31.6* 32.0*  32.0* 32.4*  32.4* 37.0* 32.4*   BUN 19  19 17  17 17  17 20 17    Creatinine 1.34*  1.34* 1.20  1.20 1.05  1.05 0.91 1.22   EGFR 54*  54* 62  62 73  73 87 61   Calcium 8.8  8.8 8.9  8.9 8.8  8.8 8.9 8.9   Magnesium  --  2.2  --   --   --        Recent Labs  Lab  12/08/16  0513 12/07/16  1622 12/07/16  0536   WBC 14.8* 14.0* 16.2*   RBC 3.10* 3.01* 3.06*   Hemoglobin 8.8* 8.5* 8.9*   Hematocrit 28.7* 28.1* 28.4*   MCV 93 93 93   PLT CT 573* 535* 507*       Recent Labs  Lab 12/02/16  1407   PT 14.0*   PT INR 1.4*           Recent Labs  Lab 12/08/16  0513  12/02/16  1407   Bilirubin, Total 0.8 More results in Results Review 1.1   Bilirubin, Direct  --   --  0.6*   Protein, Total 6.2 More results in Results Review 5.7*   Albumin 2.5*  2.5* More results in Results Review 2.5*   ALT 51 More results in Results Review 190*   AST (SGOT) 20 More results in Results Review 18   More results in Results Review = values in this interval not displayed.    Radiology:     Radiology Results (24 Hour)     Procedure Component Value Units Date/Time    XR Chest AP Portable [[id number]] Collected:  12/08/16 0725    Order Status:  Completed Updated:  12/08/16 0728    Narrative:       Clinical History:  Reason For Exam: [place] chest tube. left pleural effusion. post op hybrid ablation and pericardiocentesis    Examination:  Frontal view of the chest.    Comparison:  12/07/2016    Findings:  Slightly improved aeration left lower lobe. There is residual atelectasis, small effusion and  consolidative infiltrate. The right lung is clear. Heart size remains mildly enlarged is no overt failure or pneumothorax.      Impression:       Slight improved aeration left lower lobe as above. No pneumothorax    ReadingStation:[email or web address]          Rosanne Sack, [place]  Date: Dec 08, 2016  Time: 10:19 AM    Please feel free to contact me or [name] for any further questions or concerns.    [facility]  [phone]

## 2016-12-08 NOTE — PT Progress Note (Signed)
[facility]: [facility]  Department of Rehabilitation Services: [phone]  Phu Record    CSN: [id number]    SURG TELEMETRY STEP-DOWN   330/330-A    Physical Therapy Treatment Note    Time of treatment:   Time Calculation  PT Received On: 12/08/16  Start Time: 0918  Stop Time: 0941  Time Calculation (min): 23 min    Visit#: 5    Last seen by Physical therapist vs. PTA: 12/06/2016    Medical Diagnosis/Pertinent medical/surgical details: hybrid convergent atrial ablation procedure on 11/22/16. On 11/25/16, the patient was transferred to the ICU with shortness of breath. The patient had a large pericardial effusion drained on 11/25/16. The patient is also presenting with right sided weakness, particularly on the right upper extremity. 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 Cardioversion performed 12/07/2016    Precautions and Contraindications:  Falls  Mobility protocol   HR 50-120, SBP 90-180, DBP 50-100    Assessment:   Patient's progress towards established goals: Patient able to participate in supine exercises and then was able to ambulate 13' with minimal assist and walker. Patient motivated to participate in session. He did require frequent rest breaks due to fatigue. Patient required only assist of 1 throughout all mobility.    Patient continues to have the following impairments: decreased strength, decreased safety/judgement during functional mobility, decreased activity tolerance, decreased functional mobility, decreased balance, gait deficits, pain    Patient will continue to benefit from skilled PT services in order to address above impairments to facilitate return to PLOF.       Goals:   To be completed by discharge:  1. The patient will complete all bed mobility with supervision in order to prepare to transfer. ONGOING  2. The patient will complete sit to stand and stand to sit transfers with supervision in order to prepare to  ambulate. MET  3. The patient will ambulate 150 feet with supervision with LRAD in order to demonstrate independence with mobility. ONGOING  4. The patient will ascend/descend 7 stairs with 2 rails with supervision in order to demonstrate the ability to get into his home. ONGOING     Plan:   Treatment/interventions: Exercise, Gait training, Stair training, Neuromuscular re-education, Functional transfer training, Patient/caregiver training, Bed mobility    Treatment Frequency: 4-5x/wk    DISCHARGE RECOMMENDATIONS   DME recommended for Discharge:   TBD at next level of care    Discharge Recommendations:   Acute Rehab         Subjective:   "I'm getting better. I know this is going to take time, but I am getting better"   Patient is agreeable to participation in the therapy session. Nursing clears patient for therapy.    Pain:  With Activity: 7/10  Location: Lower back  Interventions: Repositioned, RN/LPN aware    OBJECTIVE:   Observation of Patient/Vital Signs:   Patient is in bed with telemetry  Patient's medical condition is appropriate for Physical therapy intervention at this time.    Vital Signs:  BP Supine:  144/79 mmHg  BP after activity: 147/87 mmHg  HR Supine: 71 bpm  HR after activity: 71 bpm    Oriented to: Oriented x4  Command following: Follows 1 step commands with increased time  Alertness/Arousal: Appropriate responses to stimuli   Attention Span:Appears intact  Memory: Appears intact  Safety Awareness: minimal verbal instruction  Insights: Educated in safety awareness    Musculoskeletal  and Balance Details:                Bed Mobility:   Supine to Sit:   Modified independence .   HOB elevated, Bed rail used  Sit to Supine:   Supervision.   Cues for Sequencing., HOB flat, Bed rail used    Transfers:  Sit to Stand:  Supervision with Front wheeled walker, Gait belt.    Cues for Sequencing, Cues for Hand Placement  Stand to Sit:  Supervision.    Cues for Sequencing, Cues for Hand  Placement    Locomotion:  LEVEL AMBULATION:  Distance: 38'   Assistance level:  Minimal assist  Device:  Front wheeled walker, Gait belt  Pattern:  Step through, Decreased cadence, Decreased step length:  bilaterally, Decreased clearance:  bilaterally  Comments: No loss of balance noted. Cues given to encourage patient to increase clearance.  Distance limited by: fatigue    Participation and Activity Tolerance   Participation effort: Good  Activity Tolerance: Tolerates 10-20 minutes of activity with multiple rests    Other Treatment Interventions this session:   Therapeutic exercise:  Supine: Ankle pump: Bil 10x   Heel slide: Bil 10x  Hip add/abd slides: Bil 10x  Therapeutic activity  Gait training  Patient/family/caregiver education     Education Provided:   TOPICS: role of physical therapy, plan of care, goals of therapy and HEP, safety with mobility and ADLs, benefits of activity, energy conservation techniques, activity with nursing    Learner educated: Patient  Method: Explanation  Response to education: Verbalized understanding    Patient Position at End of Treatment:   Supine, in bed, Staff present: RN to give meds, Needs in reach, Bed/chair alarm set, No distress and SCD's/foot pumps applied    Team Communication:   Spoke to : RN/LPN Maryruth Hancock  Regarding: Pre-session re: patient status, Patient position at end of session, Patient participation with Therapy, Further recommendations  Whiteboard updated: Yes  PT/PTA communication: via written note and verbal communication as needed.      Recommend patient sit on side of bed for meals, walk in hall with walker and staff assist outside of PT sessions.    [name] [employer]

## 2016-12-08 NOTE — Plan of Care (Signed)
Problem: Moderate/High Fall Risk Score >5  Goal: Patient will remain free of falls  Outcome: Progressing   12/07/16 2017 12/08/16 2200   OTHER   Moderate Risk (6-13) --  MOD-(VH Only) Consider use of "STOP Call For Help" sign;MOD-(VH Only) Consider video monitoring;MOD-(VH Only) Consider roll guard;MOD-Place Fall Risk level on whiteboard in room;MOD-Include family in multidisciplinary POC discussions;MOD-Request PT/OT consult order for patients with gait/mobility impairment;MOD-Utilize diversion activities;MOD-Re-orient confused patients;MOD-Remain with patient during toileting;MOD-Use of assistive devices-bedside commode if appropriate;MOD-Consider a move closer to Nurses Station;MOD-Use of chair-pad alarm when appropriate;MOD-(VH Only) Place "Reset Bed Alarm" sign above bed if in use;MOD-Apply bed exit alarm if patient is confused;MOD-Consider activation of bed alarm if appropriate;MOD-(VH Only) Apply yellow "Fall Risk" arm band;MOD-(VH Only) Yellow slippers;MOD-Initiate Yellow "Fall Risk" magnet communication tool;MOD-(VH Only) Yellow "Fall Risk" signage;LOW-Fall Interventions Appropriate for Low Fall Risk   High (Greater than 13) HIGH-(VH Only) Keep door open for better visability --      Patient instructed to call for staff when needing OOB. Nonskids in place. Call bell within reach. Will monitor.

## 2016-12-08 NOTE — Progress Notes (Signed)
Discharge Planner Progress Note  [facility]   [address]   [place] [address]        12/08/16 1051   CM Review   CM Comments 12/08/16:  DCP-  Earlier this morning, plan was for possible d/c today or tomorrow.  DCP attempting to obtain bed offers for today, when informed patient's BP and creatinine are elevated and had a 7 beat run of VT.  Dr. [name] to see.  Medication adjustments.  Updated facilities.  DCP will f/u tomorrow.  PA aware of forms on chart need signed by attending.  DCP following.       Alcide Evener, Arita Miss, [facility]  Discharge Planner Social Worker  [phone]

## 2016-12-09 ENCOUNTER — Inpatient Hospital Stay: Payer: Medicare Other

## 2016-12-09 LAB — COMPREHENSIVE METABOLIC PANEL
ALT: 44 U/L (ref 0–55)
AST (SGOT): 27 U/L (ref 10–42)
Albumin/Globulin Ratio: 0.63 Ratio — ABNORMAL LOW (ref 0.70–1.50)
Albumin: 2.5 gm/dL — ABNORMAL LOW (ref 3.5–5.0)
Alkaline Phosphatase: 117 U/L (ref 40–145)
Anion Gap: 11.8 mMol/L (ref 7.0–18.0)
BUN / Creatinine Ratio: 14.2 Ratio (ref 10.0–30.0)
BUN: 16 mg/dL (ref 7–22)
Bilirubin, Total: 0.8 mg/dL (ref 0.1–1.2)
CO2: 30.9 mMol/L — ABNORMAL HIGH (ref 20.0–30.0)
Calcium: 8.9 mg/dL (ref 8.5–10.5)
Chloride: 96 mMol/L — ABNORMAL LOW (ref 98–110)
Creatinine: 1.13 mg/dL (ref 0.80–1.30)
EGFR: 67 mL/min/{1.73_m2} (ref 60–150)
Globulin: 4 gm/dL (ref 2.0–4.0)
Glucose: 115 mg/dL — ABNORMAL HIGH (ref 71–99)
Osmolality Calc: 272 mOsm/kg — ABNORMAL LOW (ref 275–300)
Potassium: 3.7 mMol/L (ref 3.5–5.3)
Protein, Total: 6.5 gm/dL (ref 6.0–8.3)
Sodium: 135 mMol/L — ABNORMAL LOW (ref 136–147)

## 2016-12-09 LAB — RENAL FUNCTION PANEL
Albumin: 2.5 gm/dL — ABNORMAL LOW (ref 3.5–5.0)
Anion Gap: 11.8 mMol/L (ref 7.0–18.0)
BUN / Creatinine Ratio: 14.2 Ratio (ref 10.0–30.0)
BUN: 16 mg/dL (ref 7–22)
CO2: 30.9 mMol/L — ABNORMAL HIGH (ref 20.0–30.0)
Calcium: 8.9 mg/dL (ref 8.5–10.5)
Chloride: 96 mMol/L — ABNORMAL LOW (ref 98–110)
Creatinine: 1.13 mg/dL (ref 0.80–1.30)
EGFR: 67 mL/min/{1.73_m2} (ref 60–150)
Glucose: 115 mg/dL — ABNORMAL HIGH (ref 71–99)
Osmolality Calc: 272 mOsm/kg — ABNORMAL LOW (ref 275–300)
Phosphorus: 3 mg/dL (ref 2.3–4.7)
Potassium: 3.7 mMol/L (ref 3.5–5.3)
Sodium: 135 mMol/L — ABNORMAL LOW (ref 136–147)

## 2016-12-09 LAB — CBC AND DIFFERENTIAL
Bands: 1 % (ref 0–10)
Basophils %: 0 % (ref 0.0–3.0)
Basophils Absolute: 0 10*3/uL (ref 0.0–0.3)
Eosinophils %: 0 % (ref 0.0–7.0)
Eosinophils Absolute: 0 10*3/uL (ref 0.0–0.8)
Hematocrit: 29.2 % — ABNORMAL LOW (ref 39.0–52.5)
Hemoglobin: 8.9 gm/dL — ABNORMAL LOW (ref 13.0–17.5)
Lymphocytes Absolute: 0.5 10*3/uL — ABNORMAL LOW (ref 0.6–5.1)
Lymphocytes: 4 % — ABNORMAL LOW (ref 15.0–46.0)
MCH: 28 pg (ref 28–35)
MCHC: 30 gm/dL — ABNORMAL LOW (ref 32–36)
MCV: 93 fL (ref 80–100)
MPV: 5.9 fL — ABNORMAL LOW (ref 6.0–10.0)
Monocytes Absolute: 1.2 10*3/uL (ref 0.1–1.7)
Monocytes: 9 % (ref 3.0–15.0)
Neutrophils %: 86 % — ABNORMAL HIGH (ref 42.0–78.0)
Neutrophils Absolute: 11.6 10*3/uL — ABNORMAL HIGH (ref 1.7–8.6)
PLT CT: 621 10*3/uL — ABNORMAL HIGH (ref 130–440)
RBC: 3.14 10*6/uL — ABNORMAL LOW (ref 4.00–5.70)
RDW: 16.9 % — ABNORMAL HIGH (ref 11.0–14.0)
WBC: 13.3 10*3/uL — ABNORMAL HIGH (ref 4.0–11.0)

## 2016-12-09 LAB — VH DEXTROSE STICK GLUCOSE
Glucose POCT: 125 mg/dL — ABNORMAL HIGH (ref 71–99)
Glucose POCT: 119 mg/dL — ABNORMAL HIGH (ref 71–99)
Glucose POCT: 207 mg/dL — ABNORMAL HIGH (ref 71–99)

## 2016-12-09 LAB — MAGNESIUM: Magnesium: 2.1 mg/dL (ref 1.6–2.6)

## 2016-12-09 MED ORDER — METFORMIN HCL 1000 MG PO TABS
500.0000 mg | ORAL_TABLET | Freq: Two times a day (BID) | ORAL | 0 refills | Status: DC
Start: 2016-12-09 — End: 2017-10-11

## 2016-12-09 MED ORDER — HYDROCODONE-ACETAMINOPHEN 5-325 MG PO TABS
2.0000 | ORAL_TABLET | Freq: Four times a day (QID) | ORAL | 0 refills | Status: DC | PRN
Start: 2016-12-09 — End: 2016-12-09

## 2016-12-09 MED ORDER — APIXABAN 5 MG PO TABS
5.0000 mg | ORAL_TABLET | Freq: Two times a day (BID) | ORAL | 0 refills | Status: DC
Start: 2016-12-09 — End: 2017-06-06

## 2016-12-09 MED ORDER — AMIODARONE HCL 200 MG PO TABS
200.0000 mg | ORAL_TABLET | Freq: Two times a day (BID) | ORAL | 0 refills | Status: DC
Start: 2016-12-09 — End: 2017-01-26

## 2016-12-09 MED ORDER — LABETALOL HCL 200 MG PO TABS
200.0000 mg | ORAL_TABLET | Freq: Two times a day (BID) | ORAL | 1 refills | Status: DC
Start: 2016-12-09 — End: 2017-06-06

## 2016-12-09 MED ORDER — HYDROCODONE-ACETAMINOPHEN 5-325 MG PO TABS
2.0000 | ORAL_TABLET | Freq: Four times a day (QID) | ORAL | 0 refills | Status: DC | PRN
Start: 2016-12-09 — End: 2017-01-26

## 2016-12-09 MED ORDER — FUROSEMIDE 40 MG PO TABS
40.0000 mg | ORAL_TABLET | Freq: Two times a day (BID) | ORAL | 0 refills | Status: DC
Start: 2016-12-09 — End: 2016-12-29

## 2016-12-09 MED ORDER — POTASSIUM CHLORIDE CRYS ER 20 MEQ PO TBCR
40.0000 meq | EXTENDED_RELEASE_TABLET | Freq: Every day | ORAL | 0 refills | Status: AC
Start: 2016-12-10 — End: 2016-12-31

## 2016-12-09 MED ORDER — CEFDINIR 300 MG PO CAPS
300.0000 mg | ORAL_CAPSULE | Freq: Two times a day (BID) | ORAL | 0 refills | Status: AC
Start: 2016-12-09 — End: 2016-12-19

## 2016-12-09 NOTE — Progress Notes (Signed)
Cardiology Progress Note      Date Time: 12/09/16 8:41 AM  [name], [name]      Assessment:     Active Hospital Problems    Diagnosis   . Chronic anticoagulation   . Stroke   . CKD (chronic kidney disease)   . Former smoker, stopped smoking many years ago   . Type 2 diabetes mellitus   . HTN (hypertension)   . HLD (hyperlipidemia)   . Diabetic neuropathy   . COPD (chronic obstructive pulmonary disease)   . Bipolar disorder   . BPH (benign prostatic hyperplasia)   . Atrial fibrillation   . Chronic atrial fibrillation   . Stage 4 chronic kidney disease             Plan:     1. Feels good, no complaints. Remains in sinus rhythm.   2. On oral antibiotics.   3. Renal function has normalized.  4. Type B descending aortic dissection - Will need further imaging in 6 weeks, being set up through cardiac surgery.   5. D/C on amiodarone 200mg  daily, Omnicef for one more week, and apixaban.   6. F/U [facility]sel in 4 weeks, cardiac surgery in 4-6 weeks.       Subjective:   He is ready for rehabilitation or home.     Physical Exam:     Temp:  [97 F (36.1 C)-99.5 F (37.5 C)] 97 F (36.1 C)  Heart Rate:  [65-80] 65  Resp Rate:  [16-18] 18  BP: (144-168)/(81-94) 168/94    Wt Readings from Last 3 Encounters:   12/09/16 73.5 kg (162 lb 0.6 oz)   10/21/16 85.5 kg (188 lb 9.6 oz)   10/07/16 84 kg (185 lb 3.2 oz)            Intake/Output Summary (Last 24 hours) at 12/09/16 0841  Last data filed at 12/09/16 0400   Gross per 24 hour   Intake              520 ml   Output             1955 ml   Net            -1435 ml       General appearance - alert, well appearing, and in no distress  Chest -poor air movement  Heart - normal rate and regular rhythm, 1/6 systolic ejection murmur LSM  Extremities - peripheral pulses normal, no pedal edema, no clubbing or cyanosis  Abd: soft. Non-tender, non-distended,  Neuro: alert and oriented  Skin: no rashes  Psych: normal affect    Medications:     Scheduled Meds:   amiodarone  200 mg Oral BID   apixaban 5 mg Oral Q12H SCH   cefdinir 300 mg Oral Q12H SCH   divalproex EC/DR tablet 500 mg Oral Q12H SCH   docusate sodium 100 mg Oral Daily   furosemide 40 mg Intravenous BID   insulin glargine 10 Units Subcutaneous Q12H SCH   labetalol 200 mg Oral Q12H SCH   lactobacillus species 50 Billion CFU Oral Daily   pantoprazole 40 mg Oral BID AC   polyethylene glycol 17 g Oral Daily   potassium chloride 40 mEq Oral Daily   pravastatin 40 mg Oral QPM   senna 8.6 mg Oral QHS   tamsulosin 0.4 mg Oral QD after dinner             Labs:  Recent Labs  Lab 12/09/16  0724 12/09/16  0710 12/08/16  0513 12/07/16  0536   Glucose  --  115*  115* 121*  121* 113*  113*   BUN  --  16  16 19  19 17  17    Creatinine  --  1.13  1.13 1.34*  1.34* 1.20  1.20   Sodium  --  135*  135* 135*  135* 137  137   Potassium  --  3.7  3.7 3.8  3.8 4.3  4.3   Chloride  --  96*  96* 94*  94* 96*  96*   CO2  --  30.9*  30.9* 31.6*  31.6* 32.0*  32.0*   Magnesium 2.1  --   --  2.2   AST (SGOT)  --  27 20 20    ALT  --  44 51 64*     Estimated Creatinine Clearance: 59.3 mL/min (based on SCr of 1.13 mg/dL).      Recent Labs  Lab 11/26/16  0302   CHOL 70*   TRIG 78   HDL 21*   LDL 33         Recent Labs  Lab 12/09/16  0710 12/08/16  0513 12/07/16  1622  12/02/16  1407   WBC 13.3* 14.8* 14.0* More results in Results Review  --    Hemoglobin 8.9* 8.8* 8.5* More results in Results Review  --    Hematocrit 29.2* 28.7* 28.1* More results in Results Review  --    PLT CT 621* 573* 535* More results in Results Review  --    PT INR  --   --   --   --  1.4*   More results in Results Review = values in this interval not displayed.    No results for input(s): TROPI, CK, CKMB in the last 8760 hours.      Recent Labs  Lab 11/26/16  0302   TSH 1.81       Radiology: all results from this admission  Xr Chest 2 Views    Result Date: 11/15/2016  Cardiomegaly with cephalization of pulmonary vascular flow. No focal infiltrate, pleural  effusion, or pneumothorax. ReadingStation:[email or web address]    Xr Abdomen Ap    Result Date: 11/24/2016  Findings worrisome for partial proximal or mid small bowel obstruction. ReadingStation:[email or web address]    Ct Head Wo Contrast    Result Date: 11/24/2016  1. No evidence of intracranial hemorrhage. 2. Probable subacute infarct superior LEFT frontoparietal lobe gyrus. 3. If clinically indicated, MRI may be helpful in further evaluation. ReadingStation:[email or web address]    Ct Chest Wo Contrast    Result Date: 11/28/2016  1.  DESCENDING THORACIC AORTIC DISSECTION. 2.  LARGE LEFT EFFUSION WITH COMPLETE LEFT LOWER LOBE COLLAPSE. 3.  CARDIOMEGALY WITH PERICARDIAL FLUID. 4.  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 ReadingStation:[email or web address]    Xr Chest Ap Only    Result Date: 11/22/2016  No pneumothorax or significant acute pulmonary abnormalities. Small amount of subcutaneous emphysema in the anterior chest. ReadingStation:[email or web address]    Xr Chest Ap Portable    Result Date: 12/09/2016  Persistent left basilar atelectasis/airspace disease with small left effusion. No observed pneumothorax. Right lung is clear. Stable mild cardiac enlargement. Minimal change from Dec 08, 2016. ReadingStation:[email or web address]    Xr Chest Ap Portable    Result Date: 12/08/2016  Slight improved aeration left lower lobe as above. No pneumothorax ReadingStation:[email or web address]    Xr Chest Ap Portable    Result Date:  12/07/2016  1.  NO CHANGE. 2.  PERSISTENT LEFT EFFUSION WITH ASSOCIATED BASILAR AIRSPACE DISEASE. ReadingStation:[email or web address]    Xr Chest Ap Portable    Result Date: 12/06/2016  FINDINGS/IMPRESSION: Persistent left lower lobe consolidation with underlying effusion, which appears partially loculated. No change from prior. ReadingStation:[email or web address]    Xr Chest Ap Portable    Result Date: 12/05/2016  Findings again similar to prior. Stable prominent cardiomegaly. Left-sided pleural effusion. Left retrocardiac air bronchogram. Possible mild CHF/fluid overload.  Stable ectatic/aneurysmal aortic knob. ReadingStation:[email or web address]    Xr Chest Ap Portable    Result Date: 12/04/2016  Findings similar to prior. ReadingStation:[email or web address]    Xr Chest Ap Portable    Result Date: 12/03/2016  No significant change ReadingStation:[email or web address]    Xr Chest Ap Portable    Result Date: 12/02/2016  1.  NO ACUTE CARDIOPULMONARY CHANGE. 2.  NO PNEUMOTHORAX. 3.  LEFT EFFUSION AND ASSOCIATED BASILAR AIRSPACE DISEASE. ReadingStation:[email or web address]    Xr Chest Ap Portable    Result Date: 12/01/2016  1.  NO CHANGE. 2.  LEFT CHEST TUBE WITHOUT PNEUMOTHORAX. 3.  LEFT EFFUSION WITH ASSOCIATED BASILAR AIRSPACE DISEASE. ReadingStation:[email or web address]    Xr Chest Ap Portable    Result Date: 11/30/2016  Stable small left pleural effusion with left basilar atelectasis or infiltrate. ReadingStation:[id number]    Xr Chest Ap Portable    Result Date: 11/29/2016  1.  Left apical chest tube in place without appreciable pneumothorax. 2.  Hazy opacification of the left base likely represents a combination of atelectasis and a small amount of pleural fluid. 3.  Unchanged cardiomegaly. ReadingStation:[email or web address]    Xr Chest Ap Portable    Result Date: 11/28/2016  1.  NO PNEUMOTHORAX AFTER CHEST TUBE PLACEMENT. 2.  LEFT EFFUSION NEARLY COMPLETELY RESOLVED. ReadingStation:[email or web address]    Xr Chest Ap Portable    Result Date: 11/28/2016  Increase left effusion and worsening airspace disease left lower lung. ReadingStation:[email or web address]    Xr Chest Ap Portable    Result Date: 11/26/2016  Stable pulmonary vascular congestion and left basilar atelectasis or infiltrate. Probable small left pleural effusion is unchanged. ReadingStation:[id number]    Xr Chest Ap Portable    Result Date: 11/24/2016  Cardiomegaly with bilateral airspace disease or edema with small left effusion. No observed pneumothorax. Interval removal right IJ central line. ReadingStation:[email or web address]    X-ray Chest Ap Portable    Result Date: 11/23/2016  1. Pulmonary vascular congestion without overt  failure 2. Left basilar opacity likely combination of atelectasis, consolidation and small effusion. 3. Catheter appliances as above ReadingStation:[email or web address]    Xr Abdomen Portable    Result Date: 11/25/2016  No acute abnormality. [place], DO

## 2016-12-09 NOTE — Discharge Summary (Signed)
CARDIOTHORACIC SURGERY DISCHARGE SUMMARY    Date Time: 12/09/16 2:00 PM  [name]  Attending Physician: [name], MD  Primary Care Physician: [name], MD    Date of Admission: 11/22/2016  Date of Discharge: 12/09/2016     Discharge Diagnoses:      Patient Active Problem List    Diagnosis Date Noted   . Chronic anticoagulation 11/25/2016   . Stroke 11/25/2016   . CKD (chronic kidney disease) 11/25/2016   . Former smoker, stopped smoking many years ago 11/25/2016   . Type 2 diabetes mellitus 11/25/2016   . HTN (hypertension) 11/25/2016   . HLD (hyperlipidemia) 11/25/2016   . Diabetic neuropathy 11/25/2016   . COPD (chronic obstructive pulmonary disease) 11/25/2016   . Bipolar disorder 11/25/2016   . BPH (benign prostatic hyperplasia) 11/25/2016   . Atrial fibrillation 11/22/2016   . Atrial fibrillation, persistent 10/21/2016   . Chronic atrial fibrillation 10/08/2016   . Stage 4 chronic kidney disease 10/08/2016   . Persistent atrial fibrillation 10/07/2016   . Fatty liver 10/07/2016       Consultations:     Treatment Team:   Attending Provider: [name], MD  Consulting Physician: [name], DO  Consulting Physician: Ron Agee, MD    Procedures/Radiology performed:   Operation 11/22/16  1- Hybrid left atril ablation using bipolar radiofrequency (convergent procedure)  2- Transesophageal echocardiogram     Hospital Course:   [name] is a 66 year old male with a history of chronic afib, CKD creatine 1.3/1.4, and reduced LVEF 45% preop. After a full preoperative work up, he was taken to the OR for hybrid ablation with [name] and assistant Heywood Bene, and Dr. [name]. Pt was restarted on eliquis POD#2 and developed hypotension, lethargy, elevated creatinine and potassium and was transferred back to ICU. Pt was found on echo to have a large pericardial effusion that was drained via percutaneous pericardiocentesis with Dr. Gery Pray on 4/19. He  did well after this with kidney function improving quickly back to his baseline, today creatinine is 1.13. Pt also converted back to afib. He developed a left sided pleural effusion in the ICU and chest tube was inserted. 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 Right sided weakness improved. He developed a left lower lobe consolidation with leukocytosis and was started on empiric antibiotics for suspected HAP. CXR improving, leukocytosis improving, pt afebrile, and pt started on oral antibiotics.Sub xiphoid wound has healed without signs of erythema, infection. Pt is ambulating and off oxygen. Discharge plans discussed with Dr. [name]. Pt today is being discharged to SNF with instructions for close follow up with [name] and Dr. Redgie Grayer office. We will see him for follow up CT in 6 weeks to monitor Type B dissection.     Discharge Condition:   good    Discharge Medications:        Medication List      START taking these medications    apixaban 5 MG  Commonly known as:  ELIQUIS  Take 1 tablet (5 mg total) by mouth every 12 (twelve) hours.     cefdinir 300 MG capsule  Commonly known as:  OMNICEF  Take 1 capsule (300 mg total) by mouth every 12 (twelve) hours.for 10 days     HYDROcodone-acetaminophen 5-325 MG per tablet  Commonly known as:  NORCO  Take 2 tablets by mouth every 6 (six) hours as needed for Pain.  labetalol 200 MG tablet  Commonly known as:  NORMODYNE  Take 1 tablet (200 mg total) by mouth every 12 (twelve) hours.     potassium chloride 20 MEQ tablet  Commonly known as:  K-DUR,KLOR-CON  Take 2 tablets (40 mEq total) by mouth daily.for 21 days  Start taking on:  12/10/2016        CHANGE how you take these medications    amiodarone 200 MG tablet  Commonly known as:  PACERONE  Take 1 tablet (200 mg total) by mouth 2 (two) times daily.  What changed:  when to take this     furosemide 40 MG tablet  Commonly known as:  LASIX  Take 1 tablet (40 mg total) by mouth  2 (two) times daily.for 21 days  What changed:   when to take this   Another medication with the same name was removed. Continue taking this medication, and follow the directions you see here.     metFORMIN 1000 MG tablet  Commonly known as:  GLUCOPHAGE  Take 0.5 tablets (500 mg total) by mouth 2 (two) times daily with meals.  What changed:  See the new instructions.        CONTINUE taking these medications    allopurinol 300 MG tablet  Commonly known as:  ZYLOPRIM     divalproex EC/DR 500 MG EC tablet  Commonly known as:  DEPAKOTE EC/DR     gabapentin 100 MG capsule  Commonly known as:  NEURONTIN     glimepiride 1 MG tablet  Commonly known as:  AMARYL     multivitamin capsule     pravastatin 40 MG tablet  Commonly known as:  PRAVACHOL     tamsulosin 0.4 MG Caps  Commonly known as:  FLOMAX        STOP taking these medications    atenolol 100 MG tablet  Commonly known as:  TENORMIN     CARTIA XT 240 MG 24 hr capsule  Generic drug:  dilTIAZem     lisinopril 10 MG tablet  Commonly known as:  PRINIVIL,ZESTRIL     loperamide 2 MG capsule  Commonly known as:  IMODIUM     minoxidil 10 MG tablet  Commonly known as:  LONITEN     XARELTO 20 MG Tabs  Generic drug:  rivaroxaban           Where to Get Your Medications      These medications were sent to [facility], [address] AT [place]  [address] [place] [address]    Phone:  [phone]    amiodarone 200 MG tablet   apixaban 5 MG   cefdinir 300 MG capsule   furosemide 40 MG tablet   HYDROcodone-acetaminophen 5-325 MG per tablet   labetalol 200 MG tablet   metFORMIN 1000 MG tablet   potassium chloride 20 MEQ tablet         Disposition:     The patient was instructed to contact physician or return to emergency department with any significant changes or worsening problems.    Follow-up Information     PRIMARY PHYSICIAN Follow up.    Why:  The Physician at your facility will continue to follow with  you           [name], MD Follow up.    Specialties:  Thoracic and Cardiac Surgery, Interventional Cardiology  Why:  June 15th @ 10:30 a.m.  Contact information:  [place] [address]  [phone]             Su Hoff, NP Follow up.    Specialty:  Family Nurse Practitioner  Why:  June 19th @ 11 a.m.   Contact information:  [place] [address]  [phone]             Linden Dolin, PA Follow up on 12/29/2016.    Specialty:  Physician Assistant  Why:  @ 3 p.m. Your appt on May 24th has been canceled.   Contact information:  [address]  [place] [address]  [phone]                   Complete instructions and follow up are in the patient's After Visit Summary    SNF    Minutes spent coordinating discharge and reviewing discharge plan:45 Minutes.    Signed by:   Durene Cal, PA-C  12/09/2016 2:00 PM     CC: [name], MD

## 2016-12-09 NOTE — Discharge Instr - AVS First Page (Signed)
Discharge Instructions for Ablation  You have had a procedure called ablation. It wasused to treat an abnormal heartbeat (arrhythmia). This procedure destroyed (ablated) the cells in your heart that were causing your heart rhythm problem. During the procedure, the healthcare provider put a thin,flexiblewire (catheter)into a blood vessel in your upper thigh. The provider then threaded it up to your heart.  Home care  Here are recommendations for care at home:   You won't be able todrive yourself home because you had medicine to relax you (sedation) You will need to make arrangements for a ride. Your healthcare provider may tell you not todrive for 24 hours after the procedure.   You should be able to go back to your normal daily activities in the next 1 to 2 days. Theseinclude walking, climbing stairs, and doing household chores.   Don't do any heavy physical activity for several days after the procedure. This will allow your body to heal.   Ask your healthcare provider when you canreturn to work.   Take your temperature and check your incision for signs of infection every day for a week. Signs of infection include redness, swelling, drainage, or warmth at the incision site.It is normal to have asmall bruise or lump where the catheter was inserted.   Take your medicines exactly as directed. Don't skip doses. You may need to make some changes in your medicines because of the ablation procedure. Be sure to go over your medicine instructions with your healthcare provider before you are discharged.   Learn to take your own pulse. Keep a record of your results. Ask your healthcare provider which readings mean that you need medical attention.   Don't lift heavy objects for a period of time after your ablation. Ask your healthcare provider for specific advice.  Follow-up care  Make a follow-up appointment as directed by your healthcare provider. Your provider will check how your incision site is  healing. In many cases, one ablation is enough to treat an arrhythmia. But sometimes the problem comes back or another is found. If this happens, you may need a second procedure.  When to call your healthcare provider  Call your healthcare provider right away if you have any of the following:   Redness, pain, swelling, bleeding, or drainage from your incision   Chest pain, shortness of breath, or dizziness   Temperature of 100.62F (38.0C) or higher, or as directed by your healthcare provider   Sudden coldness, pain, or numbness in the leg or arm with the insertion site   Nausea or vomiting  Note: Ask your healthcare provider what to expect about your heartbeat. Sometimes the irregularity goes away right after the procedure. Other times it may take longer to go away.   Date Last Reviewed: 05/10/2015   2000-2017 [employer]. [address] [place] [address]. All rights reserved. This information is not intended as a substitute for professional medical care. Always follow your healthcare professional's instructions.

## 2016-12-09 NOTE — PT Progress Note (Signed)
[facility]: [facility]  Department of Rehabilitation Services: [phone]  [name]    CSN: [id number]    SURG TELEMETRY STEP-DOWN   330/330-A    Physical Therapy Treatment Note    Time of treatment:   Time Calculation  PT Received On: 12/09/16  Start Time: 0959  Stop Time: 9562  Time Calculation (min): 30 min    Visit#: 6    Last seen by Physical therapist vs. PTA: 12/06/2016    Medical Diagnosis/Pertinent medical/surgical details: hybrid convergent atrial ablation procedure on 11/22/16. On 11/25/16, the patient was transferred to the ICU with shortness of breath. The patient had a large pericardial effusion drained on 11/25/16. The patient is also presenting with right sided weakness, particularly on the right upper extremity. 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 Cardioversion performed 12/07/2016    Precautions and Contraindications:  Falls  Mobility protocol   HR 50-120, SBP 90-180, DBP 50-100    Assessment:   Patient's progress towards established goals: Patient able to ambulate 4' with walker and minimal assist. He was also able to participate in supine exercises. Patient again motivated to mobilize, however, did require frequent rest breaks due to fatigue. Goal #1 met at this time.     Patient continues to have the following impairments: decreased strength, decreased safety/judgement during functional mobility, decreased activity tolerance, decreased functional mobility, decreased balance, gait deficits    Patient will continue to benefit from skilled PT services in order to address above impairments to facilitate return to PLOF.      Goals:   To be completed by discharge:  1. The patient will complete all bed mobility with supervision in order to prepare to transfer. MET  2. The patient will complete sit to stand and stand to sit transfers with supervision in order to prepare to ambulate. MET  3. The patient will ambulate  150 feet with supervision with LRAD in order to demonstrate independence with mobility. ONGOING  4. The patient will ascend/descend 7 stairs with 2 rails with supervision in order to demonstrate the ability to get into his home. ONGOING     Plan:   Treatment/interventions: Exercise, Gait training, Stair training, Neuromuscular re-education, Functional transfer training, Patient/caregiver training, Bed mobility    Treatment Frequency: 4-5x/wk    DISCHARGE RECOMMENDATIONS   DME recommended for Discharge:   TBD at next level of care    Discharge Recommendations:   Acute Rehab         Subjective:   "I couldn't do that 5 days ago" re: straight leg raises   Patient is agreeable to participation in the therapy session. Nursing clears patient for therapy.    Pain:  With Activity: 7/10  Location: Back:  Lower  Interventions: Repositioned    OBJECTIVE:   Observation of Patient/Vital Signs:   Patient is in bed with telemetry  Patient's medical condition is appropriate for Physical therapy intervention at this time.    Vital Signs:  BP Supine:  165/98 mmHg  HR Supine: 76 bpm    Oriented to: Person, Place  and Situation  Command following: Follows multi-step commands with increased time  Alertness/Arousal: Delayed responses to stimuli   Attention Span:Appears intact  Memory: Appears intact  Safety Awareness: minimal verbal instruction  Insights: Educated in safety awareness    Musculoskeletal and Balance Details:                Bed Mobility:  Supine to Sit:   Supervision.   HOB elevated, Bed rail used  Sit to Supine:   Supervision.   HOB flat, Bed rail used    Transfers:  Sit to Stand:  Supervision with [place] wheeled walker, Gait belt.    Cues for Hand Placement  Stand to Sit:  Supervision.    Cues for Sequencing, Cues for Hand Placement    Locomotion:  LEVEL AMBULATION:  Distance: 42'   Assistance level:  Minimal assist  Device:  Front wheeled walker, Gait belt  Pattern:  Step through, Decreased cadence, Decreased step length:   bilaterally, Decreased clearance:  bilaterally  Comments: No loss of balance noted. Cues given to encourage patient to increase foot clearance, maintain an upright posture and to look forward for safety.   Distance limited by: fatigue    Participation and Activity Tolerance   Participation effort: Good  Activity Tolerance: Tolerates 30 minutes of activity with rest breaks    Other Treatment Interventions this session:   Therapeutic exercise: Patient required frequent rest breaks while participating in ther-ex    Supine: Ankle pump: Bil 10x   Heel slide: Bil 10x  Straight leg raise: Bil 10x  Hip add/abd slides: Bil 10x  Therapeutic activity  Gait training  Patient/family/caregiver education     Education Provided:   TOPICS: role of physical therapy, plan of care, goals of therapy and HEP, safety with mobility and ADLs, benefits of activity, energy conservation techniques, activity with nursing    Learner educated: Patient  Method: Explanation  Response to education: Verbalized understanding    Patient Position at End of Treatment:   Supine, in bed, Needs in reach, Bed/chair alarm set and No distress    Team Communication:   Spoke to : RN/LPN Maryruth Hancock, CNA  Regarding: Pre-session re: patient status, Patient position at end of session, Patient participation with Therapy, Vital signs, Further recommendations  Whiteboard updated: Yes  PT/PTA communication: via written note and verbal communication as needed.      Recommend patient sit in chair for all meals, walk in hall 3x/day with walker and staff assist of 1 outside of PT sessions.    [name]

## 2016-12-09 NOTE — Progress Notes (Signed)
Discharge Planner Discharge Note  [place]   [place] [address]          12/09/16 1216   CM Review   Expected Discharge Date 12/09/16   CM Comments 12/09/16:  DCP-  Spoke with PA who states patient is ready for d/c today.  Patient has a bed offer at [facility] (preferred) and [facility].  Patient accepted [facility] bed offer.  He is agreeable to private cost of transport.  VMT w/c van arranged for 5 PM.  See notes.          12/09/16 1224   Discharge Disposition   Patient preference/choice provided? Yes   Physical Discharge Disposition SNF   Receiving facility, unit and room number: [facility] report phone number: [phone]   Mode of Transportation Wheelchair Donia Guiles up time VMT w/c Lucianne Lei 5 PM (earliest available, patient agreeable to private cost of transport)   Patient/Family/POA notified of transfer plan Yes   Patient agreeable to discharge plan/expected d/c date? Yes   Bedside nurse notified of transport plan? Yes   Hard copy of narcotic RX sent with patient? Yes  (If ordered)   Hard copy of DNR/Advance Directive sent with patient? No   IV antibiotics post discharge? No   Wound care post discharge? Yes   Medicare Checklist   Is this a Medicare patient? Yes   3 midnight inpatient qualifying stay (SNF only) Yes   Medicaid/DMAS   [place] pre[place] completed? Yes   [place] Capacity form completed? Yes       Alcide Evener, Arita Miss, [facility]  Discharge Planner Social Worker  [phone]

## 2016-12-09 NOTE — Progress Notes (Signed)
Between DAILY PROGRESS NOTE      [name]  Attending Physician: [name], MD    [name] REVIEWED INFORMATION BELOW, DISCUSSED RELEVANT POINTS WITH PATIENTS AND PERSONALLY DEVELOPED PLAN OF CARE.    Assessment/Plan:   POD # 17S/p hybrid afib ablation   Pt doing much better  Short 7 beat run of vtach yesterday, NSR since.   BP 141/79   Pulse 67   Temp 97.9 F (36.6 C) (Oral)   Resp 18   Ht 1.702 m (5\' 7" )   Wt 73.5 kg (162 lb 0.6 oz)   SpO2 94%   BMI 25.38 kg/m       Cardiac: NSR, sternal incision c/d/i  Pulm: HAP will go home on omnicef for 10 days. CXR improving.   Leukocytosis: 13.3 today, improving  ARF- creatinine 1.13 improved. Nephrology following.   Bladder spasms: restarted on flomax, improving per pt    Dispo: Discharge to SNF on omnicef, amiodarone, eliquis. Will follow up in office on type B dissection 6 weeks with CT.     Subjective   No complaints, ready for SNF. Denies SOB, chest pain, palpitations.     Vital Signs:   Vitals:  Temp: 97.9 F (36.6 C)  Heart Rate: 67  BP: 141/79  SpO2: 94 %  O2 Flow Rate (L/min): 2 L/min    Pre-Op Weight: 85.3 kg (188 lb) (11/15/16 1033)  Post-Op Weight: 73.5 kg (162 lb 0.6 oz) (12/09/16 0316)    Recent Labs  Lab 12/09/16  1211 12/09/16  0746 12/09/16  0317 12/08/16  2046 12/08/16  1635   Glucose, POCT 207* 119* 125* 248* 164*         Intake/Output Summary (Last 24 hours) at 12/09/16 1331  Last data filed at 12/09/16 1255   Gross per 24 hour   Intake              520 ml   Output             2205 ml   Net            -1685 ml       Physical Exam:     General: awake, alert, no acute distress, pleasant, comfortable  Neck: supple  Cardiovascular:afib. normal S1,S2 no murmurs, rubs or gallops. Incision c/d/I   Lungs: decreased on left, no adventitious sounds. normal respiratory effort  Abdomen:  normoactive bowel sounds, soft, non-tender, non-distended  Extremities: no clubbing, cyanosis, 1+ LE edema.  Neuro:  Cranial nerves II-XII are grossly intact.  Motor and sensory function of the upper and lower extremities are grossly intact    Meds:     Current Facility-Administered Medications   Medication Dose Route Frequency   . amiodarone  200 mg Oral BID   . apixaban  5 mg Oral Q12H [place]   . cefdinir  300 mg Oral Q12H [place]   . divalproex EC/DR tablet  500 mg Oral Q12H [place]   . docusate sodium  100 mg Oral Daily   . furosemide  40 mg Intravenous BID   . insulin glargine  10 Units Subcutaneous Q12H [facility]   . labetalol  200 mg Oral Q12H [facility]   . lactobacillus species  50 Billion CFU Oral Daily   . pantoprazole  40 mg Oral BID AC   . polyethylene glycol  17 g Oral Daily   . potassium chloride  40 mEq Oral Daily   . pravastatin  40 mg Oral QPM   .  senna  8.6 mg Oral QHS   . tamsulosin  0.4 mg Oral QD after dinner     Current Facility-Administered Medications   Medication Dose Route Frequency Last Rate     Current Facility-Administered Medications   Medication Dose Route   . albuterol  2.5 mg Nebulization   . belladonna-opium  30 mg Rectal   . dextrose  125 mL Intravenous   . glucagon (rDNA)  1 mg Intramuscular   . HYDROcodone-acetaminophen  2 tablet Oral   . insulin aspart  2-24 Units Subcutaneous   . ondansetron  4 mg Oral    Or   . ondansetron  4 mg Intravenous   . promethazine  25 mg Oral    Or   . promethazine  12.5 mg Rectal    Or   . promethazine  6.25 mg Intramuscular   . traMADol  50 mg Oral             Labs:       Recent Labs  Lab 12/09/16  0724 12/09/16  0710 12/08/16  0513 12/07/16  0536 12/06/16  0341 12/05/16  0316   Sodium  --  135*  135* 135*  135* 137  137 136  136 137   Potassium  --  3.7  3.7 3.8  3.8 4.3  4.3 4.2  4.2 4.0   Chloride  --  96*  96* 94*  94* 96*  96* 97*  97* 95*   CO2  --  30.9*  30.9* 31.6*  31.6* 32.0*  32.0* 32.4*  32.4* 37.0*   BUN  --  16  16 19  19 17  17 17  17 20    Creatinine  --  1.13  1.13 1.34*  1.34* 1.20  1.20 1.05  1.05 0.91   EGFR  --  67  67 54*  54* 62  62 73   73 87   Calcium  --  8.9  8.9 8.8  8.8 8.9  8.9 8.8  8.8 8.9   Magnesium 2.1  --   --  2.2  --   --        Recent Labs  Lab 12/09/16  0710 12/08/16  0513 12/07/16  1622   WBC 13.3* 14.8* 14.0*   RBC 3.14* 3.10* 3.01*   Hemoglobin 8.9* 8.8* 8.5*   Hematocrit 29.2* 28.7* 28.1*   MCV 93 93 93   PLT CT 621* 573* 535*       Recent Labs  Lab 12/02/16  1407   PT 14.0*   PT INR 1.4*           Recent Labs  Lab 12/09/16  0710  12/02/16  1407   Bilirubin, Total 0.8 More results in Results Review 1.1   Bilirubin, Direct  --   --  0.6*   Protein, Total 6.5 More results in Results Review 5.7*   Albumin 2.5*  2.5* More results in Results Review 2.5*   ALT 44 More results in Results Review 190*   AST (SGOT) 27 More results in Results Review 18   More results in Results Review = values in this interval not displayed.    Radiology:     Radiology Results (24 Hour)     Procedure Component Value Units Date/Time    XR Chest AP Portable [[id number]] Collected:  12/09/16 0724    Order Status:  Completed Updated:  12/09/16 0726    Narrative:       Clinical History:  Reason For Exam: [place] chest tube. left pleural effusion. post op hybrid ablation and pericardiocentesis    Examination:  Frontal view of the chest.    Comparison:  Dec 08, 2016    Findings:  Persistent left basilar atelectasis/airspace disease with small left effusion. No observed pneumothorax. Right lung is clear. Stable mild cardiac enlargement. Mild degenerative changes of thoracic spine.      Impression:       Persistent left basilar atelectasis/airspace disease with small left effusion. No observed pneumothorax. Right lung is clear. Stable mild cardiac enlargement. Minimal change from Dec 08, 2016.    ReadingStation:[email or web address]          Leanor Kail, PA  Date: Dec 09, 2016  Time: 1:31 PM    Please feel free to contact me or [name] for any further questions or concerns.    [facility]  [phone]

## 2016-12-24 ENCOUNTER — Telehealth: Payer: Self-pay

## 2016-12-24 NOTE — Telephone Encounter (Signed)
pcp ov note put in chart;;;   Labs sent already in epic - 10/07/16

## 2016-12-24 NOTE — Telephone Encounter (Signed)
Fax request sent to PCP for last note and labs. Cardiologist Peri Jefferson participates in [place]: 3.30.18   EPIC- ECHO: 5.1.18

## 2016-12-28 ENCOUNTER — Ambulatory Visit: Payer: Medicare Other

## 2016-12-29 ENCOUNTER — Ambulatory Visit: Payer: Medicare Other | Admitting: Medical

## 2016-12-29 ENCOUNTER — Ambulatory Visit: Admission: RE | Admit: 2016-12-29 | Payer: Medicare Other | Source: Ambulatory Visit

## 2016-12-29 ENCOUNTER — Encounter: Payer: Self-pay | Admitting: Medical

## 2016-12-29 VITALS — BP 170/110 | HR 76 | Ht 67.0 in | Wt 147.2 lb

## 2016-12-29 DIAGNOSIS — I480 Paroxysmal atrial fibrillation: Secondary | ICD-10-CM

## 2016-12-29 NOTE — Progress Notes (Signed)
Cardiology Follow-Up      [name]   Date of Birth: 1951-01-15    Provider: Linden Dolin, PA     Patient Care Team:  [name], MD as PCP - General (Family Medicine)  [name], DO as Consulting Physician (Cardiology)  [name], MD as Consulting Physician (Thoracic and Cardiac Surgery)    Chief Complaint: Atrial Fibrillation (HFU )      History of Present Illness   [name] is a 66 y.o. male being seen today for Atrial Fibrillation (HFU )    He is here for hospital f/u.  He had convergent AF ablation 11/22/16.   Procedure was complicated with pericardial effusion/ tamponade requiring pericardiocentesis. He developed left pleural effusion with thoracentesis.   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 Right sided weakness improved. He developed a left lower lobe consolidation with leukocytosis and was started on empiric antibiotics for suspected HAP. Suffered CVA.      He was discharged May 3rd to [facility] rehab.  He was there for about 2 weeks and finally got back home on May 19th.    No recurrent AF since [employer].   He continues to recover.  Still having some trouble with muscle tone and fine motor skills.  He has PT and OT  Scheduled Thursdays and Fridays.    He has f/u CT scheduled 5/30 to evaluate dissection.      Review of Systems     Comprehensive review of systems performed.Other than noted in HPI , all systems are negative.    Past Medical History     Past Medical History 12/29/16 LB-     1.Atrial fibrillation, new onset, 10/21/11, cardioverted and placed on sotalol.   a. Cardioversion 10/22/11.    b. Echo 10/22/11- moderate-severe concentric LVH.Biatrial enlargement.Mild AS, dilated inferior vena cava, small pericardial effusion.Peak velocity across the AV at 2.2 meters/s.   c. Cardiac abltation 01/13/15.    d. MRI Cardiac 03/16/16- LVEF 68.4%. Moderate septal wall hypertrophy (1.6cm) and mild  posterior wall hypertrophy (1.3cm). Focal area of mid-myocardial wall delayed enhancement in basal anteroseptal wall and RV insertion point consistent with hypertensive heart disease. RVEF 41.7%. Severe LA enlargement and moderate RA enlargement. 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 Mild AS with peak velocity 2.21m/s. Mild central AR. Mild MR and TR. Small to moderate pericardial effusion. Mildly dilated ascending aorta. Aortic arch and descending thoracic aorta are ectatic. Trivial right pleural effusion. Dilated central pulm artery at 4.3cm.    e. s/p TEE and [place] cardioversion 06/04/16       f. Echo 12/03/16- No pericardial effusion, aortic valve is severely calcified with severely restricted systolic opening, mild regurgitation, LV is normal in size with moderate to severe concentric hypertrophy, systolic function is preserved with an EF 37-04%, diastolic dysfunction that couldn't be graded.     2.Hypertension.  3. Hyperlipidemia.   4.Type I Diabetes Mellitus.  5.Bipolar disorder.  6.Fatty liver.  7.Renal insufficiency.   a. Normal renal ultrasound October 23, 2011.  8.  Type B aortic dissection  9. CVA    Past Surgical History      has a past surgical history that includes Appendectomy; Hernia repair; TONSILLECTOMY, ADENOIDECTOMY; CARDIAC ABLATION; Cardioversion; TEE; Cardiac catheterization; Tonsillectomy; Pericardiocentesis (Left, 11/25/2016); and FLUORO-NO CHARGE (HYBRID ROOM ONLY) (N/A, 11/22/2016).      Family History     family history  includes Diabetes in his brother, mother, and sister; Heart disease in his brother and mother; Hyperlipidemia in his brother, brother, and sister; Hypertension in his brother, brother, daughter, mother, and sister; Kidney disease in his brother.        Social History     Social History   Substance Use Topics   . Smoking status: Former Smoker     Packs/day: 0.30     Years: 40.00     Types: Cigarettes      Quit date: 1995   . Smokeless tobacco: Never Used      Comment: 1 pack would last a week   . Alcohol use No       Allergies     Allergies   Allergen Reactions   . Codeine Nausea And Vomiting       Medications       Current Outpatient Prescriptions:   .  allopurinol (ZYLOPRIM) 300 MG tablet, Take 300 mg by mouth daily., Disp: , Rfl:   .  amiodarone (PACERONE) 200 MG tablet, Take 1 tablet (200 mg total) by mouth 2 (two) times daily., Disp: 60 tablet, Rfl: 0  .  apixaban (ELIQUIS) 5 MG, Take 1 tablet (5 mg total) by mouth every 12 (twelve) hours., Disp: 60 tablet, Rfl: 0  .  divalproex EC/DR (DEPAKOTE EC/DR) 500 MG EC tablet, Take 500 mg by mouth 2 (two) times daily., Disp: , Rfl:   .  furosemide (LASIX) 40 MG tablet, Take 1 tablet (40 mg total) by mouth 2 (two) times daily.for 21 days, Disp: 42 tablet, Rfl: 0  .  gabapentin (NEURONTIN) 100 MG capsule, Take 100 mg by mouth 2 (two) times daily., Disp: , Rfl:   .  glimepiride (AMARYL) 1 MG tablet, Take 0.5 mg by mouth every morning before breakfast.  , Disp: , Rfl:   .  HYDROcodone-acetaminophen (NORCO) 5-325 MG per tablet, Take 2 tablets by mouth every 6 (six) hours as needed for Pain., Disp: 20 tablet, Rfl: 0  .  labetalol (NORMODYNE) 200 MG tablet, Take 1 tablet (200 mg total) by mouth every 12 (twelve) hours., Disp: 60 tablet, Rfl: 1  .  metFORMIN (GLUCOPHAGE) 1000 MG tablet, Take 0.5 tablets (500 mg total) by mouth 2 (two) times daily with meals., Disp: 60 tablet, Rfl: 0  .  Multiple Vitamin (MULTIVITAMIN) capsule, Take 1 capsule by mouth daily., Disp: , Rfl:   .  potassium chloride (K-DUR,KLOR-CON) 20 MEQ tablet, Take 2 tablets (40 mEq total) by mouth daily.for 21 days, Disp: 42 tablet, Rfl: 0  .  pravastatin (PRAVACHOL) 40 MG tablet, Take 40 mg by mouth every evening., Disp: , Rfl:   .  tamsulosin (FLOMAX) 0.4 MG Cap, Take 0.4 mg by mouth daily., Disp: , Rfl:     Physical Exam     Visit Vitals  Ht 1.702 m (5\' 7" )   Wt 66.8 kg (147 lb 3.2 oz)   BMI 23.05 kg/m      Wt Readings from Last 3 Encounters:   12/29/16 66.8 kg (147 lb 3.2 oz)   12/09/16 73.5 kg (162 lb 0.6 oz)   10/21/16 85.5 kg (188 lb 9.6 oz)        General appearance - alert, well appearing, and in no distress  Neck - supple, no significant adenopathy  Chest -clear to auscultation, no wheezes, rales or rhonchi, symmetric air entry  Heart - normal rate, regular rhythm, normal S1, S2, no murmurs, rubs, clicks or gallops  Extremities - no  pedal edema noted  Neuro: alert   Skin: no rashes  Psych: normal affect    Labs     Lab Results   Component Value Date/Time    WBC 13.3 (H) 12/09/2016 07:10 AM    RBC 3.14 (L) 12/09/2016 07:10 AM    HGB 8.9 (L) 12/09/2016 07:10 AM    HCT 29.2 (L) 12/09/2016 07:10 AM    PLT 621 (H) 12/09/2016 07:10 AM    TSH 1.81 11/26/2016 03:02 AM       Lab Results   Component Value Date/Time    NA 135 (L) 12/09/2016 07:10 AM    NA 135 (L) 12/09/2016 07:10 AM    K 3.7 12/09/2016 07:10 AM    K 3.7 12/09/2016 07:10 AM    CL 96 (L) 12/09/2016 07:10 AM    CL 96 (L) 12/09/2016 07:10 AM    CO2 30.9 (H) 12/09/2016 07:10 AM    CO2 30.9 (H) 12/09/2016 07:10 AM    GLU 115 (H) 12/09/2016 07:10 AM    GLU 115 (H) 12/09/2016 07:10 AM    BUN 16 12/09/2016 07:10 AM    BUN 16 12/09/2016 07:10 AM    CREAT 1.13 12/09/2016 07:10 AM    CREAT 1.13 12/09/2016 07:10 AM    PROT 6.5 12/09/2016 07:10 AM    ALKPHOS 117 12/09/2016 07:10 AM    AST 27 12/09/2016 07:10 AM    ALT 44 12/09/2016 07:10 AM       Lab Results   Component Value Date/Time    CHOL 70 (L) 11/26/2016 03:02 AM    TRIG 78 11/26/2016 03:02 AM    HDL 21 (L) 11/26/2016 03:02 AM    LDL 33 11/26/2016 03:02 AM       Lab Results   Component Value Date/Time    HGBA1CPERCNT 6.5 11/26/2016 03:02 AM       Lab Results   Component Value Date/Time    BNP 1,041.5 (H) 11/15/2016 11:10 AM         EKG:   (personally reviewed by myself):  SR    Impression and Recommendations:       1.  Atrial fibrillation s/p convergent AF ablation.  He is maintaining SR. Prolonged  hospitalization following procedure with complication of pericardial tamponade and CVA - he continues with PT/OT and is recovering well.  He feels much better in SR. Will reduce amiodarone to 200 mg daily. Cont OAC.  2. subacute Type B aortic dissection: f/u CT ordered.    F/u 3 months.    Linden Dolin, PA  12/29/2016

## 2016-12-30 ENCOUNTER — Encounter: Payer: Medicare Other | Admitting: Internal Medicine

## 2017-01-05 ENCOUNTER — Other Ambulatory Visit: Payer: Self-pay

## 2017-01-05 ENCOUNTER — Ambulatory Visit
Admission: RE | Admit: 2017-01-05 | Discharge: 2017-01-05 | Disposition: A | Discharge status: Home / Self Care | Payer: Medicare Other | Source: Ambulatory Visit

## 2017-01-05 DIAGNOSIS — R9430 Abnormal result of cardiovascular function study, unspecified: Secondary | ICD-10-CM | POA: Insufficient documentation

## 2017-01-05 DIAGNOSIS — I482 Chronic atrial fibrillation, unspecified: Secondary | ICD-10-CM

## 2017-01-05 DIAGNOSIS — R931 Abnormal findings on diagnostic imaging of heart and coronary circulation: Secondary | ICD-10-CM

## 2017-01-05 MED ORDER — IOHEXOL 350 MG/ML IV SOLN
100.0000 mL | Freq: Once | INTRAVENOUS | Status: AC | PRN
Start: 2017-01-05 — End: 2017-01-05
  Administered 2017-01-05: 16:00:00 100 mL via INTRAVENOUS
  Filled 2017-01-05: qty 100

## 2017-01-21 ENCOUNTER — Encounter (INDEPENDENT_AMBULATORY_CARE_PROVIDER_SITE_OTHER): Payer: Medicare Other | Admitting: Nurse Practitioner

## 2017-01-25 ENCOUNTER — Ambulatory Visit: Payer: Medicare Other

## 2017-01-26 ENCOUNTER — Telehealth: Payer: Self-pay | Admitting: Internal Medicine

## 2017-01-26 ENCOUNTER — Ambulatory Visit: Payer: Medicare Other | Attending: Specialist | Admitting: Specialist

## 2017-01-26 ENCOUNTER — Encounter: Payer: Self-pay | Admitting: Specialist

## 2017-01-26 VITALS — BP 134/70 | HR 96 | Temp 98.1°F | Wt 155.2 lb

## 2017-01-26 DIAGNOSIS — I4891 Unspecified atrial fibrillation: Secondary | ICD-10-CM

## 2017-01-26 DIAGNOSIS — I71019 Dissection of thoracic aorta, unspecified: Secondary | ICD-10-CM | POA: Insufficient documentation

## 2017-01-26 DIAGNOSIS — Z48812 Encounter for surgical aftercare following surgery on the circulatory system: Secondary | ICD-10-CM

## 2017-01-26 DIAGNOSIS — I7101 Dissection of thoracic aorta: Secondary | ICD-10-CM

## 2017-01-26 DIAGNOSIS — Z09 Encounter for follow-up examination after completed treatment for conditions other than malignant neoplasm: Secondary | ICD-10-CM | POA: Insufficient documentation

## 2017-01-26 NOTE — Telephone Encounter (Signed)
Patient was in for a financial aid packet, I explained that anything before May would be our office and anything after May 1st would be [facility]. Patient is needing a packet from before May, [name] is going to send a packet to his address on file.

## 2017-01-26 NOTE — Progress Notes (Signed)
PROGRESS NOTE    Date Time: 01/26/2017 8:17 AM  [name]  Cardiologist: No att. providers found  Primary Care Physician: [name], MD      History of Presenting Illness:   [name] is a 66 y.o. male who presents to the office with   Chief Complaint   Patient presents with   . Follow-up     hybrid atrial fibr ablation   cc: s/p hybrid afib ablation    PATIENT SEEN JOINTLY WITH [name]    [name] REVIEWED INFORMATION BELOW, DISCUSSED RELEVANT POINTS WITH PATIENTS AND PERSONALLY DEVELOPED PLAN OF CARE.       [name] for follow-up after undergoing hybrid atrial fibrillation ablation by [name] and Dr. [name] on 11/22/16. 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 CT at the time showed somewhat calcified dissection flap and was thought to not be an acute event. Patient presents today following recent CTA chest for reassessment. [name] reviewed CT chest. Patient has continued type B dissection with areas of contrast opacification within the dissection flap.     Patient denies new developments of chest pain, shortness breath. Patient reports he is doing well in general. He is trying to regain his muscle strength.  Patient reports he has been in a regular heart rhythm. Last EKG in EPIC system from May, shows sinus rhythm.    Past Medical History:     Past Medical History:   Diagnosis Date   . Arrhythmia    . Arthritis    . Atrial fibrillation    . Atrial flutter    . Bipolar affective    . BPH (benign prostatic hyperplasia)    . Complication of anesthesia     resp. asessment   . Diabetes mellitus    . Diabetic neuropathy    . Gout    . Heart murmur    . HOH (hard of hearing)    . Hyperlipidemia    . Hypertension    . Paroxysmal atrial fibrillation    . Pulmonary hypertension    . Renal insufficiency    . Type 2 diabetes mellitus, controlled    .  Wears glasses        Past Surgical History:     Past Surgical History:   Procedure Laterality Date   . APPENDECTOMY     . CARDIAC ABLATION     . CARDIAC CATHETERIZATION     . CARDIOVERSION      x 2   . FLUORO-NO CHARGE (HYBRID ROOM ONLY) N/A 11/22/2016    Procedure: FLUORO-NO CHARGE (HYBRID ROOM ONLY);  Surgeon: [name], MD;  Location[facility] CATH/EP;  Service: Cardiovascular;  Laterality: N/A;   . HERNIA REPAIR     . PERICARDIOCENTESIS Left 11/25/2016    Procedure: PERICARDIOCENTESIS;  Surgeon: [name], MD;  Location[facility] CATH/EP;  Service: Cardiovascular;  Laterality: Left;   . TEE     . TONSILLECTOMY     . TONSILLECTOMY, ADENOIDECTOMY         Family History:     Family History   Problem Relation Age of Onset   . Hypertension Mother    . Diabetes Mother    . Heart disease Mother    . Hypertension Sister    . Diabetes Sister    . Hyperlipidemia Sister    . Hypertension Brother    . Heart disease Brother    .  Hyperlipidemia Brother    . Hypertension Brother    . Diabetes Brother    . Hyperlipidemia Brother    . Kidney disease Brother    . Hypertension Daughter        Social History:     History   Smoking Status   . Former Smoker   . Packs/day: 0.30   . Years: 40.00   . Types: Cigarettes   . Quit date: 1995   Smokeless Tobacco   . Never Used     Comment: 1 pack would last a week     History   Alcohol Use No     History   Drug Use No       Allergies:     Allergies   Allergen Reactions   . Codeine Nausea And Vomiting       Medications:     Current Outpatient Prescriptions   Medication Sig Dispense Refill   . allopurinol (ZYLOPRIM) 300 MG tablet Take 300 mg by mouth daily.     Marland Kitchen apixaban (ELIQUIS) 5 MG Take 1 tablet (5 mg total) by mouth every 12 (twelve) hours. 60 tablet 0   . atenolol (TENORMIN) 100 MG tablet Take 100 mg by mouth 2 (two) times daily.     . divalproex EC/DR (DEPAKOTE EC/DR) 500 MG EC tablet Take 500 mg by mouth 2 (two) times daily.     . furosemide (LASIX) 40 MG tablet Take 40 mg by mouth  2 (two) times daily.     Marland Kitchen gabapentin (NEURONTIN) 100 MG capsule Take 100 mg by mouth 2 (two) times daily.     Marland Kitchen glimepiride (AMARYL) 1 MG tablet Take 0.5 mg by mouth every morning before breakfast.         . labetalol (NORMODYNE) 200 MG tablet Take 1 tablet (200 mg total) by mouth every 12 (twelve) hours. 60 tablet 1   . Loperamide HCl (IMODIUM PO) Take by mouth daily as needed.     . metFORMIN (GLUCOPHAGE) 1000 MG tablet Take 0.5 tablets (500 mg total) by mouth 2 (two) times daily with meals. 60 tablet 0   . Multiple Vitamin (MULTIVITAMIN) capsule Take 1 capsule by mouth daily.     . pravastatin (PRAVACHOL) 40 MG tablet Take 40 mg by mouth every evening.     . SENNA CO by Combination route daily as needed.     . tamsulosin (FLOMAX) 0.4 MG Cap Take 0.4 mg by mouth daily.       No current facility-administered medications for this visit.         Review of Systems:   Review of Systems   Constitutional: Negative for fever, chills,  unexpected weight change.   HENT: Negative for trouble swallowing and voice change.    Respiratory: Negative for increasing cough, shortness of breath or wheezing.    Cardiovascular: Negative for chest pain, irregular heartbeat or palpitations.   Chest: Negative for sternal click/pop.  Gastrointestinal: Negative for nausea, vomiting, abdominal pain, diarrhea, constipation, or abdominal distention.   Genitourinary: Negative for dysuria, hematuria.  Skin: Negative for incisional redness or drainage.   Extremities: Negative for edema.       Physical Exam:     Vitals:    01/26/17 0810   BP: 134/70   Pulse: 96   Temp: 98.1 F (36.7 C)   SpO2: 98%     Wt Readings from Last 3 Encounters:   01/26/17 70.4 kg (155 lb 3.2 oz)   12/29/16 66.8 kg (  147 lb 3.2 oz)   12/09/16 73.5 kg (162 lb 0.6 oz)     Body mass index is 24.31 kg/m.     General: no acute distress.  Pleasant affect and actively participates in conversation regarding his care.  Neuro: awake, alert, oriented x 3.  Bilaterally equal, strong  hand grips.  HEENT: eomi, sclera anicteric.  CHEST: subxiphoid incision well healed.  Cardiovascular: regular rate and rhythm, soft flow murmur  Lungs:  Clear to auscultation bilaterally without significant diminishment  Abdomen: non-distended  Extremities: no edema.   Skin: pink, warm and dry      Assessment/Plan:     Patient Active Problem List    Diagnosis Date Noted   . Aftercare following surgery of the circulatory system 01/26/2017   . Chronic anticoagulation 11/25/2016   . Stroke 11/25/2016   . CKD (chronic kidney disease) 11/25/2016   . Former smoker, stopped smoking many years ago 11/25/2016   . Type 2 diabetes mellitus 11/25/2016   . HTN (hypertension) 11/25/2016   . HLD (hyperlipidemia) 11/25/2016   . Diabetic neuropathy 11/25/2016   . COPD (chronic obstructive pulmonary disease) 11/25/2016   . Bipolar disorder 11/25/2016   . BPH (benign prostatic hyperplasia) 11/25/2016   . Atrial fibrillation 11/22/2016     Date:11/22/2016    Pre-operative Diagnosis:  1- long-standing persistent atrial fibrillation  2- CKD  3- Reduced LVEF    Post-operative Diagnosis:same    Operation:   1- Hybrid left atrial ablation using bipolar radiofrequency (convergent procedure)  2- Transesophageal echocardiogram    Surgeon: [name], MD     . Atrial fibrillation, persistent 10/21/2016   . Chronic atrial fibrillation 10/08/2016     Added automatically from request for surgery [id number]     . Stage 4 chronic kidney disease 10/08/2016     Added automatically from request for surgery [id number]     . Persistent atrial fibrillation 10/07/2016   . Fatty liver 10/07/2016       1. Aftercare following surgery of the circulatory system    2. Atrial fibrillation, s/p hybrid ablation- - stable, 2 months postoperative. Patient appears to be recovering well.  He is in good spirits and very pleasant today. No acute surgical issues identified today. All incisions healed without evidence of infection. Vital signs stable other than HR  running on the higher side of normal.  Patient reports he has appointment with cardiology later today. CTA chest shows no interval adverse developments of his type B aortic dissection. Patient should continue with good blood pressure management with systolic blood pressure goal 130 or under. [name] would like him to return to clinic in 6 months with repeat CTA chest. No changes made to his medications- defer BP and heart rate management to cardiology. Patient verbalizes understanding of testing results, plan of care. Opportunity given for questions and answers.      A copy of today's progress note will go to Dr. [name], Dr. [name]. Please do not hesitate to contact [name] or me with any questions or concerns. Office number [phone].        The orders placed during this visit, if any:  No orders of the defined types were placed in this encounter.       The patient was instructed as follows:  There are no Patient Instructions on file for this visit.       No Follow-up on file.       Signed by: Beverly Gust, NP  This documentation was completed utilizing [employer].  Grammatical errors, random word insertions, pronoun errors and incomplete sentences are occasional consequences of this technology due to software limitations.  Not all errors are caught or corrected. Any questions or concerns about the content of this note or information contained within the body of this dictation should be addressed directly with the author for clarification.

## 2017-05-05 ENCOUNTER — Encounter: Payer: Self-pay | Admitting: Internal Medicine

## 2017-05-19 ENCOUNTER — Encounter: Payer: Medicare Other | Admitting: Internal Medicine

## 2017-05-31 ENCOUNTER — Telehealth: Payer: Self-pay

## 2017-05-31 NOTE — Telephone Encounter (Signed)
Faxed request to pcp for ov, labs

## 2017-06-02 NOTE — Telephone Encounter (Signed)
2nd req. refaxed pcp for lov//bw

## 2017-06-06 ENCOUNTER — Ambulatory Visit: Payer: Medicare Other | Admitting: Internal Medicine

## 2017-06-06 VITALS — BP 140/78 | HR 68 | Ht 67.0 in | Wt 158.4 lb

## 2017-06-06 DIAGNOSIS — I480 Paroxysmal atrial fibrillation: Secondary | ICD-10-CM

## 2017-06-06 NOTE — Progress Notes (Signed)
Cardiology Follow-Up      [name]   Date of Birth: [birth year]    Provider: [name], DO     Patient Care Team:  [name] Delphia Grates, MD as PCP - General (Family Medicine)  [name], DO as Consulting Physician (Cardiology)  [name], MD as Consulting Physician (Thoracic and Cardiac Surgery)    Chief Complaint: Atrial Fibrillation (F/U post convergent procedure, continues with weakness and SOB, new occasional shakiness )      History of Present Illness   [name] is a 66 y.o. male being seen today for atrial fibrillation.     He feels well, his main complaint is that of fatigue and weakness, as well as shakiness.     He remains on rivaroxaban.     He denies chest pain, no shortness of breath.     His echocardiograms have demonstrated aortic sclerosis in the past. He does have evidence of bicuspid aortic valve on some studies, on other studies this is not mentioned. The severity of his AS is variable depending on the study. For example, echocardiogram in April 2018 demonstrated EF of 65%, moderate bi-atrial enlargement, mod. AS and mild to moderate AI. TEE in May demonstrated mild sclerosis of the aortic valve. Echocardiogram in 11/2016 demonstrated severely calcific aortic valve.     Review of Systems   Constitution: Negative for activity change, appetite change, fatigue, fever, and unexpected weight change  Respiratory: Negative for apnea and/or awakening with sob, chest tightness, and sob  Cardiovascular: Negative for chest pain/discomfort, leg or ankle swelling, difficulty breathing lying down, and palpitations  Gastrointestinal: Negative for abdominal pain, blood in stool, nausea, and vomiting  Endocrine: Negative for cold intolerance, heat intolerance, and polydipsia  GU: Negative for flank pain, frequent urination, and hematuria  Musculoskeletal: Negative for gait problems, joint swelling, neck pain, and pain or heaviness in legs  Psychiatric:  Negative for anxiety, and sleep disturbance    Comprehensive review of systems performed by me. Unless otherwise noted, all systems are negative.    Past Medical History     Past Medical History 12/29/16 LB-     1.Atrial fibrillation, new onset, 10/21/11, cardioverted and placed on sotalol.   a. Cardioversion 10/22/11.    b. Echo 10/22/11- moderate-severe concentric LVH.Biatrial enlargement.Mild AS, dilated inferior vena cava, small pericardial effusion.Peak velocity across the AV at 2.2 meters/s.   c. Cardiac abltation 01/13/15.    d. MRI Cardiac 03/16/16- LVEF 68.4%. Moderate septal wall hypertrophy (1.6cm) and mild posterior wall hypertrophy (1.3cm). Focal area of mid-myocardial wall delayed enhancement in basal anteroseptal wall and RV insertion point consistent with hypertensive heart disease. RVEF 41.7%. Severe LA enlargement and moderate RA enlargement. 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 Mild AS with peak velocity 2.67m/s. Mild central AR. Mild MR and TR. Small to moderate pericardial effusion. Mildly dilated ascending aorta. Aortic arch and descending thoracic aorta are ectatic. Trivial right pleural effusion. Dilated central pulm artery at 4.3cm.    e. s/p TEE and [place] cardioversion 06/04/16  F. Convergent AF Ablation 11/22/16 - Procedure was complicated with pericardial effusion/ tamponade requiring pericardiocentesis. He developed left pleural effusion with thoracentesis.   g. Echo 12/03/16- No pericardial effusion, aortic valve is severely calcified with severely restricted systolic opening, mild regurgitation, LV is normal in size with moderate to severe concentric hypertrophy, systolic function is preserved with an EF 06-30%, diastolic dysfunction that couldn't be graded.  2.Hypertension.  3. Hyperlipidemia.   4.Type I Diabetes Mellitus.  5.Bipolar disorder.  6.Fatty liver.  7.Renal insufficiency.   a.  Normal renal ultrasound October 23, 2011.    Past Surgical History     Past Surgical History:   Procedure Laterality Date   . APPENDECTOMY     . CARDIAC ABLATION     . CARDIAC CATHETERIZATION     . CARDIOVERSION      x 2   . FLUORO-NO CHARGE (HYBRID ROOM ONLY) N/A 11/22/2016    Procedure: FLUORO-NO CHARGE (HYBRID ROOM ONLY);  Surgeon: [name], MD;  Location: [facility] CATH/EP;  Service: Cardiovascular;  Laterality: N/A;   . HERNIA REPAIR     . PERICARDIOCENTESIS Left 11/25/2016    Procedure: PERICARDIOCENTESIS;  Surgeon: [name], MD;  Location: [facility] CATH/EP;  Service: Cardiovascular;  Laterality: Left;   . TEE     . TONSILLECTOMY     . TONSILLECTOMY, ADENOIDECTOMY         Family History     Family History   Problem Relation Age of Onset   . Hypertension Mother    . Diabetes Mother    . Heart disease Mother    . Hypertension Sister    . Diabetes Sister    . Hyperlipidemia Sister    . Hypertension Brother    . Heart disease Brother    . Hyperlipidemia Brother    . Hypertension Brother    . Diabetes Brother    . Hyperlipidemia Brother    . Kidney disease Brother    . Hypertension Daughter        Social History     Social History   Substance Use Topics   . Smoking status: Former Smoker     Packs/day: 0.30     Years: 40.00     Types: Cigarettes     Quit date: 1995   . Smokeless tobacco: Never Used      Comment: 1 pack would last a week   . Alcohol use No       Allergies     Allergies   Allergen Reactions   . Codeine Nausea And Vomiting       Medications     Current Outpatient Prescriptions   Medication Sig   . allopurinol (ZYLOPRIM) 300 MG tablet Take 300 mg by mouth daily.   Marland Kitchen atenolol (TENORMIN) 100 MG tablet Take 100 mg by mouth 2 (two) times daily.   . dilTIAZem (CARDIZEM CD) 240 MG 24 hr capsule Take 240 mg by mouth daily.   . divalproex EC/DR tablet (DEPAKOTE EC/DR) 250 MG EC tablet Take 500 mg by mouth 2 (two) times daily.   . furosemide (LASIX) 40 MG tablet Take 40 mg by mouth daily.       Marland Kitchen gabapentin  (NEURONTIN) 100 MG capsule Take 100 mg by mouth 2 (two) times daily.   Marland Kitchen glimepiride (AMARYL) 1 MG tablet Take 0.5 mg by mouth every morning before breakfast.       . Loperamide HCl (IMODIUM PO) Take by mouth daily as needed.   . metFORMIN (GLUCOPHAGE) 1000 MG tablet Take 0.5 tablets (500 mg total) by mouth 2 (two) times daily with meals. (Patient taking differently: Take 1,000 mg by mouth 2 (two) times daily with meals.    )   . minoxidil (LONITEN) 10 MG tablet Take 10 mg by mouth daily.   . Multiple Vitamin (MULTIVITAMIN) capsule Take 1 capsule by mouth daily.   Marland Kitchen  pravastatin (PRAVACHOL) 40 MG tablet Take 40 mg by mouth every evening.   . rivaroxaban (XARELTO) 20 MG Tab Take 20 mg by mouth daily with dinner.   . SENNA CO by Combination route daily as needed.   . tamsulosin (FLOMAX) 0.4 MG Cap Take 0.4 mg by mouth daily.       Physical Exam     Visit Vitals  BP 140/78 (BP Site: Right arm, Patient Position: Sitting, Cuff Size: Medium)   Pulse 68   Ht 1.702 m (5\' 7" )   Wt 71.8 kg (158 lb 6.4 oz)   BMI 24.81 kg/m     Vitals:    06/06/17 1619   BP: 140/78   BP Site: Right arm   Patient Position: Sitting   Cuff Size: Medium   Pulse: 68   Weight: 71.8 kg (158 lb 6.4 oz)   Height: 1.702 m (5\' 7" )     Wt Readings from Last 3 Encounters:   06/06/17 71.8 kg (158 lb 6.4 oz)   01/26/17 70.4 kg (155 lb 3.2 oz)   12/29/16 66.8 kg (147 lb 3.2 oz)        Constitutional -  Well appearing, and in no distress  Respiratory - Clear to auscultation bilaterally, normal respiratory effort    Cardiovascular system -    Regular rate and rhythm    Normal S1, S2    No murmurs, rubs, gallops   Jugular venous pulse is normal        Carotid upstroke normal, no carotid bruits auscultated   2+ pulses in the posterior tibial / dorsalis pedis bilaterally    Neurological - Alert, oriented, no focal neurological deficits  Extremities -  No clubbing or cyanosis. No peripheral edema  Skin - Warm and dry, no rashes  Psych- Appropriate affect    Labs      Lab Results   Component Value Date/Time    WBC 13.3 (H) 12/09/2016 07:10 AM    RBC 3.14 (L) 12/09/2016 07:10 AM    HGB 8.9 (L) 12/09/2016 07:10 AM    HCT 29.2 (L) 12/09/2016 07:10 AM    PLT 621 (H) 12/09/2016 07:10 AM    TSH 1.81 11/26/2016 03:02 AM       Lab Results   Component Value Date/Time    NA 135 (L) 12/09/2016 07:10 AM    NA 135 (L) 12/09/2016 07:10 AM    K 3.7 12/09/2016 07:10 AM    K 3.7 12/09/2016 07:10 AM    CL 96 (L) 12/09/2016 07:10 AM    CL 96 (L) 12/09/2016 07:10 AM    CO2 30.9 (H) 12/09/2016 07:10 AM    CO2 30.9 (H) 12/09/2016 07:10 AM    GLU 115 (H) 12/09/2016 07:10 AM    GLU 115 (H) 12/09/2016 07:10 AM    BUN 16 12/09/2016 07:10 AM    BUN 16 12/09/2016 07:10 AM    CREAT 1.13 12/09/2016 07:10 AM    CREAT 1.13 12/09/2016 07:10 AM    PROT 6.5 12/09/2016 07:10 AM    ALKPHOS 117 12/09/2016 07:10 AM    AST 27 12/09/2016 07:10 AM    ALT 44 12/09/2016 07:10 AM       Lab Results   Component Value Date/Time    CHOL 70 (L) 11/26/2016 03:02 AM    TRIG 78 11/26/2016 03:02 AM    HDL 21 (L) 11/26/2016 03:02 AM    LDL 33 11/26/2016 03:02 AM       Lab Results  Component Value Date/Time    HGBA1CPERCNT 6.5 11/26/2016 03:02 AM       Cardiogenics:     EKG: Sinus rhythm, normal PR.     Impression and Recommendations:     1. Paroxysmal atrial fibrillation        1. He has maintained sinus rhythm since the time of his CONVERGENT AF ablation. He is doing well overall, continuing to recover from his perioperative stroke, and pericardial effusion. We will continue to follow on a 6 month basis, will need to continue rivaroxaban given his CHADsVASC risk of 3.   2. I will see him back in 6 months.       [name], DO  06/06/2017

## 2017-07-12 ENCOUNTER — Ambulatory Visit
Admission: RE | Admit: 2017-07-12 | Discharge: 2017-07-12 | Disposition: A | Discharge status: Home / Self Care | Payer: Medicare Other | Source: Ambulatory Visit | Attending: Nurse Practitioner | Admitting: Nurse Practitioner

## 2017-07-12 DIAGNOSIS — I7101 Dissection of thoracic aorta: Secondary | ICD-10-CM | POA: Insufficient documentation

## 2017-07-12 DIAGNOSIS — I71019 Dissection of thoracic aorta, unspecified: Secondary | ICD-10-CM

## 2017-07-12 DIAGNOSIS — Q231 Congenital insufficiency of aortic valve: Secondary | ICD-10-CM | POA: Insufficient documentation

## 2017-07-12 LAB — I-STAT CREATININE
Creatinine I-Stat: 1.1 mg/dL (ref 0.80–1.30)
EGFR: 69 mL/min/{1.73_m2} (ref 60–150)

## 2017-07-12 MED ORDER — IOHEXOL 350 MG/ML IV SOLN
100.0000 mL | Freq: Once | INTRAVENOUS | Status: AC | PRN
Start: 2017-07-12 — End: 2017-07-12
  Administered 2017-07-12: 10:00:00 100 mL via INTRAVENOUS
  Filled 2017-07-12: qty 100

## 2017-07-14 ENCOUNTER — Ambulatory Visit: Payer: Medicare Other | Attending: Specialist | Admitting: Specialist

## 2017-07-14 ENCOUNTER — Encounter: Payer: Self-pay | Admitting: Specialist

## 2017-07-14 VITALS — BP 128/74 | HR 85 | Wt 161.4 lb

## 2017-07-14 DIAGNOSIS — I7101 Dissection of thoracic aorta: Secondary | ICD-10-CM

## 2017-07-14 DIAGNOSIS — I480 Paroxysmal atrial fibrillation: Secondary | ICD-10-CM

## 2017-07-14 DIAGNOSIS — Z09 Encounter for follow-up examination after completed treatment for conditions other than malignant neoplasm: Secondary | ICD-10-CM

## 2017-07-14 DIAGNOSIS — I71019 Dissection of thoracic aorta, unspecified: Secondary | ICD-10-CM

## 2017-07-14 NOTE — Patient Instructions (Signed)
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   The doctor found the aneurysm by looking at a CT scan or another test.    An aneurysm is a bulge on a blood vessel. It happens when an artery (blood vessel) becomes abnormally large and balloons outward like a weak spot on an old tire. Your aneurysm is on your aorta. The aorta is the largest blood vessel in the body. It starts at the heart, goes through the chest and the diaphragm, and then enters the belly. It brings blood to all parts of the body, including the head, organs, arms and legs. Thoracic refers to the chest or the area inside the rib cage. Your aneurysm is called a thoracic aortic aneurysm because it is in your chest.    Most aneurysms occur in people over 60. About five percent of men over the age of 60 develop aneurysms. Men are more likely than women to develop aneurysms.  Aneurysms happen for different reasons, including the following:  Atherosclerosis (hardening of the arteries) causes about 80% percent of aneurysms. It causes weak spots that make blood vessels bulge.   High blood pressure (Hypertension) that is not well-controlled.   Smoking.   Rare genetic diseases (like Marfan's Syndrome or, Ehlers-Danlos Syndrome) that make the blood vessels weak.    Most people with aneurysms don't notice any symptoms. Sometimes, an aneurysm bursts, causing sudden severe pain and internal bleeding. This is a life-threatening condition, often leading to death within minutes.  Symptoms of a thoracic aneurysm include chest pain, upper back pain, and/or shortness of breath. If the aneurysm extends into the belly, you may also have pain in you belly too.    It is extremely important for you to take all medications as prescribed, especially those prescribed for blood pressure control.  If you smoke, STOP IMMEDIATELY.  High blood pressure and smoking are the leading causes of continued growth in your aneurysm.  You will probably need a CT scan  or ultrasound every six months for the first year and then yearly thereafter if there is no significant change in the size or appearance of your aneurysm.    YOU SHOULD SEEK MEDICAL ATTENTION IMMEDIATELY, EITHER HERE OR AT THE NEAREST EMERGENCY DEPARTMENT, IF ANY OF THE FOLLOWING OCCURS:  You develop severe pain in your back, chest or belly.   You have shortness of breath.   You have a fainting spell (you pass out).

## 2017-07-14 NOTE — Progress Notes (Signed)
PROGRESS NOTE    Date Time: 07/14/2017 3:46 PM  [name]  Cardiologist: No att. providers found  Primary Care Physician: [name], MD      History of Presenting Illness:   Unknown [name] is a 66 y.o. male who presents to the office with   Chief Complaint   Patient presents with   . Follow-up     6 month f/u   cc: s/p hybrid afib ablation    PATIENT SEEN JOINTLY WITH [name]    [name] REVIEWED INFORMATION BELOW, DISCUSSED RELEVANT POINTS WITH PATIENTS AND PERSONALLY DEVELOPED PLAN OF CARE.       [name] presents for routine follow-up after hybrid atrial fibrillation ablation by [name] and Dr. [name] on 11/22/16. 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 CT at the time showed somewhat calcified dissection flap and was thought to not be an acute event. Patient presents today following recent CTA chest 07/12/2017. [name] reviewed imaging.  Sinus of Valsalva 45 mm, proximal ascending aorta 39 mm, mid ascending aorta 40 mm, distal ascending aorta 41 mm. No significant change in type B dissection which starts following the takeoff of the left subclavian. Maximum diameter 43.5 mm at the level of the left inferior venous return. There is a decreased size of the false lumen pockets communicating with the true lumen through fenestrations. Dissection involved celiac trunk and SMA. There is a second dissection noted infrarenally.     Patient reports chronic shortness of breath, previous pfts abnormal.  He notes occasional right center and left center chest pains with deep inspiration. Patient recently seen by Dr. [name] 06/06/2017 who reports he continues in sinus rhythm.    Past Medical History:     Past Medical History:   Diagnosis Date   . Arrhythmia    . Arthritis    . Atrial fibrillation    . Atrial flutter    . Bipolar affective    . BPH (benign prostatic  hyperplasia)    . Complication of anesthesia     resp. asessment   . Diabetes mellitus    . Diabetic neuropathy    . Gout    . Heart murmur    . HOH (hard of hearing)    . Hyperlipidemia    . Hypertension    . Paroxysmal atrial fibrillation    . Pulmonary hypertension    . Renal insufficiency    . Type 2 diabetes mellitus, controlled    . Wears glasses        Past Surgical History:     Past Surgical History:   Procedure Laterality Date   . APPENDECTOMY     . CARDIAC ABLATION     . CARDIAC CATHETERIZATION     . CARDIOVERSION      x 2   . FLUORO-NO CHARGE (HYBRID ROOM ONLY) N/A 11/22/2016    Procedure: FLUORO-NO CHARGE (HYBRID ROOM ONLY);  Surgeon: [name], MD;  Location: [facility] CATH/EP;  Service: Cardiovascular;  Laterality: N/A;   . HERNIA REPAIR     . PERICARDIOCENTESIS Left 11/25/2016    Procedure: PERICARDIOCENTESIS;  Surgeon: [name], MD;  Location[facility] CATH/EP;  Service: Cardiovascular;  Laterality: Left;   . TEE     . TONSILLECTOMY     . TONSILLECTOMY, ADENOIDECTOMY         Family History:     Family History   Problem  Relation Age of Onset   . Hypertension Mother    . Diabetes Mother    . Heart disease Mother    . Hypertension Sister    . Diabetes Sister    . Hyperlipidemia Sister    . Hypertension Brother    . Heart disease Brother    . Hyperlipidemia Brother    . Hypertension Brother    . Diabetes Brother    . Hyperlipidemia Brother    . Kidney disease Brother    . Hypertension Daughter        Social History:     History   Smoking Status   . Former Smoker   . Packs/day: 0.30   . Years: 40.00   . Types: Cigarettes   . Quit date: 1995   Smokeless Tobacco   . Never Used     Comment: 1 pack would last a week     History   Alcohol Use No     History   Drug Use No       Allergies:     Allergies   Allergen Reactions   . Codeine Nausea And Vomiting       Medications:     Current Outpatient Prescriptions   Medication Sig Dispense Refill   . allopurinol (ZYLOPRIM) 300 MG tablet Take 300 mg by mouth  daily.     Marland Kitchen atenolol (TENORMIN) 100 MG tablet Take 100 mg by mouth 2 (two) times daily.     . dilTIAZem (CARDIZEM CD) 240 MG 24 hr capsule Take 240 mg by mouth daily.     . divalproex EC/DR tablet (DEPAKOTE EC/DR) 250 MG EC tablet Take 500 mg by mouth 2 (two) times daily.     . furosemide (LASIX) 40 MG tablet Take 40 mg by mouth daily.         Marland Kitchen gabapentin (NEURONTIN) 100 MG capsule Take 100 mg by mouth 2 (two) times daily.     Marland Kitchen glimepiride (AMARYL) 1 MG tablet Take 0.5 mg by mouth every morning before breakfast.         . Loperamide HCl (IMODIUM PO) Take by mouth daily as needed.     . metFORMIN (GLUCOPHAGE) 1000 MG tablet Take 0.5 tablets (500 mg total) by mouth 2 (two) times daily with meals. (Patient taking differently: Take 1,000 mg by mouth 2 (two) times daily with meals.    ) 60 tablet 0   . minoxidil (LONITEN) 10 MG tablet Take 10 mg by mouth daily.     . Multiple Vitamin (MULTIVITAMIN) capsule Take 1 capsule by mouth daily.     . pravastatin (PRAVACHOL) 40 MG tablet Take 40 mg by mouth every evening.     . rivaroxaban (XARELTO) 20 MG Tab Take 20 mg by mouth daily with dinner.     . SENNA CO by Combination route daily as needed.     . tamsulosin (FLOMAX) 0.4 MG Cap Take 0.4 mg by mouth daily.       No current facility-administered medications for this visit.         Review of Systems:   Review of Systems   Constitutional: Negative for fever, chills,  unexpected weight change.   HENT: Negative for trouble swallowing and voice change.    Respiratory: Per HPI.   Cardiovascular: Per HPI.   Chest: Negative for sternal click/pop.  Gastrointestinal: Negative for nausea, vomiting, abdominal pain, diarrhea, constipation, or abdominal distention.   Genitourinary: Negative for dysuria, hematuria.  Skin: Negative for  incisional redness or drainage.   Extremities: Negative for edema.       Physical Exam:     Vitals:    07/14/17 1414   BP: 128/74   Pulse: 85   SpO2: 96%     Wt Readings from Last 3 Encounters:   07/14/17  73.2 kg (161 lb 6.4 oz)   06/06/17 71.8 kg (158 lb 6.4 oz)   01/26/17 70.4 kg (155 lb 3.2 oz)     Body mass index is 25.28 kg/m.     General: no acute distress.  Pleasant affect and actively participates in conversation regarding his care.  Ambulatory.  Neuro: awake, alert, oriented x 3.   HEENT: eomi, sclera anicteric.  Cardiovascular: regular rate and rhythm, soft flow murmur with radiation into right carotid.  Lungs:  Clear to auscultation bilaterally without significant diminishment  Abdomen: non-distended  Extremities: no edema.   Skin: pink, warm and dry      Assessment/Plan:     Patient Active Problem List    Diagnosis Date Noted   . Follow-up exam 01/26/2017   . 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 Chronic anticoagulation 11/25/2016   . Stroke 11/25/2016   . CKD (chronic kidney disease) 11/25/2016   . Former smoker, stopped smoking many years ago 11/25/2016   . Type 2 diabetes mellitus 11/25/2016   . HTN (hypertension) 11/25/2016   . HLD (hyperlipidemia) 11/25/2016   . Diabetic neuropathy 11/25/2016   . COPD (chronic obstructive pulmonary disease) 11/25/2016   . Bipolar disorder 11/25/2016   . BPH (benign prostatic hyperplasia) 11/25/2016   . Atrial fibrillation 11/22/2016     Date:11/22/2016    Pre-operative Diagnosis:  1- long-standing persistent atrial fibrillation  2- CKD  3- Reduced LVEF    Post-operative Diagnosis:same    Operation:   1- Hybrid left atrial ablation using bipolar radiofrequency (convergent procedure)  2- Transesophageal echocardiogram    Surgeon: [name], MD     . Atrial fibrillation, persistent 10/21/2016   . Chronic atrial fibrillation 10/08/2016     Added automatically from request for surgery [id number]     . Stage 4 chronic kidney disease 10/08/2016     Added automatically from request for surgery [id number]     . Persistent atrial fibrillation 10/07/2016   . Fatty liver 10/07/2016       1. Aftercare following surgery of the circulatory system    2. Atrial  fibrillation, s/p hybrid ablation- - stable, 8 months postoperative. Patient appears to be doing well. Recent visit with cardiology; appears to remain in sinus rhythm. Recent CTA shows stable dissection findings without acute or complicating features.  [name] discussed TEVAR repair.  Systolic blood pressure goal under 130. [name] would like patient to return to clinic in February to discuss further plans. Verbal and written aneurysm education given. Patient verbalizes understanding of testing results, plan of care. Opportunity given for questions and answers.    A copy of today's progress note will go to Dr. [name], Dr. [name]. Please do not hesitate to contact [name] or me with any questions or concerns. Office number [phone].        The orders placed during this visit, if any:  No orders of the defined types were placed in this encounter.       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   The doctor found the aneurysm by  looking at a CT scan or another test.    An aneurysm is a bulge on a blood vessel. It happens when an artery (blood vessel) becomes abnormally large and balloons outward like a weak spot on an old tire. Your aneurysm is on your aorta. The aorta is the largest blood vessel in the body. It starts at the heart, goes through the chest and the diaphragm, and then enters the belly. It brings blood to all parts of the body, including the head, organs, arms and legs. Thoracic refers to the chest or the area inside the rib cage. Your aneurysm is called a thoracic aortic aneurysm because it is in your chest.    Most aneurysms occur in people over 60. About five percent of men over the age of 74 develop aneurysms. Men are more likely than women to develop aneurysms.  Aneurysms happen for different reasons, including the following:  Atherosclerosis (hardening of the arteries) causes about 80%  percent of aneurysms. It causes weak spots that make blood vessels bulge.   High blood pressure (Hypertension) that is not well-controlled.   Smoking.   Rare genetic diseases (like Marfan's Syndrome or, Ehlers-Danlos Syndrome) that make the blood vessels weak.    Most people with aneurysms don't notice any symptoms. Sometimes, an aneurysm bursts, causing sudden severe pain and internal bleeding. This is a life-threatening condition, often leading to death within minutes.  Symptoms of a thoracic aneurysm include chest pain, upper back pain, and/or shortness of breath. If the aneurysm extends into the belly, you may also have pain in you belly too.    It is extremely important for you to take all medications as prescribed, especially those prescribed for blood pressure control.  If you smoke, STOP IMMEDIATELY.  High blood pressure and smoking are the leading causes of continued growth in your aneurysm.  You will probably need a CT scan or ultrasound every six months for the first year and then yearly thereafter if there is no significant change in the size or appearance of your aneurysm.    YOU SHOULD SEEK MEDICAL ATTENTION IMMEDIATELY, EITHER HERE OR AT THE NEAREST EMERGENCY DEPARTMENT, IF ANY OF THE FOLLOWING OCCURS:  You develop severe pain in your back, chest or belly.   You have shortness of breath.   You have a fainting spell (you pass out).                 No Follow-up on file.       Signed by: Beverly Gust, NP    This documentation was completed utilizing [employer]ognition Software.  Grammatical errors, random word insertions, pronoun errors and incomplete sentences are occasional consequences of this technology due to software limitations.  Not all errors are caught or corrected. Any questions or concerns about the content of this note or information contained within the body of this dictation should be addressed directly with the author for clarification.

## 2017-09-20 ENCOUNTER — Ambulatory Visit: Payer: Medicare Other | Admitting: Specialist

## 2017-09-27 ENCOUNTER — Telehealth: Payer: Self-pay | Admitting: Specialist

## 2017-09-27 ENCOUNTER — Ambulatory Visit: Payer: Medicare Other | Admitting: Specialist

## 2017-09-27 NOTE — Telephone Encounter (Signed)
Patient called back and confirmed appointment time and date. I will mail out letter as well.

## 2017-09-27 NOTE — Telephone Encounter (Signed)
LM for patient about rescheduling appointment for tomorrow with [name] due to the large amount of snow that is expected. I have r/s the patient to 3/5 at 2:30pm. I left new appointment information and asked the patient to call back and confirm this is okay.

## 2017-09-28 ENCOUNTER — Ambulatory Visit: Payer: Medicare Other | Admitting: Specialist

## 2017-10-11 ENCOUNTER — Ambulatory Visit: Payer: Medicare Other | Attending: Specialist | Admitting: Specialist

## 2017-10-11 ENCOUNTER — Encounter: Payer: Self-pay | Admitting: Specialist

## 2017-10-11 VITALS — BP 132/72 | HR 77 | Wt 167.2 lb

## 2017-10-11 DIAGNOSIS — I7101 Dissection of thoracic aorta: Secondary | ICD-10-CM

## 2017-10-11 DIAGNOSIS — I71019 Dissection of thoracic aorta, unspecified: Secondary | ICD-10-CM

## 2017-10-11 DIAGNOSIS — Z09 Encounter for follow-up examination after completed treatment for conditions other than malignant neoplasm: Secondary | ICD-10-CM

## 2017-10-11 DIAGNOSIS — I480 Paroxysmal atrial fibrillation: Secondary | ICD-10-CM

## 2017-10-11 NOTE — Progress Notes (Addendum)
PROGRESS NOTE  [facility])     Date Time: 10/11/2017 2:54 PM  [name]  Cardiologist: [name], MD   Primary Care Physician: [name], MD        History of Presenting Illness:   [name] is a 67 y.o. male who presents to the office with   Chief Complaint   Patient presents with   . Follow-up     a fib ablation   Pt. With hx of chronic afib, S/P hybrid ablation  procedure 11/22/2016 , with [name] and Dr. Adele Schilder back in [place] , HR 77, BP 132/72 on atenolol 100 mg bid. 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 Shoulder discomfort or SOB,most recent CTA 07/12/2017 with results: COMPARISON: 01/05/2017    FINDINGS:  The study is of very good quality.    The aortic valve bicuspid, type 0.  There is calcification and 6.4 mm prolapse of the right leaflet.  The sinuses of Valsalva are stable at 45.1 mm.  The proximal ascending aorta is stable at 39 mm mm.  The mid ascending aorta is stable at 40 mm.  The distal ascending aorta is stable at 41 mm.  The transverse aorta is stable..  No significant change in the type B dissection, beginning just after takeoff of the left subclavian. The maximum diameter of the aorta is stable at 43.5 mm at the level of the left inferior venous return.  There has been decrease in size of the numerous false lumen pocket communicating with the true lumens through small fenestrations.    The dissection extends into the abdomen, to involve the celiac trunk and SMA.  A second chronic dissection is noted below the level of the right and left renal    This is a left dominant circulation.      The aortic arch branches show bovine configuration of the aortic arch. There is little distance between the common origin of the brachiocephalic/left carotid and of the left subclavian.    In the abdomen, the celiac  and the SMA arise from the true lumen.  At the level of the second, caudad chronic dissection, the left and right renal arteries arise from the true lumen.    Visible portions of the lungs   show no consolidation, mass or evidence of interstitial disease.    Visible portions of the mediastinum shows no significant adenopathy.  There is no pericardial effusion.    IMPRESSION:   IMPRESSION:   Gated Angio- CT of the thorax showing no significant change in size of the type B dissection. There has been decrease in size of the opacified pockets of false lumen through small fenestrations.  The celiac, SMA arise from the true lumen.    A second dissection is identified caudad to distal and of the dissection described above, beginning at the takeoff of the renals.    This is a bicuspid aortic valve, type 0, with a prominent 6.4 mm calcification and prolapse of the right coronary leaflet.  The sinuses of Valsalva are slightly prominent.  The sinotubular junction is preserved. Descending aorta is slightly prominent for age.  The transverse aorta is of normal size. There is a bovine configuration of the aortic arch.  No significant change in aorta size or new dissections noted     Past Medical History:  Past Medical History:   Diagnosis Date   . Arrhythmia    . Arthritis    . Atrial fibrillation    . Atrial flutter    . Bipolar affective    . BPH (benign prostatic hyperplasia)    . Complication of anesthesia     resp. asessment   . Diabetes mellitus    . Diabetic neuropathy    . Gout    . Heart murmur    . HOH (hard of hearing)    . Hyperlipidemia    . Hypertension    . Paroxysmal atrial fibrillation    . Pulmonary hypertension    . Renal insufficiency    . Type 2 diabetes mellitus, controlled    . Wears glasses        Past Surgical History:     Past Surgical History:   Procedure Laterality Date   . APPENDECTOMY     . CARDIAC ABLATION     . CARDIAC CATHETERIZATION     . CARDIOVERSION      x 2   . FLUORO-NO CHARGE  (HYBRID ROOM ONLY) N/A 11/22/2016    Procedure: FLUORO-NO CHARGE (HYBRID ROOM ONLY);  Surgeon: [name], MD;  Location[facility] CATH/EP;  Service: Cardiovascular;  Laterality: N/A;   . HERNIA REPAIR     . PERICARDIOCENTESIS Left 11/25/2016    Procedure: PERICARDIOCENTESIS;  Surgeon: [name], MD;  Location: [facility] CATH/EP;  Service: Cardiovascular;  Laterality: Left;   . TEE     . TONSILLECTOMY     . TONSILLECTOMY, ADENOIDECTOMY         Family History:     Family History   Problem Relation Age of Onset   . Hypertension Mother    . Diabetes Mother    . Heart disease Mother    . Hypertension Sister    . Diabetes Sister    . Hyperlipidemia Sister    . Hypertension Brother    . Heart disease Brother    . Hyperlipidemia Brother    . Hypertension Brother    . Diabetes Brother    . Hyperlipidemia Brother    . Kidney disease Brother    . Hypertension Daughter        Social History:     History   Smoking Status   . Former Smoker   . Packs/day: 0.30   . Years: 40.00   . Types: Cigarettes   . Quit date: 1995   Smokeless Tobacco   . Never Used     Comment: 1 pack would last a week     History   Alcohol Use No     History   Drug Use No       Allergies:     Allergies   Allergen Reactions   . Codeine Nausea And Vomiting       Medications:     Current Outpatient Prescriptions   Medication Sig Dispense Refill   . acetaminophen (TYLENOL) 500 MG tablet Take 500 mg by mouth every 6 (six) hours as needed for Pain.     Marland Kitchen allopurinol (ZYLOPRIM) 300 MG tablet Take 300 mg by mouth daily.     Marland Kitchen atenolol (TENORMIN) 100 MG tablet Take 100 mg by mouth 2 (two) times daily.     . divalproex EC/DR tablet (DEPAKOTE EC/DR) 250 MG EC tablet Take 500 mg by mouth 2 (two) times daily.     . furosemide (LASIX) 80 MG tablet Take 80 mg by mouth 2 (  two) times daily.     Marland Kitchen gabapentin (NEURONTIN) 100 MG capsule Take 100 mg by mouth 2 (two) times daily.     Marland Kitchen glimepiride (AMARYL) 1 MG tablet Take 0.5 mg by mouth every morning before breakfast.          . Loperamide HCl (IMODIUM PO) Take by mouth daily as needed.     . metFORMIN (GLUMETZA) 1000 MG (MOD) 24 hr tablet Take 1,000 mg by mouth every morning with breakfast.     . minoxidil (LONITEN) 10 MG tablet Take 10 mg by mouth daily.     . Multiple Vitamin (MULTIVITAMIN) capsule Take 1 capsule by mouth daily.     Marland Kitchen olmesartan (BENICAR) 5 MG tablet Take 5 mg by mouth daily.     Marland Kitchen Phytonadione (K 100 PO) Take by mouth.     . pravastatin (PRAVACHOL) 40 MG tablet Take 40 mg by mouth every evening.     . rivaroxaban (XARELTO) 20 MG Tab Take 20 mg by mouth daily with dinner.     . SENNA CO by Combination route daily as needed.     . tamsulosin (FLOMAX) 0.4 MG Cap Take 0.4 mg by mouth daily.       No current facility-administered medications for this visit.        Review of Systems:     ROS Unremarkable, no SOB, chest pain or upper back or scapular pain    Physical Exam:     Vitals:    10/11/17 1415   BP: 132/72   Pulse: 77   SpO2: 98%     Body mass index is 26.19 kg/m.     Physical Exam   Constitutional: He is oriented to person, place, and time. He appears well-developed and well-nourished.   HENT:   Head: Normocephalic and atraumatic.   Eyes: Pupils are equal, round, and reactive to light. EOM are normal.   Neck: Normal range of motion. Neck supple.   Cardiovascular: Normal rate and regular rhythm.    Pulmonary/Chest: Effort normal and breath sounds normal.   Abdominal: Soft. Bowel sounds are normal.   Musculoskeletal: Normal range of motion.   Neurological: He is alert and oriented to person, place, and time.   Skin: Skin is warm and dry.   Psychiatric: He has a normal mood and affect. His behavior is normal.       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 Possible TEVAR date in April.    Patient Active Problem List    Diagnosis Date Noted   . Follow-up exam 01/26/2017   . 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 Chronic anticoagulation 11/25/2016   . Stroke 11/25/2016   . CKD (chronic kidney  disease) 11/25/2016   . Former smoker, stopped smoking many years ago 11/25/2016   . Type 2 diabetes mellitus 11/25/2016   . HTN (hypertension) 11/25/2016   . HLD (hyperlipidemia) 11/25/2016   . Diabetic neuropathy 11/25/2016   . COPD (chronic obstructive pulmonary disease) 11/25/2016   . Bipolar disorder 11/25/2016   . BPH (benign prostatic hyperplasia) 11/25/2016   . Atrial fibrillation 11/22/2016     Date:11/22/2016    Pre-operative Diagnosis:  1- long-standing persistent atrial fibrillation  2- CKD  3- Reduced LVEF    Post-operative Diagnosis:same    Operation:   1- Hybrid left atrial ablation using bipolar radiofrequency (convergent procedure)  2- Transesophageal echocardiogram    Surgeon: [name], MD     . Atrial fibrillation,  persistent 10/21/2016   . Chronic atrial fibrillation 10/08/2016     Added automatically from request for surgery [id number]     . Stage 4 chronic kidney disease 10/08/2016     Added automatically from request for surgery [id number]     . Persistent atrial fibrillation 10/07/2016   . Fatty liver 10/07/2016       1. Follow-up exam     2. Paroxysmal atrial fibrillation     3. Aortic dissection, thoracic         Thank you for allowing [place] to participate in this patient's care. Please feel free to contact me or [name] for any further questions or concerns.      Signed by: Rosanne Sack    [facility]  [phone]

## 2017-11-29 ENCOUNTER — Telehealth: Payer: Self-pay

## 2017-11-29 NOTE — Telephone Encounter (Signed)
Faxed request to pcp for ov, labs

## 2017-11-30 NOTE — Telephone Encounter (Signed)
PCP notes and labs received from [place].

## 2017-12-05 ENCOUNTER — Encounter: Payer: Self-pay | Admitting: Internal Medicine

## 2017-12-05 ENCOUNTER — Ambulatory Visit: Payer: Medicare Other | Admitting: Internal Medicine

## 2017-12-05 VITALS — BP 148/88 | HR 70 | Ht 67.0 in | Wt 167.8 lb

## 2017-12-05 DIAGNOSIS — I480 Paroxysmal atrial fibrillation: Secondary | ICD-10-CM

## 2017-12-05 DIAGNOSIS — I350 Nonrheumatic aortic (valve) stenosis: Secondary | ICD-10-CM

## 2017-12-05 DIAGNOSIS — I7102 Dissection of abdominal aorta: Secondary | ICD-10-CM

## 2017-12-05 NOTE — Progress Notes (Signed)
Cardiology Follow-Up      [name]   Date of Birth: 12/11/1950    Provider: [name], DO     Patient Care Team:  [name] Delphia Grates, MD as PCP - General (Family Medicine)  [name], DO as Consulting Physician (Cardiology)  [name], MD as Consulting Physician (Thoracic and Cardiac Surgery)    Chief Complaint: Atrial Fibrillation      History of Present Illness   [name] is a 67 y.o. male being seen today for atrial fibrillation.     He feels well. He is trying to find work - he has many bills to pay. He has not had atrial fibrillation since convergent procedure performed last year     We performed convergent AF ablation last year around April 16th. He has not had recurrent AF since cardioversion while in the hospital. He did have a pericardial effusion as a sequelae of the procedure, this was drained without recurrence.     No chest pain or shortness of breath is noted.       Review of Systems   Constitution: Negative for activity change, appetite change, fatigue, fever, and unexpected weight change  HENT: Negative for trouble swallowing, and voice change  Eyes: Negative for any visual disturbances  Respiratory: Negative for apnea and/or awakening with sob, chest tightness, and sob  Cardiovascular: Negative for chest pain/discomfort, leg or ankle swelling, difficulty breathing lying down, and palpitations  Gastrointestinal: Negative for abdominal pain, blood in stool, nausea, and vomiting  Endocrine: Negative for cold intolerance, heat intolerance, and polydipsia  GU: Negative for flank pain, frequent urination, and hematuria  Musculoskeletal: Negative for gait problems, joint swelling, neck pain, and pain or heaviness in legs  Psychiatric: Negative for anxiety, and sleep disturbance    Comprehensive review of systems performed by me. Unless otherwise noted, all systems are negative.    Past Medical History     Past Medical History 12/05/17 ADV      1.Atrial fibrillation, new onset, 10/21/11, cardioverted and placed on sotalol.   a. Cardioversion 10/22/11.    b. Echo 10/22/11- moderate-severe concentric LVH.Biatrial enlargement.Mild AS, dilated inferior vena cava, small pericardial effusion.Peak velocity across the AV at 2.2 meters/s.   c. Cardiac abltation 01/13/15.    d. MRI Cardiac 03/16/16- LVEF 68.4%. Moderate septal wall hypertrophy (1.6cm) and mild posterior wall hypertrophy (1.3cm). Focal area of mid-myocardial wall delayed enhancement in basal anteroseptal wall and RV insertion point consistent with hypertensive heart disease. RVEF 41.7%. Severe LA enlargement and moderate RA enlargement. 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 Mild AS with peak velocity 2.15m/s. Mild central AR. Mild MR and TR. Small to moderate pericardial effusion. Mildly dilated ascending aorta. Aortic arch and descending thoracic aorta are ectatic. Trivial right pleural effusion. Dilated central pulm artery at 4.3cm.    e. s/p TEE and [place] cardioversion 06/04/16  F. Convergent AF Ablation 11/22/16 - Procedure was complicated with pericardial effusion/ tamponade requiring pericardiocentesis. He developed left pleural effusion with thoracentesis.   g. Echo 12/03/16- No pericardial effusion, aortic valve is severely calcified with severely restricted systolic opening, mild regurgitation, LV is normal in size with moderate to severe concentric hypertrophy, systolic function is preserved with an EF 03-55%, diastolic dysfunction that couldn't be graded.     2.Hypertension.  3. Hyperlipidemia.   4.Type I Diabetes Mellitus.  5.Bipolar disorder.  6.Fatty liver.  7.Renal insufficiency.   a. Normal  renal ultrasound October 23, 2011.    Past Surgical History     Past Surgical History:   Procedure Laterality Date   . APPENDECTOMY     . CARDIAC ABLATION     . CARDIAC CATHETERIZATION     . CARDIOVERSION       x 2   . FLUORO-NO CHARGE (HYBRID ROOM ONLY) N/A 11/22/2016    Procedure: FLUORO-NO CHARGE (HYBRID ROOM ONLY);  Surgeon: [name], MD;  Location: [facility] CATH/EP;  Service: Cardiovascular;  Laterality: N/A;   . HERNIA REPAIR     . PERICARDIOCENTESIS Left 11/25/2016    Procedure: PERICARDIOCENTESIS;  Surgeon: [name], MD;  Location[facility] CATH/EP;  Service: Cardiovascular;  Laterality: Left;   . TEE     . TONSILLECTOMY     . TONSILLECTOMY, ADENOIDECTOMY         Family History     Family History   Problem Relation Age of Onset   . Hypertension Mother    . Diabetes Mother    . Heart disease Mother    . Hypertension Sister    . Diabetes Sister    . Hyperlipidemia Sister    . Hypertension Brother    . Heart disease Brother    . Hyperlipidemia Brother    . Hypertension Brother    . Diabetes Brother    . Hyperlipidemia Brother    . Kidney disease Brother    . Hypertension Daughter        Social History     Social History   Substance Use Topics   . Smoking status: Former Smoker     Packs/day: 0.30     Years: 40.00     Types: Cigarettes     Quit date: 1995   . Smokeless tobacco: Never Used      Comment: 1 pack would last a week   . Alcohol use No       Allergies     is allergic to codeine.    Medications       Current Outpatient Prescriptions:   .  acetaminophen (TYLENOL) 500 MG tablet, Take 500 mg by mouth every 6 (six) hours as needed for Pain., Disp: , Rfl:   .  allopurinol (ZYLOPRIM) 300 MG tablet, Take 300 mg by mouth daily., Disp: , Rfl:   .  atenolol (TENORMIN) 100 MG tablet, Take 100 mg by mouth 2 (two) times daily., Disp: , Rfl:   .  divalproex EC/DR tablet (DEPAKOTE EC/DR) 250 MG EC tablet, Take 500 mg by mouth 2 (two) times daily., Disp: , Rfl:   .  furosemide (LASIX) 80 MG tablet, Take 80 mg by mouth daily   , Disp: , Rfl:   .  gabapentin (NEURONTIN) 100 MG capsule, Take 100 mg by mouth 2 (two) times daily., Disp: , Rfl:   .  glimepiride (AMARYL) 1 MG tablet, Take 0.5 mg by mouth every morning before  breakfast.  , Disp: , Rfl:   .  Loperamide HCl (IMODIUM PO), Take by mouth daily as needed., Disp: , Rfl:   .  metFORMIN (GLUMETZA) 1000 MG (MOD) 24 hr tablet, Take 1,000 mg by mouth 2 (two) times daily with meals   , Disp: , Rfl:   .  minoxidil (LONITEN) 10 MG tablet, Take 30 mg by mouth daily   , Disp: , Rfl:   .  Multiple Vitamin (MULTIVITAMIN) capsule, Take 1 capsule by mouth daily., Disp: , Rfl:   .  Phytonadione (K  100 PO), Take by mouth 2 (two) times daily   , Disp: , Rfl:   .  pravastatin (PRAVACHOL) 40 MG tablet, Take 40 mg by mouth every evening., Disp: , Rfl:   .  rivaroxaban (XARELTO) 20 MG Tab, Take 20 mg by mouth daily with dinner., Disp: , Rfl:   .  SENNA CO, by Combination route daily as needed., Disp: , Rfl:   .  tamsulosin (FLOMAX) 0.4 MG Cap, Take 0.4 mg by mouth daily., Disp: , Rfl:   .  telmisartan (MICARDIS) 20 MG tablet, Take 20 mg by mouth daily, Disp: , Rfl:     Physical Exam     Visit Vitals  BP 148/88 (BP Site: Right arm, Patient Position: Sitting)   Pulse 70   Ht 1.702 m (5\' 7" )   Wt 76.1 kg (167 lb 12.8 oz)   BMI 26.28 kg/m     Vitals:    12/05/17 1550   BP: 148/88   BP Site: Right arm   Patient Position: Sitting   Pulse: 70   Weight: 76.1 kg (167 lb 12.8 oz)   Height: 1.702 m (5\' 7" )     Wt Readings from Last 3 Encounters:   12/05/17 76.1 kg (167 lb 12.8 oz)   10/11/17 75.8 kg (167 lb 3.2 oz)   07/14/17 73.2 kg (161 lb 6.4 oz)        Constitutional -  Well appearing, and in no distress  Respiratory - Clear to auscultation bilaterally, normal respiratory effort    Cardiovascular system -    Regular rate and rhythm    Normal S1, S2    III/VI systolic murmur heard throughout the precordium. No rubs, gallops   Jugular venous pulse is normal        Radiated systolic murmur is noted.    2+ pulses in the posterior tibial / dorsalis pedis bilaterally    Neurological - Alert, oriented, no focal neurological deficits      Labs     Lab Results   Component Value Date/Time    WBC 13.3 (H)  12/09/2016 07:10 AM    RBC 3.14 (L) 12/09/2016 07:10 AM    HGB 8.9 (L) 12/09/2016 07:10 AM    HCT 29.2 (L) 12/09/2016 07:10 AM    PLT 621 (H) 12/09/2016 07:10 AM    TSH 1.81 11/26/2016 03:02 AM       Lab Results   Component Value Date/Time    NA 135 (L) 12/09/2016 07:10 AM    NA 135 (L) 12/09/2016 07:10 AM    K 3.7 12/09/2016 07:10 AM    K 3.7 12/09/2016 07:10 AM    CL 96 (L) 12/09/2016 07:10 AM    CL 96 (L) 12/09/2016 07:10 AM    CO2 30.9 (H) 12/09/2016 07:10 AM    CO2 30.9 (H) 12/09/2016 07:10 AM    GLU 115 (H) 12/09/2016 07:10 AM    GLU 115 (H) 12/09/2016 07:10 AM    BUN 16 12/09/2016 07:10 AM    BUN 16 12/09/2016 07:10 AM    CREAT 1.10 07/12/2017 09:34 AM    CREAT 1.13 12/09/2016 07:10 AM    CREAT 1.13 12/09/2016 07:10 AM    PROT 6.5 12/09/2016 07:10 AM    ALKPHOS 117 12/09/2016 07:10 AM    AST 27 12/09/2016 07:10 AM    ALT 44 12/09/2016 07:10 AM       Lab Results   Component Value Date/Time    CHOL 70 (L) 11/26/2016 03:02  AM    TRIG 78 11/26/2016 03:02 AM    HDL 21 (L) 11/26/2016 03:02 AM    LDL 33 11/26/2016 03:02 AM       Lab Results   Component Value Date/Time    HGBA1CPERCNT 6.5 11/26/2016 03:02 AM       Cardiogenics:     EKG: Sinus rhythm, LVH is noted, non-specific IVCD.     Impression and Recommendations:     1. Paroxysmal atrial fibrillation  - Ambulatory referral to Cardiothoracic Surgery    2. Dissection of abdominal aorta  - Echocardiogram Adult Complete W Clr/ Dopp Waveform; Future  - Hospital - CT Angiogram Chest; Future    3. Nonrheumatic aortic (valve) stenosis  - Echocardiogram Adult Complete W Clr/ Dopp Waveform; Future  - Hospital - CT Angiogram Chest; Future        1. Doing well from the AF standpoint, continues on anticoagulation with rivaroxaban.   2. 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 Will repeat CTA. Referral to [name].   3. Aortic stenosis - probably moderate - will repeat echocardiogram.   4. I will see him back in 6 months.       Electronically signed by:   [name], DO  12/05/2017

## 2017-12-08 ENCOUNTER — Ambulatory Visit: Payer: Medicare Other

## 2017-12-08 DIAGNOSIS — N184 Chronic kidney disease, stage 4 (severe): Secondary | ICD-10-CM

## 2017-12-08 DIAGNOSIS — I7102 Dissection of abdominal aorta: Secondary | ICD-10-CM

## 2017-12-08 DIAGNOSIS — I350 Nonrheumatic aortic (valve) stenosis: Secondary | ICD-10-CM

## 2017-12-08 DIAGNOSIS — I480 Paroxysmal atrial fibrillation: Secondary | ICD-10-CM

## 2017-12-08 DIAGNOSIS — I71019 Dissection of thoracic aorta, unspecified: Secondary | ICD-10-CM

## 2017-12-20 ENCOUNTER — Encounter: Payer: Self-pay | Admitting: Internal Medicine

## 2017-12-23 ENCOUNTER — Ambulatory Visit
Admission: RE | Admit: 2017-12-23 | Discharge: 2017-12-23 | Disposition: A | Discharge status: Home / Self Care | Payer: Medicare Other | Source: Ambulatory Visit | Attending: Internal Medicine | Admitting: Internal Medicine

## 2017-12-23 DIAGNOSIS — I350 Nonrheumatic aortic (valve) stenosis: Secondary | ICD-10-CM | POA: Insufficient documentation

## 2017-12-23 DIAGNOSIS — I7102 Dissection of abdominal aorta: Secondary | ICD-10-CM

## 2017-12-23 DIAGNOSIS — Z01812 Encounter for preprocedural laboratory examination: Secondary | ICD-10-CM | POA: Insufficient documentation

## 2017-12-23 DIAGNOSIS — Z1389 Encounter for screening for other disorder: Secondary | ICD-10-CM | POA: Insufficient documentation

## 2017-12-23 LAB — I-STAT CREATININE
Creatinine I-Stat: 1.2 mg/dL (ref 0.80–1.30)
EGFR: 62 mL/min/{1.73_m2} (ref 60–150)

## 2017-12-23 MED ORDER — IOHEXOL 350 MG/ML IV SOLN
70.0000 mL | Freq: Once | INTRAVENOUS | Status: AC | PRN
Start: 2017-12-23 — End: 2017-12-23
  Administered 2017-12-23: 16:00:00 70 mL via INTRAVENOUS
  Filled 2017-12-23: qty 70

## 2018-01-03 ENCOUNTER — Encounter: Payer: Self-pay | Admitting: Specialist

## 2018-01-03 ENCOUNTER — Ambulatory Visit: Payer: Medicare Other | Attending: Internal Medicine | Admitting: Specialist

## 2018-01-03 VITALS — BP 134/74 | HR 73 | Wt 172.2 lb

## 2018-01-03 DIAGNOSIS — I71019 Dissection of thoracic aorta, unspecified: Secondary | ICD-10-CM

## 2018-01-03 DIAGNOSIS — I7101 Dissection of thoracic aorta: Secondary | ICD-10-CM

## 2018-01-03 NOTE — Patient Instructions (Signed)
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   The doctor found the aneurysm by looking at a CT scan or another test.    An aneurysm is a bulge on a blood vessel. It happens when an artery (blood vessel) becomes abnormally large and balloons outward like a weak spot on an old tire. Your aneurysm is on your aorta. The aorta is the largest blood vessel in the body. It starts at the heart, goes through the chest and the diaphragm, and then enters the belly. It brings blood to all parts of the body, including the head, organs, arms and legs. Thoracic refers to the chest or the area inside the rib cage. Your aneurysm is called a thoracic aortic aneurysm because it is in your chest.    Most aneurysms occur in people over 60. About five percent of men over the age of 60 develop aneurysms. Men are more likely than women to develop aneurysms.  Aneurysms happen for different reasons, including the following:  Atherosclerosis (hardening of the arteries) causes about 80% percent of aneurysms. It causes weak spots that make blood vessels bulge.   High blood pressure (Hypertension) that is not well-controlled.   Smoking.   Rare genetic diseases (like Marfan's Syndrome or, Ehlers-Danlos Syndrome) that make the blood vessels weak.    Most people with aneurysms don't notice any symptoms. Sometimes, an aneurysm bursts, causing sudden severe pain and internal bleeding. This is a life-threatening condition, often leading to death within minutes.  Symptoms of a thoracic aneurysm include chest pain, upper back pain, and/or shortness of breath. If the aneurysm extends into the belly, you may also have pain in you belly too.    It is extremely important for you to take all medications as prescribed, especially those prescribed for blood pressure control.  If you smoke, STOP IMMEDIATELY.  High blood pressure and smoking are the leading causes of continued growth in your aneurysm.  You will probably need a CT scan  or ultrasound every six months for the first year and then yearly thereafter if there is no significant change in the size or appearance of your aneurysm.    YOU SHOULD SEEK MEDICAL ATTENTION IMMEDIATELY, EITHER HERE OR AT THE NEAREST EMERGENCY DEPARTMENT, IF ANY OF THE FOLLOWING OCCURS:  You develop severe pain in your back, chest or belly.   You have shortness of breath.   You have a fainting spell (you pass out).

## 2018-01-03 NOTE — Progress Notes (Signed)
PROGRESS NOTE    Date Time: 01/03/2018 4:59 PM  [name]  Cardiologist: [name], DO  Primary Care Physician: [name], MD      History of Presenting Illness:   [name] is a 67 y.o. male who presents to the office with   Chief Complaint   Patient presents with   . Follow-up     paroxysmal a fib   cc: s/p hybrid afib ablation, type B dissection    PATIENT SEEN JOINTLY WITH [name]    [name] REVIEWED INFORMATION BELOW, DISCUSSED RELEVANT POINTS WITH PATIENTS AND PERSONALLY DEVELOPED PLAN OF CARE.       [name] presents for routine follow-up after hybrid atrial fibrillation ablation by [name] and Dr. [name] on 11/22/16. 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 CT at the time showed somewhat calcified dissection flap and was thought to not be an acute event. Patient has been followed closely as [name] has advised patient he may recommend TEVAR in the future should dissection dynamics change.  Patient enters today's appointment without complaint of chest pain.  He reports his work of breathing is slowly improving.    Recent CTA chest on 12/23/2017 shows maximum diameter of a sending thoracic aorta 4.2 cm which is not significantly changed from prior study.  Previously noted partially thrombosed dissection of the descending aorta is unchanged.  It begins distal to the left subclavian artery, maximum diameter 4.3 cm.  Dissection extends into the upper abdominal aorta and terminates at the level of the celiac axis.  There is a second area of dissection flap just below this and near the right renal artery without interval change.     An echocardiogram from 12/08/2017 shows LV normal in size with moderate LVH.  EF 60 to 65%.  Left atrium severely dilated.  Right atrium severely dilated.  Mild mitral regurgitation.  Moderate tricuspid  regurgitation.  Moderate aortic stenosis with peak velocity 3.1 m/s, mean pressure gradient 19 mmHg.  Mild to moderate aortic regurgitation.  Mild pulmonic regurgitation.    Past Medical History:     Past Medical History:   Diagnosis Date   . Arrhythmia    . Arthritis    . Atrial fibrillation    . Atrial flutter    . Bipolar affective    . BPH (benign prostatic hyperplasia)    . Complication of anesthesia     resp. asessment   . Diabetes mellitus    . Diabetic neuropathy    . Gout    . Heart murmur    . HOH (hard of hearing)    . Hyperlipidemia    . Hypertension    . Paroxysmal atrial fibrillation    . Pulmonary hypertension    . Renal insufficiency    . Type 2 diabetes mellitus, controlled    . Wears glasses        Past Surgical History:     Past Surgical History:   Procedure Laterality Date   . APPENDECTOMY     . CARDIAC ABLATION     . CARDIAC CATHETERIZATION     . CARDIOVERSION      x 2   . FLUORO-NO CHARGE (HYBRID ROOM ONLY) N/A 11/22/2016    Procedure: FLUORO-NO CHARGE (HYBRID ROOM ONLY);  Surgeon: [name], MD;  Location: [facility] CATH/EP;  Service: Cardiovascular;  Laterality: N/A;   . HERNIA REPAIR     .  PERICARDIOCENTESIS Left 11/25/2016    Procedure: PERICARDIOCENTESIS;  Surgeon: [name], MD;  Location: [facility] CATH/EP;  Service: Cardiovascular;  Laterality: Left;   . TEE     . TONSILLECTOMY     . TONSILLECTOMY, ADENOIDECTOMY         Family History:     Family History   Problem Relation Age of Onset   . Hypertension Mother    . Diabetes Mother    . Heart disease Mother    . Hypertension Sister    . Diabetes Sister    . Hyperlipidemia Sister    . Hypertension Brother    . Heart disease Brother    . Hyperlipidemia Brother    . Hypertension Brother    . Diabetes Brother    . Hyperlipidemia Brother    . Kidney disease Brother    . Hypertension Daughter        Social History:     History   Smoking Status   . Former Smoker   . Packs/day: 0.30   . Years: 40.00   . Types: Cigarettes   . Quit date: 1995    Smokeless Tobacco   . Never Used     Comment: 1 pack would last a week     History   Alcohol Use No     History   Drug Use No       Allergies:     Allergies   Allergen Reactions   . Codeine Nausea And Vomiting       Medications:     Current Outpatient Prescriptions   Medication Sig Dispense Refill   . acetaminophen (TYLENOL) 500 MG tablet Take 500 mg by mouth every 6 (six) hours as needed for Pain.     Marland Kitchen allopurinol (ZYLOPRIM) 300 MG tablet Take 300 mg by mouth daily.     Marland Kitchen atenolol (TENORMIN) 100 MG tablet Take 100 mg by mouth 2 (two) times daily.     . divalproex EC/DR tablet (DEPAKOTE EC/DR) 250 MG EC tablet Take 500 mg by mouth 2 (two) times daily.     . furosemide (LASIX) 80 MG tablet Take 80 mg by mouth daily         . gabapentin (NEURONTIN) 100 MG capsule Take 100 mg by mouth 2 (two) times daily.     Marland Kitchen glimepiride (AMARYL) 1 MG tablet Take 0.5 mg by mouth every morning before breakfast.         . Loperamide HCl (IMODIUM PO) Take by mouth daily as needed.     . metFORMIN (GLUMETZA) 1000 MG (MOD) 24 hr tablet Take 1,000 mg by mouth 2 (two) times daily with meals         . minoxidil (LONITEN) 10 MG tablet Take 30 mg by mouth daily         . Multiple Vitamin (MULTIVITAMIN) capsule Take 1 capsule by mouth daily.     . naproxen (NAPROSYN) 250 MG tablet Take 250 mg by mouth 2 (two) times daily with meals     . Phytonadione (K 100 PO) Take by mouth 2 (two) times daily         . pravastatin (PRAVACHOL) 40 MG tablet Take 40 mg by mouth every evening.     . rivaroxaban (XARELTO) 20 MG Tab Take 20 mg by mouth daily with dinner.     . SENNA CO by Combination route daily as needed.     . tamsulosin (FLOMAX) 0.4 MG Cap Take 0.4 mg by  mouth daily.     Marland Kitchen telmisartan (MICARDIS) 20 MG tablet Take 20 mg by mouth daily       No current facility-administered medications for this visit.         Review of Systems:   Review of Systems   Constitutional: Negative for fever, chills,  unexpected weight change.   HENT: Negative for  trouble swallowing and voice change.    Respiratory: Per HPI.   Cardiovascular: Per HPI.   Gastrointestinal: Negative for nausea, vomiting, abdominal pain, diarrhea, constipation, or abdominal distention.   Genitourinary: Negative for dysuria, hematuria.  Skin: Negative for incisional redness or drainage.   Extremities: Negative for edema.       Physical Exam:     Vitals:    01/03/18 1309   BP: 134/74   Pulse: 73   SpO2: 95%     Wt Readings from Last 3 Encounters:   01/03/18 78.1 kg (172 lb 3.2 oz)   12/05/17 76.1 kg (167 lb 12.8 oz)   10/11/17 75.8 kg (167 lb 3.2 oz)     Body mass index is 26.97 kg/m.     General: no acute distress.  Pleasant affect and actively participates in conversation regarding his care.  Ambulatory.  Neuro: awake, alert, oriented x 3.   HEENT: eomi, sclera anicteric.  Cardiovascular: regular rate and rhythm, soft flow murmur.  Lungs:  Clear to auscultation bilaterally without significant diminishment  Abdomen: non-distended  Extremities: no edema.   Skin: pink, warm and dry      Assessment/Plan:     Patient Active Problem List    Diagnosis Date Noted   . Follow-up exam 01/26/2017   . 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 Chronic anticoagulation 11/25/2016   . Stroke 11/25/2016   . CKD (chronic kidney disease) 11/25/2016   . Former smoker, stopped smoking many years ago 11/25/2016   . Type 2 diabetes mellitus 11/25/2016   . HTN (hypertension) 11/25/2016   . HLD (hyperlipidemia) 11/25/2016   . Diabetic neuropathy 11/25/2016   . COPD (chronic obstructive pulmonary disease) 11/25/2016   . Bipolar disorder 11/25/2016   . BPH (benign prostatic hyperplasia) 11/25/2016   . Atrial fibrillation 11/22/2016     Date:11/22/2016    Pre-operative Diagnosis:  1- long-standing persistent atrial fibrillation  2- CKD  3- Reduced LVEF    Post-operative Diagnosis:same    Operation:   1- Hybrid left atrial ablation using bipolar radiofrequency (convergent procedure)  2- Transesophageal  echocardiogram    Surgeon: [name], MD     . Atrial fibrillation, persistent 10/21/2016   . Chronic atrial fibrillation 10/08/2016     Added automatically from request for surgery [id number]     . Stage 4 chronic kidney disease 10/08/2016     Added automatically from request for surgery [id number]     . Persistent atrial fibrillation 10/07/2016   . Fatty liver 10/07/2016       1. Aftercare following surgery of the circulatory system    2. Atrial fibrillation, s/p hybrid ablation, type B dissection-patient appears to be doing very well today.  Vital signs stable.  Heart regular in rate and rhythm.  Denies chest pain or worsening shortness of breath.  Recent CTA chest shows stable type B dissection without changing dynamics.  Echocardiogram shows stable multi valvular disease, preserved LV function.  [name] advised he would like to continue monitoring as there has been no interval changes and patient is not having any attributable symptoms.  He did  discuss potential for TEVAR should there be first developments.  Patient verbalizes understanding and agreement with plan of care.  Opportunity given for question and answer.  [name] advised repeat CTA chest in late August with office follow-up.  Verbal and written aneurysm education given.  A copy of today's progress note will go to Dr. [name], Dr. [name]. Please do not hesitate to contact [name] or me with any questions or concerns. Office number [phone].        The orders placed during this visit, if any:  Orders Placed This Encounter   Procedures   . 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   The doctor found the aneurysm by looking at a CT scan or another  test.    An aneurysm is a bulge on a blood vessel. It happens when an artery (blood vessel) becomes abnormally large and balloons outward like a weak spot on an old tire. Your aneurysm is on your aorta. The aorta is the largest blood vessel in the body. It starts at the heart, goes through the chest and the diaphragm, and then enters the belly. It brings blood to all parts of the body, including the head, organs, arms and legs. Thoracic refers to the chest or the area inside the rib cage. Your aneurysm is called a thoracic aortic aneurysm because it is in your chest.    Most aneurysms occur in people over 60. About five percent of men over the age of 36 develop aneurysms. Men are more likely than women to develop aneurysms.  Aneurysms happen for different reasons, including the following:  Atherosclerosis (hardening of the arteries) causes about 80% percent of aneurysms. It causes weak spots that make blood vessels bulge.   High blood pressure (Hypertension) that is not well-controlled.   Smoking.   Rare genetic diseases (like Marfan's Syndrome or, Ehlers-Danlos Syndrome) that make the blood vessels weak.    Most people with aneurysms don't notice any symptoms. Sometimes, an aneurysm bursts, causing sudden severe pain and internal bleeding. This is a life-threatening condition, often leading to death within minutes.  Symptoms of a thoracic aneurysm include chest pain, upper back pain, and/or shortness of breath. If the aneurysm extends into the belly, you may also have pain in you belly too.    It is extremely important for you to take all medications as prescribed, especially those prescribed for blood pressure control.  If you smoke, STOP IMMEDIATELY.  High blood pressure and smoking are the leading causes of continued growth in your aneurysm.  You will probably need a CT scan or ultrasound every six months for the first year and then yearly thereafter if there is no significant change in the size or appearance of  your aneurysm.    YOU SHOULD SEEK MEDICAL ATTENTION IMMEDIATELY, EITHER HERE OR AT THE NEAREST EMERGENCY DEPARTMENT, IF ANY OF THE FOLLOWING OCCURS:  You develop severe pain in your back, chest or belly.   You have shortness of breath.   You have a fainting spell (you pass out).  Return in about 3 months (around 04/10/2018).       Signed by: Beverly Gust, NP    This documentation was completed utilizing [employer] Recognition Software.  Grammatical errors, random word insertions, pronoun errors and incomplete sentences are occasional consequences of this technology due to software limitations.  Not all errors are caught or corrected. Any questions or concerns about the content of this note or information contained within the body of this dictation should be addressed directly with the author for clarification.

## 2018-04-03 ENCOUNTER — Ambulatory Visit
Admission: RE | Admit: 2018-04-03 | Discharge: 2018-04-03 | Disposition: A | Discharge status: Home / Self Care | Payer: Medicare Other | Source: Ambulatory Visit | Attending: Nurse Practitioner | Admitting: Nurse Practitioner

## 2018-04-03 DIAGNOSIS — I712 Thoracic aortic aneurysm, without rupture: Secondary | ICD-10-CM | POA: Insufficient documentation

## 2018-04-03 DIAGNOSIS — I82891 Chronic embolism and thrombosis of other specified veins: Secondary | ICD-10-CM | POA: Insufficient documentation

## 2018-04-03 DIAGNOSIS — I71019 Dissection of thoracic aorta, unspecified: Secondary | ICD-10-CM

## 2018-04-03 DIAGNOSIS — Q231 Congenital insufficiency of aortic valve: Secondary | ICD-10-CM | POA: Insufficient documentation

## 2018-04-03 LAB — I-STAT CREATININE
Creatinine I-Stat: 1.1 mg/dL (ref 0.80–1.30)
EGFR: 69 mL/min/{1.73_m2} (ref 60–150)

## 2018-04-03 MED ORDER — IOHEXOL 350 MG/ML IV SOLN
100.0000 mL | Freq: Once | INTRAVENOUS | Status: AC | PRN
Start: 2018-04-03 — End: 2018-04-03
  Administered 2018-04-03: 11:00:00 100 mL via INTRAVENOUS
  Filled 2018-04-03: qty 100

## 2018-04-11 ENCOUNTER — Ambulatory Visit: Payer: Medicare Other | Attending: Specialist | Admitting: Specialist

## 2018-04-11 VITALS — BP 128/72 | HR 71 | Ht 67.0 in | Wt 167.6 lb

## 2018-04-11 DIAGNOSIS — Z09 Encounter for follow-up examination after completed treatment for conditions other than malignant neoplasm: Secondary | ICD-10-CM

## 2018-04-11 DIAGNOSIS — I71019 Dissection of thoracic aorta, unspecified: Secondary | ICD-10-CM

## 2018-04-11 DIAGNOSIS — I7101 Dissection of thoracic aorta: Secondary | ICD-10-CM

## 2018-04-11 NOTE — Progress Notes (Addendum)
PROGRESS NOTE    Date Time: 04/11/2018 2:16 PM  [name], [name]  Cardiologist: No att. providers found  Primary Care Physician: [name], MD    Attending Surgeon Attestation    Agree with note below. History and imaging studies reviewed.  Patient was seen and examined directly by me and physician assistant.    Doing well. Maintained NSR. Asymptomatic.  CTA essentially unchanged.  - Ongoing serial imaging.    [name], MD, [facility], [facility]  Cardiothoracic Surgery  Pager [phone]          History of Presenting Illness:   [name] is a 67 y.o. male who presents to the office with   Chief Complaint   Patient presents with   . Follow-up     CTA chest 04/03/18   cc: s/p hybrid afib ablation, type B dissection    PATIENT SEEN JOINTLY WITH [name]    [name] REVIEWED INFORMATION BELOW, DISCUSSED RELEVANT POINTS WITH PATIENTS AND PERSONALLY DEVELOPED PLAN OF CARE.       Mr. Inge presents for routine follow-up after hybrid atrial fibrillation ablation by [name] and Dr. [name] on 11/22/16. 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 CT at the time showed somewhat calcified dissection flap and was thought to not be an acute event. Patient has been followed closely as [name] has advised patient he may recommend TEVAR in the future should dissection dynamics change.  Patient enters today's appointment without complaint of chest pain or worsening DOE.  Reports he is very active.    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  There is been further thrombosis of the previously opacified area of the false lumen with only small channels remaining which appear to be from incoming lumbar or intercostal arteries.  Aortic arch and ascending aorta unremarkable.   No acute process or significant interval developments.    Previous echocardiogram from 12/08/2017 shows LV normal in size with moderate LVH.  EF 60 to 65%.  Left atrium severely dilated.  Right atrium severely dilated.  Mild mitral regurgitation.  Moderate tricuspid regurgitation.  Moderate aortic stenosis with peak velocity 3.1 m/s, mean pressure gradient 19 mmHg.  Mild to moderate aortic regurgitation.  Mild pulmonic regurgitation.    Past Medical History:     Past Medical History:   Diagnosis Date   . Arrhythmia    . Arthritis    . Atrial fibrillation    . Atrial flutter    . Bipolar affective    . BPH (benign prostatic hyperplasia)    . Complication of anesthesia     resp. asessment   . Diabetes mellitus    . Diabetic neuropathy    . Gout    . Heart murmur    . HOH (hard of hearing)    . Hyperlipidemia    . Hypertension    . Paroxysmal atrial fibrillation    . Pulmonary hypertension    . Renal insufficiency    . Type 2 diabetes mellitus, controlled    . Wears glasses        Past Surgical History:     Past Surgical History:   Procedure Laterality Date   . APPENDECTOMY     . CARDIAC ABLATION     . CARDIAC  CATHETERIZATION     . CARDIOVERSION      x 2   . FLUORO-NO CHARGE (HYBRID ROOM ONLY) N/A 11/22/2016    Procedure: FLUORO-NO CHARGE (HYBRID ROOM ONLY);  Surgeon: [name], MD;  [facility]: [facility] CATH/EP;  Service: Cardiovascular;  Laterality: N/A;   . HERNIA REPAIR     . PERICARDIOCENTESIS Left 11/25/2016    Procedure: PERICARDIOCENTESIS;  Surgeon: [name], MD;  Location: [facility] CATH/EP;  Service: Cardiovascular;  Laterality: Left;   . TEE     . TONSILLECTOMY     . TONSILLECTOMY, ADENOIDECTOMY         Family History:     Family History   Problem Relation Age of Onset   . Hypertension Mother    . Diabetes Mother    . Heart disease Mother    . Hypertension Sister    . Diabetes Sister    . Hyperlipidemia Sister    . Hypertension Brother    . Heart disease Brother    . Hyperlipidemia Brother    . Hypertension  Brother    . Diabetes Brother    . Hyperlipidemia Brother    . Kidney disease Brother    . Hypertension Daughter        Social History:     History   Smoking Status   . Former Smoker   . Packs/day: 0.30   . Years: 40.00   . Types: Cigarettes   . Quit date: 1995   Smokeless Tobacco   . Never Used     Comment: 1 pack would last a week     History   Alcohol Use No     History   Drug Use No       Allergies:     Allergies   Allergen Reactions   . Codeine Nausea And Vomiting       Medications:     Current Outpatient Prescriptions   Medication Sig Dispense Refill   . acetaminophen (TYLENOL) 500 MG tablet Take 500 mg by mouth every 6 (six) hours as needed for Pain.     Marland Kitchen allopurinol (ZYLOPRIM) 300 MG tablet Take 300 mg by mouth daily.     Marland Kitchen atenolol (TENORMIN) 100 MG tablet Take 100 mg by mouth 2 (two) times daily.     . divalproex EC/DR tablet (DEPAKOTE EC/DR) 250 MG EC tablet Take 500 mg by mouth 2 (two) times daily.     . furosemide (LASIX) 80 MG tablet Take 80 mg by mouth daily         . gabapentin (NEURONTIN) 100 MG capsule Take 100 mg by mouth 2 (two) times daily.     Marland Kitchen glimepiride (AMARYL) 1 MG tablet Take 0.5 mg by mouth every morning before breakfast.         . Loperamide HCl (IMODIUM PO) Take by mouth daily as needed.     . metFORMIN (GLUMETZA) 1000 MG (MOD) 24 hr tablet Take 1,000 mg by mouth 2 (two) times daily with meals         . minoxidil (LONITEN) 10 MG tablet Take 30 mg by mouth daily         . Multiple Vitamin (MULTIVITAMIN) capsule Take 1 capsule by mouth daily.     . naproxen (NAPROSYN) 250 MG tablet Take 250 mg by mouth 2 (two) times daily with meals     . olmesartan (BENICAR) 5 MG tablet Take 5 mg by mouth daily     . Phytonadione (K  100 PO) Take by mouth 2 (two) times daily         . pravastatin (PRAVACHOL) 40 MG tablet Take 40 mg by mouth every evening.     . rivaroxaban (XARELTO) 20 MG Tab Take 20 mg by mouth daily with dinner.     . SENNA CO by Combination route daily as needed.     . tamsulosin  (FLOMAX) 0.4 MG Cap Take 0.4 mg by mouth daily.     Marland Kitchen telmisartan (MICARDIS) 20 MG tablet Take 20 mg by mouth daily       No current facility-administered medications for this visit.         Review of Systems:   Review of Systems   Constitutional: Negative for fever, chills,  unexpected weight change.   HENT: Negative for trouble swallowing and voice change.    Respiratory: Per HPI.   Cardiovascular: Per HPI.   Gastrointestinal: Negative for nausea, vomiting, abdominal pain, diarrhea, constipation, or abdominal distention.   Genitourinary: Negative for dysuria, hematuria.  Skin: Negative for incisional redness or drainage.   Extremities: Positive for edema.       Physical Exam:     Vitals:    04/11/18 1406   BP: 128/72   Pulse: 71   SpO2: 94%     Wt Readings from Last 3 Encounters:   04/11/18 76 kg (167 lb 9.6 oz)   01/03/18 78.1 kg (172 lb 3.2 oz)   12/05/17 76.1 kg (167 lb 12.8 oz)     Body mass index is 26.25 kg/m.     General: no acute distress.  Pleasant affect and actively participates in conversation regarding his care.  Ambulatory.  Neuro: awake, alert, oriented x 3.   HEENT: eomi, sclera anicteric.  Cardiovascular: regular rate and rhythm, 3/6 flow murmur.  Lungs:  Clear to auscultation bilaterally without significant diminishment  Abdomen: non-distended  Extremities: 1+ bilateral LE edema.   Skin: pink, warm and dry      Assessment/Plan:     Patient Active Problem List    Diagnosis Date Noted   . Follow-up exam 01/26/2017   . 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 Chronic anticoagulation 11/25/2016   . Stroke 11/25/2016   . CKD (chronic kidney disease) 11/25/2016   . Former smoker, stopped smoking many years ago 11/25/2016   . Type 2 diabetes mellitus 11/25/2016   . HTN (hypertension) 11/25/2016   . HLD (hyperlipidemia) 11/25/2016   . Diabetic neuropathy 11/25/2016   . COPD (chronic obstructive pulmonary disease) 11/25/2016   . Bipolar disorder 11/25/2016   . BPH (benign prostatic hyperplasia)  11/25/2016   . Atrial fibrillation 11/22/2016     Date:11/22/2016    Pre-operative Diagnosis:  1- long-standing persistent atrial fibrillation  2- CKD  3- Reduced LVEF    Post-operative Diagnosis:same    Operation:   1- Hybrid left atrial ablation using bipolar radiofrequency (convergent procedure)  2- Transesophageal echocardiogram    Surgeon: [name], MD     . Atrial fibrillation, persistent 10/21/2016   . Chronic atrial fibrillation 10/08/2016     Added automatically from request for surgery [id number]     . Stage 4 chronic kidney disease 10/08/2016     Added automatically from request for surgery [id number]     . Persistent atrial fibrillation 10/07/2016   . Fatty liver 10/07/2016       1. Aftercare following surgery of the circulatory system    2. Atrial fibrillation, s/p hybrid ablation, type B dissection-patient  appears to be doing well today.  Recent imaging shows stable type B dissection without changing dynamics.  Denies chest pain or worsening shortness of breath.   Vital signs stable.  Heart regular in rate and rhythm.   [name] advised continued monitoring as there has been no interval changes and patient is not having any attributable symptoms.  The potential for TEVAR remains.  Patient verbalizes understanding and agreement with plan of care.  Opportunity given for question and answer.      [name] advised repeat CTA chest in 3 months. Need to follow tricuspid regurgitation, aortic stenosis/regurgitation yearly/prn with echo.    A copy of today's progress note will go to Dr. [name], Dr. [name]. Please do not hesitate to contact [name] or me with any questions or concerns. Office number [phone].        The orders placed during this visit, if any:  No orders of the defined types were placed in this encounter.       The patient was instructed as follows:  There are no Patient Instructions on file for this visit.       No Follow-up on file.       Signed by: Beverly Gust,  NP    This documentation was completed utilizing [employer] Software.  Grammatical errors, random word insertions, pronoun errors and incomplete sentences are occasional consequences of this technology due to software limitations.  Not all errors are caught or corrected. Any questions or concerns about the content of this note or information contained within the body of this dictation should be addressed directly with the author for clarification.

## 2018-07-11 ENCOUNTER — Ambulatory Visit
Admission: RE | Admit: 2018-07-11 | Discharge: 2018-07-11 | Disposition: A | Discharge status: Home / Self Care | Payer: No Typology Code available for payment source | Source: Ambulatory Visit | Attending: Nurse Practitioner | Admitting: Nurse Practitioner

## 2018-07-11 DIAGNOSIS — I71019 Dissection of thoracic aorta, unspecified: Secondary | ICD-10-CM

## 2018-07-11 DIAGNOSIS — Z09 Encounter for follow-up examination after completed treatment for conditions other than malignant neoplasm: Secondary | ICD-10-CM

## 2018-07-11 DIAGNOSIS — I7101 Dissection of thoracic aorta: Secondary | ICD-10-CM | POA: Insufficient documentation

## 2018-07-11 LAB — I-STAT CREATININE
Creatinine I-Stat: 1.2 mg/dL (ref 0.80–1.30)
EGFR: 61 mL/min/{1.73_m2} (ref 60–150)

## 2018-07-11 MED ORDER — IOHEXOL 350 MG/ML IV SOLN
100.00 mL | Freq: Once | INTRAVENOUS | Status: AC | PRN
Start: 2018-07-11 — End: 2018-07-11
  Administered 2018-07-11: 10:00:00 100 mL via INTRAVENOUS
  Filled 2018-07-11: qty 100

## 2018-07-13 ENCOUNTER — Encounter: Payer: Self-pay | Admitting: Specialist

## 2018-07-13 ENCOUNTER — Ambulatory Visit: Payer: No Typology Code available for payment source | Attending: Specialist | Admitting: Specialist

## 2018-07-13 VITALS — BP 136/72 | HR 82 | Wt 167.0 lb

## 2018-07-13 DIAGNOSIS — I71019 Dissection of thoracic aorta, unspecified: Secondary | ICD-10-CM

## 2018-07-13 DIAGNOSIS — I7101 Dissection of thoracic aorta: Secondary | ICD-10-CM

## 2018-07-13 DIAGNOSIS — Z09 Encounter for follow-up examination after completed treatment for conditions other than malignant neoplasm: Secondary | ICD-10-CM

## 2018-07-13 NOTE — Progress Notes (Signed)
PROGRESS NOTE    Date Time: 07/13/2018 12:03 PM  [name]  Cardiologist: No att. providers found  Primary Care Physician: [name], MD      History of Presenting Illness:   [name] is a 67 y.o. male who presents to the office with   No chief complaint on file.  cc: s/p hybrid afib ablation, type B dissection    PATIENT SEEN JOINTLY WITH [name]    [name] REVIEWED INFORMATION BELOW, DISCUSSED RELEVANT POINTS WITH PATIENTS AND PERSONALLY DEVELOPED PLAN OF CARE.       Mr. Broz presents for routine follow-up after hybrid atrial fibrillation ablation by [name] and Dr. [name] on 11/22/16. 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 CT at the time showed somewhat calcified dissection flap and was thought to not be an acute event. Patient has been followed closely as [name] has advised patient he may recommend TEVAR in the future should dissection dynamics change.  Patient enters today's appointment without complaint of chest pain or worsening DOE.  Reports he is very active.    Recent CTA chest on 07/11/2018 shows no difference in size of the descending aortic aneurysm when compared to imaging 3 months ago.      Previous echocardiogram from 12/08/2017 shows LV normal in size with moderate LVH.  EF 60 to 65%.  Left atrium severely dilated.  Right atrium severely dilated.  Mild mitral regurgitation.  Moderate tricuspid regurgitation.  Moderate aortic stenosis with peak velocity 3.1 m/s, mean pressure gradient 19 mmHg.  Mild to moderate aortic regurgitation.  Mild pulmonic regurgitation.      Procedure:                       CT Angiogram Chest  07/11/2018        Clinical History:  type B dissection surveillance    Examination:  Serial axial images were obtained through the thorax with unenhanced   timing bolus evaluation of the pulmonary artery, this was  then followed by   rapid infusion of IV contrast. Post enhanced images were sent to   workstation for 3-D reconstructions including   sagittal and coronal MIP acquisitions used to confirm axial data   findings.  Study was cardiac gated.  Contrast:  IOHEXOL 350 MG/ML IV SOLN/100 mL    CT images were acquired utilizing Automated Exposure Control for dose   reduction.     Comparison:  04/03/2018, 12/23/2017    Findings:  Again there is a type B dissection involving the mid and distal aspects of   the descending segment thoracic aorta. Dissection terminates at the level   the crus the diaphragm. There is no retrograde propagation of dissection.   The ascending and transverse   segments remain normal caliber without wall thickening or dissection.   Great vessels intact again there is type I bovine arch configuration.    The contour and caliber of the dissection involving the descending segment   is unchanged, allowing for differences in technique.. Maximal aneurysmal   dilatation involving the descending segment measures 45 x 39 mm. Mostly   thrombosed false lumen again noted   with slightly diminished amount of enhancement along the superior margin   of the false lumen likely related to technique, but possibly related to   interval thrombosis..    Aortic valve is densely calcified unchanged. No central pulmonary embolus.  Extensive coronary artery calcifications unchanged. Heart size mildly   enlarged but unchanged overall. Chronic scarring and bleb formation of the   lung parenchyma likely the basis   of COPD. Chronic degenerative changes thoracic spine without acute   fracture or suspicious osseous lesion. 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 The   visualized portion of the proximal SMA is   intact.    IMPRESSION:   1. Technical differences aside, no significant change and type B aortic   dissection with mostly thrombosed false lumen with stable  fusiform   aneurysmal dilatation as as above. (Again there is small area of   persistent perfusion of the aneurysm sac which   appears to have diminished since prior exam and may be related to partial   thrombosis versus technique). At subsequent follow-up, would encourage   delayed evaluation in addition to arterial phase evaluation.  2. Other chronic stable findings as above.            Past Medical History:     Past Medical History:   Diagnosis Date   . Arrhythmia    . Arthritis    . Atrial fibrillation    . Atrial flutter    . Bipolar affective    . BPH (benign prostatic hyperplasia)    . Complication of anesthesia     resp. asessment   . Diabetes mellitus    . Diabetic neuropathy    . Gout    . Heart murmur    . HOH (hard of hearing)    . Hyperlipidemia    . Hypertension    . Paroxysmal atrial fibrillation    . Pulmonary hypertension    . Renal insufficiency    . Type 2 diabetes mellitus, controlled    . Wears glasses        Past Surgical History:     Past Surgical History:   Procedure Laterality Date   . APPENDECTOMY     . CARDIAC ABLATION     . CARDIAC CATHETERIZATION     . CARDIOVERSION      x 2   . FLUORO-NO CHARGE (HYBRID ROOM ONLY) N/A 11/22/2016    Procedure: FLUORO-NO CHARGE (HYBRID ROOM ONLY);  Surgeon: [name], MD;  Location: [facility] CATH/EP;  Service: Cardiovascular;  Laterality: N/A;   . HERNIA REPAIR     . PERICARDIOCENTESIS Left 11/25/2016    Procedure: PERICARDIOCENTESIS;  Surgeon: [name], MD;  Location: [facility] CATH/EP;  Service: Cardiovascular;  Laterality: Left;   . TEE     . TONSILLECTOMY     . TONSILLECTOMY, ADENOIDECTOMY         Family History:     Family History   Problem Relation Age of Onset   . Hypertension Mother    . Diabetes Mother    . Heart disease Mother    . Hypertension Sister    . Diabetes Sister    . Hyperlipidemia Sister    . Hypertension Brother    . Heart disease Brother    . Hyperlipidemia Brother    . Hypertension Brother    . Diabetes Brother    .  Hyperlipidemia Brother    . Kidney disease Brother    . Hypertension Daughter        Social History:     Social History     Tobacco Use   Smoking Status Former Smoker   . Packs/day: 0.30   . Years:  40.00   . Pack years: 12.00   . Types: Cigarettes   . Last attempt to quit: 1995   . Years since quitting: 24.9   Smokeless Tobacco Never Used   Tobacco Comment    1 pack would last a week     Social History     Substance and Sexual Activity   Alcohol Use No     Social History     Substance and Sexual Activity   Drug Use No       Allergies:     Allergies   Allergen Reactions   . Codeine Nausea And Vomiting       Medications:     Current Outpatient Medications   Medication Sig Dispense Refill   . acetaminophen (TYLENOL) 500 MG tablet Take 500 mg by mouth every 6 (six) hours as needed for Pain.     Marland Kitchen allopurinol (ZYLOPRIM) 300 MG tablet Take 300 mg by mouth daily.     Marland Kitchen atenolol (TENORMIN) 100 MG tablet Take 100 mg by mouth 2 (two) times daily.     . divalproex EC/DR tablet (DEPAKOTE EC/DR) 250 MG EC tablet Take 500 mg by mouth 2 (two) times daily.     . furosemide (LASIX) 80 MG tablet Take 80 mg by mouth daily         . gabapentin (NEURONTIN) 100 MG capsule Take 100 mg by mouth 2 (two) times daily.     Marland Kitchen glimepiride (AMARYL) 1 MG tablet Take 0.5 mg by mouth every morning before breakfast.         . Loperamide HCl (IMODIUM PO) Take by mouth daily as needed.     . metFORMIN (GLUMETZA) 1000 MG (MOD) 24 hr tablet Take 1,000 mg by mouth 2 (two) times daily with meals         . minoxidil (LONITEN) 10 MG tablet Take 30 mg by mouth daily         . Multiple Vitamin (MULTIVITAMIN) capsule Take 1 capsule by mouth daily.     . naproxen (NAPROSYN) 250 MG tablet Take 250 mg by mouth 2 (two) times daily with meals     . olmesartan (BENICAR) 5 MG tablet Take 5 mg by mouth daily     . Phytonadione (K 100 PO) Take by mouth 2 (two) times daily         . pravastatin (PRAVACHOL) 40 MG tablet Take 40 mg by mouth every evening.     . rivaroxaban  (XARELTO) 20 MG Tab Take 20 mg by mouth daily with dinner.     . SENNA CO by Combination route daily as needed.     . tamsulosin (FLOMAX) 0.4 MG Cap Take 0.4 mg by mouth daily.     Marland Kitchen telmisartan (MICARDIS) 20 MG tablet Take 20 mg by mouth daily       No current facility-administered medications for this visit.         Review of Systems:   Review of Systems   Constitutional: Negative for fever, chills,  unexpected weight change.   HENT: Negative for trouble swallowing and voice change.    Respiratory: Per HPI.   Cardiovascular: Per HPI.   Gastrointestinal: Negative for nausea, vomiting, abdominal pain, diarrhea, constipation, or abdominal distention.   Genitourinary: Negative for dysuria, hematuria.  Skin: Negative for incisional redness or drainage.   Extremities: Positive for edema.       Physical Exam:     There were no vitals filed for this visit.  Wt Readings from Last 3 Encounters:   04/11/18 76 kg (167 lb 9.6 oz)   01/03/18 78.1 kg (172 lb 3.2 oz)   12/05/17 76.1 kg (167 lb 12.8 oz)     There is no height or weight on file to calculate BMI.     General: no acute distress.  Pleasant affect and actively participates in conversation regarding his care.  Ambulatory.  Neuro: awake, alert, oriented x 3.   HEENT: eomi, sclera anicteric.  Cardiovascular: regular rate and rhythm, 3/6 flow murmur.  Lungs:  Clear to auscultation bilaterally without significant diminishment  Abdomen: non-distended  Extremities: 1+ bilateral LE edema.   Skin: pink, warm and dry      Assessment/Plan:     Patient Active Problem List    Diagnosis Date Noted   . Follow-up exam 01/26/2017   . 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 Chronic anticoagulation 11/25/2016   . Stroke 11/25/2016   . CKD (chronic kidney disease) 11/25/2016   . Former smoker, stopped smoking many years ago 11/25/2016   . Type 2 diabetes mellitus 11/25/2016   . HTN (hypertension) 11/25/2016   . HLD (hyperlipidemia) 11/25/2016   . Diabetic neuropathy 11/25/2016   . COPD  (chronic obstructive pulmonary disease) 11/25/2016   . Bipolar disorder 11/25/2016   . BPH (benign prostatic hyperplasia) 11/25/2016   . Atrial fibrillation 11/22/2016     Date:11/22/2016    Pre-operative Diagnosis:  1- long-standing persistent atrial fibrillation  2- CKD  3- Reduced LVEF    Post-operative Diagnosis:same    Operation:   1- Hybrid left atrial ablation using bipolar radiofrequency (convergent procedure)  2- Transesophageal echocardiogram    Surgeon: [name], MD     . Atrial fibrillation, persistent 10/21/2016   . Chronic atrial fibrillation 10/08/2016     Added automatically from request for surgery [id number]     . Stage 4 chronic kidney disease 10/08/2016     Added automatically from request for surgery [id number]     . Persistent atrial fibrillation 10/07/2016   . Fatty liver 10/07/2016       1. Aftercare following surgery of the circulatory system    2. Atrial fibrillation, s/p hybrid ablation, type B dissection-patient appears to be doing well today.  Recent imaging shows stable type B dissection without changing dynamics.  Denies chest pain or worsening shortness of breath.   Vital signs stable.  Heart regular in rate and rhythm.   [name] advised continued monitoring as there has been no interval changes and patient is not having any attributable symptoms.  The potential for TEVAR remains.  Patient verbalizes understanding and agreement with plan of care.  Opportunity given for question and answer.      [name] advised repeat CTA chest in xxx. Need to follow tricuspid regurgitation, aortic stenosis/regurgitation yearly/prn with echo.    A copy of today's progress note will go to Dr. [name], Dr. [name]. Please do not hesitate to contact [name] or me with any questions or concerns. Office number [phone].        The orders placed during this visit, if any:  No orders of the defined types were placed in this encounter.       The patient was instructed as follows:  There  are no Patient Instructions on file for this visit.       No follow-ups on file.       Signed by: Beverly Gust, NP    This documentation was  completed utilizing [employer].  Grammatical errors, random word insertions, pronoun errors and incomplete sentences are occasional consequences of this technology due to software limitations.  Not all errors are caught or corrected. Any questions or concerns about the content of this note or information contained within the body of this dictation should be addressed directly with the author for clarification.

## 2018-07-13 NOTE — Progress Notes (Addendum)
PROGRESS NOTE    Date Time: 07/13/2018 2:31 PM  [name], [name]  Cardiologist: No att. providers found  Primary Care Physician: [name], MD    Attending Surgeon Attestation    Agree with note below. History and imaging studies reviewed.  Patient was seen and examined directly by me and physician assistant.    - Repeat CTA and echo in 6 months for TBAD monitoring    [name], MD, [facility], [facility]  Cardiothoracic Surgery  Pager [phone]        History of Presenting Illness:   [name] is a 67 y.o. male who presents to the office with   Chief Complaint   Patient presents with   . Follow-up     hybrid atrial fibrillation ablation by [name] and Dr. [name] on 11/22/16.    cc: s/p hybrid afib ablation, type B dissection    PATIENT SEEN JOINTLY WITH [name]    [name] REVIEWED INFORMATION BELOW, DISCUSSED RELEVANT POINTS WITH PATIENTS AND PERSONALLY DEVELOPED PLAN OF CARE.       [name] presents for routine follow-up after hybrid atrial fibrillation ablation by [name] and Dr. [name] on 11/22/16. 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 CT at the time showed somewhat calcified dissection flap and was thought to not be an acute event. Patient has been followed closely as [name] has advised patient he may recommend TEVAR in the future should dissection dynamics change.  Patient enters today's appointment without complaint of chest pain or worsening DOE.  Reports he is very active.    Recent CTA chest on 07/11/2018 shows no difference in size of the descending aortic aneurysm when compared to imaging 3 months ago.  45 X 39 mm    Previous echocardiogram from 12/08/2017 shows LV normal in size with moderate LVH.  EF 60 to 65%.  Left atrium severely dilated.  Right atrium severely dilated.  Mild mitral regurgitation.  Moderate tricuspid regurgitation.  Moderate aortic  stenosis with peak velocity 3.1 m/s, mean pressure gradient 19 mmHg.  Mild to moderate aortic regurgitation.  Mild pulmonic regurgitation.      Procedure:                       CT Angiogram Chest  07/11/2018        Clinical History:  type B dissection surveillance    Examination:  Serial axial images were obtained through the thorax with unenhanced   timing bolus evaluation of the pulmonary artery, this was then followed by   rapid infusion of IV contrast. Post enhanced images were sent to   workstation for 3-D reconstructions including   sagittal and coronal MIP acquisitions used to confirm axial data   findings.  Study was cardiac gated.  Contrast:  IOHEXOL 350 MG/ML IV SOLN/100 mL    CT images were acquired utilizing Automated Exposure Control for dose   reduction.     Comparison:  04/03/2018, 12/23/2017    Findings:  Again there is a type B dissection involving the mid and distal aspects of   the descending segment thoracic aorta. Dissection terminates at the level   the crus the diaphragm. There is no retrograde propagation of dissection.   The ascending and transverse   segments remain normal caliber without wall thickening or dissection.   Great vessels intact again there is type I bovine arch configuration.  The contour and caliber of the dissection involving the descending segment   is unchanged, allowing for differences in technique.. Maximal aneurysmal   dilatation involving the descending segment measures 45 x 39 mm. Mostly   thrombosed false lumen again noted   with slightly diminished amount of enhancement along the superior margin   of the false lumen likely related to technique, but possibly related to   interval thrombosis..    Aortic valve is densely calcified unchanged. No central pulmonary embolus.   Extensive coronary artery calcifications unchanged. Heart size mildly   enlarged but unchanged overall. Chronic scarring and bleb formation of the   lung parenchyma likely the basis   of  COPD. Chronic degenerative changes thoracic spine without acute   fracture or suspicious osseous lesion. 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 The   visualized portion of the proximal SMA is   intact.    IMPRESSION:   1. Technical differences aside, no significant change and type B aortic   dissection with mostly thrombosed false lumen with stable fusiform   aneurysmal dilatation as as above. (Again there is small area of   persistent perfusion of the aneurysm sac which   appears to have diminished since prior exam and may be related to partial   thrombosis versus technique). At subsequent follow-up, would encourage   delayed evaluation in addition to arterial phase evaluation.  2. Other chronic stable findings as above.            Past Medical History:     Past Medical History:   Diagnosis Date   . Arrhythmia    . Arthritis    . Atrial fibrillation    . Atrial flutter    . Bipolar affective    . BPH (benign prostatic hyperplasia)    . Complication of anesthesia     resp. asessment   . Diabetes mellitus    . Diabetic neuropathy    . Gout    . Heart murmur    . HOH (hard of hearing)    . Hyperlipidemia    . Hypertension    . Paroxysmal atrial fibrillation    . Pulmonary hypertension    . Renal insufficiency    . Type 2 diabetes mellitus, controlled    . Wears glasses        Past Surgical History:     Past Surgical History:   Procedure Laterality Date   . APPENDECTOMY     . CARDIAC ABLATION     . CARDIAC CATHETERIZATION     . CARDIOVERSION      x 2   . FLUORO-NO CHARGE (HYBRID ROOM ONLY) N/A 11/22/2016    Procedure: FLUORO-NO CHARGE (HYBRID ROOM ONLY);  Surgeon: [name], MD;  Location[facility] CATH/EP;  Service: Cardiovascular;  Laterality: N/A;   . HERNIA REPAIR     . PERICARDIOCENTESIS Left 11/25/2016    Procedure: PERICARDIOCENTESIS;  Surgeon: [name], MD;  Location: [facility] CATH/EP;  Service: Cardiovascular;  Laterality: Left;   . TEE     .  TONSILLECTOMY     . TONSILLECTOMY, ADENOIDECTOMY         Family History:     Family History   Problem Relation Age of Onset   . Hypertension Mother    . Diabetes Mother    . Heart disease Mother    . Hypertension Sister    . Diabetes Sister    . Hyperlipidemia  Sister    . Hypertension Brother    . Heart disease Brother    . Hyperlipidemia Brother    . Hypertension Brother    . Diabetes Brother    . Hyperlipidemia Brother    . Kidney disease Brother    . Hypertension Daughter        Social History:     Social History     Tobacco Use   Smoking Status Former Smoker   . Packs/day: 0.30   . Years: 40.00   . Pack years: 12.00   . Types: Cigarettes   . Last attempt to quit: 1995   . Years since quitting: 24.9   Smokeless Tobacco Never Used   Tobacco Comment    1 pack would last a week     Social History     Substance and Sexual Activity   Alcohol Use No     Social History     Substance and Sexual Activity   Drug Use No       Allergies:     Allergies   Allergen Reactions   . Codeine Nausea And Vomiting       Medications:     Current Outpatient Medications   Medication Sig Dispense Refill   . acetaminophen (TYLENOL) 500 MG tablet Take 500 mg by mouth every 6 (six) hours as needed for Pain.     Marland Kitchen allopurinol (ZYLOPRIM) 300 MG tablet Take 300 mg by mouth daily.     Marland Kitchen atenolol (TENORMIN) 100 MG tablet Take 100 mg by mouth 2 (two) times daily.     . divalproex EC/DR tablet (DEPAKOTE EC/DR) 250 MG EC tablet Take 500 mg by mouth 2 (two) times daily.     . furosemide (LASIX) 80 MG tablet Take 80 mg by mouth daily         . gabapentin (NEURONTIN) 100 MG capsule Take 100 mg by mouth 2 (two) times daily.     Marland Kitchen glimepiride (AMARYL) 1 MG tablet Take 0.5 mg by mouth every morning before breakfast.         . Loperamide HCl (IMODIUM PO) Take by mouth daily as needed.     . metFORMIN (GLUMETZA) 1000 MG (MOD) 24 hr tablet Take 1,000 mg by mouth 2 (two) times daily with meals         . minoxidil (LONITEN) 10 MG tablet Take 10 mg by mouth  daily May take up to 30mg   Qd if BP is elevated       . Multiple Vitamin (MULTIVITAMIN) capsule Take 1 capsule by mouth daily.     . naproxen (NAPROSYN) 250 MG tablet Take 250 mg by mouth 2 (two) times daily with meals     . olmesartan (BENICAR) 5 MG tablet Take 5 mg by mouth daily     . Phytonadione (K 100 PO) Take by mouth 2 (two) times daily         . pravastatin (PRAVACHOL) 40 MG tablet Take 40 mg by mouth every evening.     . rivaroxaban (XARELTO) 20 MG Tab Take 20 mg by mouth daily with dinner.     . SENNA CO by Combination route daily as needed.     . tamsulosin (FLOMAX) 0.4 MG Cap Take 0.4 mg by mouth daily.     Marland Kitchen telmisartan (MICARDIS) 20 MG tablet Take 20 mg by mouth daily       No current facility-administered medications for this visit.  Review of Systems:   Review of Systems   Constitutional: Negative for fever, chills,  unexpected weight change.   HENT: Negative for trouble swallowing and voice change.    Respiratory: Per HPI.   Cardiovascular: Per HPI.   Gastrointestinal: Negative for nausea, vomiting, abdominal pain, diarrhea, constipation, or abdominal distention.   Genitourinary: Negative for dysuria, hematuria.  Skin: Negative for incisional redness or drainage.   Extremities: Positive for edema.       Physical Exam:     Vitals:    07/13/18 1359   BP: 136/72   Pulse: 82   SpO2: 94%     Wt Readings from Last 3 Encounters:   07/13/18 75.8 kg (167 lb)   04/11/18 76 kg (167 lb 9.6 oz)   01/03/18 78.1 kg (172 lb 3.2 oz)     Body mass index is 26.16 kg/m.     General: no acute distress.  Pleasant affect and actively participates in conversation regarding his care.  Ambulatory.  Neuro: awake, alert, oriented x 3.   HEENT: eomi, sclera anicteric.  Cardiovascular: regular rate and rhythm, 3/6 flow murmur.  Lungs:  Clear to auscultation bilaterally without significant diminishment  Abdomen: non-distended  Extremities: 1+ bilateral LE edema.   Skin: pink, warm and dry      Assessment/Plan:   Stable  Type B dissection, without changing dynamics BP stable, continues in afib S/P convergent procedure last year, follows with Dr. [name] in January. Recommend follow up CTA chest in 6 months     1. Aftercare following surgery of the circulatory system    2. Atrial fibrillation, s/p hybrid ablation, type B dissection-patient appears to be doing well today.  Recent imaging shows stable type B dissection without changing dynamics.  Denies chest pain or worsening shortness of breath.   Vital signs stable.  Heart regular in rate and rhythm.   [name] advised continued monitoring as there has been no interval changes and patient is not having any attributable symptoms.  The potential for TEVAR remains.  Patient verbalizes understanding and agreement with plan of care.  Opportunity given for question and answer.      [name] advised repeat CTA chest in 6 months. Need to follow tricuspid regurgitation, aortic stenosis/regurgitation yearly/prn with echo.    A copy of today's progress note will go to Dr. [name], Dr. [name]. Please do not hesitate to contact [name] or me with any questions or concerns. Office number [phone].        The orders placed during this visit, if any: CTA chest 6 months  No orders of the defined types were placed in this encounter.       The patient was instructed as follows:continue to manage BP  There are no Patient Instructions on file for this visit.       Return follow up with .       Signed by: Rosanne Sack, NP    This documentation was completed utilizing [place]  Recognition Software.  Grammatical errors, random word insertions, pronoun errors and incomplete sentences are occasional consequences of this technology due to software limitations.  Not all errors are caught or corrected. Any questions or concerns about the content of this note or information contained within the body of this dictation should be addressed directly with the author for  clarification.

## 2018-07-31 NOTE — Progress Notes (Signed)
Cardiology Follow-Up      [name]   Date of Birth: [birth year]    Provider: [name], DO     Patient Care Team:  [name] Delphia Grates, MD as PCP - General (Family Medicine)  [name], DO as Consulting Physician (Cardiology)  [name], MD as Consulting Physician (Thoracic and Cardiac Surgery)    Chief Complaint: Atrial Fibrillation      History of Present Illness   Mr. [name] is a 67 y.o. male being seen today for atrial fibrillation.    She has a history of atrial fibrillation. He is s/p convergent AF ablation. He had a CT angiogram on 07/11/18, which demonstrated maximal abdominal aneurysmal dilatation involving the descending segment measures 45 x 39 mm.    Overall he states he is feeling well. He reports converting in and out of atrial fibrillation for about 6 weeks. He started amiodarone in October 2018. After looking at previous office notes, it does not seem he has been on amiodarone since 2018. He states he has still not been taking amiodarone due to not being able to get a refill. Although, he is compliant to taking Xarelto.    He recently saw [name] for type B aortic dissection, which he reports is stable.    Past Medical History     Hx 07/28/18  1.  Atrial fibrillation, new onset, 10/21/11, cardioverted and placed on sotalol.      a.  Cardioversion 10/22/11.       b.  Echo 10/22/11- moderate-severe concentric LVH. Biatrial enlargement. Mild AS, dilated inferior vena cava, small pericardial effusion. Peak velocity across the AV at 2.2  meters/s.      c.  Cardiac abltation 01/13/15.       d.  MRI Cardiac 03/16/16- LVEF 68.4%. Moderate septal wall hypertrophy (1.6cm) and mild posterior wall hypertrophy (1.3cm). Focal area of mid-myocardial wall delayed enhancement in basal anteroseptal wall and RV insertion point consistent with hypertensive heart disease. RVEF 41.7%. Severe LA enlargement and moderate RA enlargement. BAV with fusion of right and left  coronary cusps with leaflet thickening and restricted motion. Mild AS with peak velocity 2.15m/s. Mild central AR. Mild MR and TR. Small to moderate pericardial effusion. Mildly dilated ascending aorta. Aortic arch and descending thoracic aorta are ectatic. Trivial right pleural effusion. Dilated central pulm artery at 4.3cm.   E.  s/p TEE and [place] cardioversion 06/04/16  F. Convergent AF Ablation 11/22/16 - Procedure was complicated with pericardial effusion/ tamponade requiring pericardiocentesis. He developed left pleural effusion with thoracentesis.   G. Echo 12/03/16- No pericardial effusion, aortic valve is severely calcified with severely restricted systolic opening, mild regurgitation, LV is normal in size with moderate to severe concentric hypertrophy, systolic function is preserved with an EF 95-28%, diastolic dysfunction that couldn't be graded.   H. Echo 12/08/17- EF 60-65%. Moderate concentric LV hypertrophy. LA and RA severely dilated. Mild MR and moderate TR. Moderate valvular aortic stenosis. Mild to moderate AR and pulmonic valvular regurgitation. Mildly dilated ascending aorta 3.8-4.2 cm.   2. Aorta status  A. CTA Chest 07/11/18-  no significant change and type B aortic dissection with mostly thrombosed false lumen with stable fusiform aneurysmal dilatation. (Again there is small area of persistent perfusion of the aneurysm sac which appears to have diminished since prior exam and may be related to partial thrombosis versus technique). At subsequent follow-up, would encourage delayed evaluation in addition to arterial phase evaluation. Maximal aneurysmal dilatation involving  the descending segment measures 45 x 39 mm.   2.  Hypertension.  3.  Hyperlipidemia.   4.  Type I Diabetes Mellitus.  5.  Bipolar disorder.  6.  Fatty liver.  7.  Renal insufficiency.      a.  Normal renal ultrasound October 23, 2011.  Labs 07/11/18- GFR 61, CREAT 1.20    Allergies     is allergic to codeine.    Medications        Current Outpatient Medications:     acetaminophen (TYLENOL) 500 MG tablet, Take 500 mg by mouth every 6 (six) hours as needed for Pain., Disp: , Rfl:     allopurinol (ZYLOPRIM) 300 MG tablet, Take 300 mg by mouth daily., Disp: , Rfl:     atenolol (TENORMIN) 100 MG tablet, Take 100 mg by mouth 2 (two) times daily., Disp: , Rfl:     divalproex EC/DR tablet (DEPAKOTE EC/DR) 250 MG EC tablet, Take 500 mg by mouth 2 (two) times daily., Disp: , Rfl:     furosemide (LASIX) 80 MG tablet, Take 80 mg by mouth daily   , Disp: , Rfl:     gabapentin (NEURONTIN) 100 MG capsule, Take 100 mg by mouth 2 (two) times daily., Disp: , Rfl:     glimepiride (AMARYL) 1 MG tablet, Take 0.5 mg by mouth every morning before breakfast.  , Disp: , Rfl:     Loperamide HCl (IMODIUM PO), Take by mouth daily as needed., Disp: , Rfl:     metFORMIN (GLUCOPHAGE) 500 MG tablet, Take 500 mg by mouth 2 (two) times daily with meals, Disp: , Rfl:     minoxidil (LONITEN) 10 MG tablet, Take 10 mg by mouth daily May take up to 30mg   Qd if BP is elevated , Disp: , Rfl:     Multiple Vitamin (MULTIVITAMIN) capsule, Take 1 capsule by mouth daily., Disp: , Rfl:     olmesartan (BENICAR) 5 MG tablet, Take 5 mg by mouth daily, Disp: , Rfl:     Phytonadione (K 100 PO), Take by mouth 2 (two) times daily   , Disp: , Rfl:     pravastatin (PRAVACHOL) 40 MG tablet, Take 40 mg by mouth every evening., Disp: , Rfl:     rivaroxaban (XARELTO) 20 MG Tab, Take 20 mg by mouth daily with dinner., Disp: , Rfl:     SENNA CO, by Combination route daily as needed., Disp: , Rfl:     tamsulosin (FLOMAX) 0.4 MG Cap, Take 0.4 mg by mouth daily., Disp: , Rfl:     amiodarone (PACERONE) 200 MG tablet, Take 1 tablet (200 mg total) by mouth daily, Disp: 90 tablet, Rfl: 1    Review of Systems   Constitution: Negative for activity change, appetite change, fatigue, fever, and unexpected weight change  HENT: Negative for trouble swallowing, and voice change  Eyes: Negative  for any visual disturbances  Respiratory: Negative for apnea and/or awakening with sob, chest tightness, and sob  Cardiovascular: Negative for chest pain/discomfort, leg or ankle swelling, difficulty breathing lying down, and palpitations    Comprehensive review of systems performed by me. Unless otherwise noted, all systems are negative.      Physical Exam     Visit Vitals  BP (!) 142/102 (BP Site: Left arm, Patient Position: Sitting)   Pulse 89   Ht 1.702 m (5\' 7" )   Wt 77.7 kg (171 lb 6.4 oz)   BMI 26.85 kg/m     Vitals:  08/10/18 1114   BP: (!) 142/102   BP Site: Left arm   Patient Position: Sitting   Pulse: 89   Weight: 77.7 kg (171 lb 6.4 oz)   Height: 1.702 m (5\' 7" )     Wt Readings from Last 3 Encounters:   08/10/18 77.7 kg (171 lb 6.4 oz)   07/13/18 75.8 kg (167 lb)   04/11/18 76 kg (167 lb 9.6 oz)        Constitutional -  Well appearing, and in no distress  Respiratory - Clear to auscultation bilaterally, normal respiratory effort    Cardiovascular system -    Irregularly irregular.    Normal S1, S2    No murmurs, rubs, gallops   Jugular venous pulse is normal        Carotid upstroke normal, no carotid bruits auscultated   2+ pulses in the posterior tibial / dorsalis pedis bilaterally    Neurological - Alert, oriented, no focal neurological deficits  Extremities -  No clubbing or cyanosis. No peripheral edema    Labs     Lab Results   Component Value Date/Time    WBC 13.3 (H) 12/09/2016 07:10 AM    RBC 3.14 (L) 12/09/2016 07:10 AM    HGB 8.9 (L) 12/09/2016 07:10 AM    HCT 29.2 (L) 12/09/2016 07:10 AM    PLT 621 (H) 12/09/2016 07:10 AM    TSH 1.81 11/26/2016 03:02 AM       Lab Results   Component Value Date/Time    NA 135 (L) 12/09/2016 07:10 AM    NA 135 (L) 12/09/2016 07:10 AM    K 3.7 12/09/2016 07:10 AM    K 3.7 12/09/2016 07:10 AM    CL 96 (L) 12/09/2016 07:10 AM    CL 96 (L) 12/09/2016 07:10 AM    CO2 30.9 (H) 12/09/2016 07:10 AM    CO2 30.9 (H) 12/09/2016 07:10 AM    GLU 115 (H) 12/09/2016 07:10  AM    GLU 115 (H) 12/09/2016 07:10 AM    BUN 16 12/09/2016 07:10 AM    BUN 16 12/09/2016 07:10 AM    CREAT 1.20 07/11/2018 09:53 AM    CREAT 1.13 12/09/2016 07:10 AM    CREAT 1.13 12/09/2016 07:10 AM    PROT 6.5 12/09/2016 07:10 AM    ALKPHOS 117 12/09/2016 07:10 AM    AST 27 12/09/2016 07:10 AM    ALT 44 12/09/2016 07:10 AM       Lab Results   Component Value Date/Time    CHOL 70 (L) 11/26/2016 03:02 AM    TRIG 78 11/26/2016 03:02 AM    HDL 21 (L) 11/26/2016 03:02 AM    LDL 33 11/26/2016 03:02 AM       Lab Results   Component Value Date/Time    HGBA1CPERCNT 6.5 11/26/2016 03:02 AM     07/27/18- NA , K, CL 101, GLU 124, BUN 30, CREAT 30, CA 9.4, ALB 4.3, LFT is normal, GFR 63., A1C 6.8, CHOL 93, TRI 51, HDL 37, LDL 46  Cardiogenics:     EKG:  Atrial fibrillation with controlled ventricular response.    Impression and Plan:      1. Paroxysmal A-fib  [facility] - Cardioversion external; Future    2. Dissection of abdominal aorta    3. Nonrheumatic aortic (valve) stenosis    1. Paroxysmal atrial fibrillation. Restarted on amiodarone 200 mg qd. Schedule cardioversion in 2-3 weeks. F/U with [name] in 6-8 weeks.  2. 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 Chest CTA scheduled for June 2020.     3. Diabetes is managed by Dr. [name], his A1C 6.9%.    4. I will see him in 6 months.      This note was scribed by Zenda Alpers on behalf of [name], DO    Electronically signed by:   [name], DO  08/10/2018

## 2018-08-03 ENCOUNTER — Telehealth: Payer: Self-pay

## 2018-08-03 NOTE — Telephone Encounter (Signed)
Requested ov, labs from pcp

## 2018-08-04 NOTE — Telephone Encounter (Signed)
Requested records for 2nd attempt.

## 2018-08-07 NOTE — Telephone Encounter (Signed)
NOTES AND LABS RECEIVED AND SCANNED TO CHART

## 2018-08-10 ENCOUNTER — Ambulatory Visit: Payer: Medicare Other | Admitting: Internal Medicine

## 2018-08-10 ENCOUNTER — Encounter: Payer: Self-pay | Admitting: Internal Medicine

## 2018-08-10 VITALS — BP 142/102 | HR 89 | Ht 67.0 in | Wt 171.4 lb

## 2018-08-10 DIAGNOSIS — I480 Paroxysmal atrial fibrillation: Secondary | ICD-10-CM

## 2018-08-10 DIAGNOSIS — I7102 Dissection of abdominal aorta: Secondary | ICD-10-CM

## 2018-08-10 DIAGNOSIS — I350 Nonrheumatic aortic (valve) stenosis: Secondary | ICD-10-CM

## 2018-08-10 MED ORDER — AMIODARONE HCL 200 MG PO TABS
200.00 mg | ORAL_TABLET | Freq: Every day | ORAL | 1 refills | Status: DC
Start: 2018-08-10 — End: 2019-10-25

## 2018-08-30 ENCOUNTER — Ambulatory Visit
Admission: RE | Admit: 2018-08-30 | Discharge: 2018-08-30 | Disposition: A | Discharge status: Home / Self Care | Payer: No Typology Code available for payment source | Source: Ambulatory Visit | Attending: Internal Medicine | Admitting: Internal Medicine

## 2018-08-30 DIAGNOSIS — I480 Paroxysmal atrial fibrillation: Secondary | ICD-10-CM | POA: Insufficient documentation

## 2018-08-30 LAB — POTASSIUM: Potassium: 3.7 mMol/L (ref 3.5–5.3)

## 2018-08-30 LAB — MAGNESIUM: Magnesium: 2 mg/dL (ref 1.6–2.6)

## 2018-08-30 MED ORDER — MIDAZOLAM HCL 1 MG/ML IJ SOLN (WRAP)
INTRAMUSCULAR | Status: AC
Start: 2018-08-30 — End: ?
  Filled 2018-08-30: qty 4

## 2018-08-30 MED ORDER — VH FENTANYL CITRATE 100 MCG/2 ML (NARRATOR)
INTRAMUSCULAR | Status: AC | PRN
Start: 2018-08-30 — End: 2018-08-30
  Administered 2018-08-30: 50 ug via INTRAVENOUS

## 2018-08-30 MED ORDER — FENTANYL CITRATE (PF) 50 MCG/ML IJ SOLN (WRAP)
INTRAMUSCULAR | Status: AC
Start: 2018-08-30 — End: ?
  Filled 2018-08-30: qty 2

## 2018-08-30 MED ORDER — MIDAZOLAM HCL 1 MG/ML IJ SOLN (WRAP)
INTRAMUSCULAR | Status: AC | PRN
Start: 2018-08-30 — End: 2018-08-30
  Administered 2018-08-30: 1 mg via INTRAVENOUS

## 2018-08-30 MED ORDER — MIDAZOLAM HCL 1 MG/ML IJ SOLN (WRAP)
INTRAMUSCULAR | Status: AC | PRN
Start: 2018-08-30 — End: 2018-08-30
  Administered 2018-08-30: 2 mg via INTRAVENOUS

## 2018-08-30 NOTE — H&P (Signed)
Cardiology Admission History and Physical   Date Time: 08/30/18 9:06 AM  [name], [name]  MRN#: [id number]  DOB: September 30, 1950  PCP: [name], MD  Primary Cardiologist: [name], DO     Reason for Admission:   Atrial fibrillation     Assessment / Plan   1. Paroxysmal A-fib  [facility] - Cardioversion external; Future    2. Dissection of abdominal aorta    3. Nonrheumatic aortic (valve) stenosis    1. Paroxysmal atrial fibrillation. Restarted on amiodarone 200 mg qd. Schedule cardioversion in 2-3 weeks. F/U with [name] in 6-8 weeks.    2. 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 Chest CTA scheduled for June 2020.     3. Diabetes is managed by Dr. [name], his A1C 6.9%.      PLAN:  1. Cardioversion today    Consent Obtained for procedure.  [name], DO to assess patient prior to procedure. Questions about procedure answered and risks reviewed.     Previous Anesthetic or sedation adverse reactions: No    Time and Petra Kuba of Last Oral Intake:  Time of Last oral meal: 00:01 08/30/2018   No Liquid intake in the past 2 hours    ASA Physical Status Classification System:  Class 2 - Mild systemic disease, no functional limitations    Mallampati Airway classification: Class I: Full visibility of tonsils, uvula and soft palate    Anesthesia Plan: Moderate Sedation    Estimated Blood Loss: Minimal, <31mL.     History of Present Illness:   [name] is a 68 y.o. male with persistent atrial fibrillation presents for cardioversion. He repeat approximately 6 or 7 cardioversion historically. He is accompanied by his Son today. He is feeling well and denies any changes since last seeing Dr. [name] in clinic.       HPI per Dr. [name] 08/10/2018  Mr. [name] is a 68 y.o. male being seen today for atrial fibrillation.    She has a history of atrial fibrillation. He is s/p convergent AF ablation. He had a CT angiogram on 07/11/18, which  demonstrated maximal abdominal aneurysmal dilatation involving the descending segment measures 45 x 39 mm.    Overall he states he is feeling well. He reports converting in and out of atrial fibrillation for about 6 weeks. He started amiodarone in October 2018. After looking at previous office notes, it does not seem he has been on amiodarone since 2018. He states he has still not been taking amiodarone due to not being able to get a refill. Although, he is compliant to taking Xarelto.    He recently saw [name] for type B aortic dissection, which he reports is stable.  Problem List:   Active Problems:    * No active hospital problems. *    Past Medical History:     Past Medical History:   Diagnosis Date    Arrhythmia     Arthritis     Atrial fibrillation     Atrial flutter     Bipolar affective     BPH (benign prostatic hyperplasia)     Complication of anesthesia     resp. asessment    Diabetes mellitus     Diabetic neuropathy     Gout     Heart murmur     HOH (hard of hearing)     Hyperlipidemia     Hypertension     Paroxysmal atrial fibrillation  Pulmonary hypertension     Renal insufficiency     Type 2 diabetes mellitus, controlled     Wears glasses      Past Surgical History:     Past Surgical History:   Procedure Laterality Date    APPENDECTOMY      CARDIAC ABLATION      CARDIAC CATHETERIZATION      CARDIOVERSION      x 2    FLUORO-NO CHARGE (HYBRID ROOM ONLY) N/A 11/22/2016    Procedure: FLUORO-NO CHARGE (HYBRID ROOM ONLY);  Surgeon: [name], MD;  Location: [facility] CATH/EP;  Service: Cardiovascular;  Laterality: N/A;    HERNIA REPAIR      PERICARDIOCENTESIS Left 11/25/2016    Procedure: PERICARDIOCENTESIS;  Surgeon: [name], MD;  Location: [facility] CATH/EP;  Service: Cardiovascular;  Laterality: Left;    TEE      TONSILLECTOMY      TONSILLECTOMY, ADENOIDECTOMY       Family History:     Family History   Problem Relation Age of Onset    Hypertension Mother      Diabetes Mother     Heart disease Mother     Hypertension Sister     Diabetes Sister     Hyperlipidemia Sister     Hypertension Brother     Heart disease Brother     Hyperlipidemia Brother     Hypertension Brother     Diabetes Brother     Hyperlipidemia Brother     Kidney disease Brother     Hypertension Daughter      Social History:     Social History     Socioeconomic History    Marital status: Widowed     Spouse name: Not on file    Number of children: Not on file    Years of education: Not on file    Highest education level: Not on file   Occupational History    Not on file   Social Needs    Financial resource strain: Not on file    Food insecurity:     Worry: Not on file     Inability: Not on file    Transportation needs:     Medical: Not on file     Non-medical: Not on file   Tobacco Use    Smoking status: Former Smoker     Packs/day: 0.30     Years: 40.00     Pack years: 12.00     Types: Cigarettes     Last attempt to quit: 1995     Years since quitting: 25.0    Smokeless tobacco: Never Used   Substance and Sexual Activity    Alcohol use: No    Drug use: No    Sexual activity: Not on file   Lifestyle    Physical activity:     Days per week: Not on file     Minutes per session: Not on file    Stress: Not on file   Relationships    Social connections:     Talks on phone: Not on file     Gets together: Not on file     Attends religious service: Not on file     Active member of club or organization: Not on file     Attends meetings of clubs or organizations: Not on file     Relationship status: Not on file    Intimate partner violence:     Fear of current  or ex partner: Not on file     Emotionally abused: Not on file     Physically abused: Not on file     Forced sexual activity: Not on file   Other Topics Concern    Not on file   Social History Narrative    Not on file     Allergies:     Allergies   Allergen Reactions    Codeine Nausea And Vomiting     Medications:   Home Meds:   Home Medications     Med List Status:  Complete Set By: Lauralyn Primes, RN at 08/30/2018  8:08 AM                acetaminophen (TYLENOL) 500 MG tablet     Take 500 mg by mouth every 6 (six) hours as needed for Pain.     allopurinol (ZYLOPRIM) 300 MG tablet     Take 300 mg by mouth daily.     amiodarone (PACERONE) 200 MG tablet     Take 1 tablet (200 mg total) by mouth daily     atenolol (TENORMIN) 100 MG tablet     Take 100 mg by mouth 2 (two) times daily.     divalproex EC/DR tablet (DEPAKOTE EC/DR) 250 MG EC tablet     Take 500 mg by mouth 2 (two) times daily.     furosemide (LASIX) 80 MG tablet     Take 80 mg by mouth daily         gabapentin (NEURONTIN) 100 MG capsule     Take 100 mg by mouth 2 (two) times daily.     glimepiride (AMARYL) 1 MG tablet     Take 0.5 mg by mouth every morning before breakfast.         Loperamide HCl (IMODIUM PO)     Take by mouth daily as needed.     metFORMIN (GLUCOPHAGE) 500 MG tablet     Take 500 mg by mouth 2 (two) times daily with meals     minoxidil (LONITEN) 10 MG tablet     Take 10 mg by mouth daily May take up to 30mg   Qd if BP is elevated       Multiple Vitamin (MULTIVITAMIN) capsule     Take 1 capsule by mouth daily.     olmesartan (BENICAR) 5 MG tablet     Take 5 mg by mouth daily     Phytonadione (K 100 PO)     Take by mouth 2 (two) times daily         pravastatin (PRAVACHOL) 40 MG tablet     Take 40 mg by mouth every evening.     rivaroxaban (XARELTO) 20 MG Tab     Take 20 mg by mouth daily with dinner.     SENNA CO     by Combination route daily as needed.     tamsulosin (FLOMAX) 0.4 MG Cap     Take 0.4 mg by mouth daily.        Review of Systems:   A comprehensive review of systems was: Otherwise negative except as stated above.    Physical Exam:     Visit Vitals  BP (!) 151/95   Pulse 68   Resp 15   Ht 1.702 m (5\' 7" )   Wt 76.2 kg (168 lb)   SpO2 97%   BMI 26.31 kg/m     Wt Readings from Last 3 Encounters:   08/30/18  76.2 kg (168 lb)   08/10/18 77.7 kg (171  lb 6.4 oz)   07/13/18 75.8 kg (167 lb)      Physical Exam  EKG:   EKG:  AF on telemetry monitor rat controlled  Labs Reviewed:     Results     Procedure Component Value Units Date/Time    Magnesium [[id number]] Collected:  08/30/18 0810    Specimen:  Plasma Updated:  08/30/18 0846     Magnesium 2.0 mg/dL     Potassium [[id number]] Collected:  08/30/18 0810    Specimen:  Plasma Updated:  08/30/18 0846     Potassium 3.7 mMol/L           Signed by: Venancio Poisson, NP     Note: This chart was generated by the [facility] EMR system/speech recognition and may contain inherit omission or errors not intended by the user.  Grammatical errors, random word insertions, deletions, pronoun errors and incomplete sentences are occasionally consequences of this technology due to software limitations.  Not all errors are caught or corrected.  If there are questions or concerns about the content of this note or information contained in the body of this dictation they should be addressed directly with the author for clarification.

## 2018-08-30 NOTE — Sedation Documentation (Signed)
200 joules sync afib to sr

## 2018-08-30 NOTE — Procedures (Signed)
Cardioversion note    Date Time: 08/30/18 9:28 AM  [name]  Requesting Physician: [name], DO    Indication:   Atrial fibrillation flutter, persistent, symptomatic.      Referring physician: Dr. [name]    Procedure: Electrical [place] cardioversion      Current anticoagulation: Xarelto    ASA score: 2  Mallampati score: 1    Moderate conscious Sedation:  Moderate conscious sedation was performed. A preprocedural assessment of the patient was completed including review of past medical/surgical history, previous anesthesia sedation experience and family history. Patient's medications and drug allergies were reviewed. Patient examined with vital signs reviewed.  Incremental amounts of medications given to attain moderate level of consciousness.There was continuous face-to-face attendance by myself with trained nurse monitoring patient's consciousness and physiologic status throughout the procedure. Total direct time for supervision and monitoring of conscious sedation: 21 minutes.    After obtaining informed consent, patient was given conscious sedation with   Versed - 3 mg and Fentanyl - 100 mcg IV.    Patient was monitored with continuous pulse oxymetry and cardiac monitoring during the procedure. Synchronized cardioversion with 200J.  Cardioversion was successful.  No immediate complications.    Complications:  None    Conclusion:    Cardioversion was successful.    Consuello Bossier, MD

## 2018-08-30 NOTE — Discharge Instructions (Signed)
Instructions due to Conscious Sedation:    Do NOT participate in activities where you could become injured for the next 24 hours because you will feel tired the remainder of the day. For example do not: drive, operate heavy machinery, cook, or use power tools.    Do not make important decisions or sign legal documents for the next 24 hours.    Only take over-the-counter or prescription medications for pain, discomfort or fever, as directed.  If pain medications have been prescribed for you, ask your healthcare team how soon it is safe to take.    You should not drink alcohol, take sleeping pills, or medications that cause drowsiness for 24 hours.    We recommend that you go home after discharge and have a caregiver with you overnight.    Managing nausea:    Some people have an upset stomach after sedation. This is often because of anesthesia, pain, or pain medicine, or the stress of surgery. These tips will help you handle nausea and eat healthy foods as you get better. If you were on a special food plan before surgery, ask your healthcare provider if you should follow it while you get better. These tips may help:  Do not push yourself to eat. Your body will tell you when to eat and how much.  Start off with clear liquids and soup. They are easier to digest.  Next try semi-solid foods, such as mashed potatoes, applesauce, and gelatin, as you feel ready.  Slowly move to solid foods. Don't eat fatty, rich, or spicy foods at first.  Do not force yourself to have 3 large meals a day. Instead eat smaller amounts more often.  Take pain medicines with a small amount of solid food, such as crackers or toast, to avoid nausea.  Instructions due to Conscious Sedation:    Do NOT participate in activities where you could become injured for the next 24 hours because you will feel tired the remainder of the day. For example do not: drive, operate heavy machinery, cook, or use power tools.    Do not make important decisions or sign  legal documents for the next 24 hours.    Only take over-the-counter or prescription medications for pain, discomfort or fever, as directed.  If pain medications have been prescribed for you, ask your healthcare team how soon it is safe to take.    You should not drink alcohol, take sleeping pills, or medications that cause drowsiness for 24 hours.    We recommend that you go home after discharge and have a caregiver with you overnight.    Managing nausea:    Some people have an upset stomach after sedation. This is often because of anesthesia, pain, or pain medicine, or the stress of surgery. These tips will help you handle nausea and eat healthy foods as you get better. If you were on a special food plan before surgery, ask your healthcare provider if you should follow it while you get better. These tips may help:  Do not push yourself to eat. Your body will tell you when to eat and how much.  Start off with clear liquids and soup. They are easier to digest.  Next try semi-solid foods, such as mashed potatoes, applesauce, and gelatin, as you feel ready.  Slowly move to solid foods. Don't eat fatty, rich, or spicy foods at first.  Do not force yourself to have 3 large meals a day. Instead eat smaller amounts more often.  Take pain medicines with a small amount of solid food, such as crackers or toast, to avoid nausea.

## 2018-08-30 NOTE — [facility] Notes (Signed)
Discharge instructions given to patient, verbalized understanding. To home with family.

## 2018-08-30 NOTE — Sedation Documentation (Signed)
Pt educated on CV.  Understanding stated.  No concerns expressed.

## 2018-09-06 LAB — ECG 12-LEAD
P Wave Axis: 109 deg
P-R Interval: 229 ms
Patient Age: 67 years
Q-T Interval(Corrected): 480 ms
Q-T Interval: 497 ms
QRS Axis: 18 deg
QRS Duration: 94 ms
T Axis: 59 years
Ventricular Rate: 56 //min

## 2018-09-20 ENCOUNTER — Encounter (INDEPENDENT_AMBULATORY_CARE_PROVIDER_SITE_OTHER): Payer: Self-pay

## 2018-10-24 ENCOUNTER — Telehealth: Payer: Self-pay

## 2018-10-24 NOTE — Telephone Encounter (Signed)
Called patient to go over appt for tomorrow.     -CStewart, CNA.

## 2018-10-25 ENCOUNTER — Telehealth: Payer: Self-pay

## 2018-10-25 ENCOUNTER — Encounter: Payer: Self-pay | Admitting: Medical

## 2018-10-25 ENCOUNTER — Ambulatory Visit: Payer: No Typology Code available for payment source | Admitting: Medical

## 2018-10-25 VITALS — BP 140/88 | HR 62 | Ht 67.0 in | Wt 168.7 lb

## 2018-10-25 DIAGNOSIS — Z79899 Other long term (current) drug therapy: Secondary | ICD-10-CM

## 2018-10-25 DIAGNOSIS — I350 Nonrheumatic aortic (valve) stenosis: Secondary | ICD-10-CM

## 2018-10-25 DIAGNOSIS — I480 Paroxysmal atrial fibrillation: Secondary | ICD-10-CM

## 2018-10-25 DIAGNOSIS — I71019 Dissection of thoracic aorta, unspecified: Secondary | ICD-10-CM

## 2018-10-25 MED ORDER — AMIODARONE HCL 200 MG PO TABS
200.0000 mg | ORAL_TABLET | Freq: Every day | ORAL | 3 refills | Status: DC
Start: 2018-10-25 — End: 2019-01-16

## 2018-10-25 NOTE — [facility] Notes (Signed)
Cardiology Follow-Up      [name]   Date of Birth: 1950/08/19    Provider: Linden Dolin, PA     Patient Care Team:  [name], MD as PCP - General (Family Medicine)  [name], DO as Consulting Physician (Cardiology)  [name], MD as Consulting Physician (Thoracic and Cardiac Surgery)    Chief Complaint: Atrial Fibrillation      History of Present Illness   Mr. [name] is a 68 y.o. male being seen today for follow up.     She has a history of atrial fibrillation. He is s/p convergent AF ablation.  He was on amiodarone but this was inadvertently stopped because his prescription ran out.  At his f/u appointment in January, he was found to be back in atrial fib. He was restarted on amiodarone and scheduled for cardioversion.     He had a CT angiogram on 07/11/18, which demonstrated maximal abdominal aneurysmal dilatation involving the descending segment measures 45 x 39 mm.  He saw [name] for type B aortic dissection, which he reports is stable.  Repeat Chest CTA scheduled for June 2020.    He had cardioversion 08/30/18. This was successful and he has been maintaining SR. He feels much better.     LFTs were done in 07/2018. No recent TSH. Last PFts were before his ablation.    He is now working with [employer].     Review of Systems     Comprehensive review of systems performed.Other than noted in HPI , all systems are negative.    Past Medical History   Past Medical History 10/25/2018 LLB-     1. Atrial fibrillation  A. Cardioversion 10/22/11.   B. Echo 10/22/11- moderate-severe concentric LVH. Biatrial enlargement. Mild AS, dilated inferior vena cava, small pericardial effusion. Peak velocity across the AV at 2.2 meters/s.  C. Cardiac abltation 01/13/15.   D. MRI Cardiac 03/16/16- LVEF 68.4%. Moderate septal wall hypertrophy (1.6cm) and mild posterior wall hypertrophy (1.3cm). Focal area of mid-myocardial wall delayed enhancement in basal anteroseptal wall and  RV insertion point consistent with hypertensive heart disease. RVEF 41.7%. Severe LA enlargement and moderate RA enlargement. 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 Mild AS with peak velocity 2.7m/s. Mild central AR. Mild MR and TR. Small to moderate pericardial effusion. Mildly dilated ascending aorta. Aortic arch and descending thoracic aorta are ectatic. Trivial right pleural effusion. Dilated central pulm artery at 4.3cm.   E. s/p TEE and [place] cardioversion 06/04/16  F. Convergent AF Ablation 11/22/16 - Procedure was complicated with pericardial effusion/ tamponade requiring pericardiocentesis. He developed left pleural effusion with thoracentesis.   G. Echo 12/03/16- No pericardial effusion, aortic valve is severely calcified with severely restricted systolic opening, mild regurgitation, LV is normal in size with moderate to severe concentric hypertrophy, systolic function is preserved with an EF 82-50%, diastolic dysfunction that couldn't be graded.   H. Echo 12/08/17- EF 60-65%. Moderate concentric LV hypertrophy. LA and RA severely dilated. Mild MR and moderate TR. Moderate valvular aortic stenosis. Mild to moderate AR and pulmonic valvular regurgitation. Mildly dilated ascending aorta 3.8-4.2 cm.    2. Aorta status  A. 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 (Again there is small area of persistent perfusion of the aneurysm sac which appears to have diminished since prior exam and  may be related to partial thrombosis versus technique). At subsequent follow-up, would encourage delayed evaluation in addition to arterial phase evaluation. Maximal aneurysmal dilatation involving the descending segment measures 45 x 39 mm.     3. Hypertension.  4. Hyperlipidemia.   5. Type I Diabetes Mellitus.  6. Bipolar disorder.  7. Fatty liver.  8. Renal  insufficiency.  A. Normal renal ultrasound October 23, 2011.  9. Basilar Airway disease    Past Surgical History    has a past surgical history that includes Appendectomy; Hernia repair; TONSILLECTOMY, ADENOIDECTOMY; CARDIAC ABLATION; Cardioversion; TEE; Cardiac catheterization; Tonsillectomy; Pericardiocentesis (Left, 11/25/2016); and FLUORO-NO CHARGE (N/A, 11/22/2016).  Family History   family history includes Diabetes in his brother, mother, and sister; Heart disease in his brother and mother; Hyperlipidemia in his brother, brother, and sister; Hypertension in his brother, brother, daughter, mother, and sister; Kidney disease in his brother.  Social History    reports that he quit smoking about 25 years ago. His smoking use included cigarettes. He has a 12.00 pack-year smoking history. He has never used smokeless tobacco. He reports that he does not drink alcohol or use drugs.  Allergies   is allergic to codeine.  Medications     Current Outpatient Medications:   .  acetaminophen (TYLENOL) 500 MG tablet, Take 500 mg by mouth every 6 (six) hours as needed for Pain., Disp: , Rfl:   .  allopurinol (ZYLOPRIM) 300 MG tablet, Take 300 mg by mouth daily., Disp: , Rfl:   .  amiodarone (PACERONE) 200 MG tablet, Take 1 tablet (200 mg total) by mouth daily, Disp: 90 tablet, Rfl: 1  .  atenolol (TENORMIN) 100 MG tablet, Take 100 mg by mouth 2 (two) times daily., Disp: , Rfl:   .  divalproex EC/DR tablet (DEPAKOTE EC/DR) 250 MG EC tablet, Take 500 mg by mouth 2 (two) times daily., Disp: , Rfl:   .  furosemide (LASIX) 80 MG tablet, Take 80 mg by mouth daily   , Disp: , Rfl:   .  gabapentin (NEURONTIN) 100 MG capsule, Take 100 mg by mouth 2 (two) times daily., Disp: , Rfl:   .  glimepiride (AMARYL) 1 MG tablet, Take 0.5 mg by mouth every morning before breakfast.  , Disp: , Rfl:   .  Loperamide HCl (IMODIUM PO), Take by mouth daily as needed., Disp: , Rfl:   .  metFORMIN (GLUCOPHAGE) 500 MG tablet, Take 1,000 mg by mouth 2 (two)  times daily with meals  , Disp: , Rfl:   .  minoxidil (LONITEN) 10 MG tablet, Take 10 mg by mouth daily May take up to 30mg   Qd if BP is elevated , Disp: , Rfl:   .  Multiple Vitamin (MULTIVITAMIN) capsule, Take 1 capsule by mouth daily., Disp: , Rfl:   .  olmesartan (BENICAR) 5 MG tablet, Take 5 mg by mouth daily, Disp: , Rfl:   .  Phytonadione (K 100 PO), Take by mouth 2 (two) times daily   , Disp: , Rfl:   .  pravastatin (PRAVACHOL) 40 MG tablet, Take 40 mg by mouth every evening., Disp: , Rfl:   .  rivaroxaban (XARELTO) 20 MG Tab, Take 20 mg by mouth daily with dinner., Disp: , Rfl:   .  SENNA CO, by Combination route daily as needed., Disp: , Rfl:   .  tamsulosin (FLOMAX) 0.4 MG Cap, Take 0.4 mg by mouth daily., Disp: , Rfl:   .  amiodarone (PACERONE) 200 MG  tablet, Take 1 tablet (200 mg total) by mouth daily, Disp: 90 tablet, Rfl: 3  Physical Exam     Visit Vitals  BP 140/88 (BP Site: Left arm, Patient Position: Sitting)   Pulse 62   Ht 1.702 m (5\' 7" )   Wt 76.5 kg (168 lb 11.2 oz)   BMI 26.42 kg/m     Wt Readings from Last 3 Encounters:   10/25/18 76.5 kg (168 lb 11.2 oz)   08/30/18 76.2 kg (168 lb)   08/10/18 77.7 kg (171 lb 6.4 oz)      General appearance - alert, well appearing, and in no distress  Neck - not examined  Chest -clear to auscultation, no wheezes, rales or rhonchi, symmetric air entry  Heart - normal rate and regular rhythm, 2/6 SEM  Extremities - no pedal edema noted  Neuro: alert   Skin: no rashes  Psych: normal affect    Labs     Lab Results   Component Value Date/Time    WBC 13.3 (H) 12/09/2016 07:10 AM    RBC 3.14 (L) 12/09/2016 07:10 AM    HGB 8.9 (L) 12/09/2016 07:10 AM    HCT 29.2 (L) 12/09/2016 07:10 AM    PLT 621 (H) 12/09/2016 07:10 AM    TSH 1.81 11/26/2016 03:02 AM     Lab Results   Component Value Date/Time    NA 135 (L) 12/09/2016 07:10 AM    NA 135 (L) 12/09/2016 07:10 AM    K 3.7 08/30/2018 08:10 AM    CL 96 (L) 12/09/2016 07:10 AM    CL 96 (L) 12/09/2016 07:10 AM    CO2 30.9  (H) 12/09/2016 07:10 AM    CO2 30.9 (H) 12/09/2016 07:10 AM    GLU 115 (H) 12/09/2016 07:10 AM    GLU 115 (H) 12/09/2016 07:10 AM    BUN 16 12/09/2016 07:10 AM    BUN 16 12/09/2016 07:10 AM    CREAT 1.20 07/11/2018 09:53 AM    CREAT 1.13 12/09/2016 07:10 AM    CREAT 1.13 12/09/2016 07:10 AM    PROT 6.5 12/09/2016 07:10 AM    ALKPHOS 117 12/09/2016 07:10 AM    AST 27 12/09/2016 07:10 AM    ALT 44 12/09/2016 07:10 AM     Lab Results   Component Value Date/Time    CHOL 70 (L) 11/26/2016 03:02 AM    TRIG 78 11/26/2016 03:02 AM    HDL 21 (L) 11/26/2016 03:02 AM    LDL 33 11/26/2016 03:02 AM     Lab Results   Component Value Date/Time    HGBA1CPERCNT 6.5 11/26/2016 03:02 AM     Lab Results   Component Value Date/Time    BNP 1,041.5 (H) 11/15/2016 11:10 AM     EKG:   (personally reviewed by myself):  NSR 62    Impression and Recommendations:     1. PAF (paroxysmal atrial fibrillation)   s/p convergent AF ablation with recurrent AF and CV 1/20.  He is maintaining SR. Continue amiodarone and xarelto.     2. On amiodarone therapy  Will arrange for PFTs, TSH and CMP    3. Aortic dissection, thoracic  Keep f/u with Dr Kathrin Ruddy    4. Aortic valve stenosis, etiology of cardiac valve disease unspecified  Repeat echo before next visit    Return in about 4 months (around 02/24/2019).      Electronically signed by: Linden Dolin, PA 10/25/2018

## 2018-10-25 NOTE — Telephone Encounter (Signed)
Referral sent to [facility].  [name]

## 2018-10-27 ENCOUNTER — Ambulatory Visit
Admission: RE | Admit: 2018-10-27 | Discharge: 2018-10-27 | Disposition: A | Discharge status: Home / Self Care | Payer: No Typology Code available for payment source | Source: Ambulatory Visit | Attending: Medical | Admitting: Medical

## 2018-10-27 DIAGNOSIS — Z87891 Personal history of nicotine dependence: Secondary | ICD-10-CM | POA: Insufficient documentation

## 2018-10-27 DIAGNOSIS — Z79899 Other long term (current) drug therapy: Secondary | ICD-10-CM | POA: Insufficient documentation

## 2018-10-27 DIAGNOSIS — I480 Paroxysmal atrial fibrillation: Secondary | ICD-10-CM | POA: Insufficient documentation

## 2018-10-27 LAB — PULMONARY FUNCTION TEST
DL/VA- %Pred: 109 ml/min/mmHg/L
DL/VA- Actual: 4.05 ml/min/mmHg/L
DL/VA- Pred: 3.69 ml/min/mmHg/L
DLCOunc- %Pred: 76 ml/min/mmHg
DLCOunc- Actual: 18.17 ml/min/mmHg
DLCOunc- Pred: 23.69 ml/min/mmHg
ERV (Pre-Bronch) %Pred: 5 L
ERV (Pre-Bronch) Actual: 0.07 L
ERV (Pre-Bronch) Pred: 1.1 L
FEF 25% (Pre-Bronch) Pred: 6.64 L/sec
FEF 25-75% (Post-Bronch) %Chng: 12 L/sec
FEF 25-75% (Post-Bronch) %Pred: 73 L/sec
FEF 25-75% (Post-Bronch) Actual: 1.7 L/sec
FEF 25-75% (Pre-Bronch) %Pred: 65 L/sec
FEF 25-75% (Pre-Bronch) Actual: 1.5 L/sec
FEF 25-75% (Pre-Bronch) Pred: 2.31 L/sec
FEF 75% (Pre-Bronch) Pred: 0.62 L/sec
FEF Max (Post-Bronch) %Chng: 18 L/sec
FEF Max (Post-Bronch) %Pred: 82 L/sec
FEF Max (Post-Bronch) Actual: 6.39 L/sec
FEF Max (Pre-Bronch) %Pred: 69 L/sec
FEF Max (Pre-Bronch) Actual: 5.38 L/sec
FEF Max (Pre-Bronch) Pred: 7.76 L/sec
FEF25 (% Predicted-Pre): 72 L/sec
FEF25 (%Change-Post): 3 L/sec
FEF25 (%Predicted-Post): 74 L/sec
FEF25 (Post): 4.97 L/sec
FEF25 (Pre-Actual): 4.83 L/sec
FEF75 (% Predicted-Pre): 78 L/sec
FEF75 (%Change-Post): -11 L/sec
FEF75 (%Predicted-Post): 70 L/sec
FEF75 (Post): 0.44 L/sec
FEF75 (Pre-Actual): 0.49 L/sec
FEV1 (Post-Bronch) %Chng: 1 L
FEV1 (Post-Bronch) %Pred: 58 L
FEV1 (Post-Bronch) Post: 1.72 L
FEV1 (Pre-Bronch) %Pred: 57 L
FEV1 (Pre-Bronch) Actual: 1.69 L
FEV1 (Pre-Bronch) Pred: 2.94 L
FEV1/FVC (Post-Bronch) %Chng: 0 %
FEV1/FVC (Post-Bronch) %Pred: 99 %
FEV1/FVC (Post-Bronch) Actual: 77 %
FEV1/FVC (Pre-Bronch) %Pred: 100 %
FEV1/FVC (Pre-Bronch) Actual: 77 %
FEV1/FVC (Pre-Bronch) Pred: 77 %
FEV1FEV6 (% Predicted-Pre): 101 %
FEV1FEV6 (%Change-Post): -3 %
FEV1FEV6 (%Predicted-Post): 98 %
FEV1FEV6 (Post): 77 %
FEV1FEV6 (Pre-Actual): 79 %
FEV1FEV6 (Predicted): 78 %
FEV6 (% Predicted-Pre): 57 L
FEV6 (%Change-Post): 5 L
FEV6 (%Predicted-Post): 60 L
FEV6 (Post): 2.23 L
FEV6 (Pre-Actual): 2.13 L
FEV6 (Predicted): 3.72 L
FEV6FVC (% Predicted-Pre): 103 %
FEV6FVC (%Change-Post): 0 %
FEV6FVC (Post): 100 %
FEV6FVC (Pre-Actual): 100 %
FEV6FVC (Predicted): 97 %
FIF50 (Predicted): 4.48 L/sec
FIFMax (%Change-Post): 0 L/sec
FIFMax (Post): 4.45 L/sec
FIFMax (Pre-Actual): 4.44 L/sec
FIVC (%Change-Post): 3 L
FIVC (Post): 2.37 L
FIVC (Pre-Actual): 2.3 L
FVC (Post-Bronch) %Chng: 2 L
FVC (Post-Bronch) %Pred: 58 L
FVC (Post-Bronch) Actual: 2.23 L
FVC (Pre-Bronch) %Pred: 56 L
FVC (Pre-Bronch) Actual: 2.18 L
FVC (Pre-Bronch) Pred: 3.85 L
Gaw (Pre-Bronch) %Pred: 32 L/s/cmH2O
Gaw (Pre-Bronch) Actual: 0.34 L/s/cmH2O
Gaw (Pre-Bronch) Pred: 1.03 L/s/cmH2O
IC (Pre-Bronch) %Pred: 76 L
IC (Pre-Bronch) Actual: 2.32 L
IC (Pre-Bronch) Pred: 3.05 L
IVC (Pre-Actual): 2.48 L
RV (Pleth) (Pre-Bronch) %Pred: 108 L
RV (Pleth) (Pre-Bronch) Actual: 2.46 L
RV (Pleth) (Pre-Bronch) Pred: 2.27 L
RV/TLC (Pleth) (Pre-Bronch) Actual: 51 %
RV/TLC (Pleth) (Pre-Bronch) Pred: 35 %
Raw (Pre-Bronch) %Pred: 203 cmH2O/L/s
Raw (Pre-Bronch) Actual: 2.95 cmH2O/L/s
Raw (Pre-Bronch) Pred: 1.45 cmH2O/L/s
SVC (Pre-Bronch) %Pred: 57 L
SVC (Pre-Bronch) Actual: 2.39 L
SVC (Pre-Bronch) Pred: 4.15 L
TGV (Pre-Bronch) %Pred: 74 L
TGV (Pre-Bronch) Actual: 2.53 L
TGV (Pre-Bronch) Pred: 3.37 L
TLC (Pleth) (Pre-Bronch) %Pred: 75 L
TLC (Pleth) (Pre-Bronch) Actual: 4.85 L
TLC (Pleth) (Pre-Bronch) Pred: 6.42 L
VA (% Predicted-Pre): 69 L
VA (Pre-Actual): 4.49 L
VA- Pred: 6.42 L
sGaw (Pre-Bronch) %Pred: 57 1/cmH2O*s
sGaw (Pre-Bronch) Actual: 0.11 1/cmH2O*s
sGaw (Pre-Bronch) Pred: 0.2 1/cmH2O*s
sRaw (Pre-Bronch) %Pred: 183 cmH2O*s
sRaw (Pre-Bronch) Actual: 8.72 cmH2O*s
sRaw (Pre-Bronch) Pred: 4.76 cmH2O*s

## 2018-10-27 MED ORDER — ALBUTEROL SULFATE (2.5 MG/3ML) 0.083% IN NEBU
2.50 mg | INHALATION_SOLUTION | Freq: Once | RESPIRATORY_TRACT | Status: AC
Start: 2018-10-27 — End: 2018-10-27
  Administered 2018-10-27: 09:00:00 2.5 mg via RESPIRATORY_TRACT

## 2018-10-27 NOTE — Procedures (Signed)
Date: 10/27/2018  REQUESTING PHYSICIAN: Linden Dolin, PA    HEIGHT:  67.00    WEIGHT:  168.00    TECHNICIAN: SL    RACE: Caucasian    SMOKING HX: Tbco Prod: Cigarette Yrs Smk: 34.0 Pks/Day: 0.1 Yrs  Quit: 20.0 Pack Years: 3.4    DIAGNOSIS: Paroxysmal a-fib    FINDINGS: The patient's spirometry demonstrates a combined  obstructive and restrictive ventilatory defect.    After the application of inhaled bronchodilators there is  improvement only in the FEF 25-75%.    The lung volumes demonstrate a residual volume of 108% of  predicted and the total lung capacity is 75% of predicted.    Diffusion capacity is 76% of predicted.    Flow-volume loop most consistent with a restrictive defect.    Previous pulmonary function studies are not available for  comparison.        [id number]  DD: 10/27/2018 13:22:33  DT: 10/27/2018 14:10:22  JOB: [id number]

## 2019-01-10 ENCOUNTER — Ambulatory Visit
Admission: RE | Admit: 2019-01-10 | Discharge: 2019-01-10 | Disposition: A | Discharge status: Home / Self Care | Payer: No Typology Code available for payment source | Source: Ambulatory Visit | Attending: Physician Assistant | Admitting: Physician Assistant

## 2019-01-10 DIAGNOSIS — I71019 Dissection of thoracic aorta, unspecified: Secondary | ICD-10-CM

## 2019-01-10 DIAGNOSIS — Z1389 Encounter for screening for other disorder: Secondary | ICD-10-CM | POA: Insufficient documentation

## 2019-01-10 DIAGNOSIS — Z01812 Encounter for preprocedural laboratory examination: Secondary | ICD-10-CM | POA: Insufficient documentation

## 2019-01-10 DIAGNOSIS — I7101 Dissection of thoracic aorta: Secondary | ICD-10-CM | POA: Insufficient documentation

## 2019-01-10 LAB — I-STAT CREATININE
Creatinine I-Stat: 1.5 mg/dL — ABNORMAL HIGH (ref 0.80–1.30)
EGFR: 47 mL/min/{1.73_m2} — ABNORMAL LOW (ref 60–150)

## 2019-01-10 MED ORDER — IOHEXOL 350 MG/ML IV SOLN
80.00 mL | Freq: Once | INTRAVENOUS | Status: AC | PRN
Start: 2019-01-10 — End: 2019-01-10
  Administered 2019-01-10: 80 mL via INTRAVENOUS
  Filled 2019-01-10: qty 80

## 2019-01-15 NOTE — Patient Instructions (Signed)
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   The doctor found the aneurysm by looking at a CT scan or another test.    An aneurysm is a bulge on a blood vessel. It happens when an artery (blood vessel) becomes abnormally large and balloons outward like a weak spot on an old tire. Your aneurysm is on your aorta. The aorta is the largest blood vessel in the body. It starts at the heart, goes through the chest and the diaphragm, and then enters the belly. It brings blood to all parts of the body, including the head, organs, arms and legs. Thoracic refers to the chest or the area inside the rib cage. Your aneurysm is called a thoracic aortic aneurysm because it is in your chest.    Most aneurysms occur in people over 60. About five percent of men over the age of 60 develop aneurysms. Men are more likely than women to develop aneurysms.  Aneurysms happen for different reasons, including the following:  Atherosclerosis (hardening of the arteries) causes about 80% percent of aneurysms. It causes weak spots that make blood vessels bulge.   High blood pressure (Hypertension) that is not well-controlled.   Smoking.   Rare genetic diseases (like Marfan's Syndrome or, Ehlers-Danlos Syndrome) that make the blood vessels weak.    Most people with aneurysms don't notice any symptoms. Sometimes, an aneurysm bursts, causing sudden severe pain and internal bleeding. This is a life-threatening condition, often leading to death within minutes.  Symptoms of a thoracic aneurysm include chest pain, upper back pain, and/or shortness of breath. If the aneurysm extends into the belly, you may also have pain in you belly too.    It is extremely important for you to take all medications as prescribed, especially those prescribed for blood pressure control.  If you smoke, STOP IMMEDIATELY.  High blood pressure and smoking are the leading causes of continued growth in your aneurysm.  You will probably need a CT scan  or ultrasound every six months for the first year and then yearly thereafter if there is no significant change in the size or appearance of your aneurysm.    YOU SHOULD SEEK MEDICAL ATTENTION IMMEDIATELY, EITHER HERE OR AT THE NEAREST EMERGENCY DEPARTMENT, IF ANY OF THE FOLLOWING OCCURS:  You develop severe pain in your back, chest or belly.   You have shortness of breath.   You have a fainting spell (you pass out).

## 2019-01-15 NOTE — Progress Notes (Signed)
PROGRESS NOTE    Date Time: 01/16/2019 9:19 AM  [name]  Cardiologist: No att. providers found  Primary Care Physician: [name], MD      History of Presenting Illness:   [name] is a 68 y.o. male who presents to the office with   Chief Complaint   Patient presents with   . Follow-up     cta chest   cc: s/p hybrid afib ablation, type B dissection    PATIENT SEEN JOINTLY WITH [name]    [name] REVIEWED INFORMATION BELOW, DISCUSSED RELEVANT POINTS WITH PATIENTS AND PERSONALLY DEVELOPED PLAN OF CARE.       Mr. [name] presents for routine follow-up after hybrid atrial fibrillation ablation by [name] and Dr. [name] on 11/22/16. 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 CT at the time showed somewhat calcified dissection flap and was thought to not be an acute event. Patient has been followed closely as [name] has advised patient he may recommend TEVAR in the future should dissection dynamics change.      Patient enters today's appointment without complaint of chest pain.  He reports worsening DOE over the last several months.  Reports he is active.    Has echo scheduled for 01/29/2019 to follow valvular heart disease.  Reports he has an upcoming appt with PCP later this week.    EKG 10/26/2018 showed SR.    CTA chest 01/10/2019 shows stable appearing type B dissection involving the descending thoracic aorta to the level of the crus of the diaphragm. Normal-appearing ascending and transverse thoracic aorta with patent innominate, left common carotid carotid as well as left subclavian arteries with no involvement of the transverse thoracic aorta. Maximal diameter of the descending thoracic aorta 4.3 cm with no change from 07/11/2018. Incidental finding perisplenic fluid- needs further follow-up.    CTA chest on 07/11/2018 shows no difference in size of the  descending aortic aneurysm when compared to imaging 3 months ago.      Echocardiogram from 12/08/2017 shows LV normal in size with moderate LVH.  EF 60 to 65%.  Left atrium severely dilated.  Right atrium severely dilated.  Mild mitral regurgitation.  Moderate tricuspid regurgitation.  Moderate aortic stenosis with peak velocity 3.1 m/s, mean pressure gradient 19 mmHg.  Mild to moderate aortic regurgitation.  Mild pulmonic regurgitation.    NYHA II    Past Medical History:     Past Medical History:   Diagnosis Date   . Arrhythmia    . Arthritis    . Atrial fibrillation    . Atrial flutter    . Bipolar affective    . BPH (benign prostatic hyperplasia)    . Complication of anesthesia     resp. asessment   . Diabetes mellitus    . Diabetic neuropathy    . Gout    . Heart murmur    . HOH (hard of hearing)    . Hyperlipidemia    . Hypertension    . Paroxysmal atrial fibrillation    . Pulmonary hypertension    . Renal insufficiency    . Type 2 diabetes mellitus, controlled    . Wears glasses        Past Surgical History:     Past Surgical History:   Procedure Laterality Date   . APPENDECTOMY     . CARDIAC ABLATION     . CARDIAC CATHETERIZATION     .  CARDIOVERSION      x 2   . FLUORO-NO CHARGE (HYBRID ROOM ONLY) N/A 11/22/2016    Procedure: FLUORO-NO CHARGE (HYBRID ROOM ONLY);  Surgeon: [name], MD;  Location: [facility] CATH/EP;  Service: Cardiovascular;  Laterality: N/A;   . HERNIA REPAIR     . PERICARDIOCENTESIS Left 11/25/2016    Procedure: PERICARDIOCENTESIS;  Surgeon: [name], MD;  Location: [facility] CATH/EP;  Service: Cardiovascular;  Laterality: Left;   . TEE     . TONSILLECTOMY     . TONSILLECTOMY, ADENOIDECTOMY         Family History:     Family History   Problem Relation Age of Onset   . Hypertension Mother    . Diabetes Mother    . Heart disease Mother    . Hypertension Sister    . Diabetes Sister    . Hyperlipidemia Sister    . Hypertension Brother    . Heart disease Brother    . Hyperlipidemia Brother    .  Hypertension Brother    . Diabetes Brother    . Hyperlipidemia Brother    . Kidney disease Brother    . Hypertension Daughter        Social History:     Social History     Tobacco Use   Smoking Status Former Smoker   . Packs/day: 0.30   . Years: 40.00   . Pack years: 12.00   . Types: Cigarettes   . Last attempt to quit: 1995   . Years since quitting: 25.4   Smokeless Tobacco Never Used     Social History     Substance and Sexual Activity   Alcohol Use No     Social History     Substance and Sexual Activity   Drug Use No       Allergies:     Allergies   Allergen Reactions   . Codeine Nausea And Vomiting       Medications:     Current Outpatient Medications   Medication Sig Dispense Refill   . acetaminophen (TYLENOL) 500 MG tablet Take 500 mg by mouth every 6 (six) hours as needed for Pain.     Marland Kitchen allopurinol (ZYLOPRIM) 300 MG tablet Take 300 mg by mouth daily.     Marland Kitchen amiodarone (PACERONE) 200 MG tablet Take 1 tablet (200 mg total) by mouth daily 90 tablet 1   . atenolol (TENORMIN) 100 MG tablet Take 100 mg by mouth 2 (two) times daily.     . divalproex EC/DR tablet (DEPAKOTE EC/DR) 250 MG EC tablet Take 250 mg by mouth 2 (two) times daily     . furosemide (LASIX) 80 MG tablet Take 80 mg by mouth daily         . gabapentin (NEURONTIN) 100 MG capsule Take 100 mg by mouth 2 (two) times daily.     Marland Kitchen glimepiride (AMARYL) 1 MG tablet Take 0.5 mg by mouth every morning before breakfast.         . Loperamide HCl (IMODIUM PO) Take by mouth daily as needed.     . metFORMIN (GLUCOPHAGE) 500 MG tablet Take 1,000 mg by mouth 2 (two) times daily with meals        . minoxidil (LONITEN) 10 MG tablet Take 10 mg by mouth daily May take up to 30mg   Qd if BP is elevated       . Multiple Vitamin (MULTIVITAMIN) capsule Take 1 capsule by mouth daily.     Marland Kitchen  olmesartan (BENICAR) 5 MG tablet Take 5 mg by mouth daily     . Phytonadione (K 100 PO) Take by mouth 2 (two) times daily         . pravastatin (PRAVACHOL) 40 MG tablet Take 40 mg by mouth  every evening.     . rivaroxaban (XARELTO) 20 MG Tab Take 20 mg by mouth daily with dinner.     . SENNA CO by Combination route daily as needed.     . tamsulosin (FLOMAX) 0.4 MG Cap Take 0.4 mg by mouth daily.       No current facility-administered medications for this visit.         Review of Systems:   Review of Systems   Constitutional: Negative for fever, chills,  unexpected weight change.   HENT: Negative for trouble swallowing and voice change.    Respiratory: Negative for worsening DOE.   Cardiovascular: Negative for CP   Gastrointestinal: Negative for nausea, vomiting, abdominal pain, diarrhea, constipation, or abdominal distention.   Genitourinary: Negative for dysuria, hematuria.  Skin: Negative for incisional redness or drainage.   Extremities: Positive for LE edema.       Physical Exam:     Vitals:    01/16/19 0835   BP: 130/76   Pulse: 78   SpO2: 97%     Wt Readings from Last 3 Encounters:   01/16/19 79.7 kg (175 lb 9.6 oz)   10/25/18 76.5 kg (168 lb 11.2 oz)   08/30/18 76.2 kg (168 lb)     Body mass index is 27.5 kg/m.     General: no acute distress.  Pleasant affect and actively participates in conversation regarding his care.  Ambulatory.  Neuro: awake, alert, oriented x 3.   HEENT: eomi, sclera anicteric.  Cardiovascular: regular rate and rhythm, 3/6 flow murmur.  Lungs:  Clear to auscultation bilaterally without significant diminishment  Abdomen: non-distended  Extremities: 1-2+ LE edema.   Skin: pink, warm and dry      Assessment/Plan:     Patient Active Problem List    Diagnosis Date Noted   . Follow-up exam 01/26/2017   . 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 Chronic anticoagulation 11/25/2016   . Stroke 11/25/2016   . CKD (chronic kidney disease) 11/25/2016   . Former smoker, stopped smoking many years ago 11/25/2016   . Type 2 diabetes mellitus 11/25/2016   . HTN (hypertension) 11/25/2016   . HLD (hyperlipidemia) 11/25/2016   . Diabetic neuropathy 11/25/2016   . COPD (chronic obstructive  pulmonary disease) 11/25/2016   . Bipolar disorder 11/25/2016   . BPH (benign prostatic hyperplasia) 11/25/2016   . Atrial fibrillation 11/22/2016     Date:11/22/2016    Pre-operative Diagnosis:  1- long-standing persistent atrial fibrillation  2- CKD  3- Reduced LVEF    Post-operative Diagnosis:same    Operation:   1- Hybrid left atrial ablation using bipolar radiofrequency (convergent procedure)  2- Transesophageal echocardiogram    Surgeon: [name], MD     . Chronic atrial fibrillation 10/08/2016     Added automatically from request for surgery [id number]     . Stage 4 chronic kidney disease 10/08/2016     Added automatically from request for surgery [id number]     . Persistent atrial fibrillation 10/07/2016   . Fatty liver 10/07/2016       1. Aftercare following surgery of the circulatory system    2. Atrial fibrillation, s/p hybrid ablation, type B dissection-    Patient  appears to be doing well today in general.  Denies CP.  He has worsening DOE.  Is scheduled for echo.  Recent PFTs show decreased lung function.      Recent CT imaging shows stable type B dissection without changing dynamics.  Vital signs stable.  Heart regular in rate and rhythm.   [name] advised continued monitoring as there has been no interval changes and patient is not having any attributable symptoms.  The potential for future TEVAR remains.  RTC one year with CTA chest.  Patient verbalizes understanding and agreement with plan of care.  Opportunity given for question and answer.      Side issue: incidental finding perisplenic fluid.  Advised f/u with his PCP.    A copy of today's progress note will go to Dr. Myra Rude, Dr. Bronson Curb. Please do not hesitate to contact [name] or me with any questions or concerns. Office number [phone].        The orders placed during this visit, if any:  No orders of the defined types were placed in this encounter.       The patient was instructed as follows:  Patient  Instructions   Thoracic Aortic Aneurysm   You have a Thoracic Aortic Aneurysm, (TAA).   The doctor found the aneurysm by looking at a CT scan or another test.    An aneurysm is a bulge on a blood vessel. It happens when an artery (blood vessel) becomes abnormally large and balloons outward like a weak spot on an old tire. Your aneurysm is on your aorta. The aorta is the largest blood vessel in the body. It starts at the heart, goes through the chest and the diaphragm, and then enters the belly. It brings blood to all parts of the body, including the head, organs, arms and legs. Thoracic refers to the chest or the area inside the rib cage. Your aneurysm is called a thoracic aortic aneurysm because it is in your chest.    Most aneurysms occur in people over 60. About five percent of men over the age of 68 develop aneurysms. Men are more likely than women to develop aneurysms.  Aneurysms happen for different reasons, including the following:  Atherosclerosis (hardening of the arteries) causes about 80% percent of aneurysms. It causes weak spots that make blood vessels bulge.   High blood pressure (Hypertension) that is not well-controlled.   Smoking.   Rare genetic diseases (like Marfan's Syndrome or, Ehlers-Danlos Syndrome) that make the blood vessels weak.    Most people with aneurysms don't notice any symptoms. Sometimes, an aneurysm bursts, causing sudden severe pain and internal bleeding. This is a life-threatening condition, often leading to death within minutes.  Symptoms of a thoracic aneurysm include chest pain, upper back pain, and/or shortness of breath. If the aneurysm extends into the belly, you may also have pain in you belly too.    It is extremely important for you to take all medications as prescribed, especially those prescribed for blood pressure control.  If you smoke, STOP IMMEDIATELY.  High blood pressure and smoking are the leading causes of continued growth in your aneurysm.  You will probably  need a CT scan or ultrasound every six months for the first year and then yearly thereafter if there is no significant change in the size or appearance of your aneurysm.    YOU SHOULD SEEK MEDICAL ATTENTION IMMEDIATELY, EITHER HERE OR AT THE NEAREST EMERGENCY DEPARTMENT, IF ANY OF THE FOLLOWING  OCCURS:  You develop severe pain in your back, chest or belly.   You have shortness of breath.   You have a fainting spell (you pass out).                 No follow-ups on file.       Signed by: Beverly Gust, NP    This documentation was completed utilizing [employer].  Grammatical errors, random word insertions, pronoun errors and incomplete sentences are occasional consequences of this technology due to software limitations.  Not all errors are caught or corrected. Any questions or concerns about the content of this note or information contained within the body of this dictation should be addressed directly with the author for clarification.

## 2019-01-16 ENCOUNTER — Ambulatory Visit (INDEPENDENT_AMBULATORY_CARE_PROVIDER_SITE_OTHER): Payer: No Typology Code available for payment source | Admitting: Specialist

## 2019-01-16 ENCOUNTER — Encounter (INDEPENDENT_AMBULATORY_CARE_PROVIDER_SITE_OTHER): Payer: Self-pay | Admitting: Specialist

## 2019-01-16 VITALS — BP 130/76 | HR 78 | Wt 175.6 lb

## 2019-01-16 DIAGNOSIS — I71019 Dissection of thoracic aorta, unspecified: Secondary | ICD-10-CM

## 2019-01-16 DIAGNOSIS — I7101 Dissection of thoracic aorta: Secondary | ICD-10-CM

## 2019-01-16 DIAGNOSIS — I4891 Unspecified atrial fibrillation: Secondary | ICD-10-CM

## 2019-01-16 DIAGNOSIS — Z09 Encounter for follow-up examination after completed treatment for conditions other than malignant neoplasm: Secondary | ICD-10-CM

## 2019-01-29 ENCOUNTER — Ambulatory Visit: Payer: No Typology Code available for payment source

## 2019-01-29 DIAGNOSIS — I480 Paroxysmal atrial fibrillation: Secondary | ICD-10-CM

## 2019-01-29 DIAGNOSIS — I350 Nonrheumatic aortic (valve) stenosis: Secondary | ICD-10-CM

## 2019-02-20 ENCOUNTER — Telehealth: Payer: Self-pay

## 2019-02-20 NOTE — Telephone Encounter (Signed)
Most recent office note, labs, etc.  Requested from [employer] office and updates were requested in care everywhere.

## 2019-02-21 NOTE — Telephone Encounter (Signed)
Notes and labs received

## 2019-02-26 ENCOUNTER — Encounter: Payer: Self-pay | Admitting: Medical

## 2019-02-26 ENCOUNTER — Telehealth: Payer: No Typology Code available for payment source | Admitting: Medical

## 2019-02-26 VITALS — BP 120/70 | HR 68 | Wt 175.0 lb

## 2019-02-26 DIAGNOSIS — I71019 Dissection of thoracic aorta, unspecified: Secondary | ICD-10-CM

## 2019-02-26 DIAGNOSIS — I10 Essential (primary) hypertension: Secondary | ICD-10-CM

## 2019-02-26 DIAGNOSIS — R942 Abnormal results of pulmonary function studies: Secondary | ICD-10-CM

## 2019-02-26 DIAGNOSIS — I350 Nonrheumatic aortic (valve) stenosis: Secondary | ICD-10-CM

## 2019-02-26 DIAGNOSIS — I2721 Secondary pulmonary arterial hypertension: Secondary | ICD-10-CM

## 2019-02-26 DIAGNOSIS — I517 Cardiomegaly: Secondary | ICD-10-CM

## 2019-02-26 DIAGNOSIS — Z79899 Other long term (current) drug therapy: Secondary | ICD-10-CM

## 2019-02-26 DIAGNOSIS — I480 Paroxysmal atrial fibrillation: Secondary | ICD-10-CM

## 2019-02-26 NOTE — Progress Notes (Signed)
Cardiology Visit  via Telehealth      [name]   Date of Birth: [birth year]    Provider: Linden Dolin, PA     Patient Care Team:  [name], MD as PCP - General (Family Medicine)  [name], DO as Consulting Physician (Cardiology)  [name], MD as Consulting Physician (Thoracic and Cardiac Surgery)    Chief Complaint: Atrial Fibrillation      This is a telehealth visit which was conducted with the use of interactive HIPPA compliant Telehealth visit - [employer] telecommunication that permitted real time communication between Mr. [name] and myself.     He consented to participation and received services at home, while I was located at [facility].     This visit was changed from an in-person visit to a telehealth visit to lower the risk of exposure and / or spread of the current pandemic with the SARS CoV-2 virus. This is based on the guidelines from the [facility] and other health agencies.      History of Present Illness   Mr. [name] is a 68 y.o. male being seen today for 4 month f/u.     She has a history of atrial fibrillation.He is s/pconvergentAF ablation.  He was on amiodarone but this was inadvertently stopped because his prescription ran out.  At his f/u appointment in January, he was found to be back in atrial fib. He was restarted on amiodarone and underwent repeat CV 08/30/18. This was successful and he has been maintaining SR. He feels much better.     He remains on amiodarone. He had PFTs 10/27/18 which showed Diffusion capacity is 76% of predicted with a component of restrictive lung diease. Labs from 07/2018 show normal LFTs and renal function. TSH was not completed.      He saw [name] for type B aortic dissection (not felt to be an acute event from his procedure), which he reports is stable.  Repeat Chest CT June 2020 showed no significant change.     He has not noticed any recurrent AFib.       He was having trouble with increased edema and SOB back in June, but over the last week this has improved. He bumps his Lasix PRN. This was about the time of his echo. He feesl better now, weight baseline. No further swelling.      BPs 120-140/ 60-70's.     No CP, palpitations, syncope.     Past Medical History     Past Medical History 02/26/2019 LLB-     1. Atrial fibrillation  A. Cardioversion 10/22/11.   B. Echo 10/22/11- moderate-severe concentric LVH. Biatrial enlargement. Mild AS, dilated inferior vena cava, small pericardial effusion. Peak velocity across the AV at 2.2 meters/s.  C. Cardiac abltation 01/13/15.   D. MRI Cardiac 03/16/16- LVEF 68.4%. Moderate septal wall hypertrophy (1.6cm) and mild posterior wall hypertrophy (1.3cm). Focal area of mid-myocardial wall delayed enhancement in basal anteroseptal wall and RV insertion point consistent with hypertensive heart disease. RVEF 41.7%. Severe LA enlargement and moderate RA enlargement. 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 Mild AS with peak velocity 2.72m/s. Mild central AR. Mild MR and TR. Small to moderate pericardial effusion. Mildly dilated ascending aorta. Aortic arch and descending thoracic aorta are ectatic. Trivial right pleural effusion. Dilated central pulm artery at 4.3cm.   E. s/p TEE and [place] cardioversion  06/04/16  F. Convergent AF Ablation 11/22/16 - Procedure was complicated with pericardial effusion/ tamponade requiring pericardiocentesis. He developed left pleural effusion with thoracentesis.   G. Echo 12/03/16- No pericardial effusion, aortic valve is severely calcified with severely restricted systolic opening, mild regurgitation, LV is normal in size with moderate to severe concentric hypertrophy, systolic function is preserved with an EF 35-32%, diastolic dysfunction that couldn't be graded.   H. Echo 12/08/17- EF 60-65%. Moderate concentric LV hypertrophy. LA and RA severely dilated.  Mild MR and moderate TR. Moderate valvular aortic stenosis. Mild to moderate AR and pulmonic valvular regurgitation. Mildly dilated ascending aorta 3.8-4.2 cm.   I. Echo 01/29/19- Moderate to severe concentric LVH, EF 99-24%, grade 2 diastolic dysfunction of the LV (with evidence of increased left atrial pressure), RV size is moderately increased, RV hypertrophy, RV systolic function is mild to moderately decreased, proximal ascending aorta is mildly dilated at 4.2cm. moderate aortic stenosis, mild to moderate insufficiency, mitral valve regurgitation is mild, tricuspid regurgitation is moderate, estimated pulm arterial systolic pressure is severely elevated, 47mmHg, pulm valve regurgitation is mild to moderate.     2. Aorta status  A. 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 (Again there is small area of persistent perfusion of the aneurysm sac which appears to have diminished since prior exam and may be related to partial thrombosis versus technique). At subsequent follow-up, would encourage delayed evaluation in addition to arterial phase evaluation. Maximal aneurysmal dilatation involving the descending segment measures 45 x 39 mm.     3. Hypertension.  4. Hyperlipidemia.   5. Type I Diabetes Mellitus.  6. Bipolar disorder.  7. Fatty liver.  8. Renal insufficiency.  A. Normal renal ultrasound October 23, 2011.  9. Basilar Airway disease    Family History     Family History   Problem Relation Age of Onset   . Hypertension Mother    . Diabetes Mother    . Heart disease Mother    . Hypertension Sister    . Diabetes Sister    . Hyperlipidemia Sister    . Hypertension Brother    . Heart disease Brother    . Hyperlipidemia Brother    . Hypertension Brother    . Diabetes Brother    . Hyperlipidemia Brother    . Kidney disease Brother    . Hypertension Daughter        Social History     Social History     Tobacco  Use   . Smoking status: Former Smoker     Packs/day: 0.30     Years: 40.00     Pack years: 12.00     Types: Cigarettes     Last attempt to quit: 1995     Years since quitting: 25.5   . Smokeless tobacco: Never Used   Substance Use Topics   . Alcohol use: No   . Drug use: No     Medications     Current Outpatient Medications:   .  acetaminophen (TYLENOL) 500 MG tablet, Take 500 mg by mouth every 6 (six) hours as needed for Pain., Disp: , Rfl:   .  allopurinol (ZYLOPRIM) 300 MG tablet, Take 300 mg by mouth daily., Disp: , Rfl:   .  amiodarone (PACERONE) 200 MG tablet, Take 1 tablet (200 mg total) by mouth daily, Disp: 90 tablet, Rfl: 1  .  atenolol (TENORMIN) 100 MG tablet,  Take 100 mg by mouth 2 (two) times daily., Disp: , Rfl:   .  divalproex EC/DR tablet (DEPAKOTE EC/DR) 250 MG EC tablet, Take 250 mg by mouth 2 (two) times daily, Disp: , Rfl:   .  furosemide (LASIX) 80 MG tablet, Take 80 mg by mouth daily   , Disp: , Rfl:   .  gabapentin (NEURONTIN) 100 MG capsule, Take 100 mg by mouth 2 (two) times daily., Disp: , Rfl:   .  glimepiride (AMARYL) 1 MG tablet, Take 0.5 mg by mouth every morning before breakfast.  , Disp: , Rfl:   .  Loperamide HCl (IMODIUM PO), Take by mouth daily as needed., Disp: , Rfl:   .  metFORMIN (GLUCOPHAGE) 500 MG tablet, Take 1,000 mg by mouth 2 (two) times daily with meals  , Disp: , Rfl:   .  minoxidil (LONITEN) 10 MG tablet, Take 10 mg by mouth daily May take up to 30mg   Qd if BP is elevated , Disp: , Rfl:   .  Multiple Vitamin (MULTIVITAMIN) capsule, Take 1 capsule by mouth daily., Disp: , Rfl:   .  olmesartan (BENICAR) 5 MG tablet, Take 5 mg by mouth daily, Disp: , Rfl:   .  Phytonadione (K 100 PO), Take by mouth 2 (two) times daily   , Disp: , Rfl:   .  pravastatin (PRAVACHOL) 40 MG tablet, Take 40 mg by mouth every evening., Disp: , Rfl:   .  rivaroxaban (XARELTO) 20 MG Tab, Take 20 mg by mouth daily with dinner., Disp: , Rfl:   .  SENNA CO, by Combination route daily as needed.,  Disp: , Rfl:   .  tamsulosin (FLOMAX) 0.4 MG Cap, Take 0.4 mg by mouth daily., Disp: , Rfl:   Physical Exam      Vitals:    02/26/19 0832   BP: 120/70   Pulse: 68   Weight: 79.4 kg (175 lb)     Wt Readings from Last 3 Encounters:   02/26/19 79.4 kg (175 lb)   01/16/19 79.7 kg (175 lb 9.6 oz)   10/25/18 76.5 kg (168 lb 11.2 oz)        General appearance - alert, well appearing, and in no distress  Head: normocephalic, atraumatic  Mental status -  Alert and oriented,   Eyes - extraocular eye movements intact  Neck -  Supple, no thyromegaly      Cardiovascular system -   No jugular venous distension    Neurological - alert, oriented x3, no obvious gross neurological deficits  Extremities -  no clubbing or cyanosis.  No edema bilateral  Psych- appropriate affect      Labs     Lab Results   Component Value Date/Time    WBC 13.3 (H) 12/09/2016 07:10 AM    RBC 3.14 (L) 12/09/2016 07:10 AM    HGB 8.9 (L) 12/09/2016 07:10 AM    HCT 29.2 (L) 12/09/2016 07:10 AM    PLT 621 (H) 12/09/2016 07:10 AM    TSH 1.81 11/26/2016 03:02 AM     Lab Results   Component Value Date/Time    NA 135 (L) 12/09/2016 07:10 AM    NA 135 (L) 12/09/2016 07:10 AM    K 3.7 08/30/2018 08:10 AM    CL 96 (L) 12/09/2016 07:10 AM    CL 96 (L) 12/09/2016 07:10 AM    CO2 30.9 (H) 12/09/2016 07:10 AM    CO2 30.9 (H) 12/09/2016 07:10 AM  GLU 115 (H) 12/09/2016 07:10 AM    GLU 115 (H) 12/09/2016 07:10 AM    BUN 16 12/09/2016 07:10 AM    BUN 16 12/09/2016 07:10 AM    CREAT 1.50 (H) 01/10/2019 10:15 AM    CREAT 1.13 12/09/2016 07:10 AM    CREAT 1.13 12/09/2016 07:10 AM    PROT 6.5 12/09/2016 07:10 AM    ALKPHOS 117 12/09/2016 07:10 AM    AST 27 12/09/2016 07:10 AM    ALT 44 12/09/2016 07:10 AM     Lab Results   Component Value Date/Time    CHOL 70 (L) 11/26/2016 03:02 AM    TRIG 78 11/26/2016 03:02 AM    HDL 21 (L) 11/26/2016 03:02 AM    LDL 33 11/26/2016 03:02 AM     Lab Results   Component Value Date/Time    HGBA1CPERCNT 6.5 11/26/2016 03:02 AM     Lab Results    Component Value Date/Time    BNP 1,041.5 (H) 11/15/2016 11:10 AM       Impression and Recommendations:     1. PAF (paroxysmal atrial fibrillation)  s/p convergent AF ablation with recurrent AF and CV 1/20. He is maintaining SR. Continue amiodarone and xarelto. Last creatinine was up when checked for CT - this is not his baseline, perhaps due to increased diuretics. Will recheck, if remains elevated will need to reduce xarelto to 15 mg daily.     2. On amiodarone therapy  Will arrange for TSH and CMP. PFTs done 10/2018. Trying to find his baseline for comparison.     3. Aortic dissection, thoracic  Keep f/u with Dr Kathrin Ruddy, stable on last CT    4. Aortic valve stenosis, etiology of cardiac valve disease unspecified  Moderate    5. Pulmonary HTN  RH enlargement with severe pulm HTN. I discussed with Dr [name]. Given concern for restrictive lung disease on PFTs and chronic amiodarone, we will obtain HRCT of chest. Will consider RHC pending these results.   .  Return in about 6 months (around 08/29/2019).    Electronically signed by:   Linden Dolin, PA  02/26/2019

## 2019-03-13 ENCOUNTER — Ambulatory Visit
Admission: RE | Admit: 2019-03-13 | Discharge: 2019-03-13 | Disposition: A | Discharge status: Home / Self Care | Payer: No Typology Code available for payment source | Source: Ambulatory Visit | Attending: Medical | Admitting: Medical

## 2019-03-13 DIAGNOSIS — J984 Other disorders of lung: Secondary | ICD-10-CM | POA: Insufficient documentation

## 2019-03-13 DIAGNOSIS — I2721 Secondary pulmonary arterial hypertension: Secondary | ICD-10-CM

## 2019-03-13 DIAGNOSIS — I517 Cardiomegaly: Secondary | ICD-10-CM

## 2019-03-13 DIAGNOSIS — R942 Abnormal results of pulmonary function studies: Secondary | ICD-10-CM

## 2019-03-13 DIAGNOSIS — Z79899 Other long term (current) drug therapy: Secondary | ICD-10-CM

## 2019-03-27 ENCOUNTER — Telehealth: Payer: Self-pay

## 2019-03-27 NOTE — Telephone Encounter (Addendum)
Left VM.     -CStewart, CNA   ----- Message from Linden Dolin, [place] sent at 03/26/2019  5:35 PM EDT -----  No evidence that amiodarone is affecting his lungs. Good news!

## 2019-06-14 ENCOUNTER — Ambulatory Visit
Admission: RE | Admit: 2019-06-14 | Discharge: 2019-06-14 | Disposition: A | Discharge status: Home / Self Care | Payer: No Typology Code available for payment source | Source: Ambulatory Visit | Attending: Medical | Admitting: Medical

## 2019-06-14 ENCOUNTER — Telehealth: Payer: Self-pay

## 2019-06-14 ENCOUNTER — Inpatient Hospital Stay: Admission: RE | Admit: 2019-06-14 | Payer: Self-pay | Source: Ambulatory Visit

## 2019-06-14 DIAGNOSIS — I10 Essential (primary) hypertension: Secondary | ICD-10-CM | POA: Insufficient documentation

## 2019-06-14 DIAGNOSIS — I7101 Dissection of thoracic aorta: Secondary | ICD-10-CM | POA: Insufficient documentation

## 2019-06-14 DIAGNOSIS — I350 Nonrheumatic aortic (valve) stenosis: Secondary | ICD-10-CM | POA: Insufficient documentation

## 2019-06-14 DIAGNOSIS — I480 Paroxysmal atrial fibrillation: Secondary | ICD-10-CM | POA: Insufficient documentation

## 2019-06-14 DIAGNOSIS — Z79899 Other long term (current) drug therapy: Secondary | ICD-10-CM | POA: Insufficient documentation

## 2019-06-14 DIAGNOSIS — I71019 Dissection of thoracic aorta, unspecified: Secondary | ICD-10-CM

## 2019-06-14 LAB — COMPREHENSIVE METABOLIC PANEL
ALT: 11 U/L (ref 0–55)
AST (SGOT): 19 U/L (ref 10–42)
Albumin/Globulin Ratio: 1.32 Ratio (ref 0.80–2.00)
Albumin: 3.7 gm/dL (ref 3.5–5.0)
Alkaline Phosphatase: 126 U/L (ref 40–145)
Anion Gap: 13.1 mMol/L (ref 7.0–18.0)
BUN / Creatinine Ratio: 16.2 Ratio (ref 10.0–30.0)
BUN: 37 mg/dL — ABNORMAL HIGH (ref 7–22)
Bilirubin, Total: 0.8 mg/dL (ref 0.1–1.2)
CO2: 27 mMol/L (ref 20–30)
Calcium: 8.8 mg/dL (ref 8.5–10.5)
Chloride: 107 mMol/L (ref 98–110)
Creatinine: 2.29 mg/dL — ABNORMAL HIGH (ref 0.80–1.30)
EGFR: 28 mL/min/{1.73_m2} — ABNORMAL LOW (ref 60–150)
Globulin: 2.8 gm/dL (ref 2.0–4.0)
Glucose: 89 mg/dL (ref 71–99)
Osmolality Calculated: 293 mOsm/kg (ref 275–300)
Potassium: 4.1 mMol/L (ref 3.5–5.3)
Protein, Total: 6.5 gm/dL (ref 6.0–8.3)
Sodium: 143 mMol/L (ref 136–147)

## 2019-06-14 LAB — TSH: TSH: 4.57 u[IU]/mL — ABNORMAL HIGH (ref 0.40–4.20)

## 2019-06-14 NOTE — Telephone Encounter (Signed)
C/O BILAT LEE X 4 MTHS, WORSE X 1 MONTH WITH DOE AND WEIGHT GAIN    Ptt called in, has had swelling x 4 months, 10lb weight gain and DOE. He confirms that he is on lasix 80mg  QD. BP 126/68- 158/94 HR 65-72bpm. Pt has not completed lab work in chart but will do so today, does he need anything else drawn? TSH and CMP are whats ordered. Made appt for soonest 06/25/19 with June Leap NP while Dr [name] in office    [place] 02/26/19 with Tilford Pillar PN

## 2019-06-14 NOTE — Telephone Encounter (Signed)
Take extra 40 mg lasix x 3 days   Check back in with [place] on monday

## 2019-06-14 NOTE — Telephone Encounter (Signed)
Called and spoke with patient. Relayed Lisas message and no further questions at this time.     Thank you,   Lenard Simmer. CNA

## 2019-06-18 NOTE — Telephone Encounter (Signed)
Message relayed, appt moved to 11/11 with Jimmey Ralph NP while Lattie Haw is here    Ahill PN

## 2019-06-18 NOTE — Telephone Encounter (Signed)
Called and left voicemail with patient. Please relay Lisas message when he calls back.     Thank you,   Lenard Simmer. CNA

## 2019-06-18 NOTE — Telephone Encounter (Signed)
Is SOB and weight gain improved?  If not, move up appt. Ok to see [place] of needed  Reduce xarelto 15 mg daily  Hold benicar  Reduce lasix to 80 mg daily

## 2019-06-18 NOTE — Telephone Encounter (Signed)
Pt called back in, he has had no change in swelling since the increase. He did complete labs, creat and Bun elevated. Results are in chart, please review    Ahill PN

## 2019-06-19 ENCOUNTER — Telehealth: Payer: Self-pay

## 2019-06-19 NOTE — Telephone Encounter (Signed)
Contacted PCP for most recent office note, labs, and EKG. Updates requested in Care Everywhere.

## 2019-06-20 ENCOUNTER — Encounter: Payer: Self-pay | Admitting: Family

## 2019-06-20 ENCOUNTER — Ambulatory Visit: Payer: No Typology Code available for payment source | Admitting: Family

## 2019-06-20 ENCOUNTER — Inpatient Hospital Stay: Payer: No Typology Code available for payment source

## 2019-06-20 ENCOUNTER — Inpatient Hospital Stay
Admission: AD | Admit: 2019-06-20 | Discharge: 2019-06-25 | DRG: 291 | Disposition: A | Discharge status: Home / Self Care | Payer: No Typology Code available for payment source | Source: Ambulatory Visit | Attending: Internal Medicine | Admitting: Internal Medicine

## 2019-06-20 VITALS — BP 146/80 | HR 56 | Ht 67.0 in | Wt 177.0 lb

## 2019-06-20 DIAGNOSIS — I7100 Dissection of unspecified site of aorta: Secondary | ICD-10-CM | POA: Diagnosis present

## 2019-06-20 DIAGNOSIS — I5082 Biventricular heart failure: Secondary | ICD-10-CM | POA: Diagnosis present

## 2019-06-20 DIAGNOSIS — I5033 Acute on chronic diastolic (congestive) heart failure: Secondary | ICD-10-CM | POA: Diagnosis present

## 2019-06-20 DIAGNOSIS — E1122 Type 2 diabetes mellitus with diabetic chronic kidney disease: Secondary | ICD-10-CM | POA: Diagnosis present

## 2019-06-20 DIAGNOSIS — E1140 Type 2 diabetes mellitus with diabetic neuropathy, unspecified: Secondary | ICD-10-CM | POA: Diagnosis present

## 2019-06-20 DIAGNOSIS — M1990 Unspecified osteoarthritis, unspecified site: Secondary | ICD-10-CM | POA: Diagnosis present

## 2019-06-20 DIAGNOSIS — N400 Enlarged prostate without lower urinary tract symptoms: Secondary | ICD-10-CM | POA: Diagnosis present

## 2019-06-20 DIAGNOSIS — E785 Hyperlipidemia, unspecified: Secondary | ICD-10-CM | POA: Diagnosis present

## 2019-06-20 DIAGNOSIS — W101XXA Fall (on)(from) sidewalk curb, initial encounter: Secondary | ICD-10-CM | POA: Diagnosis present

## 2019-06-20 DIAGNOSIS — N1831 Chronic kidney disease, stage 3a: Secondary | ICD-10-CM | POA: Diagnosis present

## 2019-06-20 DIAGNOSIS — Z8249 Family history of ischemic heart disease and other diseases of the circulatory system: Secondary | ICD-10-CM

## 2019-06-20 DIAGNOSIS — Z885 Allergy status to narcotic agent status: Secondary | ICD-10-CM

## 2019-06-20 DIAGNOSIS — I5031 Acute diastolic (congestive) heart failure: Secondary | ICD-10-CM | POA: Diagnosis present

## 2019-06-20 DIAGNOSIS — I50813 Acute on chronic right heart failure: Secondary | ICD-10-CM

## 2019-06-20 DIAGNOSIS — I272 Pulmonary hypertension, unspecified: Secondary | ICD-10-CM | POA: Diagnosis present

## 2019-06-20 DIAGNOSIS — Z9049 Acquired absence of other specified parts of digestive tract: Secondary | ICD-10-CM

## 2019-06-20 DIAGNOSIS — Z841 Family history of disorders of kidney and ureter: Secondary | ICD-10-CM

## 2019-06-20 DIAGNOSIS — S60221A Contusion of right hand, initial encounter: Secondary | ICD-10-CM | POA: Diagnosis present

## 2019-06-20 DIAGNOSIS — I083 Combined rheumatic disorders of mitral, aortic and tricuspid valves: Secondary | ICD-10-CM | POA: Diagnosis present

## 2019-06-20 DIAGNOSIS — I4819 Other persistent atrial fibrillation: Secondary | ICD-10-CM | POA: Diagnosis present

## 2019-06-20 DIAGNOSIS — Z7984 Long term (current) use of oral hypoglycemic drugs: Secondary | ICD-10-CM

## 2019-06-20 DIAGNOSIS — Z8349 Family history of other endocrine, nutritional and metabolic diseases: Secondary | ICD-10-CM

## 2019-06-20 DIAGNOSIS — Z87891 Personal history of nicotine dependence: Secondary | ICD-10-CM

## 2019-06-20 DIAGNOSIS — R188 Other ascites: Secondary | ICD-10-CM | POA: Diagnosis present

## 2019-06-20 DIAGNOSIS — Z833 Family history of diabetes mellitus: Secondary | ICD-10-CM

## 2019-06-20 DIAGNOSIS — Z7901 Long term (current) use of anticoagulants: Secondary | ICD-10-CM

## 2019-06-20 DIAGNOSIS — I130 Hypertensive heart and chronic kidney disease with heart failure and stage 1 through stage 4 chronic kidney disease, or unspecified chronic kidney disease: Principal | ICD-10-CM | POA: Diagnosis present

## 2019-06-20 DIAGNOSIS — F319 Bipolar disorder, unspecified: Secondary | ICD-10-CM | POA: Diagnosis present

## 2019-06-20 DIAGNOSIS — M109 Gout, unspecified: Secondary | ICD-10-CM | POA: Diagnosis present

## 2019-06-20 DIAGNOSIS — R001 Bradycardia, unspecified: Secondary | ICD-10-CM | POA: Diagnosis present

## 2019-06-20 DIAGNOSIS — I509 Heart failure, unspecified: Secondary | ICD-10-CM

## 2019-06-20 DIAGNOSIS — Z79899 Other long term (current) drug therapy: Secondary | ICD-10-CM

## 2019-06-20 DIAGNOSIS — H9190 Unspecified hearing loss, unspecified ear: Secondary | ICD-10-CM | POA: Diagnosis present

## 2019-06-20 LAB — COMPREHENSIVE METABOLIC PANEL
ALT: 11 U/L (ref 0–55)
AST (SGOT): 18 U/L (ref 10–42)
Albumin/Globulin Ratio: 1.12 Ratio (ref 0.80–2.00)
Albumin: 3.8 gm/dL (ref 3.5–5.0)
Alkaline Phosphatase: 140 U/L (ref 40–145)
Anion Gap: 12.8 mMol/L (ref 7.0–18.0)
BUN / Creatinine Ratio: 19.9 Ratio (ref 10.0–30.0)
BUN: 37 mg/dL — ABNORMAL HIGH (ref 7–22)
Bilirubin, Total: 0.7 mg/dL (ref 0.1–1.2)
CO2: 27 mMol/L (ref 20–30)
Calcium: 9.2 mg/dL (ref 8.5–10.5)
Chloride: 108 mMol/L (ref 98–110)
Creatinine: 1.86 mg/dL — ABNORMAL HIGH (ref 0.80–1.30)
EGFR: 36 mL/min/{1.73_m2} — ABNORMAL LOW (ref 60–150)
Globulin: 3.4 gm/dL (ref 2.0–4.0)
Glucose: 85 mg/dL (ref 71–99)
Osmolality Calculated: 295 mOsm/kg (ref 275–300)
Potassium: 3.8 mMol/L (ref 3.5–5.3)
Protein, Total: 7.2 gm/dL (ref 6.0–8.3)
Sodium: 144 mMol/L (ref 136–147)

## 2019-06-20 MED ORDER — AMIODARONE HCL 200 MG PO TABS
200.0000 mg | ORAL_TABLET | Freq: Every day | ORAL | Status: DC
Start: 2019-06-20 — End: 2019-06-25
  Administered 2019-06-20 – 2019-06-25 (×6): 200 mg via ORAL
  Filled 2019-06-20 (×6): qty 1

## 2019-06-20 MED ORDER — DIVALPROEX SODIUM 250 MG PO TBEC
250.00 mg | DELAYED_RELEASE_TABLET | Freq: Two times a day (BID) | ORAL | Status: DC
Start: 2019-06-20 — End: 2019-06-25
  Administered 2019-06-20 – 2019-06-25 (×10): 250 mg via ORAL
  Filled 2019-06-20 (×11): qty 1

## 2019-06-20 MED ORDER — ACETAMINOPHEN 650 MG RE SUPP
650.00 mg | RECTAL | Status: DC | PRN
Start: 2019-06-20 — End: 2019-06-25

## 2019-06-20 MED ORDER — ACETAMINOPHEN 325 MG PO TABS
650.0000 mg | ORAL_TABLET | ORAL | Status: DC | PRN
Start: 2019-06-20 — End: 2019-06-25
  Administered 2019-06-21 – 2019-06-25 (×8): 650 mg via ORAL
  Filled 2019-06-20 (×8): qty 2

## 2019-06-20 MED ORDER — ALLOPURINOL 300 MG PO TABS
300.0000 mg | ORAL_TABLET | Freq: Every day | ORAL | Status: DC
Start: 2019-06-20 — End: 2019-06-25
  Administered 2019-06-20 – 2019-06-25 (×6): 300 mg via ORAL
  Filled 2019-06-20 (×6): qty 1

## 2019-06-20 MED ORDER — TAMSULOSIN HCL 0.4 MG PO CAPS
0.40 mg | ORAL_CAPSULE | Freq: Every day | ORAL | Status: DC
Start: 2019-06-20 — End: 2019-06-25
  Administered 2019-06-20 – 2019-06-25 (×6): 0.4 mg via ORAL
  Filled 2019-06-20 (×6): qty 1

## 2019-06-20 MED ORDER — FUROSEMIDE 10 MG/ML IJ SOLN
10.00 mg/h | INTRAMUSCULAR | Status: DC
Start: 2019-06-20 — End: 2019-06-20
  Filled 2019-06-20: qty 50

## 2019-06-20 MED ORDER — PCA SYRINGE/STERILE/EMPTY MISC
10.00 mg/h | Status: DC
Start: 2019-06-20 — End: 2019-06-24
  Administered 2019-06-20 – 2019-06-23 (×3): 10 mg/h via INTRAVENOUS
  Filled 2019-06-20 (×5): qty 50

## 2019-06-20 MED ORDER — GLIMEPIRIDE 2 MG PO TABS
0.5000 mg | ORAL_TABLET | Freq: Every morning | ORAL | Status: DC
Start: 2019-06-21 — End: 2019-06-25
  Administered 2019-06-21 – 2019-06-25 (×5): 0.5 mg via ORAL
  Filled 2019-06-20 (×5): qty 1

## 2019-06-20 MED ORDER — ACETAMINOPHEN 500 MG PO TABS
500.0000 mg | ORAL_TABLET | Freq: Four times a day (QID) | ORAL | Status: DC | PRN
Start: 2019-06-20 — End: 2019-06-20

## 2019-06-20 MED ORDER — PRAVASTATIN SODIUM 40 MG PO TABS
40.0000 mg | ORAL_TABLET | Freq: Every evening | ORAL | Status: DC
Start: 2019-06-20 — End: 2019-06-25
  Administered 2019-06-20 – 2019-06-24 (×5): 40 mg via ORAL
  Filled 2019-06-20 (×6): qty 1

## 2019-06-20 MED ORDER — RIVAROXABAN 15 MG PO TABS
15.0000 mg | ORAL_TABLET | Freq: Every day | ORAL | Status: DC
Start: 2019-06-20 — End: 2019-06-25
  Administered 2019-06-20 – 2019-06-24 (×5): 15 mg via ORAL
  Filled 2019-06-20 (×6): qty 1

## 2019-06-20 MED ORDER — ONDANSETRON 4 MG PO TBDP
4.00 mg | ORAL_TABLET | Freq: Three times a day (TID) | ORAL | Status: DC | PRN
Start: 2019-06-20 — End: 2019-06-25

## 2019-06-20 MED ORDER — ACETAMINOPHEN 160 MG/5ML PO SOLN
650.00 mg | ORAL | Status: DC | PRN
Start: 2019-06-20 — End: 2019-06-25

## 2019-06-20 MED ORDER — GABAPENTIN 100 MG PO CAPS
100.00 mg | ORAL_CAPSULE | Freq: Two times a day (BID) | ORAL | Status: DC
Start: 2019-06-20 — End: 2019-06-25
  Administered 2019-06-20 – 2019-06-25 (×10): 100 mg via ORAL
  Filled 2019-06-20 (×10): qty 1

## 2019-06-20 MED ORDER — SODIUM CHLORIDE (PF) 0.9 % IJ SOLN
3.00 mL | Freq: Three times a day (TID) | INTRAMUSCULAR | Status: DC
Start: 2019-06-20 — End: 2019-06-25
  Administered 2019-06-20 – 2019-06-24 (×7): 3 mL via INTRAVENOUS

## 2019-06-20 MED ORDER — ONDANSETRON HCL 4 MG/2ML IJ SOLN
4.00 mg | Freq: Three times a day (TID) | INTRAMUSCULAR | Status: DC | PRN
Start: 2019-06-20 — End: 2019-06-25

## 2019-06-20 MED ORDER — SODIUM CHLORIDE (PF) 0.9 % IJ SOLN
0.40 mg | INTRAMUSCULAR | Status: DC | PRN
Start: 2019-06-20 — End: 2019-06-25

## 2019-06-20 MED ORDER — ATENOLOL 50 MG PO TABS
100.0000 mg | ORAL_TABLET | Freq: Two times a day (BID) | ORAL | Status: DC
Start: 2019-06-20 — End: 2019-06-25
  Administered 2019-06-20 – 2019-06-23 (×7): 100 mg via ORAL
  Filled 2019-06-20 (×11): qty 2

## 2019-06-20 NOTE — Telephone Encounter (Signed)
NOTE AND LABS RECEIVED ALREADY IN 07.15.20 SCAN ONLY

## 2019-06-20 NOTE — H&P (Signed)
Cardiology Admission History and Physical       Date Time: 06/20/19 12:19 PM  [name], [name]  MRN#: [id number]  DOB: November 24, 1950  PCP: [name], MD    Primary Cardiologist: Dr. Bronson Curb     Reason for Admission:   CHF exacerbation    Assessment / Plan   68 y.o male with PMH of atrial fibrillation s/p convergent ablation, type B aortic dissection, HTN, HLD, DM1, bipolar disorder, renal insufficiency, basilar airway disease, pulm HTN, and moderate AS is direct admitted from the clinic for CHF exacerbation. Previously attempted to adjust his diuretics with no improvement in lower extremity edema or shortness of breath.  BUN/creatinine increased to 37/2.29. Will admit under Dr. [name] for IV diuresis and close monitoring of renal function.     Plan  1. Start IV diuresis, goal Net negative 2L  2. STAT BMP  3. Daily BMP  4. Daily weights  5. May repeat TTE   6. X-ray right hand to evaluate for fracture          History of Present Illness:   [name] is a 68 y.o. male with PMH of atrial fibrillation s/p convergent ablation, type B aortic dissection, HTN, HLD, DM1, bipolar disorder, renal insufficiency, basilar airway disease, pulm HTN, and moderate AS presented to the [facility] clinic for a follow up appointment. In the past month, he has gained 10 lbs, decreased appetite, and worsening shortness of breath. Sharol Roussel PA adjusted his lasix dose at the beginning of November and BMP showed an increase in his renal function- 2.29/37. At this time, his lasix dose was reduced to 80 mg daily, Xarelto reduced to 15 mg daily, and olmesartan was held.     Today, he states that his lower extremity edema has not improved with medication adjustments. He has severe pain in both legs which he attributes to worsening edema. Last week, he suffered a fall off of the curb and hit his right hand hard. He has his right hand bandaged, which is significantly  bruised and swollen. He states that it is hard to bend his legs when walking due to the edema.     He has no improvement in his shortness of breath. He states that his appetite has decreased significantly. " I drink a few sips of water and feel like I just ate a [place] dinner."     He currently denies any chest pain or palpitations.     His last TTE in 01/2019 showed Moderate to severe LVH, LVEF 65-70%, grade II diastolic dysfunction. Moderately increased RV, RVH, Proximal ascending aorta mildly dilated at 4.2 cm, moderate AS, mild-moderate AI, mild MR, moderate TR, severe pulm HTN. Worsening AS (moderate range) from previous echo in April 2018. RVSP was previously not reported but is severely elevated now.     Problem List:   Active Problems:    * No active hospital problems. *      Past Medical History:     Past Medical History:   Diagnosis Date    Arrhythmia     Arthritis     Atrial fibrillation     Atrial flutter     Bipolar affective     BPH (benign prostatic hyperplasia)     Complication of anesthesia     resp. asessment    Diabetes mellitus     Diabetic neuropathy     Gout     Heart murmur     HOH (hard  of hearing)     Hyperlipidemia     Hypertension     Paroxysmal atrial fibrillation     Pulmonary hypertension     Renal insufficiency     Type 2 diabetes mellitus, controlled     Wears glasses        Past Surgical History:     Past Surgical History:   Procedure Laterality Date    APPENDECTOMY      CARDIAC ABLATION      CARDIAC CATHETERIZATION      CARDIOVERSION      x 2    FLUORO-NO CHARGE (HYBRID ROOM ONLY) N/A 11/22/2016    Procedure: FLUORO-NO CHARGE (HYBRID ROOM ONLY);  Surgeon: [name], MD;  [facility] CATH/EP;  Service: Cardiovascular;  Laterality: N/A;    HERNIA REPAIR      PERICARDIOCENTESIS Left 11/25/2016    Procedure: PERICARDIOCENTESIS;  Surgeon: [name], MD;  Location: [facility] CATH/EP;  Service: Cardiovascular;  Laterality: Left;    TEE      TONSILLECTOMY       TONSILLECTOMY, ADENOIDECTOMY         Family History:   family history includes Diabetes in his brother, mother, and sister; Heart disease in his brother and mother; Hyperlipidemia in his brother, brother, and sister; Hypertension in his brother, brother, daughter, mother, and sister; Kidney disease in his brother.    Social History:    reports that he quit smoking about 25 years ago. His smoking use included cigarettes. He has a 12.00 pack-year smoking history. He has never used smokeless tobacco. He reports that he does not drink alcohol or use drugs.    Allergies:   is allergic to codeine.    Medications:     Home Meds:  Home Medications             acetaminophen (TYLENOL) 500 MG tablet     Take 500 mg by mouth every 6 (six) hours as needed for Pain.     allopurinol (ZYLOPRIM) 300 MG tablet     Take 300 mg by mouth daily.     amiodarone (PACERONE) 200 MG tablet     Take 1 tablet (200 mg total) by mouth daily     atenolol (TENORMIN) 100 MG tablet     Take 100 mg by mouth 2 (two) times daily.     divalproex EC/DR tablet (DEPAKOTE EC/DR) 250 MG EC tablet     Take 250 mg by mouth 2 (two) times daily     furosemide (LASIX) 80 MG tablet     Take 80 mg by mouth daily         gabapentin (NEURONTIN) 100 MG capsule     Take 100 mg by mouth 2 (two) times daily.     glimepiride (AMARYL) 1 MG tablet     Take 0.5 mg by mouth every morning before breakfast.         Loperamide HCl (IMODIUM PO)     Take by mouth daily as needed.     metFORMIN (GLUCOPHAGE) 500 MG tablet     Take 1,000 mg by mouth 2 (two) times daily with meals        minoxidil (LONITEN) 10 MG tablet     Take 10 mg by mouth daily May take up to 30mg   Qd if BP is elevated       Multiple Vitamin (MULTIVITAMIN) capsule     Take 1 capsule by mouth daily.     olmesartan (BENICAR)  5 MG tablet     Take 5 mg by mouth daily     Phytonadione (K 100 PO)     Take by mouth 2 (two) times daily         pravastatin (PRAVACHOL) 40 MG tablet     Take 40 mg by mouth every  evening.     rivaroxaban (Xarelto) 15 MG Tab     Take 15 mg by mouth daily with dinner        SENNA CO     by Combination route daily as needed.     tamsulosin (FLOMAX) 0.4 MG Cap     Take 0.4 mg by mouth daily.            Review of Systems:   A comprehensive review of systems was: Otherwise negative except as stated above.    Physical Exam:       Tmax: Temp (24hrs), Avg:97.9 F (36.6 C), Min:97.9 F (36.6 C), Max:97.9 F (36.6 C)     BP: 158/87   Heart Rate: 60   SpO2: 97 %           Constitutional -  Well appearing, and in no distress  Eyes - Pupils equal and reactive, extraocular eye movements intact, sclera anicteric  Head -  Normocephalic, atraumatic  Neck - supple, no significant adenopathy, no thyromegaly, no masses  Oropharynx - Moist mucous membranes  Respiratory - Fine crackles in bilateral bases, normal respiratory effort  Cardiovascular system - RRR, Harsh systolic murmur at LUSB  Abdomen - soft, nontender, nondistended, no masses or organomegaly  Neurological - Alert, oriented, no focal neurological deficits  Extremities -  +3 BLE edema, warm and well perfused. RUE bruised and edematous   Skin - Warm and dry, no rashes, normal color   Psych- Appropriate affect      EKG:     EKG:  Sinus bradycardia 58 bpm    Labs Reviewed:     Recent Labs   Lab 06/14/19  1156   Glucose 89   BUN 37*   Creatinine 2.29*   Sodium 143   Potassium 4.1   Chloride 107   CO2 27     Recent Labs   Lab 06/14/19  1156   AST (SGOT) 19   ALT 11   TSH 4.57*                     Lab Results   Component Value Date    HGBA1CPERCNT 6.5 11/26/2016    HGBA1CPERCNT 6.5 11/15/2016            Radiology Studies in last 24 hours:     No results found.        Signed by: Charissa Bash, NP ,

## 2019-06-21 ENCOUNTER — Inpatient Hospital Stay: Payer: No Typology Code available for payment source

## 2019-06-21 LAB — BASIC METABOLIC PANEL
Anion Gap: 16.4 mMol/L (ref 7.0–18.0)
BUN / Creatinine Ratio: 18.1 Ratio (ref 10.0–30.0)
BUN: 33 mg/dL — ABNORMAL HIGH (ref 7–22)
CO2: 23 mMol/L (ref 20–30)
Calcium: 8.8 mg/dL (ref 8.5–10.5)
Chloride: 108 mMol/L (ref 98–110)
Creatinine: 1.82 mg/dL — ABNORMAL HIGH (ref 0.80–1.30)
EGFR: 37 mL/min/{1.73_m2} — ABNORMAL LOW (ref 60–150)
Glucose: 67 mg/dL — ABNORMAL LOW (ref 71–99)
Osmolality Calculated: 292 mOsm/kg (ref 275–300)
Potassium: 3.4 mMol/L — ABNORMAL LOW (ref 3.5–5.3)
Sodium: 144 mMol/L (ref 136–147)

## 2019-06-21 LAB — VH DEXTROSE STICK GLUCOSE
Glucose POCT: 81 mg/dL (ref 71–99)
Glucose POCT: 180 mg/dL — ABNORMAL HIGH (ref 71–99)

## 2019-06-21 MED ORDER — VH NURSING ALBUMIN SLIDING SCALE
1.00 | Status: DC
Start: 2019-06-21 — End: 2019-06-25

## 2019-06-21 MED ORDER — SODIUM CHLORIDE (PF) 0.9 % IJ SOLN
3.00 mL | Freq: Three times a day (TID) | INTRAMUSCULAR | Status: DC
Start: 2019-06-21 — End: 2019-06-25
  Administered 2019-06-21 – 2019-06-25 (×8): 3 mL via INTRAVENOUS

## 2019-06-21 NOTE — Progress Notes (Signed)
NURSE NOTE SUMMARY  [place] STEP-DOWN   [name]   Attending Physician: [name], DO   Today's date:   06/21/2019 LOS: 1 days   Shift Summary:                                                              7:27 AM- Bedside report completed with off-going RN. Assumed care of patient. Reviewed orders and updated plan of care whiteboard. Appropriate safety interventions in place w/ bed alarm on and audible. Tele intact. IV site intact and lasix infusing. Patient encouraged to call for assist. Will continue to monitor.       Provider Notifications:        Rapid Response Notifications:  Mobility:      PMP Activity: Step 6 - Walks in Room (06/21/2019  3:00 AM)     Weight tracking:  Family Dynamic:   Last 3 Weights for the past 72 hrs (Last 3 readings):   Weight   06/20/19 2006 87.1 kg (192 lb 0.3 oz)   06/20/19 1209 88.6 kg (195 lb 6.4 oz)             Last Bowel Movement   Last BM Date: 06/19/19

## 2019-06-21 NOTE — Progress Notes (Signed)
Cardiology Progress Note       Date Time: 06/21/19 4:04 PM  [name], [name]    LOS: 1 days      Assessment / Plan     68 y.o. male with PMH of atrial fibrillation s/p convergent ablation 2018, type B aortic dissection, HTN, HLD, DM1, bipolar disorder, renal insufficiency, basilar airway disease, pulm HTN, and moderate AS direct admitted to the hospital yesterday. He was placed on IV Lasix gtt yesterday evening and is now net negative -3900 cc. His appetite has improved.     TTE 06/21/2019 noted for LVEF 65-70%, flattened septum in systole and diastole consistent with RV pressure and volume overload. RV systolic function mildly decreased. Moderate AS with peak aortic valve velocity 3.5 m/s and mean pressure gradient of 23 mmHg, mild to moderate AR, mild MR, moderate TR- estimated RVSP 93 mmHg.   No significant change since 12/2017    Renal US: Diffuse abdominal ascites, no hydronephrosis, solid renal masses, or renal stones bilaterally    Given his abdominal ascites, reasonable to consider paracentesis tomorrow  Renal function currently stable, Cr- 1.82.  Continue IV lasix  Continue daily weights      Problem List:   Active Problems:    Diastolic CHF, acute    Subjective :   He states that he is feeling much better than yesterday. " My legs don't feel that heavy." His appetite has returned.       Physical Exam:     Tmax: Temp (24hrs), Avg:97.8 F (36.6 C), Min:97 F (36.1 C), Max:98.4 F (36.9 C)     BP: 120/76   Heart Rate: (!) 54   SpO2: 98 %    Wt Readings from Last 3 Encounters:   06/20/19 87.1 kg (192 lb 0.3 oz)   06/20/19 80.3 kg (177 lb)   02/26/19 79.4 kg (175 lb)               Intake/Output Summary (Last 24 hours) at 06/21/2019 1604  Last data filed at 06/21/2019 1233  Gross per 24 hour   Intake -   Output 3900 ml   Net -3900 ml         Constitutional -  Well appearing, and in no distress  Eyes - Pupils equal and reactive, extraocular eye movements intact, sclera anicteric   Respiratory - Clear to auscultation bilaterally, normal respiratory effort  Cardiovascular - RRR, soft systolic murmur noted at LUSB. JVP 5 cm  Abdomen - distended  Neurological - Alert, oriented, no focal neurological deficits  Extremities -  +3 edema on BLE  Skin - Warm and dry, no rashes, normal color         Medications:   allopurinol, 300 mg, Oral, Daily  amiodarone, 200 mg, Oral, Daily  atenolol, 100 mg, Oral, BID  divalproex EC/DR tablet, 250 mg, Oral, BID  gabapentin, 100 mg, Oral, BID  glimepiride, 0.5 mg, Oral, QAM AC  pravastatin, 40 mg, Oral, QPM  rivaroxaban, 15 mg, Oral, Daily with dinner  sodium chloride (PF), 3 mL, Intravenous, Q8H  tamsulosin, 0.4 mg, Oral, Daily      Current Facility-Administered Medications   Medication   . furosemide (LASIX) infusion 500 MG/50 mL         Labs:     Recent Labs   Lab 06/21/19  0637 06/20/19  1338   Glucose 67* 85   BUN 33* 37*   Creatinine 1.82* 1.86*   Sodium 144 144   Potassium 3.4*  3.8   Chloride 108 108   CO2 23 27     Recent Labs   Lab 06/20/19  1338   AST (SGOT) 18   ALT 11                     Lab Results   Component Value Date    HGBA1CPERCNT 6.5 11/26/2016    HGBA1CPERCNT 6.5 11/15/2016          Estimated Creatinine Clearance: 39.8 mL/min (A) (based on SCr of 1.82 mg/dL (H)).    Radiology tests last 24 hours:   US Renal Kidney Bladder Complete    Result Date: 06/21/2019  IMPRESSION:  1. Diffuse abdominal ascites. 2. No hydronephrosis, solid renal masses or renal stones bilaterally. ReadingStation:[id number]             Signed by: Charissa Bash, NP,

## 2019-06-21 NOTE — Consults (Signed)
Cardiac rehab is not indicated at this time.

## 2019-06-21 NOTE — Nursing Progress Note (Signed)
NURSE NOTE SUMMARY  [place] STEP-DOWN   [name]   Attending Physician: [name], DO   Today's date:   06/21/2019 LOS: 1 days   Shift Summary:                                                              Assumed care of patient from day shift RN. Patient resting comfortably and speaking with family on the phone. Call bell and patient belongings are within reach. Patient bed alarm is on and bed is in the lowest position. PIV CDI. Tele intact. Will continue to monitor       Provider Notifications:        Rapid Response Notifications:  Mobility:      PMP Activity: Step 6 - Walks in Room (06/21/2019  3:00 AM)     Weight tracking:  Family Dynamic:   Last 3 Weights for the past 72 hrs (Last 3 readings):   Weight   06/20/19 2006 87.1 kg (192 lb 0.3 oz)   06/20/19 1209 88.6 kg (195 lb 6.4 oz)             Last Bowel Movement   Last BM Date: 06/19/19

## 2019-06-21 NOTE — UM Notes (Signed)
[facility] Utilization Management Review Sheet    Facility :  [facility]    NAME: [name]  MR#: [id number]    CSN#: [id number]    ROOM: 373/373-A AGE: 68 y.o.    ADMIT DATE AND TIME: 06/20/2019 12:00 PM      PATIENT CLASS: Inpatient     ATTENDING PHYSICIAN: [name], DO  PAYOR:Payor: MEDICARE [employer] / Plan: [place] MED ADV [phone] / Product Type: MANAGED MEDICARE /       AUTH #: [id number]    HISTORY:   Past Medical History:   Diagnosis Date   . Arrhythmia    . Arthritis    . Atrial fibrillation    . Atrial flutter    . Bipolar affective    . BPH (benign prostatic hyperplasia)    . Complication of anesthesia     resp. asessment   . Diabetes mellitus    . Diabetic neuropathy    . Gout    . Heart murmur    . HOH (hard of hearing)    . Hyperlipidemia    . Hypertension    . Paroxysmal atrial fibrillation    . Pulmonary hypertension    . Renal insufficiency    . Type 2 diabetes mellitus, controlled    . Wears glasses        DATE OF REVIEW: 06/21/2019    VITALS: BP 121/68   Pulse 60   Temp 97.5 F (36.4 C) (Temporal)   Resp 17   Ht 1.702 m (5\' 7" )   Wt 87.1 kg (192 lb 0.3 oz)   SpO2 96%   BMI 30.07 kg/m     Active Hospital Problems    Diagnosis   . Diastolic CHF, acute     Admitted from MD's office: "68 y.o male with PMH of atrial fibrillation s/p convergent ablation, type B aortic dissection, HTN, HLD, DM1, bipolar disorder, renal insufficiency, basilar airway disease, pulm HTN, and moderate AS is direct admitted from the clinic for CHF exacerbation. Previously attempted to adjust his diuretics with no improvement in lower extremity edema or shortness of breath.  BUN/creatinine increased to 37/2.29. Will admit under Dr. [name] for IV diuresis and close monitoring of renal function. "    Labs: creatinine 2.29, BUN 37, EGFR 28, TSH 4.57, potassium 3.4    US renal kidney bladder: "1. Diffuse abdominal ascites.  2. No hydronephrosis, solid renal masses or renal stones  bilaterally."    Meds: allopurinol qd, amiodarone qd, tenormin bid, depakote bid, neurontin bid, amaryl qd, xarelto qd, flomax qd, continuous Lasix infusion, tylenol x 1    Plans: admitted with CHF exacerbation, Lasix continuous infusion, echocardiogram in progress, pain management, dvt prophylaxis, cardiac diet, strict I&O, telemetry monitoring    [facility]  Utilization Review  P [phone]  F [phone]

## 2019-06-21 NOTE — Progress Notes (Signed)
Readmission Risk  [place] STEP-DOWN   [name]   Attending Physician: [name], DO   Today's date:   06/21/2019 LOS: 1 days   Expected Discharge Date      Readmission Assessment:                                                              Discharge Planning  ReAdmit Risk Score: 14  Does the patient have perscription coverage?: Yes  CM Comments: RNCM, AKP- M: 11/12: CHF. ascites. cardio following. echo. diuretics. PMH: a-fib, ablation, HTN, DM, bipolar, pulm. HTN. Lives alone. Supportive son, Roxy Manns. Independent in ADL's prior to admission. Denies d/c needs at this time.       IDPA:   Patient Type  Within 30 Days of Previous Admission?: No  Healthcare Decisions  Interviewed:: Patient  Orientation/Decision Making Abilities of Patient: Alert and Oriented x3, able to make decisions  Advance Directive: Patient does not have advance directive  Healthcare Agent Appointed: No  Prior to admission  Prior level of function: Independent with ADLs, Ambulates with assistive device  Type of Residence: Private residence  Home Layout: Multi-level, Able to live on main level with bedroom/bathroom, Stairs to enter with rails (add number in comment)  Have running water, electricity, heat, etc?: Yes  Living Arrangements: Alone  How do you get to your MD appointments?: self  How do you get your groceries?: self  Who fixes your meals?: self  Who does your laundry?: self  Who picks up your prescriptions?: self  Dressing: Independent  Grooming: Independent  Feeding: Independent  Bathing: Independent  Toileting: Independent  DME Currently at Home: Other (Comment), Walker, Four Wheel, [place], Single Point(tub bench)  Home Care/Community Services: None  Adult Scientist, forensic (APS) involved?: No  Discharge Planning  Support Systems: Children, Family members  Patient expects to be discharged to:: return home.  Anticipated  plan discussed with:: Same as interviewed  Mode  of transportation:: Private car (family member)  Does the patient have perscription coverage?: Yes  Consults/Providers  PT Evaluation Needed: Yes (Comment)  OT Evalulation Needed: No  SLP Evaluation Needed: No  Correct PCP listed in Epic?: Yes      30 Day Readmission:       Provider Notifications:          Najeeb Uptain K. Pinner-Luann Aspinwall, RN BSN  Case Freight forwarder   [employer]  [phone]

## 2019-06-22 ENCOUNTER — Inpatient Hospital Stay: Payer: No Typology Code available for payment source

## 2019-06-22 HISTORY — PX: PARACENTESIS: SHX844

## 2019-06-22 LAB — VH LAB SPECIMEN YES

## 2019-06-22 LAB — BASIC METABOLIC PANEL
Anion Gap: 13.4 mMol/L (ref 7.0–18.0)
BUN / Creatinine Ratio: 18.3 Ratio (ref 10.0–30.0)
BUN: 34 mg/dL — ABNORMAL HIGH (ref 7–22)
CO2: 27 mMol/L (ref 20–30)
Calcium: 8.3 mg/dL — ABNORMAL LOW (ref 8.5–10.5)
Chloride: 107 mMol/L (ref 98–110)
Creatinine: 1.86 mg/dL — ABNORMAL HIGH (ref 0.80–1.30)
EGFR: 36 mL/min/{1.73_m2} — ABNORMAL LOW (ref 60–150)
Glucose: 86 mg/dL (ref 71–99)
Osmolality Calculated: 294 mOsm/kg (ref 275–300)
Potassium: 3.4 mMol/L — ABNORMAL LOW (ref 3.5–5.3)
Sodium: 144 mMol/L (ref 136–147)

## 2019-06-22 LAB — VH PH BODY FLUID: Body Fluid pH: 7.53 pH

## 2019-06-22 LAB — VH CELL COUNT BODY FLUID
Body Fluid Eosinophils: 0 %
Body Fluid Lymphocytes: 63 %
Body Fluid Monocytes: 35 %
Body Fluid Neutrophil Count: 2 %
Body Fluid RBC: 7256 /mm3
Total NUCLEATED CELL COUNT: 204 /mm3

## 2019-06-22 LAB — VH LDH BODY FLUID: Body Fluid LDH: 127 U/L

## 2019-06-22 NOTE — Progress Notes (Signed)
NURSE NOTE SUMMARY  [place] STEP-DOWN   [name]   Attending Physician: [name], DO   Today's date:   06/22/2019 LOS: 2 days   Shift Summary:                                                              9604 - Bedside report completed with off-going RN. Assumed care of patient. Reviewed orders and updated plan of care whiteboard. Appropriate safety interventions in place w/ bed alarm on and audible. Tele intact. IV site intact and saline locked. Patient encouraged to call for assist. Will continue to monitor.       Provider Notifications:        Rapid Response Notifications:  Mobility:      PMP Activity: Step 6 - Walks in Room (06/22/2019  1:00 AM)     Weight tracking:  Family Dynamic:   Last 3 Weights for the past 72 hrs (Last 3 readings):   Weight   06/21/19 2013 117 kg (257 lb 15 oz)   06/20/19 2006 87.1 kg (192 lb 0.3 oz)   06/20/19 1209 88.6 kg (195 lb 6.4 oz)             Last Bowel Movement   Last BM Date: 06/19/19

## 2019-06-22 NOTE — Nursing Progress Note (Signed)
NURSE NOTE SUMMARY  [place] STEP-DOWN   [name]   Attending Physician: [name], DO   Today's date:   06/22/2019 LOS: 2 days   Shift Summary:                                                                Assumed care of patient from day shift RN. Patient resting in bed comfortably with family at bedside. Lasix infusing at 10 mg/hr. PIV CDI and tele intact. Call bell and patient belongings are within reach. Bed alarm is on. Patient expresses no additional needs at this time. Will continue to monitor.     12:00 AM: Patient now NPO for possible paracentesis in am.    Provider Notifications:        Rapid Response Notifications:  Mobility:      PMP Activity: Step 6 - Walks in Room (06/22/2019  1:00 AM)     Weight tracking:  Family Dynamic:   Last 3 Weights for the past 72 hrs (Last 3 readings):   Weight   06/21/19 2013 117 kg (257 lb 15 oz)   06/20/19 2006 87.1 kg (192 lb 0.3 oz)   06/20/19 1209 88.6 kg (195 lb 6.4 oz)             Last Bowel Movement   Last BM Date: 06/19/19

## 2019-06-22 NOTE — Provider Clarification Note (Signed)
CDI PROVIDER CLARIFICATION                                                                       Date of Request:  06/22/2019   [name], [name]  Account #: [id number]   [place] Date: 06/20/2019      Dear Dr. [name],     There is conflicting documentation in the chart regarding diabetes.  Both DM1 and Type 2 diabetes mellitus documented in record.  Pt does not seem to be on insulin at home.  Please clarify type of diabetes patient has:    A.  Type 2 diabetes mellitus  B.  Type 1 diabetes mellitus    PHYSICIAN RESPONSE:        Type II Diabetes Mellitus.       Roslyn Smiling, RN  CDI Specialist  [phone]  Date:  06/22/2019         This form is considered part of the permanent legal medical record.

## 2019-06-22 NOTE — Progress Notes (Signed)
[facility] Cardiology and Vascular Medicine - [facility]  Progress Note       Date Time: 06/22/19 6:49 PM  [name],[name]    LOS: 2 days      Assessment / Plan     1. Right heart failure. Excellent diuresis. Would continue IV diuretics for 48 more hours. D/C IV lasix Sunday to oral torsemide 20mg  twice daily.   2. AF - maintaining sinus rhythm.   3. S/P Paracentesis today.   4. Type B aortic dissection - stable on CT. Moderate AI.   5. Stage IIIa CKD - creatinine has improved.       Problem List:   Active Problems:    Diastolic CHF, acute        Subjective   Subjective :   Feels much better. Discussed plan with patient.          Objective     Physical Exam:     Tmax: Temp (24hrs), Avg:98 F (36.7 C), Min:97.5 F (36.4 C), Max:98.4 F (36.9 C)     BP: 139/84   Heart Rate: (!) 50   SpO2: 97 %    Wt Readings from Last 3 Encounters:   06/21/19 117 kg (257 lb 15 oz)   06/20/19 80.3 kg (177 lb)   02/26/19 79.4 kg (175 lb)               Intake/Output Summary (Last 24 hours) at 06/22/2019 1849  Last data filed at 06/22/2019 1709  Gross per 24 hour   Intake 720 ml   Output 6100 ml   Net -5380 ml         Constitutional -  Well appearing, and in no distress  Eyes - Pupils equal and reactive, extraocular eye movements intact, sclera anicteric  Respiratory - Clear to auscultation bilaterally, normal respiratory effort  Cardiovascular - Regular, soft diastolic murmur noted.   Abdomen - soft, nontender, nondistended, no masses or organomegaly  Neurological - Alert, oriented, no focal neurological deficits  Extremities -  Warm and dry.   Skin - Warm and dry, no rashes, normal color         Medications:   allopurinol, 300 mg, Oral, Daily  amiodarone, 200 mg, Oral, Daily  atenolol, 100 mg, Oral, BID  divalproex EC/DR tablet, 250 mg, Oral, BID  gabapentin, 100 mg, Oral, BID  glimepiride, 0.5 mg, Oral, QAM AC  nursing albumin sliding scale, 1 each, Does not apply, See Admin  Instructions  pravastatin, 40 mg, Oral, QPM  rivaroxaban, 15 mg, Oral, Daily with dinner  sodium chloride (PF), 3 mL, Intravenous, Q8H  sodium chloride (PF), 3 mL, Intravenous, Q8H  tamsulosin, 0.4 mg, Oral, Daily      Current Facility-Administered Medications   Medication   . furosemide (LASIX) infusion 500 MG/50 mL         Labs:     Recent Labs   Lab 06/22/19  0400 06/21/19  0637 06/20/19  1338   Glucose 86 67* 85   BUN 34* 33* 37*   Creatinine 1.86* 1.82* 1.86*   Sodium 144 144 144   Potassium 3.4* 3.4* 3.8   Chloride 107 108 108   CO2 27 23 27      Recent Labs   Lab 06/20/19  1338   AST (SGOT) 18   ALT 11                     Lab Results   Component Value  Date    HGBA1CPERCNT 6.5 11/26/2016    HGBA1CPERCNT 6.5 11/15/2016          Estimated Creatinine Clearance: 45.2 mL/min (A) (based on SCr of 1.86 mg/dL (H)).    Radiology tests last 24 hours:   US Paracentesis    Result Date: 06/22/2019  IMPRESSION: Successful ultrasound-guided paracentesis. ReadingStation:[id number]             Signed by: [name], DO,

## 2019-06-22 NOTE — Progress Notes (Signed)
NURSE NOTE SUMMARY  [place] STEP-DOWN   [name]   Attending Physician: [name], DO   Today's date:   06/22/2019 LOS: 2 days   Shift Summary:                                                              9242 Bedside report completed with off-going RN. Assumed care of patient. Reviewed orders and updated plan of care whiteboard. Appropriate safety interventions in place w/ bed alarm on and audible. Tele intact. IV site intact and infusing lasix gtts 1 mL/hr . Patient resting in bed w/ no needs expressed at this time. Patient encouraged to call for assist.    Provider Notifications:        Rapid Response Notifications:  Mobility:      PMP Activity: Step 6 - Walks in Room (06/22/2019  8:25 AM)     Weight tracking:  Family Dynamic:   Last 3 Weights for the past 72 hrs (Last 3 readings):   Weight   06/22/19 1934 81.1 kg (178 lb 12.7 oz)   06/21/19 2013 117 kg (257 lb 15 oz)   06/20/19 2006 87.1 kg (192 lb 0.3 oz)             Last Bowel Movement   Last BM Date: 06/19/19

## 2019-06-22 NOTE — Consults (Signed)
Inpatient Consult to Interventional Radiology  Consult performed by: Simonne Maffucci, RA  Consult ordered by: [name], DO      Ultrasound guided paracentesis was performed.   3 vials of clear yellow fluid was removed and sent to the lab for analysis.   Patient tolerated the procedure well, no immediate complications.   Procedure performed with Dr. Silverio Decamp.  For complete procedural details and total volume removed see radiology report.     Dorthula Perfect [facility]    [phone]

## 2019-06-23 LAB — BASIC METABOLIC PANEL
Anion Gap: 12.6 mMol/L (ref 7.0–18.0)
BUN / Creatinine Ratio: 17.5 Ratio (ref 10.0–30.0)
BUN: 33 mg/dL — ABNORMAL HIGH (ref 7–22)
CO2: 30 mMol/L (ref 20–30)
Calcium: 8.8 mg/dL (ref 8.5–10.5)
Chloride: 105 mMol/L (ref 98–110)
Creatinine: 1.89 mg/dL — ABNORMAL HIGH (ref 0.80–1.30)
EGFR: 35 mL/min/{1.73_m2} — ABNORMAL LOW (ref 60–150)
Glucose: 93 mg/dL (ref 71–99)
Osmolality Calculated: 294 mOsm/kg (ref 275–300)
Potassium: 3.6 mMol/L (ref 3.5–5.3)
Sodium: 144 mMol/L (ref 136–147)

## 2019-06-23 NOTE — Progress Notes (Addendum)
Received report and assumed care.  Telemetry intact.  SB with first degree block.  IV patent.  Lasix gtt infusing.  Voiding frequently per urinal.  Report given.

## 2019-06-23 NOTE — Progress Notes (Signed)
NURSE NOTE SUMMARY  [place] STEP-DOWN   [name]   Attending Physician: [name], DO   Today's date:   06/23/2019 LOS: 3 days   Shift Summary:                                                              1915 Bedside report completed with off-going RN. Assumed care of patient. Reviewed orders and updated plan of care whiteboard. Appropriate safety interventions in place w/ bed alarm on and audible. Tele intact. IV site intact and infusing lasix gtts 1 mL/hr. Patient encouraged to call for assist. Will continue to monitor.   Provider Notifications:        Rapid Response Notifications:  Mobility:      PMP Activity: Step 6 - Walks in Room (06/23/2019 10:00 PM)     Weight tracking:  Family Dynamic:   Last 3 Weights for the past 72 hrs (Last 3 readings):   Weight   06/23/19 1959 79 kg (174 lb 2.6 oz)   06/22/19 1934 81.1 kg (178 lb 12.7 oz)   06/21/19 2013 117 kg (257 lb 15 oz)             Last Bowel Movement   Last BM Date: 06/19/19

## 2019-06-23 NOTE — Progress Notes (Signed)
[facility] Cardiology and Vascular Medicine - [facility]  Progress Note       Date Time: 06/23/19 8:36 AM  [name] [name],[name] [name] [name]    LOS: 3 days      Assessment / Plan     Right heart failure. Excellent diuresis. Would continue IV diuretics for 48 more hours. D/C IV lasix Sunday to oral torsemide 20mg  twice daily.   AF - maintaining sinus rhythm.   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 Moderate AI.   Stage IIIa CKD - creatinine has improved.     Check BNP on day of discharge.  Patient would benefit from referral to the heart failure clinic within 7 to 10 days of discharge for i-STAT lab work and further optimization of volume status and adjustment of diuretics as indicated.      Problem List:   Active Problems:    Diastolic CHF, acute        Subjective   Subjective :   Feels much better.  Ambulating with more ease.  Currently sitting up in bed having breakfast.  Net neg 5.5L/24 hours on Lasix drip.  Still with significant swelling in the legs.         Objective     Physical Exam:     Tmax: Temp (24hrs), Avg:98.1 F (36.7 C), Min:97.5 F (36.4 C), Max:99.1 F (37.3 C)     BP: 153/83   Heart Rate: (!) 51   SpO2: 97 %    Wt Readings from Last 3 Encounters:   06/22/19 81.1 kg (178 lb 12.7 oz)   06/20/19 80.3 kg (177 lb)   02/26/19 79.4 kg (175 lb)               Intake/Output Summary (Last 24 hours) at 06/23/2019 0836  Last data filed at 06/23/2019 0759  Gross per 24 hour   Intake 720 ml   Output 6225 ml   Net -5505 ml         Constitutional -  Well appearing, and in no distress  Eyes - Pupils equal and reactive, extraocular eye movements intact, sclera anicteric  Respiratory - Clear to auscultation bilaterally, normal respiratory effort  Cardiovascular -moderate JVD, regular, soft diastolic murmur noted.   Abdomen - soft, nontender, nondistended, no masses or organomegaly  Neurological - Alert, oriented, no focal neurological deficits  Extremities -  Warm and dry.   2+ pitting lower extremity edema to the knees bilateral.  Skin - Warm and dry, no rashes, normal color         Medications:   allopurinol, 300 mg, Oral, Daily  amiodarone, 200 mg, Oral, Daily  atenolol, 100 mg, Oral, BID  divalproex EC/DR tablet, 250 mg, Oral, BID  gabapentin, 100 mg, Oral, BID  glimepiride, 0.5 mg, Oral, QAM AC  nursing albumin sliding scale, 1 each, Does not apply, See Admin Instructions  pravastatin, 40 mg, Oral, QPM  rivaroxaban, 15 mg, Oral, Daily with dinner  sodium chloride (PF), 3 mL, Intravenous, Q8H  sodium chloride (PF), 3 mL, Intravenous, Q8H  tamsulosin, 0.4 mg, Oral, Daily      Current Facility-Administered Medications   Medication   . furosemide (LASIX) infusion 500 MG/50 mL         Labs:     Recent Labs   Lab 06/23/19  0711 06/22/19  0400 06/21/19  0637   Glucose 93 86 67*   BUN 33* 34* 33*   Creatinine 1.89* 1.86* 1.82*  Sodium 144 144 144   Potassium 3.6 3.4* 3.4*   Chloride 105 107 108   CO2 30 27 23      Recent Labs   Lab 06/20/19  1338   AST (SGOT) 18   ALT 11                     Lab Results   Component Value Date    HGBA1CPERCNT 6.5 11/26/2016    HGBA1CPERCNT 6.5 11/15/2016          Estimated Creatinine Clearance: 37.1 mL/min (A) (based on SCr of 1.89 mg/dL (H)).    Radiology tests last 24 hours:   US Paracentesis    Result Date: 06/22/2019  IMPRESSION: Successful ultrasound-guided paracentesis. ReadingStation:[id number]             Signed by: Rosebud Poles, MD,

## 2019-06-24 LAB — BASIC METABOLIC PANEL
Anion Gap: 10.5 mMol/L (ref 7.0–18.0)
BUN / Creatinine Ratio: 17.4 Ratio (ref 10.0–30.0)
BUN: 26 mg/dL — ABNORMAL HIGH (ref 7–22)
CO2: 31 mMol/L — ABNORMAL HIGH (ref 20–30)
Calcium: 8.5 mg/dL (ref 8.5–10.5)
Chloride: 103 mMol/L (ref 98–110)
Creatinine: 1.49 mg/dL — ABNORMAL HIGH (ref 0.80–1.30)
EGFR: 47 mL/min/{1.73_m2} — ABNORMAL LOW (ref 60–150)
Glucose: 152 mg/dL — ABNORMAL HIGH (ref 71–99)
Osmolality Calculated: 289 mOsm/kg (ref 275–300)
Potassium: 3.5 mMol/L (ref 3.5–5.3)
Sodium: 141 mMol/L (ref 136–147)

## 2019-06-24 LAB — MAGNESIUM: Magnesium: 2.4 mg/dL (ref 1.6–2.6)

## 2019-06-24 MED ORDER — TORSEMIDE 20 MG PO TABS
20.0000 mg | ORAL_TABLET | Freq: Two times a day (BID) | ORAL | Status: DC
Start: 2019-06-24 — End: 2019-06-25
  Administered 2019-06-24 – 2019-06-25 (×2): 20 mg via ORAL
  Filled 2019-06-24 (×3): qty 1

## 2019-06-24 NOTE — Progress Notes (Signed)
[facility] - [facility]  Progress Note       Date Time: 06/24/19 8:35 AM  [name]    LOS: 4 days      Assessment / Plan     Right heart failure. Excellent diuresis. Would continue IV diuretics for 48 more hours. D/C IV lasix Sunday to oral torsemide 20mg  twice daily.   AF - maintaining sinus rhythm.   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 Moderate AI.   Stage IIIa CKD - creatinine has improved.       We will transition from IV Lasix to oral torsemide 20 mg twice a day.  Check BNP on day of discharge.    Patient would benefit from referral to the heart failure clinic within 7 to 10 days of discharge for i-STAT lab work and further optimization of volume status and adjustment of diuretics as indicated.      Problem List:   Active Problems:    Diastolic CHF, acute        Subjective   Subjective :   Feels much better.  Ambulating with more ease.    Net -2.5 L over 24 hours.  Weight down to 174 pounds.  Still with some swelling in the legs.           Objective     Physical Exam:     Tmax: Temp (24hrs), Avg:98.2 F (36.8 C), Min:97.5 F (36.4 C), Max:98.8 F (37.1 C)     BP: 155/84   Heart Rate: (!) 54   SpO2: 95 %    Wt Readings from Last 3 Encounters:   06/23/19 79 kg (174 lb 2.6 oz)   06/20/19 80.3 kg (177 lb)   02/26/19 79.4 kg (175 lb)               Intake/Output Summary (Last 24 hours) at 06/24/2019 0835  Last data filed at 06/24/2019 1572  Gross per 24 hour   Intake 1352 ml   Output 3875 ml   Net -2523 ml         Constitutional -  Well appearing, and in no distress  Eyes - Pupils equal and reactive, extraocular eye movements intact, sclera anicteric  Respiratory - Clear to auscultation bilaterally, normal respiratory effort  Cardiovascular -mild JVD, regular, soft diastolic murmur noted.   Abdomen - soft, nontender, nondistended, no masses or organomegaly  Neurological - Alert, oriented, no focal neurological  deficits  Extremities -  Warm and dry.  Trace lower extremity edema  Skin - Warm and dry, no rashes, normal color         Medications:   allopurinol, 300 mg, Oral, Daily  amiodarone, 200 mg, Oral, Daily  atenolol, 100 mg, Oral, BID  divalproex EC/DR tablet, 250 mg, Oral, BID  gabapentin, 100 mg, Oral, BID  glimepiride, 0.5 mg, Oral, QAM AC  nursing albumin sliding scale, 1 each, Does not apply, See Admin Instructions  pravastatin, 40 mg, Oral, QPM  rivaroxaban, 15 mg, Oral, Daily with dinner  sodium chloride (PF), 3 mL, Intravenous, Q8H  sodium chloride (PF), 3 mL, Intravenous, Q8H  tamsulosin, 0.4 mg, Oral, Daily      Current Facility-Administered Medications   Medication   . furosemide (LASIX) infusion 500 MG/50 mL         Labs:     Recent Labs   Lab 06/23/19  0711 06/22/19  0400 06/21/19  0637   Glucose 93  86 67*   BUN 33* 34* 33*   Creatinine 1.89* 1.86* 1.82*   Sodium 144 144 144   Potassium 3.6 3.4* 3.4*   Chloride 105 107 108   CO2 30 27 23      Recent Labs   Lab 06/20/19  1338   AST (SGOT) 18   ALT 11                     Lab Results   Component Value Date    HGBA1CPERCNT 6.5 11/26/2016    HGBA1CPERCNT 6.5 11/15/2016          Estimated Creatinine Clearance: 34 mL/min (A) (based on SCr of 1.89 mg/dL (H)).    Radiology tests last 24 hours:   No results found.           Signed by: Rosebud Poles, MD,

## 2019-06-24 NOTE — Progress Notes (Signed)
NURSE NOTE SUMMARY  [place] STEP-DOWN   Patient Name: [name]   Attending Physician: [name], DO   Today's date:   06/24/2019 LOS: 4 days   Shift Summary:                                                              1915 Bedside report completed with off-going RN. Assumed care of patient. Reviewed orders and updated plan of care whiteboard. Appropriate safety interventions in place w/ bed alarm on and audible. Tele intact. IV site intact and saline locked. Patient resting in bed with no needs expressed at this time, pt encouraged to call for assistance.   Provider Notifications:        Rapid Response Notifications:  Mobility:      PMP Activity: Step 6 - Walks in Room (06/24/2019  7:23 AM)     Weight tracking:  Family Dynamic:   Last 3 Weights for the past 72 hrs (Last 3 readings):   Weight   06/23/19 1959 79 kg (174 lb 2.6 oz)   06/22/19 1934 81.1 kg (178 lb 12.7 oz)   06/21/19 2013 117 kg (257 lb 15 oz)             Last Bowel Movement   Last BM Date: 06/19/19

## 2019-06-25 ENCOUNTER — Encounter: Payer: Self-pay | Admitting: Family

## 2019-06-25 ENCOUNTER — Encounter: Payer: Self-pay | Admitting: Internal Medicine

## 2019-06-25 MED ORDER — ATENOLOL 100 MG PO TABS
100.0000 mg | ORAL_TABLET | Freq: Every day | ORAL | 3 refills | Status: DC
Start: 2019-06-25 — End: 2019-08-30

## 2019-06-25 MED ORDER — TORSEMIDE 20 MG PO TABS
20.0000 mg | ORAL_TABLET | Freq: Two times a day (BID) | ORAL | 3 refills | Status: DC
Start: 2019-06-25 — End: 2019-06-29

## 2019-06-25 MED ORDER — POTASSIUM CHLORIDE ER 10 MEQ PO TBCR
10.00 meq | EXTENDED_RELEASE_TABLET | Freq: Every day | ORAL | 3 refills | Status: DC
Start: 2019-06-25 — End: 2019-06-29

## 2019-06-25 NOTE — Retrospective Coding Query (Signed)
PHYSICIAN'S DOCUMENTATION                                                                      REQUEST                                                                         Date of Request:  06/25/2019  Type of Request:  DOCUMENTATION CLARIFICATION                                         [name], [name]  Account #: [id number]  MR #: [id number]  Discharge Date: 06/25/2019       Dear Dr. [name],     The medical record reflects the following documentation of diabetes, however, the type needs clarification:    H&P: "DM1" and "Type 2 diabetes mellitus"    DS: "DM1"    Home medication list does not include insulin.     Question to Physician: Please clarify the type of diabetes by choosing one of the following:    1) Type 2   Patient has been diagnosed with Type II Diabetes.   2) Type 1  3) Other (please specify) ________________________    PHYSICIAN RESPONSE:      Type II Diabetes.       Thank you for your time.     Coder Mariana Arn Murvin Natal  Date 06/25/2019         This form is considered part of the permanent legal medical record.

## 2019-06-25 NOTE — Discharge Instr - AVS First Page (Signed)
Activity: As Tolerated.  Weigh every morning      Diet:  Cardiac Consistent Carbohydrates  Low Saturated Fat / Low Cholesterol Diet  2 Gram Sodium (Salt) Restricted Diet  Fluid Restriction Diet  1500 ml per day      Notify Physician:  Shortness of breath, Swelling, Vomiting and diarrhea, Fever, Chest pain, Unrelieved pain and weight gain of 3 pounds in 1 day or 5 pounds in 1 week    Should your symptoms worsen or recur, contact your physician.    In the event of an emergency, severe shortness of breath, unrelieved chest pain or chest pressure / tightness, dial 911.

## 2019-06-25 NOTE — Progress Notes (Addendum)
NURSE NOTE SUMMARY  [place] STEP-DOWN   [name]   Attending Physician: [name], DO   Today's date:   06/25/2019 LOS: 5 days   Shift Summary:                                                              0600: Assumed care of patient, respirations even and unlabored, tele monitored, VSS, bed in lowest position with wheels locked, call bell within reach. Patient denies needs at this time. Will continue to monitor.    1319: IV and tele removed. Discharge instructions reviewed. Transport wheelchair ordered to d/c lounge. Patient's ride notified. VSS.    Provider Notifications:        Rapid Response Notifications:  Mobility:      PMP Activity: Step 6 - Walks in Room (06/24/2019  8:00 PM)     Weight tracking:  Family Dynamic:   Last 3 Weights for the past 72 hrs (Last 3 readings):   Weight   06/24/19 2155 77.8 kg (171 lb 8.3 oz)   06/23/19 1959 79 kg (174 lb 2.6 oz)   06/22/19 1934 81.1 kg (178 lb 12.7 oz)             Last Bowel Movement   Last BM Date: 06/19/19

## 2019-06-25 NOTE — Discharge Summary (Signed)
Cardiology Discharge Summary     [name] [name], [name]  MRN#: [id number]  DOB: 10/11/1950  PCP: [name], MD  Attending: [name], DO  Primary Cardiologist: Dr. Bronson Curb  Date of Admission: 06/20/2019  Date of Discharge: 06/25/2019  LOS: 5 days     Reason for Admission:   CHF (congestive heart failure) [Q76.2]  Diastolic CHF, acute [U63.33]    Discharge Diagnoses:    Active Problems:    Diastolic CHF, acute  Resolved Problems:    * No resolved hospital problems. *      Hospital Course:   68 y.o. male with PMH of atrial fibrillation s/p convergent ablation 2018, type B aortic dissection, HTN, HLD, DM1, bipolar disorder, renal insufficiency, basilar airway disease, pulm HTN, and moderate AS direct admitted to the hospital with acute RH failure exacerbation after failing outpatient diuresis. He was placed on a lasix drip with good diuresis.     TTE 06/21/2019 noted for LVEF 65-70%, flattened septum in systole and diastole consistent with RV pressure and volume overload. RV systolic function mildly decreased. Moderate AS with peak aortic valve velocity 3.5 m/s and mean pressure gradient of 23 mmHg, mild to moderate AR, mild MR, moderate TR- estimated RVSP 93 mmHg.     He had diffuse abdominal ascites and underwent paracentesis 06/22/19. He did well, renal function returned to baseline (around 1.5). Significant symptom improvement. He was transitioned from IV lasix to PO torsemide. Atenolol was held for bradycardia and will be reduced to 100 mg daily at [place]. He was started on KCL 10 meq and will f/u in 1 week in CHF clinic with labs. Discharge creatinine was 1.49. Benicar will be held with plan to restart/ optimize meds as outpatient.     Discharge Follow Up:   [facility] HEART FAILURE  [place]  [phone]  Schedule an appointment as soon as possible for a visit in 1 week(s)      [name], [place]  [place] [id number]  [phone]    Call in 4 week(s)        Discharge Medications:        Discharge Medication List      Taking    acetaminophen 500 MG tablet  Dose: 500 mg  Commonly known as: TYLENOL  Take 500 mg by mouth every 6 (six) hours as needed for Pain.     allopurinol 300 MG tablet  Dose: 300 mg  Commonly known as: ZYLOPRIM  Take 300 mg by mouth daily.     amiodarone 200 MG tablet  Dose: 200 mg  Commonly known as: PACERONE  For: Atrial Fibrillation  Take 1 tablet (200 mg total) by mouth daily     atenolol 100 MG tablet  Dose: 100 mg  What changed: when to take this  Commonly known as: TENORMIN  Take 1 tablet (100 mg total) by mouth daily     divalproex EC/DR tablet 250 MG EC tablet  Dose: 250 mg  Commonly known as: DEPAKOTE EC/DR  Take 250 mg by mouth 2 (two) times daily     gabapentin 100 MG capsule  Dose: 100 mg  Commonly known as: NEURONTIN  Take 100 mg by mouth 2 (two) times daily.     glimepiride 1 MG tablet  Dose: 0.5 mg  Commonly known as: AMARYL  Take 0.5 mg by mouth every morning before breakfast.     IMODIUM PO  Take by mouth daily as needed.     metFORMIN 500 MG tablet  Dose: 1,000 mg  Commonly known as: GLUCOPHAGE  Take 1,000 mg by mouth 2 (two) times daily with meals      minoxidil 10 MG tablet  Dose: 10 mg  Commonly known as: LONITEN  Take 10 mg by mouth daily May take up to 30mg   Qd if BP is elevated     multivitamin capsule  Dose: 1 capsule  Take 1 capsule by mouth daily.     potassium chloride 10 MEQ tablet  Dose: 10 mEq  Commonly known as: K-DUR  Take 1 tablet (10 mEq total) by mouth daily  Replaces: POTASSIUM CHLORIDE PO     pravastatin 40 MG tablet  Dose: 40 mg  Commonly known as: PRAVACHOL  Take 40 mg by mouth every evening.     SENNA CO  by Combination route daily as needed.     tamsulosin 0.4 MG Caps  Dose: 0.4 mg  Commonly known as: FLOMAX  Take 0.4 mg by mouth daily.     torsemide 20 MG tablet  Dose: 20 mg  Commonly known as: DEMADEX  Take 1 tablet (20 mg total) by  mouth 2 (two) times daily x 2 days then reduce to 20 mg daily     Xarelto 15 MG Tabs  Dose: 15 mg  Generic drug: rivaroxaban  Take 15 mg by mouth daily with dinner         STOP taking these medications    furosemide 80 MG tablet  Commonly known as: LASIX     olmesartan 5 MG tablet  Commonly known as: BENICAR     POTASSIUM CHLORIDE PO  Replaced by: potassium chloride 10 MEQ tablet             Hospital Labs:     Recent Labs   Lab 06/24/19  1415 06/23/19  0711 06/22/19  0400  06/20/19  1338   Glucose 152* 93 86  More results in Results Review 85   BUN 26* 33* 34*  More results in Results Review 37*   Creatinine 1.49* 1.89* 1.86*  More results in Results Review 1.86*   Sodium 141 144 144  More results in Results Review 144   Potassium 3.5 3.6 3.4*  More results in Results Review 3.8   Chloride 103 105 107  More results in Results Review 108   CO2 31* 30 27  More results in Results Review 27   Magnesium 2.4  --   --   --   --    AST (SGOT)  --   --   --   --  18   ALT  --   --   --   --  11   More results in Results Review = values in this interval not displayed.                                  Time spent examining patient, discussing with patient/family regarding hospital course, chart review, reconciling medications and discharge planning: 30 minutes.      Signed by: Linden Dolin, PA ,

## 2019-06-28 ENCOUNTER — Telehealth: Payer: Self-pay

## 2019-06-28 NOTE — Telephone Encounter (Signed)
Reminder call for appt on 11/20 completed

## 2019-06-29 ENCOUNTER — Ambulatory Visit
Admission: RE | Admit: 2019-06-29 | Discharge: 2019-06-29 | Disposition: A | Discharge status: Home / Self Care | Payer: No Typology Code available for payment source | Source: Ambulatory Visit | Attending: Internal Medicine | Admitting: Internal Medicine

## 2019-06-29 ENCOUNTER — Inpatient Hospital Stay: Admission: RE | Admit: 2019-06-29 | Payer: Self-pay | Source: Ambulatory Visit

## 2019-06-29 ENCOUNTER — Encounter: Payer: Self-pay | Admitting: Family Nurse Practitioner

## 2019-06-29 ENCOUNTER — Ambulatory Visit
Admission: RE | Admit: 2019-06-29 | Discharge: 2019-06-29 | Disposition: A | Discharge status: Home / Self Care | Payer: No Typology Code available for payment source | Source: Ambulatory Visit | Attending: Medical | Admitting: Medical

## 2019-06-29 VITALS — BP 150/82 | HR 58 | Resp 18 | Wt 174.4 lb

## 2019-06-29 DIAGNOSIS — I4819 Other persistent atrial fibrillation: Secondary | ICD-10-CM

## 2019-06-29 DIAGNOSIS — N184 Chronic kidney disease, stage 4 (severe): Secondary | ICD-10-CM | POA: Insufficient documentation

## 2019-06-29 DIAGNOSIS — I480 Paroxysmal atrial fibrillation: Secondary | ICD-10-CM | POA: Insufficient documentation

## 2019-06-29 DIAGNOSIS — Z7984 Long term (current) use of oral hypoglycemic drugs: Secondary | ICD-10-CM | POA: Insufficient documentation

## 2019-06-29 DIAGNOSIS — E669 Obesity, unspecified: Secondary | ICD-10-CM | POA: Insufficient documentation

## 2019-06-29 DIAGNOSIS — Z7901 Long term (current) use of anticoagulants: Secondary | ICD-10-CM

## 2019-06-29 DIAGNOSIS — E785 Hyperlipidemia, unspecified: Secondary | ICD-10-CM | POA: Insufficient documentation

## 2019-06-29 DIAGNOSIS — I5032 Chronic diastolic (congestive) heart failure: Secondary | ICD-10-CM | POA: Insufficient documentation

## 2019-06-29 DIAGNOSIS — I50813 Acute on chronic right heart failure: Secondary | ICD-10-CM | POA: Insufficient documentation

## 2019-06-29 DIAGNOSIS — N400 Enlarged prostate without lower urinary tract symptoms: Secondary | ICD-10-CM | POA: Insufficient documentation

## 2019-06-29 DIAGNOSIS — Z79899 Other long term (current) drug therapy: Secondary | ICD-10-CM | POA: Insufficient documentation

## 2019-06-29 DIAGNOSIS — F17210 Nicotine dependence, cigarettes, uncomplicated: Secondary | ICD-10-CM | POA: Insufficient documentation

## 2019-06-29 DIAGNOSIS — Z8673 Personal history of transient ischemic attack (TIA), and cerebral infarction without residual deficits: Secondary | ICD-10-CM | POA: Insufficient documentation

## 2019-06-29 DIAGNOSIS — Z8249 Family history of ischemic heart disease and other diseases of the circulatory system: Secondary | ICD-10-CM | POA: Insufficient documentation

## 2019-06-29 DIAGNOSIS — K760 Fatty (change of) liver, not elsewhere classified: Secondary | ICD-10-CM | POA: Insufficient documentation

## 2019-06-29 DIAGNOSIS — J449 Chronic obstructive pulmonary disease, unspecified: Secondary | ICD-10-CM | POA: Insufficient documentation

## 2019-06-29 DIAGNOSIS — I5082 Biventricular heart failure: Secondary | ICD-10-CM | POA: Insufficient documentation

## 2019-06-29 DIAGNOSIS — F319 Bipolar disorder, unspecified: Secondary | ICD-10-CM | POA: Insufficient documentation

## 2019-06-29 DIAGNOSIS — M109 Gout, unspecified: Secondary | ICD-10-CM | POA: Insufficient documentation

## 2019-06-29 DIAGNOSIS — I272 Pulmonary hypertension, unspecified: Secondary | ICD-10-CM | POA: Insufficient documentation

## 2019-06-29 DIAGNOSIS — E1022 Type 1 diabetes mellitus with diabetic chronic kidney disease: Secondary | ICD-10-CM | POA: Insufficient documentation

## 2019-06-29 DIAGNOSIS — I130 Hypertensive heart and chronic kidney disease with heart failure and stage 1 through stage 4 chronic kidney disease, or unspecified chronic kidney disease: Secondary | ICD-10-CM | POA: Insufficient documentation

## 2019-06-29 DIAGNOSIS — N189 Chronic kidney disease, unspecified: Secondary | ICD-10-CM

## 2019-06-29 LAB — BASIC METABOLIC PANEL
Anion Gap: 11.8 mMol/L (ref 7.0–18.0)
BUN / Creatinine Ratio: 24.4 Ratio (ref 10.0–30.0)
BUN: 51 mg/dL — ABNORMAL HIGH (ref 7–22)
CO2: 29 mMol/L (ref 20–30)
Calcium: 9.4 mg/dL (ref 8.5–10.5)
Chloride: 107 mMol/L (ref 98–110)
Creatinine: 2.09 mg/dL — ABNORMAL HIGH (ref 0.80–1.30)
EGFR: 31 mL/min/{1.73_m2} — ABNORMAL LOW (ref 60–150)
Glucose: 107 mg/dL — ABNORMAL HIGH (ref 71–99)
Osmolality Calculated: 299 mOsm/kg (ref 275–300)
Potassium: 4.8 mMol/L (ref 3.5–5.3)
Sodium: 143 mMol/L (ref 136–147)

## 2019-06-29 MED ORDER — ISOSORBIDE MONONITRATE ER 30 MG PO TB24
30.00 mg | ORAL_TABLET | Freq: Every day | ORAL | 11 refills | Status: DC
Start: 2019-06-29 — End: 2019-07-12

## 2019-06-29 MED ORDER — BUMETANIDE 0.25 MG/ML IJ SOLN
2.50 mg | Freq: Once | INTRAMUSCULAR | Status: AC
Start: 2019-06-29 — End: 2019-06-29
  Administered 2019-06-29: 2.5 mg via INTRAVENOUS
  Filled 2019-06-29: qty 10

## 2019-06-29 MED ORDER — RIVAROXABAN 15 MG PO TABS
15.00 mg | ORAL_TABLET | Freq: Every day | ORAL | 11 refills | Status: AC
Start: 2019-06-29 — End: ?

## 2019-06-29 NOTE — Progress Notes (Signed)
[facility]      Advanced Heart Failure Initial Visit    [name]   Date of Birth: [birth year]    Provider: Belva Agee, NP     Patient Care Team:  [name], MD as PCP - General (Family Medicine)  [name], DO as Consulting Physician (Cardiology)  [name], MD as Consulting Physician (Thoracic and Cardiac Surgery)    Chief Complaint:      New hospital follow up    HPI:  I had the pleasure of seeing [name] today at [facility] for an advanced heart failure new appointment for hospital follow up.      Was seen and evaluated by Dr. [name] during hospitalization from 11/11 to 11/16.  He was noted to have acute right heart failure.  He had a paracentesis n 06/22/2019.  He was diuresed during his hospitalization.  Benicar was held with the objective to optimize it as an outpatient.  When discharged she was placed on minoxidil.    He is checking his blood pressures at home is having bp ranging from 150/70 to 110/70. HR 55-60s.  He notes he continues with significant lower extremity edema.  He notes that his abdomen was flatter initially after his discharge and has started to increase.  He does have a history of paroxysmal atrial fibrillation and a prior ablation.  He is supposed to be on Xarelto 15 mg daily but a new prescription was not sent in.  He is cutting up his 20 mg tablets in an effort to make a 15 mg tablet.  He would like a refill of this medication today.  He has never had a sleep study.  He had an echocardiogram recently that showed an RVSP of 93. EF 65-70, mod tr, mod AS.  He has a history of diabetes and is currently still on Metformin.  He is scheduled to see his primary care provider on Tuesday.    [name] is a 68 y.o. male with HFpEF, NYHA Class 3, ACC/AHA.  Also has a history of paroxysmal atrial fibrillation status post ablation, diabetes, chronic kidney  disease stage III-IV, hypertension hyperlipidemia bipolar disorder. From a cardiovascular standpoint, [name] states has not done well.      He denies any fever, chills, night sweats, lightheadedness, dizziness, presyncope or syncope, shortness of breath at rest, orthopnea, PND, increasing fatigue or decreasing exercise tolerance. He has had no chest pain or pressure.  His appetite has been stable and he denies any abdominal bloating or distention, early satiety, anorexia, nausea, vomiting or changes in bowel or bladder habits.  He has had hospital admissions or emergency room visits.    Diago reports that he has not been exercising.  He does check blood pressures at home.   He has been checking morning weights; his weight has been stable and slightly declined since discharge.  Oniel has been following a fluid restriction of 2 L/day and has restricting dietary sodium content to no more than 2 gm/day.  He reports that he has been compliant with his medications.      Weight:   Wt Readings from Last 3 Encounters:   06/29/19 79.1 kg (174 lb 6.4 oz)   06/24/19 77.8 kg (171 lb 8.3 oz)   06/20/19 80.3 kg (177 lb)       Review of Systems:    Comprehensive 12 point review of system is negative unless described  above in HPI          Medical Problems:  Patient Active Problem List    Diagnosis Date Noted   . Diastolic CHF, acute 81/82/9937   . Nonrheumatic aortic valve stenosis 02/26/2019   . Follow-up exam 01/26/2017   . 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 Chronic anticoagulation 11/25/2016   . Stroke 11/25/2016   . CKD (chronic kidney disease) 11/25/2016   . Former smoker, stopped smoking many years ago 11/25/2016   . Type 2 diabetes mellitus 11/25/2016   . HTN (hypertension) 11/25/2016   . HLD (hyperlipidemia) 11/25/2016   . Diabetic neuropathy 11/25/2016   . COPD (chronic obstructive pulmonary disease) 11/25/2016   . Bipolar disorder 11/25/2016   . BPH (benign prostatic hyperplasia) 11/25/2016   . Atrial  fibrillation 11/22/2016   . Chronic atrial fibrillation 10/08/2016   . Stage 4 chronic kidney disease 10/08/2016   . Persistent atrial fibrillation 10/07/2016   . Fatty liver 10/07/2016     Past Medical History:  1. Atrial fibrillation  A. Cardioversion 10/22/11.   B. Echo 10/22/11- moderate-severe concentric LVH. Biatrial enlargement. Mild AS, dilated inferior vena cava, small pericardial effusion. Peak velocity across the AV at 2.2 meters/s.  C. Cardiac abltation 01/13/15.   D. MRI Cardiac 03/16/16- LVEF 68.4%. Moderate septal wall hypertrophy (1.6cm) and mild posterior wall hypertrophy (1.3cm). Focal area of mid-myocardial wall delayed enhancement in basal anteroseptal wall and RV insertion point consistent with hypertensive heart disease. RVEF 41.7%. Severe LA enlargement and moderate RA enlargement. 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 Mild AS with peak velocity 2.32m/s. Mild central AR. Mild MR and TR. Small to moderate pericardial effusion. Mildly dilated ascending aorta. Aortic arch and descending thoracic aorta are ectatic. Trivial right pleural effusion. Dilated central pulm artery at 4.3cm.   E. s/p TEE and [place] cardioversion 06/04/16  F. Convergent AF Ablation 11/22/16 - Procedure was complicated with pericardial effusion/ tamponade requiring pericardiocentesis. He developed left pleural effusion with thoracentesis.   G. Echo 12/03/16- No pericardial effusion, aortic valve is severely calcified with severely restricted systolic opening, mild regurgitation, LV is normal in size with moderate to severe concentric hypertrophy, systolic function is preserved with an EF 16-96%, diastolic dysfunction that couldn't be graded.   H. Echo 12/08/17- EF 60-65%. Moderate concentric LV hypertrophy. LA and RA severely dilated. Mild MR and moderate TR. Moderate valvular aortic stenosis. Mild to moderate AR and pulmonic valvular regurgitation. Mildly dilated ascending aorta  3.8-4.2 cm.   I. Echo 01/29/19- Moderate to severe concentric LVH, EF 78-93%, grade 2 diastolic dysfunction of the LV (with evidence of increased left atrial pressure), RV size is moderately increased, RV hypertrophy, RV systolic function is mild to moderately decreased, proximal ascending aorta is mildly dilated at 4.2cm. moderate aortic stenosis, mild to moderate insufficiency, mitral valve regurgitation is mild, tricuspid regurgitation is moderate, estimated pulm arterial systolic pressure is severely elevated, 99mmHg, pulm valve regurgitation is mild to moderate.   J. Echo 06/21/2019  ef 65-70%. right ventricular size is mildy increased. The right ventricular systolic function is mildly decreased. Moderate aortic stenosis with peak aortic valve velocity 3.5 m/s and mean pressure gradient of 23 mmHg. Mild to moderate aortic regurgitation. Mild mitral regurgitation.  Moderate tricuspid regurgitation.  Estimated RVSP 93 mmHg.     2. Aorta status  A. CTA Chest 07/11/18- no significant change and type B aortic dissection with mostly thrombosed false lumen with stable fusiform  aneurysmal dilatation. (Again there is small area of persistent perfusion of the aneurysm sac which appears to have diminished since prior exam and may be related to partial thrombosis versus technique). At subsequent follow-up, would encourage delayed evaluation in addition to arterial phase evaluation. Maximal aneurysmal dilatation involving the descending segment measures 45 x 39 mm.     3. Hypertension.  4. Hyperlipidemia.   5. Type I Diabetes Mellitus.  6. Bipolar disorder.  7. Fatty liver.  8. Renal insufficiency.  A. Normal renal ultrasound October 23, 2011.  9. Basilar Airway disease  Past Medical History:   Diagnosis Date   . Arrhythmia    . Arthritis    . Atrial fibrillation    . Atrial flutter    . Bipolar affective    . BPH (benign prostatic hyperplasia)    . Complication of anesthesia     resp. asessment   . Diabetes mellitus    .  Diabetic neuropathy    . Gout    . Heart murmur    . HOH (hard of hearing)    . Hyperlipidemia    . Hypertension    . Paroxysmal atrial fibrillation    . Pulmonary hypertension    . Renal insufficiency    . Type 2 diabetes mellitus, controlled    . Wears glasses      Past Surgical History:   has a past surgical history that includes Appendectomy; Hernia repair; TONSILLECTOMY, ADENOIDECTOMY; CARDIAC ABLATION; Cardioversion; TEE; Cardiac catheterization; Tonsillectomy; Pericardiocentesis (Left, 11/25/2016); FLUORO-NO CHARGE (N/A, 11/22/2016); Paracentesis (2011); and Paracentesis (06/22/2019).  Family History:  family history includes Diabetes in his brother, mother, and sister; Heart disease in his brother and mother; Hyperlipidemia in his brother, brother, and sister; Hypertension in his brother, brother, daughter, mother, and sister; Kidney disease in his brother.  Social History:   reports that he quit smoking about 25 years ago. His smoking use included cigarettes. He has a 12.00 pack-year smoking history. He has never used smokeless tobacco. He reports that he does not drink alcohol or use drugs.     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  Medications:    Current Outpatient Medications:   .  acetaminophen (TYLENOL) 500 MG tablet, Take 500 mg by mouth every 6 (six) hours as needed for Pain., Disp: , Rfl:   .  allopurinol (ZYLOPRIM) 300 MG tablet, Take 300 mg by mouth daily., Disp: , Rfl:   .  amiodarone (PACERONE) 200 MG tablet, Take 1 tablet (200 mg total) by mouth daily, Disp: 90 tablet, Rfl: 1  .  atenolol (TENORMIN) 100 MG tablet, Take 1 tablet (100 mg total) by mouth daily, Disp: 90 tablet, Rfl: 3  .  divalproex EC/DR tablet (DEPAKOTE EC/DR) 250 MG EC tablet, Take 250 mg by mouth 2 (two) times daily, Disp: , Rfl:   .  gabapentin (NEURONTIN) 100 MG  capsule, Take 100 mg by mouth 2 (two) times daily., Disp: , Rfl:   .  glimepiride (AMARYL) 1 MG tablet, Take 0.5 mg by mouth every morning before breakfast.  , Disp: , Rfl:   .  Loperamide HCl (IMODIUM PO), Take by mouth daily as needed., Disp: , Rfl:   .  metFORMIN (GLUCOPHAGE) 500 MG tablet, Take 1,000 mg by mouth 2 (two) times daily with  meals  , Disp: , Rfl:   .  minoxidil (LONITEN) 10 MG tablet, Take 10 mg by mouth daily May take up to 30mg   Qd if BP is elevated , Disp: , Rfl:   .  Multiple Vitamin (MULTIVITAMIN) capsule, Take 1 capsule by mouth daily., Disp: , Rfl:   .  potassium chloride (K-DUR) 10 MEQ tablet, Take 1 tablet (10 mEq total) by mouth daily, Disp: 90 tablet, Rfl: 3  .  pravastatin (PRAVACHOL) 40 MG tablet, Take 40 mg by mouth every evening., Disp: , Rfl:   .  rivaroxaban (Xarelto) 15 MG Tab, Take 15 mg by mouth daily with dinner  , Disp: , Rfl:   .  SENNA CO, by Combination route daily as needed., Disp: , Rfl:   .  tamsulosin (FLOMAX) 0.4 MG Cap, Take 0.4 mg by mouth daily., Disp: , Rfl:   .  torsemide (DEMADEX) 20 MG tablet, Take 20 mg by mouth daily, Disp: , Rfl:     Vitals:    Visit Vitals  BP 150/82 (BP Site: Left arm)   Pulse (!) 58   Resp 18   Wt 79.1 kg (174 lb 6.4 oz)   SpO2 100%   BMI 27.31 kg/m       Physical Exam:   General: well-developed male, alert, NAD  HEENT:  Anicteric sclera, supple  Lungs: CTA B/L, no rhonchi, wheezes or crackles with normal rate and effort  Cardiovascular: s1s2, normal rate, regular rhythm, ii/vi sem Abdominal: distended non tender.   Extremities: warm to touch, 2+ edema.  Neuromuscular exam: grossly non-focal, though not formally tested.    Laboratory Data:  Lab Results   Component Value Date/Time    WBC 13.3 (H) 12/09/2016 07:10 AM    RBC 3.14 (L) 12/09/2016 07:10 AM    HGB 8.9 (L) 12/09/2016 07:10 AM    HCT 29.2 (L) 12/09/2016 07:10 AM    PLT 621 (H) 12/09/2016 07:10 AM    TSH 4.57 (H) 06/14/2019 11:56 AM     Lab Results   Component Value Date/Time    NA  143 06/29/2019 09:12 AM    K 4.8 06/29/2019 09:12 AM    CL 107 06/29/2019 09:12 AM    CO2 29 06/29/2019 09:12 AM    GLU 107 (H) 06/29/2019 09:12 AM    BUN 51 (H) 06/29/2019 09:12 AM    CREAT 2.09 (H) 06/29/2019 09:12 AM    PROT 7.2 06/20/2019 01:38 PM    ALKPHOS 140 06/20/2019 01:38 PM    AST 18 06/20/2019 01:38 PM    ALT 11 06/20/2019 01:38 PM     Lab Results   Component Value Date/Time    CHOL 70 (L) 11/26/2016 03:02 AM    TRIG 78 11/26/2016 03:02 AM    HDL 21 (L) 11/26/2016 03:02 AM    LDL 33 11/26/2016 03:02 AM     Lab Results   Component Value Date/Time    HGBA1CPERCNT 6.5 11/26/2016 03:02 AM     Lab Results   Component Value Date/Time    BNP 1,041.5 (H) 11/15/2016 11:10 AM     06/29/2019  Sodium 143  Potassium 4.8  CO2 29  Glucose 107  BUN 51  Creatinine 2.09    Assessment and Plan:  1. Chronic diastolic heart failure     2. Persistent atrial fibrillation     3. Chronic anticoagulation     4. Chronic kidney disease, unspecified CKD stage  My impression is that, Mr. Bufford has HFpEF.  Chronic kidney disease is slightly increased but near his baseline entering the beginning of the hospitalization.  I have asked him to discuss with Dr. [name] holding his metformin.  I have stopped his minoxidil and potassium.  I have started Imdur 30 mg daily.  Bicarb is within normal limits.  provided IV Bumex 2.5 mg in clinic today.  He does appear volume overloaded on exam.  This could be an element of cardiorenal syndrome.  Start Imdur 30 mg daily.  I have referred him for a sleep study due to his history of paroxysmal atrial fibrillation and right heart failure.  I have refilled the Xarelto at 15 mg daily.    Return to clinic in 2 weeks.  Patient received face-to-face education on heart failure signs and symptoms, dietary recommendations and fluid restriction.    Counseling:  -I have encouraged pt to continue to follow a <2gm/day Na restriction and limit fluid intake to < 2L/day.  -I have requested pt perform  daily wts and contact our office for increase of >2-3lbs in 1 day or 5lbs in 1 week.   -I have encouraged pt to engage in aerobic activity as tolerated and avoid heavy weight lifting.   -Smoking/Alcohol: I have advised pt to abstain from any tobacco use and excessive alcohol intake.    -Weight: Obesity is a significant risk for both short and long-term morbidity and mortality for AHF patients.  I have stressed the importance of weight control and dietary management.     Our office is available to answer any questions you or the patient may have. We appreciate the opportunity to participate in the care of your patients.      Electronically signed by:    Norval Gable. Abigail Butts, FNP-C  Clinical Coordinator for Advance Heart Failure and [facility]  [address]  [place], [place] [address]  [phone]   [phone] - portable phone  [phone] - fax         Note: This chart was generated by the [facility] EMR system/speech recognition and may contain inherit omission or errors not intended by the user.  Grammatical errors, random word insertions, deletions, pronoun errors and incomplete sentences are occasionally consequences of this technology due to software limitations.  Not all errors are caught or corrected.  If there are questions or concerns about the content of this note or information contained in the body of this dictation they should be addressed directly with the author for clarification.

## 2019-06-29 NOTE — Patient Instructions (Addendum)
Heart failure causes many symptoms.   Some of them are more serious than others.   It is important to recognize when you should call 911 for emergency help and when you should call your doctor or nurse for urgent attention.    The following lists can help you decide what to do when you experience certain symptoms. You may want to print this page and keep it in a convenient spot.    Emergency Symptoms of Heart Failure  Call 911 for emergency help, if you have:    -Chest discomfort or pain that lasts more than 15 minutes that is not relieved with rest or nitroglycerin.  -Severe, persistent shortness of breath.  -Fainted or passed out.    Urgent Symptoms of Heart Failure  Call your doctor immediately, if you have any of the following symptoms:    -Increasing shortness of breath or a new shortness of breath while resting.  -Trouble sleeping due to difficulty breathing. For example, waking up suddenly at night due to difficulty breathing.  -A need to sleep sitting up or on more pillows than usual.  -Fast or irregular heart beats, palpitations, or a "racing heart" that persists and makes you feel dizzy or lightheaded.    Or if you:  -Cough up frothy or pink sputum.  -Feel like you may faint or pass out.    To manage your heart failure, please follow the following recommendations:    Fluid restriction:  You should follow a daily fluid restriction not to exceed 2 Liters daily (equivalent to 64 fluid ounces). This includes all things liquid at room temperature such as: all beverages, soup, ice cream, popsicles, jello and ice (you will need to guess how much the ice would be when melted and add toward your daily total). Keep track of your fluid intake throughout the day and measure everything.     Daily weights:  Weigh yourself every day when you wake up, right after using the bathroom. Keep a record of your daily weights and call our office if you gain more than 2-3 pounds in 1 day or 5 pounds in less than a week.     Low  sodium diet:  Sodium can lead to fluid retention and patients with heart failure should follow the low sodium diet, do not exceed 2000 mg sodium daily. Do not cook with salt or add it at the table. This includes table salt, sea salt, Kosher salt, garlic salt, seasoned salt or other seasoning's with salt (read the nutrition label). Avoid eating out at restaurants and high sodium foods such as processed meats, pre-packaged meals, pickles, soy sauce, chips, canned soups and bacon. Ask your healthcare provider for more information.    Salt Substitutes   When you eat sodium, your body dilutes it with water. This causes water retention, high blood pressure and a tough job for your heart! Limiting salt will give your heart a well-deserved break. but you don't have to sacrifice taste! Salt substitutes enhance the flavor of food without adding sodium. Replace your salt shaker with herbs & spices to support the health of your heart.    Potassium-Salt versus Sodium Salt-Substitutes  Some salt substitutes use Potassium Chloride instead of Sodium Chloride. For some, extra potassium is a bad idea because too much may cause heart rhythm problems. Never use a salt substitute with potassium chloride without first checking with your health care provider.     Contain Potassium:  - Nu-Salt by Sweet & Low   -  NoSalt  - Morton Salt Substitute  - AlsoSalt    No Potassium or Sodium:  - Mrs. Dash (in 10+ flavors)  - McCormick's No Added Salt (in 4 flavors)  - Emeril's No Salt (only 1 flavor: Essence [place])  - IT consultant by [employer] (in 7 flavors)  Herbs & Spices  - lemon or lime juice  - flavored vinegar  - thyme   - basil   - oregano  - sage  - nutmeg   - black pepper  - cayenne pepper  - paprika   - ginger   - cumin  - onion powder  - mace   - celery seed  - garlic powder  - fresh garlic  - curry powder  - mustard seed  - parsley flakes  - cinnamon  - bay leaf     Other Salt-Free Ideas  Season or marinate  meat, poultry, and fish ahead of time with onion, garlic, and your favorite herbs before cooking to bring out the flavor.    Activity:  We encourage you to engage in regular physical activity, such as daily walking. Start with short distances and walk at a comfortable pace. You should be able to "walk and talk". Gradually increase the distance and time that you walk. Exercise will help you feel better and can relieve the symptom of "heavy, tired legs". Stop exercise if you have chest pain, dizziness, or get so short of breath that you can not talk.   You should not lift/push/or pull heavy objects (anything heavy enough to make you strain).     Medications:  Take all your medications as prescribed by your health care provider and bring all your medication bottles to every appointment. It is important that you know what you are taking, when, and for what reason.    Medications you should not take:  Nonsteroidal anti-inflammatory drugs (NSAIDS): such as ibuprofen, Advil, Motrin, naprosyn, Naproxen, may cause fluid retention. You should not take these medications.    Call your healthcare provider if you are not feeling well or have new problems. We will call you back within 24 hours (Monday-Friday). In case of an emergency, call 911 or go to the nearest emergency department.      Thank you for choosing the [place] [facility] with [facility] for your care. It is important to keep all your medical appointments, even if you feel well. To reschedule or cancel an appointment with our clinic, call [phone]. If you need to leave a message on the nurse line for the heart failure clinic, please call [phone].      Stop minoxidil    Start imdur 30 mg 1 tablet daily    I have referred you to meet with a sleep specialists.     Hold potassium     Please discuss with Dr. [name] if he wants to hold your metformin    Repeat lab work in 1 week.    If you notice your weight increasing 2  lbs overnight or 5 lbs in 1 week, please call our clinic at [phone].

## 2019-07-04 ENCOUNTER — Ambulatory Visit
Admission: RE | Admit: 2019-07-04 | Discharge: 2019-07-04 | Disposition: A | Discharge status: Home / Self Care | Payer: No Typology Code available for payment source | Source: Ambulatory Visit | Attending: Family Nurse Practitioner | Admitting: Family Nurse Practitioner

## 2019-07-04 ENCOUNTER — Telehealth: Payer: Self-pay | Admitting: Family Nurse Practitioner

## 2019-07-04 ENCOUNTER — Inpatient Hospital Stay: Admission: RE | Admit: 2019-07-04 | Payer: Self-pay | Source: Ambulatory Visit

## 2019-07-04 DIAGNOSIS — N189 Chronic kidney disease, unspecified: Secondary | ICD-10-CM | POA: Insufficient documentation

## 2019-07-04 DIAGNOSIS — I5032 Chronic diastolic (congestive) heart failure: Secondary | ICD-10-CM | POA: Insufficient documentation

## 2019-07-04 DIAGNOSIS — N184 Chronic kidney disease, stage 4 (severe): Secondary | ICD-10-CM | POA: Insufficient documentation

## 2019-07-04 LAB — BASIC METABOLIC PANEL
Anion Gap: 14.6 mMol/L (ref 7.0–18.0)
BUN / Creatinine Ratio: 24.1 Ratio (ref 10.0–30.0)
BUN: 52 mg/dL — ABNORMAL HIGH (ref 7–22)
CO2: 26 mMol/L (ref 20–30)
Calcium: 9.1 mg/dL (ref 8.5–10.5)
Chloride: 107 mMol/L (ref 98–110)
Creatinine: 2.16 mg/dL — ABNORMAL HIGH (ref 0.80–1.30)
EGFR: 30 mL/min/{1.73_m2} — ABNORMAL LOW (ref 60–150)
Glucose: 129 mg/dL — ABNORMAL HIGH (ref 71–99)
Osmolality Calculated: 301 mOsm/kg — ABNORMAL HIGH (ref 275–300)
Potassium: 4.6 mMol/L (ref 3.5–5.3)
Sodium: 143 mMol/L (ref 136–147)

## 2019-07-04 MED ORDER — EMPAGLIFLOZIN 10 MG PO TABS
10.0000 mg | ORAL_TABLET | Freq: Every morning | ORAL | Status: AC
Start: 2019-07-04 — End: ?

## 2019-07-04 NOTE — Telephone Encounter (Signed)
Reviewed lab work with patient.  He states that torsemide is working well at home.  He is on 20 mg once a day.  He did stop his minoxidil and start Imdur 30 mg daily.  He did not stop his potassium.  He recently was seen by his primary care provider and his Metformin and glimepiride were stopped yesterday.  He was started on Jardiance.  Creatinine on lab work is relatively stable from last results.  He is scheduled for follow-up appointment on December 3.  We will continue to monitor and obtain repeat lab work at next office visit.  I have not adjusted his diuretics as his abdomen is less distended and he is on a relatively low dose of diuretics.  Now that his metformin has been held I hope that his kidney function improves.  His primary care provider also started him on Jardiance.  His medication list has been updated.

## 2019-07-12 ENCOUNTER — Encounter: Payer: Self-pay | Admitting: Family Nurse Practitioner

## 2019-07-12 ENCOUNTER — Ambulatory Visit
Admission: RE | Admit: 2019-07-12 | Discharge: 2019-07-12 | Disposition: A | Discharge status: Home / Self Care | Payer: No Typology Code available for payment source | Source: Ambulatory Visit | Attending: Family Nurse Practitioner | Admitting: Family Nurse Practitioner

## 2019-07-12 VITALS — BP 174/94 | HR 51 | Resp 15 | Wt 166.0 lb

## 2019-07-12 DIAGNOSIS — I480 Paroxysmal atrial fibrillation: Secondary | ICD-10-CM | POA: Insufficient documentation

## 2019-07-12 DIAGNOSIS — I10 Essential (primary) hypertension: Secondary | ICD-10-CM

## 2019-07-12 DIAGNOSIS — Z7984 Long term (current) use of oral hypoglycemic drugs: Secondary | ICD-10-CM | POA: Insufficient documentation

## 2019-07-12 DIAGNOSIS — I130 Hypertensive heart and chronic kidney disease with heart failure and stage 1 through stage 4 chronic kidney disease, or unspecified chronic kidney disease: Secondary | ICD-10-CM | POA: Insufficient documentation

## 2019-07-12 DIAGNOSIS — E1022 Type 1 diabetes mellitus with diabetic chronic kidney disease: Secondary | ICD-10-CM | POA: Insufficient documentation

## 2019-07-12 DIAGNOSIS — F17210 Nicotine dependence, cigarettes, uncomplicated: Secondary | ICD-10-CM | POA: Insufficient documentation

## 2019-07-12 DIAGNOSIS — Z7901 Long term (current) use of anticoagulants: Secondary | ICD-10-CM

## 2019-07-12 DIAGNOSIS — K760 Fatty (change of) liver, not elsewhere classified: Secondary | ICD-10-CM | POA: Insufficient documentation

## 2019-07-12 DIAGNOSIS — Z8249 Family history of ischemic heart disease and other diseases of the circulatory system: Secondary | ICD-10-CM | POA: Insufficient documentation

## 2019-07-12 DIAGNOSIS — F319 Bipolar disorder, unspecified: Secondary | ICD-10-CM | POA: Insufficient documentation

## 2019-07-12 DIAGNOSIS — E669 Obesity, unspecified: Secondary | ICD-10-CM | POA: Insufficient documentation

## 2019-07-12 DIAGNOSIS — I5082 Biventricular heart failure: Secondary | ICD-10-CM | POA: Insufficient documentation

## 2019-07-12 DIAGNOSIS — N189 Chronic kidney disease, unspecified: Secondary | ICD-10-CM

## 2019-07-12 DIAGNOSIS — Z8673 Personal history of transient ischemic attack (TIA), and cerebral infarction without residual deficits: Secondary | ICD-10-CM | POA: Insufficient documentation

## 2019-07-12 DIAGNOSIS — M109 Gout, unspecified: Secondary | ICD-10-CM | POA: Insufficient documentation

## 2019-07-12 DIAGNOSIS — I5032 Chronic diastolic (congestive) heart failure: Secondary | ICD-10-CM

## 2019-07-12 DIAGNOSIS — N400 Enlarged prostate without lower urinary tract symptoms: Secondary | ICD-10-CM | POA: Insufficient documentation

## 2019-07-12 DIAGNOSIS — Z79899 Other long term (current) drug therapy: Secondary | ICD-10-CM | POA: Insufficient documentation

## 2019-07-12 DIAGNOSIS — E785 Hyperlipidemia, unspecified: Secondary | ICD-10-CM | POA: Insufficient documentation

## 2019-07-12 DIAGNOSIS — I4819 Other persistent atrial fibrillation: Secondary | ICD-10-CM

## 2019-07-12 DIAGNOSIS — J449 Chronic obstructive pulmonary disease, unspecified: Secondary | ICD-10-CM | POA: Insufficient documentation

## 2019-07-12 DIAGNOSIS — N184 Chronic kidney disease, stage 4 (severe): Secondary | ICD-10-CM | POA: Insufficient documentation

## 2019-07-12 DIAGNOSIS — I272 Pulmonary hypertension, unspecified: Secondary | ICD-10-CM | POA: Insufficient documentation

## 2019-07-12 LAB — I-STAT CHEM 8 CARTRIDGE
Anion Gap I-Stat: 14 (ref 7.0–16.0)
BUN I-Stat: 37 mg/dL — ABNORMAL HIGH (ref 7–22)
Calcium Ionized I-Stat: 4.7 mg/dL (ref 4.35–5.10)
Chloride I-Stat: 101 mMol/L (ref 98–110)
Creatinine I-Stat: 1.7 mg/dL — ABNORMAL HIGH (ref 0.80–1.30)
EGFR: 40 mL/min/{1.73_m2} — ABNORMAL LOW (ref 60–150)
Glucose I-Stat: 116 mg/dL — ABNORMAL HIGH (ref 71–99)
Hematocrit I-Stat: 35 % — ABNORMAL LOW (ref 39.0–52.5)
Hemoglobin I-Stat: 11.9 gm/dL — ABNORMAL LOW (ref 13.0–17.5)
Potassium I-Stat: 4 mMol/L (ref 3.5–5.3)
Sodium I-Stat: 139 mMol/L (ref 136–147)
TCO2 I-Stat: 30 mMol/L — ABNORMAL HIGH (ref 24–29)

## 2019-07-12 MED ORDER — ISOSORBIDE MONONITRATE ER 60 MG PO TB24
60.00 mg | ORAL_TABLET | Freq: Every day | ORAL | 11 refills | Status: AC
Start: 2019-07-12 — End: ?

## 2019-07-12 NOTE — Patient Instructions (Signed)
Heart failure causes many symptoms.   Some of them are more serious than others.   It is important to recognize when you should call 911 for emergency help and when you should call your doctor or nurse for urgent attention.    The following lists can help you decide what to do when you experience certain symptoms. You may want to print this page and keep it in a convenient spot.    Emergency Symptoms of Heart Failure  Call 911 for emergency help, if you have:    -Chest discomfort or pain that lasts more than 15 minutes that is not relieved with rest or nitroglycerin.  -Severe, persistent shortness of breath.  -Fainted or passed out.    Urgent Symptoms of Heart Failure  Call your doctor immediately, if you have any of the following symptoms:    -Increasing shortness of breath or a new shortness of breath while resting.  -Trouble sleeping due to difficulty breathing. For example, waking up suddenly at night due to difficulty breathing.  -A need to sleep sitting up or on more pillows than usual.  -Fast or irregular heart beats, palpitations, or a “racing heart” that persists and makes you feel dizzy or lightheaded.    Or if you:  -Cough up frothy or pink sputum.  -Feel like you may faint or pass out.    To manage your heart failure, please follow the following recommendations:    Fluid restriction:  You should follow a daily fluid restriction not to exceed 2 Liters daily (equivalent to 64 fluid ounces). This includes all things liquid at room temperature such as: all beverages, soup, ice cream, popsicles, jello and ice (you will need to guess how much the ice would be when melted and add toward your daily total). Keep track of your fluid intake throughout the day and measure everything.     Daily weights:  Weigh yourself every day when you wake up, right after using the bathroom. Keep a record of your daily weights and call our office if you gain more than 2-3 pounds in 1 day or 5 pounds in less than a week.     Low  sodium diet:  Sodium can lead to fluid retention and patients with heart failure should follow the low sodium diet, do not exceed 2000 mg sodium daily. Do not cook with salt or add it at the table. This includes table salt, sea salt, Kosher salt, garlic salt, seasoned salt or other seasoning's with salt (read the nutrition label). Avoid eating out at restaurants and high sodium foods such as processed meats, pre-packaged meals, pickles, soy sauce, chips, canned soups and bacon. Ask your healthcare provider for more information.    Activity:  We encourage you to engage in regular physical activity, such as daily walking. Start with short distances and walk at a comfortable pace. You should be able to "walk and talk". Gradually increase the distance and time that you walk. Exercise will help you feel better and can relieve the symptom of "heavy, tired legs". Stop exercise if you have chest pain, dizziness, or get so short of breath that you can not talk.   You should not lift/push/or pull heavy objects (anything heavy enough to make you strain).     Medications:  Take all your medications as prescribed by your health care provider and bring all your medication bottles to every appointment. It is important that you know what you are taking, when, and for what reason.      Medications you should not take:  Nonsteroidal anti-inflammatory drugs (NSAIDS): such as ibuprofen, Advil, Motrin, naprosyn, Naproxen, may cause fluid retention. You should not take these medications.    Call your healthcare provider if you are not feeling well or have new problems. We will call you back within 24 hours (Monday-Friday). In case of an emergency, call 911 or go to the nearest emergency department.      Thank you for choosing the [place] [facility] with [facility] for your care. It is important to keep all your medical appointments, even if you feel well. To reschedule or cancel an appointment  with our clinic, call [phone]. If you need to leave a message on the nurse line for the heart failure clinic, please call [phone].      Increase imdur 60 mg every morning.  I have sent a new prescription to the pharmacy.     If you have not followed up with Dr. [name] and had lab work, please complete it when you see [facility]. I gave you a copy of lab work complete the next time you do lab work for primary care.

## 2019-07-12 NOTE — Progress Notes (Signed)
[place]      Advanced Heart Failure Established Visit    [name]   Date of Birth: [birth year]    Provider: Belva Agee, NP     Patient Care Team:  [name], MD as PCP - General (Family Medicine)  [name], DO as Consulting Physician (Cardiology)  [name], MD as Consulting Physician (Thoracic and Cardiac Surgery)    Chief Complaint:      Follow up     HPI:  I had the pleasure of seeing [name] today at [facility] for an advanced heart failure new appointment for hospital follow up.      Continues on jardiance and torsemide 20 mg daily.  Notes his weights at home have declined and his lower extremity edema has improved.  He feels better today than he has.  He feels that after Sunday evening he had a increased urination.  He is completing samples of Jardiance and is scheduled to follow-up with his primary care provider to review lab work.  He is checking his blood pressure at home and is reporting blood pressure readings with a systolic blood pressure of mid 140s to mid 130s.  Denies any episodes of dizziness.  Does feel that his muscular skeletal pain is causing his blood pressure to increase.    [name] is a 68 y.o. male with HFpEF, NYHA Class 3, ACC/AHA.  Also has a history of paroxysmal atrial fibrillation status post ablation, diabetes, chronic kidney disease stage III-IV, hypertension hyperlipidemia bipolar disorder. From a cardiovascular standpoint, [name] states has not done well.      He denies any fever, chills, night sweats, lightheadedness, dizziness, presyncope or syncope, shortness of breath at rest, orthopnea, PND, increasing fatigue or decreasing exercise tolerance. He has had no chest pain or pressure.  His appetite has been stable and he denies any abdominal bloating or distention, early satiety, anorexia, nausea, vomiting or changes in bowel or  bladder habits.  He has had hospital admissions or emergency room visits.    [name] reports that he has not been exercising.  He does check blood pressures at home.   He has been checking morning weights; his weight has been stable and slightly declined since discharge.  [name] has been following a fluid restriction of 2 L/day and has restricting dietary sodium content to no more than 2 gm/day.  He reports that he has been compliant with his medications.      Weight:   Wt Readings from Last 3 Encounters:   07/12/19 75.3 kg (166 lb)   06/29/19 79.1 kg (174 lb 6.4 oz)   06/24/19 77.8 kg (171 lb 8.3 oz)       Review of Systems:    Comprehensive 12 point review of system is negative unless described above in HPI    Results     Procedure Component Value Units Date/Time    i-Stat Chem 8 CartrIDge [[id number]]  (Abnormal) Collected: 07/12/19 0901    Specimen: Blood Updated: 07/12/19 1037     Sodium I-Stat 139 mMol/L      Potassium I-Stat 4.0 mMol/L      Chloride I-Stat 101 mMol/L      TCO2 I-Stat 30 mMol/L      Calcium Ionized I-Stat 4.70 mg/dL      Glucose I-Stat 116 mg/dL      Creatinine I-Stat 1.70 mg/dL      BUN I-Stat 37  mg/dL      Anion Gap I-Stat 14.0     EGFR 40 mL/min/1.86m2      Hematocrit I-Stat 35.0 %      Hemoglobin I-Stat 11.9 gm/dL               Medical Problems:  Patient Active Problem List    Diagnosis Date Noted   . Diastolic CHF, acute 10/28/2246   . Nonrheumatic aortic valve stenosis 02/26/2019   . Follow-up exam 01/26/2017   . 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 Chronic anticoagulation 11/25/2016   . Stroke 11/25/2016   . CKD (chronic kidney disease) 11/25/2016   . Former smoker, stopped smoking many years ago 11/25/2016   . Type 2 diabetes mellitus 11/25/2016   . HTN (hypertension) 11/25/2016   . HLD (hyperlipidemia) 11/25/2016   . Diabetic neuropathy 11/25/2016   . COPD (chronic obstructive pulmonary disease) 11/25/2016   . Bipolar disorder 11/25/2016   . BPH (benign prostatic hyperplasia)  11/25/2016   . Atrial fibrillation 11/22/2016   . Chronic atrial fibrillation 10/08/2016   . Stage 4 chronic kidney disease 10/08/2016   . Persistent atrial fibrillation 10/07/2016   . Fatty liver 10/07/2016     Past Medical History:  1. Atrial fibrillation  A. Cardioversion 10/22/11.   B. Echo 10/22/11- moderate-severe concentric LVH. Biatrial enlargement. Mild AS, dilated inferior vena cava, small pericardial effusion. Peak velocity across the AV at 2.2 meters/s.  C. Cardiac abltation 01/13/15.   D. MRI Cardiac 03/16/16- LVEF 68.4%. Moderate septal wall hypertrophy (1.6cm) and mild posterior wall hypertrophy (1.3cm). Focal area of mid-myocardial wall delayed enhancement in basal anteroseptal wall and RV insertion point consistent with hypertensive heart disease. RVEF 41.7%. Severe LA enlargement and moderate RA enlargement. 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 Mild AS with peak velocity 2.91m/s. Mild central AR. Mild MR and TR. Small to moderate pericardial effusion. Mildly dilated ascending aorta. Aortic arch and descending thoracic aorta are ectatic. Trivial right pleural effusion. Dilated central pulm artery at 4.3cm.   E. s/p TEE and [place] cardioversion 06/04/16  F. Convergent AF Ablation 11/22/16 - Procedure was complicated with pericardial effusion/ tamponade requiring pericardiocentesis. He developed left pleural effusion with thoracentesis.   G. Echo 12/03/16- No pericardial effusion, aortic valve is severely calcified with severely restricted systolic opening, mild regurgitation, LV is normal in size with moderate to severe concentric hypertrophy, systolic function is preserved with an EF 25-00%, diastolic dysfunction that couldn't be graded.   H. Echo 12/08/17- EF 60-65%. Moderate concentric LV hypertrophy. LA and RA severely dilated. Mild MR and moderate TR. Moderate valvular aortic stenosis. Mild to moderate AR and pulmonic valvular regurgitation. Mildly  dilated ascending aorta 3.8-4.2 cm.   I. Echo 01/29/19- Moderate to severe concentric LVH, EF 37-04%, grade 2 diastolic dysfunction of the LV (with evidence of increased left atrial pressure), RV size is moderately increased, RV hypertrophy, RV systolic function is mild to moderately decreased, proximal ascending aorta is mildly dilated at 4.2cm. moderate aortic stenosis, mild to moderate insufficiency, mitral valve regurgitation is mild, tricuspid regurgitation is moderate, estimated pulm arterial systolic pressure is severely elevated, 56mmHg, pulm valve regurgitation is mild to moderate.   J. Echo 06/21/2019  ef 65-70%. right ventricular size is mildy increased. The right ventricular systolic function is mildly decreased. Moderate aortic stenosis with peak aortic valve velocity 3.5 m/s and mean pressure gradient of 23 mmHg. Mild to moderate aortic regurgitation. Mild mitral regurgitation.  Moderate tricuspid regurgitation.  Estimated RVSP 93 mmHg.     2. Aorta status  A. 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 (Again there is small area of persistent perfusion of the aneurysm sac which appears to have diminished since prior exam and may be related to partial thrombosis versus technique). At subsequent follow-up, would encourage delayed evaluation in addition to arterial phase evaluation. Maximal aneurysmal dilatation involving the descending segment measures 45 x 39 mm.     3. Hypertension.  4. Hyperlipidemia.   5. Type I Diabetes Mellitus.  6. Bipolar disorder.  7. Fatty liver.  8. Renal insufficiency.  A. Normal renal ultrasound October 23, 2011.  9. Basilar Airway disease  Past Medical History:   Diagnosis Date   . Arrhythmia    . Arthritis    . Atrial fibrillation    . Atrial flutter    . Bipolar affective    . BPH (benign prostatic hyperplasia)    . Complication of anesthesia     resp. asessment   .  Diabetes mellitus    . Diabetic neuropathy    . Gout    . Heart murmur    . HOH (hard of hearing)    . Hyperlipidemia    . Hypertension    . Paroxysmal atrial fibrillation    . Pulmonary hypertension    . Renal insufficiency    . Type 2 diabetes mellitus, controlled    . Wears glasses      Past Surgical History:   has a past surgical history that includes Appendectomy; Hernia repair; TONSILLECTOMY, ADENOIDECTOMY; CARDIAC ABLATION; Cardioversion; TEE; Cardiac catheterization; Tonsillectomy; Pericardiocentesis (Left, 11/25/2016); FLUORO-NO CHARGE (N/A, 11/22/2016); Paracentesis (2011); and Paracentesis (06/22/2019).  Family History:  family history includes Diabetes in his brother, mother, and sister; Heart disease in his brother and mother; Hyperlipidemia in his brother, brother, and sister; Hypertension in his brother, brother, daughter, mother, and sister; Kidney disease in his brother.  Social History:   reports that he quit smoking about 25 years ago. His smoking use included cigarettes. He has a 12.00 pack-year smoking history. He has never used smokeless tobacco. He reports that he does not drink alcohol or use drugs.     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  Medications:    Current Outpatient Medications:   .  allopurinol (ZYLOPRIM) 300 MG tablet, Take 300 mg by mouth daily., Disp: , Rfl:   .  amiodarone (PACERONE) 200 MG tablet, Take 1 tablet (200 mg total) by mouth daily, Disp: 90 tablet, Rfl: 1  .  atenolol (TENORMIN) 100 MG tablet, Take 1 tablet (100 mg total) by mouth daily, Disp: 90 tablet, Rfl: 3  .  divalproex EC/DR tablet (DEPAKOTE EC/DR) 250 MG EC tablet, Take 250 mg by mouth 2 (two) times daily, Disp: , Rfl:   .  empagliflozin (Jardiance) 10 MG tablet, Take 1 tablet (10 mg total) by mouth every morning, Disp:  , Rfl:   .  gabapentin  (NEURONTIN) 100 MG capsule, Take 100 mg by mouth 2 (two) times daily., Disp: , Rfl:   .  isosorbide mononitrate (IMDUR) 60 MG 24 hr tablet, Take 1 tablet (60 mg total) by mouth daily, Disp: 30 tablet, Rfl:  11  .  Multiple Vitamin (MULTIVITAMIN) capsule, Take 1 capsule by mouth daily., Disp: , Rfl:   .  pravastatin (PRAVACHOL) 40 MG tablet, Take 40 mg by mouth every evening., Disp: , Rfl:   .  rivaroxaban (Xarelto) 15 MG Tab, Take 1 tablet (15 mg total) by mouth daily with dinner, Disp: 30 tablet, Rfl: 11  .  SENNA CO, by Combination route daily as needed., Disp: , Rfl:   .  tamsulosin (FLOMAX) 0.4 MG Cap, Take 0.4 mg by mouth daily., Disp: , Rfl:   .  torsemide (DEMADEX) 20 MG tablet, Take 20 mg by mouth daily, Disp: , Rfl:   .  acetaminophen (TYLENOL) 500 MG tablet, Take 500 mg by mouth every 6 (six) hours as needed for Pain., Disp: , Rfl:   .  Loperamide HCl (IMODIUM PO), Take by mouth daily as needed., Disp: , Rfl:     Vitals:    Visit Vitals  BP (!) 174/94   Pulse (!) 51   Resp 15   Wt 75.3 kg (166 lb)   SpO2 96%   BMI 26.00 kg/m     Weight is down 8 lbs     Physical Exam:   General: well-developed male, alert, NAD  HEENT:  Anicteric sclera, supple  Lungs: CTA B/L, no rhonchi, wheezes or crackles with normal rate and effort  Cardiovascular: s1s2, normal rate, regular rhythm, ii/vi sem Abdominal: obese non tender.   Extremities: warm to touch, 1+ edema.  Neuromuscular exam: grossly non-focal, though not formally tested.    Laboratory Data:  Lab Results   Component Value Date/Time    WBC 13.3 (H) 12/09/2016 07:10 AM    RBC 3.14 (L) 12/09/2016 07:10 AM    HGB 8.9 (L) 12/09/2016 07:10 AM    HCT 29.2 (L) 12/09/2016 07:10 AM    PLT 621 (H) 12/09/2016 07:10 AM    TSH 4.57 (H) 06/14/2019 11:56 AM     Lab Results   Component Value Date/Time    NA 143 07/04/2019 07:36 AM    K 4.6 07/04/2019 07:36 AM    CL 107 07/04/2019 07:36 AM    CO2 26 07/04/2019 07:36 AM    GLU 129 (H) 07/04/2019 07:36 AM    BUN 52 (H) 07/04/2019  07:36 AM    CREAT 2.16 (H) 07/04/2019 07:36 AM    PROT 7.2 06/20/2019 01:38 PM    ALKPHOS 140 06/20/2019 01:38 PM    AST 18 06/20/2019 01:38 PM    ALT 11 06/20/2019 01:38 PM     Lab Results   Component Value Date/Time    CHOL 70 (L) 11/26/2016 03:02 AM    TRIG 78 11/26/2016 03:02 AM    HDL 21 (L) 11/26/2016 03:02 AM    LDL 33 11/26/2016 03:02 AM     Lab Results   Component Value Date/Time    HGBA1CPERCNT 6.5 11/26/2016 03:02 AM     Lab Results   Component Value Date/Time    BNP 1,041.5 (H) 11/15/2016 11:10 AM     06/29/2019  Sodium 143  Potassium 4.8  CO2 29  Glucose 107  BUN 51  Creatinine 2.09    Assessment and Plan:  1. Chronic diastolic heart failure  Basic Metabolic Panel    Magnesium   2. Persistent atrial fibrillation     3. Chronic anticoagulation     4. Chronic kidney disease, unspecified CKD stage  Basic Metabolic Panel    Magnesium   5. Essential hypertension  Basic  Metabolic Panel    Magnesium         My impression is that, Mr. Kohlenberg has HFpEF.  Appears to be doing well on his current regimen of medications.  Recommend continuing low-dose torsemide.  Lab work today shows stable renal function.  He is scheduled for repeat lab work with his primary care provider in the next 2 weeks.  If he continues with significant diuresis with Jardiance and torsemide may need to decrease torsemide.  Blood pressure moderately elevated in clinic today.  Will increase Imdur to 60 mg daily.  If he develops a headache he can cut back to 30 mg daily.  Will have patient return to clinic in 6 weeks or sooner if needed    Counseling:  -I have encouraged pt to continue to follow a <2gm/day Na restriction and limit fluid intake to < 2L/day.  -I have requested pt perform daily wts and contact our office for increase of >2-3lbs in 1 day or 5lbs in 1 week.   -I have encouraged pt to engage in aerobic activity as tolerated and avoid heavy weight lifting.   -Smoking/Alcohol: I have advised pt to abstain from any tobacco use and  excessive alcohol intake.    -Weight: Obesity is a significant risk for both short and long-term morbidity and mortality for AHF patients.  I have stressed the importance of weight control and dietary management.     Our office is available to answer any questions you or the patient may have. We appreciate the opportunity to participate in the care of your patients.      Electronically signed by:    Norval Gable. Abigail Butts, FNP-C  Clinical Coordinator for Advance Heart Failure and [place] and [facility]  [address]  [place], [place] [address]  [phone]   [phone] - portable phone  [phone] - fax         Note: This chart was generated by the [facility] EMR system/speech recognition and may contain inherit omission or errors not intended by the user.  Grammatical errors, random word insertions, deletions, pronoun errors and incomplete sentences are occasionally consequences of this technology due to software limitations.  Not all errors are caught or corrected.  If there are questions or concerns about the content of this note or information contained in the body of this dictation they should be addressed directly with the author for clarification.

## 2019-07-24 ENCOUNTER — Telehealth: Payer: Self-pay | Admitting: Family Nurse Practitioner

## 2019-07-24 NOTE — Telephone Encounter (Signed)
Reviewed lab work with patient     07/23/2019  Sodium 142  Potassium 3.6  co2 28  Glucose 152  BUN 30  Creatinine 1.4  AST 12  ALT 10  Hemoglobin A1c 6.2  Magnesium 2.5  Uric acid 4.3  Hemoglobin 8.9  Hematocrit 32.3  Cholesterol 80  Triglycerides 61  HDL 30  LDL 38    Patient states that he is doing well. I do not recommend medication changes at this time.

## 2019-07-27 ENCOUNTER — Telehealth: Payer: Self-pay

## 2019-07-27 NOTE — Telephone Encounter (Signed)
Requested ov, labs from pcp

## 2019-07-27 NOTE — Telephone Encounter (Signed)
NOTE AND LABS RECEIVED AND SCANNED TO 12.18.20 SCAN ONLY

## 2019-08-02 ENCOUNTER — Encounter: Payer: Self-pay | Admitting: Medical

## 2019-08-02 ENCOUNTER — Telehealth: Payer: No Typology Code available for payment source | Admitting: Medical

## 2019-08-02 VITALS — BP 142/68 | HR 55 | Ht 67.0 in | Wt 157.0 lb

## 2019-08-02 DIAGNOSIS — N189 Chronic kidney disease, unspecified: Secondary | ICD-10-CM

## 2019-08-02 DIAGNOSIS — I10 Essential (primary) hypertension: Secondary | ICD-10-CM

## 2019-08-02 DIAGNOSIS — I5031 Acute diastolic (congestive) heart failure: Secondary | ICD-10-CM

## 2019-08-02 DIAGNOSIS — I4819 Other persistent atrial fibrillation: Secondary | ICD-10-CM

## 2019-08-02 DIAGNOSIS — I71019 Dissection of thoracic aorta, unspecified: Secondary | ICD-10-CM

## 2019-08-02 DIAGNOSIS — I350 Nonrheumatic aortic (valve) stenosis: Secondary | ICD-10-CM

## 2019-08-02 DIAGNOSIS — Z7901 Long term (current) use of anticoagulants: Secondary | ICD-10-CM

## 2019-08-02 NOTE — Progress Notes (Signed)
Cardiology Visit  via Telehealth      [name]   Date of Birth: Sep 30, 1950    Provider: Linden Dolin, PA     Patient Care Team:  [name], MD as PCP - General (Family Medicine)  [name], DO as Consulting Physician (Cardiology)  [name], MD as Consulting Physician (Thoracic and Cardiac Surgery)    Chief Complaint: Follow-up      This is a telehealth visit which was conducted with the use of interactive HIPPA compliant Telehealth visit - [employer] telecommunication that permitted real time communication between [name] and myself.     He consented to participation and received services at home, while I was located at [facility].     This visit was changed from an in-person visit to a telehealth visit to lower the risk of exposure and / or spread of the current pandemic with the SARS CoV-2 virus. This is based on the guidelines from the [facility] and other health agencies.      History of Present Illness   [name] is a 68 y.o. male being seen today for 4 week f/u.     He has a history of atrial fibrillation.He is s/pconvergentAF ablation 11/22/16.He was on amiodarone but this was inadvertently stopped because his prescription ran out. At his f/u appointment in January, he was found to be back in atrial fib. He was restarted on amiodarone and underwent repeat CV 08/30/18. This was successful and he has been maintaining SR.     More recently, he has had ssues with congestive heart failure.  He was admitted 11/11- 11/16 with an acute right heart failure exacerbation after failing outpatient diuresis.  He was placed on a Lasix drip with good result.  He underwent paracentesis on 06/22/2019.  He was discharged to follow-up with the heart failure clinic.    TTE 06/21/2019 noted for LVEF 65-70%, flattened septum in systole and diastole consistent with RV pressure and volume overload. RV systolic function  mildly decreased. Moderate AS with peak aortic valve velocity 3.5 m/s and mean pressure gradient of 23 mmHg, mild to moderate AR, mild MR, moderate TR- estimated RVSP 93 mmHg.     He states that he has felt much better since discharge.  His weights have run between 154 and 157.  He denies chest pain or shortness of breath.  No edema or ascites.  Blood pressures have been stable.  He was started on iron by his PCP.  Renal function has been stable.  [name] started him on isosorbide.    Past Medical History     Past Medical History 08/02/19   Labs in epic 07/04/19    1. Atrial fibrillation  A. Cardioversion 10/22/11.   B. Echo 10/22/11- moderate-severe concentric LVH. Biatrial enlargement. Mild AS, dilated inferior vena cava, small pericardial effusion. Peak velocity across the AV at 2.2 meters/s.  C. Cardiac abltation 01/13/15.   D. MRI Cardiac 03/16/16- LVEF 68.4%. Moderate septal wall hypertrophy (1.6cm) and mild posterior wall hypertrophy (1.3cm). Focal area of mid-myocardial wall delayed enhancement in basal anteroseptal wall and RV insertion point consistent with hypertensive heart disease. RVEF 41.7%. Severe LA enlargement and moderate RA enlargement. 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 Mild AS with peak velocity 2.52m/s. Mild central AR. Mild MR and TR. Small to moderate pericardial effusion. Mildly dilated ascending aorta. Aortic arch and descending thoracic  aorta are ectatic. Trivial right pleural effusion. Dilated central pulm artery at 4.3cm.   E. s/p TEE and [place] cardioversion 06/04/16  F. Convergent AF Ablation 11/22/16 - Procedure was complicated with pericardial effusion/ tamponade requiring pericardiocentesis. He developed left pleural effusion with thoracentesis.   G. Echo 12/03/16- No pericardial effusion, aortic valve is severely calcified with severely restricted systolic opening, mild regurgitation, LV is normal in size with moderate to severe  concentric hypertrophy, systolic function is preserved with an EF 89-38%, diastolic dysfunction that couldn't be graded.   H. Echo 12/08/17- EF 60-65%. Moderate concentric LV hypertrophy. LA and RA severely dilated. Mild MR and moderate TR. Moderate valvular aortic stenosis. Mild to moderate AR and pulmonic valvular regurgitation. Mildly dilated ascending aorta 3.8-4.2 cm.   I. Echo 01/29/19- Moderate to severe concentric LVH, EF 10-17%, grade 2 diastolic dysfunction of the LV (with evidence of increased left atrial pressure), RV size is moderately increased, RV hypertrophy, RV systolic function is mild to moderately decreased, proximal ascending aorta is mildly dilated at 4.2cm. moderate aortic stenosis, mild to moderate insufficiency, mitral valve regurgitation is mild, tricuspid regurgitation is moderate, estimated pulm arterial systolic pressure is severely elevated, 20mmHg, pulm valve regurgitation is mild to moderate.   J. ECHO 06/21/19- LV cavity size is normal. There is a flattened septum in systole and diastole consistent \\w / RV pressure and volume overload. EF 65-70%. LV walls are upper normal in thickness. RV size is mildy increased. RV systolic function is mildly decreased. Mod aortic stenosis w/ peak aortic valve velocity 3.5 m/s and mean pressure gradient of 23 mmHg. Mild to mod aortic regurgitation. Mild mitral regurgitation. Mod tricuspid regurgitation. Estimated RVSP 93 mmHg.     2. Aorta status  A. 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 (Again there is small area of persistent perfusion of the aneurysm sac which appears to have diminished since prior exam and may be related to partial thrombosis versus technique). At subsequent follow-up, would encourage delayed evaluation in addition to arterial phase evaluation. Maximal aneurysmal dilatation involving the descending segment measures 45 x  39 mm.     3. Hypertension.  4. Hyperlipidemia.   5. Type I Diabetes Mellitus.  6. Bipolar disorder.  7. Fatty liver.  8. Renal insufficiency.  A. Normal renal ultrasound October 23, 2011.  9. Basilar Airway disease    Family History     Family History   Problem Relation Age of Onset   . Hypertension Mother    . Diabetes Mother    . Heart disease Mother    . Hypertension Sister    . Diabetes Sister    . Hyperlipidemia Sister    . Hypertension Brother    . Heart disease Brother    . Hyperlipidemia Brother    . Hypertension Brother    . Diabetes Brother    . Hyperlipidemia Brother    . Kidney disease Brother    . Hypertension Daughter        Social History     Social History     Tobacco Use   . Smoking status: Former Smoker     Packs/day: 0.30     Years: 40.00     Pack years: 12.00     Types: Cigarettes     Quit date: 1995     Years since quitting: 25.9   . Smokeless tobacco: Never Used   Substance Use Topics   .  Alcohol use: No   . Drug use: No     Medications     Current Outpatient Medications:   .  acetaminophen (TYLENOL) 500 MG tablet, Take 500 mg by mouth every 6 (six) hours as needed for Pain., Disp: , Rfl:   .  allopurinol (ZYLOPRIM) 300 MG tablet, Take 300 mg by mouth daily., Disp: , Rfl:   .  amiodarone (PACERONE) 200 MG tablet, Take 1 tablet (200 mg total) by mouth daily, Disp: 90 tablet, Rfl: 1  .  atenolol (TENORMIN) 100 MG tablet, Take 1 tablet (100 mg total) by mouth daily, Disp: 90 tablet, Rfl: 3  .  divalproex EC/DR tablet (DEPAKOTE EC/DR) 250 MG EC tablet, Take 250 mg by mouth 2 (two) times daily, Disp: , Rfl:   .  empagliflozin (Jardiance) 10 MG tablet, Take 1 tablet (10 mg total) by mouth every morning, Disp:  , Rfl:   .  ferrous sulfate 325 (65 FE) MG tablet, Take 325 mg by mouth every morning with breakfast, Disp: , Rfl:   .  gabapentin (NEURONTIN) 100 MG capsule, Take 100 mg by mouth 2 (two) times daily., Disp: , Rfl:   .  isosorbide mononitrate (IMDUR) 60 MG 24 hr tablet, Take 1 tablet (60 mg  total) by mouth daily, Disp: 30 tablet, Rfl: 11  .  Loperamide HCl (IMODIUM PO), Take by mouth daily as needed., Disp: , Rfl:   .  Multiple Vitamin (MULTIVITAMIN) capsule, Take 1 capsule by mouth daily., Disp: , Rfl:   .  pravastatin (PRAVACHOL) 40 MG tablet, Take 40 mg by mouth every evening., Disp: , Rfl:   .  rivaroxaban (Xarelto) 15 MG Tab, Take 1 tablet (15 mg total) by mouth daily with dinner, Disp: 30 tablet, Rfl: 11  .  SENNA CO, by Combination route daily as needed., Disp: , Rfl:   .  tamsulosin (FLOMAX) 0.4 MG Cap, Take 0.4 mg by mouth daily., Disp: , Rfl:   .  torsemide (DEMADEX) 20 MG tablet, Take 20 mg by mouth daily, Disp: , Rfl:   Physical Exam      Vitals:    08/02/19 0832   BP: 142/68   Pulse: (!) 55   Weight: 71.2 kg (157 lb)   Height: 1.702 m (5\' 7" )     Wt Readings from Last 3 Encounters:   08/02/19 71.2 kg (157 lb)   07/12/19 75.3 kg (166 lb)   06/29/19 79.1 kg (174 lb 6.4 oz)        General appearance - alert, well appearing, and in no distress  Head: normocephalic, atraumatic  Mental status -  Alert and oriented,   Eyes - extraocular eye movements intact  Neck -  Supple, no thyromegaly      Cardiovascular system -   No jugular venous distension    Neurological - alert, oriented x3, no obvious gross neurological deficits  Extremities -  no clubbing or cyanosis.  No edema bilateral  Psych- appropriate affect      Labs     Lab Results   Component Value Date/Time    WBC 13.3 (H) 12/09/2016 07:10 AM    RBC 3.14 (L) 12/09/2016 07:10 AM    HGB 8.9 (L) 12/09/2016 07:10 AM    HCT 29.2 (L) 12/09/2016 07:10 AM    PLT 621 (H) 12/09/2016 07:10 AM    TSH 4.57 (H) 06/14/2019 11:56 AM     Lab Results   Component Value Date/Time    NA 143 07/04/2019  07:36 AM    K 4.6 07/04/2019 07:36 AM    CL 107 07/04/2019 07:36 AM    CO2 26 07/04/2019 07:36 AM    GLU 129 (H) 07/04/2019 07:36 AM    BUN 52 (H) 07/04/2019 07:36 AM    CREAT 1.70 (H) 07/12/2019 09:01 AM    CREAT 2.16 (H) 07/04/2019 07:36 AM    PROT 7.2 06/20/2019  01:38 PM    ALKPHOS 140 06/20/2019 01:38 PM    AST 18 06/20/2019 01:38 PM    ALT 11 06/20/2019 01:38 PM     Lab Results   Component Value Date/Time    CHOL 70 (L) 11/26/2016 03:02 AM    TRIG 78 11/26/2016 03:02 AM    HDL 21 (L) 11/26/2016 03:02 AM    LDL 33 11/26/2016 03:02 AM     Lab Results   Component Value Date/Time    HGBA1CPERCNT 6.5 11/26/2016 03:02 AM     Lab Results   Component Value Date/Time    BNP 1,041.5 (H) 11/15/2016 11:10 AM       Impression and Recommendations:       1. PAF (paroxysmal atrial fibrillation)  s/p convergent AF ablationwith recurrent AF and CV 1/20. He is maintaining SR. Continue amiodarone and xarelto (renal dosed).    2. On amiodarone therapy  TSH and CMP due 12/2019. PFTs due 10/2019.      3. Aortic dissection, thoracic  Keep f/u with Dr Kathrin Ruddy, stable on last CT    4. Aortic valve stenosis, etiology of cardiac valve disease unspecified  Moderate    5. Pulmonary HTN  6. RH failure  RH enlargement with severe pulm HTN. CT showed no interstitial lung disease.  He is now being followed at the heart failure clinic.  Significant clinical improvement since discharge.    Return as scheduled.    Electronically signed by:   Linden Dolin, PA  08/02/2019

## 2019-08-23 ENCOUNTER — Telehealth: Payer: Self-pay

## 2019-08-23 NOTE — Telephone Encounter (Signed)
Recent records in chart scanned enc 07-27-19

## 2019-08-29 ENCOUNTER — Encounter: Payer: Self-pay | Admitting: Internal Medicine

## 2019-08-29 ENCOUNTER — Telehealth: Payer: No Typology Code available for payment source | Admitting: Internal Medicine

## 2019-08-29 VITALS — BP 184/95 | HR 55 | Ht 67.0 in | Wt 154.2 lb

## 2019-08-29 DIAGNOSIS — I480 Paroxysmal atrial fibrillation: Secondary | ICD-10-CM

## 2019-08-29 DIAGNOSIS — I71019 Dissection of thoracic aorta, unspecified: Secondary | ICD-10-CM

## 2019-08-29 DIAGNOSIS — I5031 Acute diastolic (congestive) heart failure: Secondary | ICD-10-CM

## 2019-08-29 DIAGNOSIS — M21371 Foot drop, right foot: Secondary | ICD-10-CM

## 2019-08-29 DIAGNOSIS — I350 Nonrheumatic aortic (valve) stenosis: Secondary | ICD-10-CM

## 2019-08-29 NOTE — Progress Notes (Signed)
Cardiology Visit  via Telehealth      [name]   Date of Birth: April 27, 1951    Provider: [name], DO     Patient Care Team:  [name] Delphia Grates, MD as PCP - General (Family Medicine)  [name], DO as Consulting Physician (Cardiology)  [name], MD as Consulting Physician (Thoracic and Cardiac Surgery)    Chief Complaint: Atrial Fibrillation      This is a telehealth visit which was conducted with the use of interactive HIPPA compliant audio only, video option unavailable to patient telecommunication that permitted real time communication between [name] and myself.     He consented to participation and received services at home, while I was located at [facility].     This visit was changed from an in-person visit to a telehealth visit to lower the risk of exposure and / or spread of the current pandemic with the SARS CoV-2 virus. This is based on the guidelines from the [facility] and other health agencies.    Impression:      1. Aortic dissection, thoracic    2. Diastolic CHF, acute    3. PAF (paroxysmal atrial fibrillation)      Plan:          All of the patient's questions were answered to his satisfaction, and he is in agreement with the above listed plan of care.     History of Present Illness   [name] is a 69 y.o. male being seen today for atrial fibrillation.     He has a history of atrial fibrillation, hypertension, hyperlipidemia, and diabetes. S/p ablation in 2016. S/p convergent AF ablation in 2017. His echo from 06/21/19 demonstrated EF of 65-70% with a flattened septum in systole and diastole consistent with RV pressure and volume overload, moderate aortic stenosis, mild to moderate aortic regurgitation, mild mitral regurgitation, and moderate tricuspid regurgitation.        Past Medical History     Past Medical History 08/29/19- DVA/MB    LOV 08/02/19 Lattie Haw), 08/10/18 (DVA)  Labs 07/23/19  listed below     1. Atrial fibrillation  A. Cardioversion 10/22/11.   B. Echo 10/22/11- moderate-severe concentric LVH. Biatrial enlargement. Mild AS, dilated inferior vena cava, small pericardial effusion. Peak velocity across the AV at 2.2 meters/s.  C. Cardiac abltation 01/13/15.   D. MRI Cardiac 03/16/16- LVEF 68.4%. Moderate septal wall hypertrophy (1.6cm) and mild posterior wall hypertrophy (1.3cm). Focal area of mid-myocardial wall delayed enhancement in basal anteroseptal wall and RV insertion point consistent with hypertensive heart disease. RVEF 41.7%. Severe LA enlargement and moderate RA enlargement. 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 Mild AS with peak velocity 2.5m/s. Mild central AR. Mild MR and TR. Small to moderate pericardial effusion. Mildly dilated ascending aorta. Aortic arch and descending thoracic aorta are ectatic. Trivial right pleural effusion. Dilated central pulm artery at 4.3cm.   E. s/p TEE and [place] cardioversion 06/04/16  F. Convergent AF Ablation 11/22/16 - Procedure was complicated with pericardial effusion/ tamponade requiring pericardiocentesis. Developed left pleural effusion with thoracentesis.   G. Echo 12/03/16- Aortic valve is severely calcified with severely restricted systolic opening, mild regurgitation, LV is normal in size with moderate to severe concentric hypertrophy, systolic function is preserved with an EF 55-60%.  H. Echo 12/08/17- EF 60-65%. Moderate concentric LVH. LA and RA severely dilated. Mild MR and  moderate TR. Mod aortic stenosis. Mild to moderate AR and pulmonic valvular regurgitation. Mildly dilated ascending aorta 3.8-4.2 cm.   I. Echo 01/29/19- Moderate to severe concentric LVH, EF 35-82%, grade 2 diastolic dysfunction of the LV (with evidence of increased LA pressure), RV size is moderately increased, RV hypertrophy, RV systolic function is mild to mod decreased, proximal ascending aorta is mildly dilated  at 4.2cm. mod aortic stenosis, mild to mod insufficiency, mitral valve regurgitation is mild, tricuspid regurgitation is moderate, estimated pulm arterial systolic pressure is severely elevated, 30mmHg, pulm valve regurgitation is mild to moderate.   J. Echo 06/21/19- LV cavity size is normal. There is a flattened septum in systole and diastole consistent \\w / RV pressure and volume overload. EF 65-70%. LV walls are upper normal in thickness. RV size is mildy increased. RV systolic function is mildly decreased. Mod aortic stenosis w/ peak aortic valve velocity 3.5 m/s and mean pressure gradient of 23 mmHg. Mild to mod aortic regurgitation. Mild mitral regurgitation. Mod tricuspid regurgitation. RVSP 93 mmHg.     2. Aorta status  A. 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 (Again there is small area of persistent perfusion of the aneurysm sac which appears to have diminished since prior exam and may be related to partial thrombosis versus technique). At subsequent follow-up, would encourage delayed evaluation in addition to arterial phase evaluation. Maximal aneurysmal dilatation involving the descending segment measures 45 x 39 mm.     3. Hypertension.  4. Hyperlipidemia.   5. Type I Diabetes Mellitus.  6. Bipolar disorder.  7. Fatty liver.  8. Renal insufficiency.  A. Normal renal ultrasound October 23, 2011.  9. Basilar Airway disease     LABS 07/23/19- NA 142, K 3.6, GLU 162, BUN 30, CREAT 1.4, ALB 4.0, GLOB 2.5, AST 12, ALT 10, GFR 51, A1C 6.2, WBC 5.6, RBC 4.09, HGB 8.9, HCT 32.3, PLT 262, CHOL 80, TRIG 61, HDL 30, LDL 38    He has a history of atrial fibrillation, hypertension, hyperlipidemia, and diabetes. S/p ablation in 2016. S/p convergent AF ablation in 2017. His echo from 06/21/19 demonstrated EF of 65-70% with a flattened septum in systole and diastole consistent with RV pressure and volume overload, moderate aortic  stenosis, mild to moderate aortic regurgitation, mild mitral regurgitation, and moderate tricuspid regurgitation.    Family History     Family History   Problem Relation Age of Onset   . Hypertension Mother    . Diabetes Mother    . Heart disease Mother    . Hypertension Sister    . Diabetes Sister    . Hyperlipidemia Sister    . Hypertension Brother    . Heart disease Brother    . Hyperlipidemia Brother    . Hypertension Brother    . Diabetes Brother    . Hyperlipidemia Brother    . Kidney disease Brother    . Hypertension Daughter        Social History     Social History     Tobacco Use   . Smoking status: Former Smoker     Packs/day: 0.30     Years: 40.00     Pack years: 12.00     Types: Cigarettes     Quit date: 1995     Years since quitting: 26.0   . Smokeless tobacco: Never Used   Substance Use Topics   . Alcohol use: No   .  Drug use: No     Medications     Current Outpatient Medications:   .  acetaminophen (TYLENOL) 500 MG tablet, Take 500 mg by mouth every 6 (six) hours as needed for Pain., Disp: , Rfl:   .  allopurinol (ZYLOPRIM) 300 MG tablet, Take 300 mg by mouth daily., Disp: , Rfl:   .  amiodarone (PACERONE) 200 MG tablet, Take 1 tablet (200 mg total) by mouth daily, Disp: 90 tablet, Rfl: 1  .  atenolol (TENORMIN) 100 MG tablet, Take 1 tablet (100 mg total) by mouth daily, Disp: 90 tablet, Rfl: 3  .  divalproex EC/DR tablet (DEPAKOTE EC/DR) 250 MG EC tablet, Take 500 mg by mouth 2 (two) times daily  , Disp: , Rfl:   .  empagliflozin (Jardiance) 10 MG tablet, Take 1 tablet (10 mg total) by mouth every morning, Disp:  , Rfl:   .  ferrous sulfate 325 (65 FE) MG tablet, Take 325 mg by mouth every morning with breakfast, Disp: , Rfl:   .  gabapentin (NEURONTIN) 100 MG capsule, Take 100 mg by mouth 2 (two) times daily., Disp: , Rfl:   .  isosorbide mononitrate (IMDUR) 60 MG 24 hr tablet, Take 1 tablet (60 mg total) by mouth daily, Disp: 30 tablet, Rfl: 11  .  Loperamide HCl (IMODIUM PO), Take by mouth daily  as needed., Disp: , Rfl:   .  Multiple Vitamin (MULTIVITAMIN) capsule, Take 1 capsule by mouth daily., Disp: , Rfl:   .  pravastatin (PRAVACHOL) 40 MG tablet, Take 40 mg by mouth every evening., Disp: , Rfl:   .  rivaroxaban (Xarelto) 15 MG Tab, Take 1 tablet (15 mg total) by mouth daily with dinner, Disp: 30 tablet, Rfl: 11  .  SENNA CO, by Combination route daily as needed., Disp: , Rfl:   .  tamsulosin (FLOMAX) 0.4 MG Cap, Take 0.4 mg by mouth daily., Disp: , Rfl:   .  torsemide (DEMADEX) 20 MG tablet, Take 20 mg by mouth daily, Disp: , Rfl:   Visit Vitals      Vitals:    08/29/19 0909   BP: (!) 184/95   Pulse: (!) 55   Weight: 69.9 kg (154 lb 3.2 oz)   Height: 1.702 m (5\' 7" )     Wt Readings from Last 3 Encounters:   08/29/19 69.9 kg (154 lb 3.2 oz)   08/02/19 71.2 kg (157 lb)   07/12/19 75.3 kg (166 lb)       Labs     Lab Results   Component Value Date/Time    WBC 13.3 (H) 12/09/2016 07:10 AM    RBC 3.14 (L) 12/09/2016 07:10 AM    HGB 8.9 (L) 12/09/2016 07:10 AM    HCT 29.2 (L) 12/09/2016 07:10 AM    PLT 621 (H) 12/09/2016 07:10 AM    TSH 4.57 (H) 06/14/2019 11:56 AM     Lab Results   Component Value Date/Time    NA 143 07/04/2019 07:36 AM    K 4.6 07/04/2019 07:36 AM    CL 107 07/04/2019 07:36 AM    CO2 26 07/04/2019 07:36 AM    GLU 129 (H) 07/04/2019 07:36 AM    BUN 52 (H) 07/04/2019 07:36 AM    CREAT 1.70 (H) 07/12/2019 09:01 AM    CREAT 2.16 (H) 07/04/2019 07:36 AM    PROT 7.2 06/20/2019 01:38 PM    ALKPHOS 140 06/20/2019 01:38 PM    AST 18 06/20/2019 01:38 PM  ALT 11 06/20/2019 01:38 PM     Lab Results   Component Value Date/Time    CHOL 70 (L) 11/26/2016 03:02 AM    TRIG 78 11/26/2016 03:02 AM    HDL 21 (L) 11/26/2016 03:02 AM    LDL 33 11/26/2016 03:02 AM     Lab Results   Component Value Date/Time    HGBA1CPERCNT 6.5 11/26/2016 03:02 AM     Lab Results   Component Value Date/Time    BNP 1,041.5 (H) 11/15/2016 11:10 AM          Respectfully,   [name], DO  08/29/2019  Electronically signed  by:  [name], DO    This note was scribed by Lindell Noe on behalf of [name], DO

## 2019-08-29 NOTE — Progress Notes (Signed)
Cardiology Visit  via Telehealth      [name]   Date of Birth: 19-Jan-1951    Provider: [name], DO     Patient Care Team:  [name] Delphia Grates, MD as PCP - General (Family Medicine)  [name], DO as Consulting Physician (Cardiology)  [name], MD as Consulting Physician (Thoracic and Cardiac Surgery)    Chief Complaint: Atrial Fibrillation      This is a telehealth visit which was conducted with the use of interactive HIPPA compliant audio only, video option unavailable to patient telecommunication that permitted real time communication between [name] and myself.     He consented to participation and received services at home, while I was located at [facility].     This visit was changed from an in-person visit to a telehealth visit to lower the risk of exposure and / or spread of the current pandemic with the SARS CoV-2 virus. This is based on the guidelines from the [facility] and other health agencies.    Impression:      1. Aortic dissection, thoracic    2. Moderate aortic stenosis    3. PAF (paroxysmal atrial fibrillation)    4. Diastolic CHF, acute    5. Foot drop, right  - [place]; Future  - MRI Brain WO Contrast; Future      Plan:      1. Thoracic aortic dissection. Being followed by Dr. Theotis Burrow. He has been previously seen by [name] as well. He has a follow up in June this year to assess his aneurysm and moderate aortic stenosis.     2. Moderate aortic stenosis. Shown on most recent echocardiogram. We will need to repeat his echo later this year.      3. Paroxysmal atrial fibrillation. CHADsVASC score of 3. On xarelto for stroke prophylaxis in the setting of atrial fibrillation. Being managed on amiodarone for rhythm control. Continue current medical therapy.     4. Right-sided CHF with pulmonary hypertension. He has a follow up visit with Bronwen Betters in the heart  failure clinic tomorrow. He has had no recent fluid gain.    5. Right foot drop. He had a TIA-like episode post convergent ablation. He has been seen by a neurologist a few years ago however an MRI was not done. He has a new onset of a right foot drop, starting about two months ago. I ordered an MRI/MRA brain today to evaluate this issue.    6. I will see him back in 3 months.     All of the patient's questions were answered to his satisfaction, and he is in agreement with the above listed plan of care.     History of Present Illness   [name] is a 69 y.o. male being seen today for atrial fibrillation.     He has a history of atrial fibrillation, hypertension, hyperlipidemia, and diabetes. S/p ablation in 2016. S/p convergent AF ablation in 2017. His echo from 06/21/19 demonstrated EF of 65-70% with a flattened septum in systole and diastole consistent with RV pressure and volume overload, moderate aortic stenosis, mild to moderate aortic regurgitation, mild mitral regurgitation, and moderate tricuspid regurgitation.    He has been following up with possibly Dr. Theotis Burrow for his thoracic aortic aneurysm however he cannot remember the physician's name at this time. He has a visit with the surgeons again in June  this year.     He states that the swelling around his abdomen is gone now. He is being followed for this as well and he has a visit again tomorrow.     His only complaint today is a bump on his head due to a fall. He also says that his right leg is not as strong and he has to drag it around. He says that this started about two months ago. He has not had an MRI done recently however has been seen by a neurologist, Dr. Vickki Muff, about 2-3 years ago.     Past Medical History     Past Medical History 08/29/19- DVA/MB    LOV 08/02/19 Lattie Haw), 08/10/18 (DVA)  Labs 07/23/19 listed below     1. Atrial fibrillation  A. Cardioversion 10/22/11.   B. Echo 10/22/11- moderate-severe concentric LVH. Biatrial enlargement. Mild AS,  dilated inferior vena cava, small pericardial effusion. Peak velocity across the AV at 2.2 meters/s.  C. Cardiac abltation 01/13/15.   D. MRI Cardiac 03/16/16- LVEF 68.4%. Moderate septal wall hypertrophy (1.6cm) and mild posterior wall hypertrophy (1.3cm). Focal area of mid-myocardial wall delayed enhancement in basal anteroseptal wall and RV insertion point consistent with hypertensive heart disease. RVEF 41.7%. Severe LA enlargement and moderate RA enlargement. 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 Mild AS with peak velocity 2.67m/s. Mild central AR. Mild MR and TR. Small to moderate pericardial effusion. Mildly dilated ascending aorta. Aortic arch and descending thoracic aorta are ectatic. Trivial right pleural effusion. Dilated central pulm artery at 4.3cm.   E. s/p TEE and [place] cardioversion 06/04/16  F. Convergent AF Ablation 11/22/16 - Procedure was complicated with pericardial effusion/ tamponade requiring pericardiocentesis. Developed left pleural effusion with thoracentesis.   G. Echo 12/03/16- Aortic valve is severely calcified with severely restricted systolic opening, mild regurgitation, LV is normal in size with moderate to severe concentric hypertrophy, systolic function is preserved with an EF 55-60%.  H. Echo 12/08/17- EF 60-65%. Moderate concentric LVH. LA and RA severely dilated. Mild MR and moderate TR. Mod aortic stenosis. Mild to moderate AR and pulmonic valvular regurgitation. Mildly dilated ascending aorta 3.8-4.2 cm.   I. Echo 01/29/19- Moderate to severe concentric LVH, EF 65-99%, grade 2 diastolic dysfunction of the LV (with evidence of increased LA pressure), RV size is moderately increased, RV hypertrophy, RV systolic function is mild to mod decreased, proximal ascending aorta is mildly dilated at 4.2cm. mod aortic stenosis, mild to mod insufficiency, mitral valve regurgitation is mild, tricuspid regurgitation is moderate, estimated pulm  arterial systolic pressure is severely elevated, 27mmHg, pulm valve regurgitation is mild to moderate.   J. Echo 06/21/19- LV cavity size is normal. There is a flattened septum in systole and diastole consistent \\w / RV pressure and volume overload. EF 65-70%. LV walls are upper normal in thickness. RV size is mildy increased. RV systolic function is mildly decreased. Mod aortic stenosis w/ peak aortic valve velocity 3.5 m/s and mean pressure gradient of 23 mmHg. Mild to mod aortic regurgitation. Mild mitral regurgitation. Mod tricuspid regurgitation. RVSP 93 mmHg.     2. Aorta status  A. 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 (Again there is small area of persistent perfusion of the aneurysm sac which appears to have diminished since prior exam and may be related to partial thrombosis versus technique). At subsequent follow-up, would encourage delayed evaluation in addition to  arterial phase evaluation. Maximal aneurysmal dilatation involving the descending segment measures 45 x 39 mm.     3. Hypertension.  4. Hyperlipidemia.   5. Type I Diabetes Mellitus.  6. Bipolar disorder.  7. Fatty liver.  8. Renal insufficiency.  A. Normal renal ultrasound October 23, 2011.  9. Basilar Airway disease     LABS 07/23/19- NA 142, K 3.6, GLU 162, BUN 30, CREAT 1.4, ALB 4.0, GLOB 2.5, AST 12, ALT 10, GFR 51, A1C 6.2, WBC 5.6, RBC 4.09, HGB 8.9, HCT 32.3, PLT 262, CHOL 80, TRIG 61, HDL 30, LDL 38    He has a history of atrial fibrillation, hypertension, hyperlipidemia, and diabetes. S/p ablation in 2016. S/p convergent AF ablation in 2017. His echo from 06/21/19 demonstrated EF of 65-70% with a flattened septum in systole and diastole consistent with RV pressure and volume overload, moderate aortic stenosis, mild to moderate aortic regurgitation, mild mitral regurgitation, and moderate tricuspid regurgitation.    Family History     Family History    Problem Relation Age of Onset    Hypertension Mother     Diabetes Mother     Heart disease Mother     Hypertension Sister     Diabetes Sister     Hyperlipidemia Sister     Hypertension Brother     Heart disease Brother     Hyperlipidemia Brother     Hypertension Brother     Diabetes Brother     Hyperlipidemia Brother     Kidney disease Brother     Hypertension Daughter        Social History     Social History     Tobacco Use    Smoking status: Former Smoker     Packs/day: 0.30     Years: 40.00     Pack years: 12.00     Types: Cigarettes     Quit date: 1995     Years since quitting: 26.0    Smokeless tobacco: Never Used   Substance Use Topics    Alcohol use: No    Drug use: No     Medications     Current Outpatient Medications:     acetaminophen (TYLENOL) 500 MG tablet, Take 500 mg by mouth every 6 (six) hours as needed for Pain., Disp: , Rfl:     allopurinol (ZYLOPRIM) 300 MG tablet, Take 300 mg by mouth daily., Disp: , Rfl:     amiodarone (PACERONE) 200 MG tablet, Take 1 tablet (200 mg total) by mouth daily, Disp: 90 tablet, Rfl: 1    atenolol (TENORMIN) 100 MG tablet, Take 1 tablet (100 mg total) by mouth daily, Disp: 90 tablet, Rfl: 3    divalproex EC/DR tablet (DEPAKOTE EC/DR) 250 MG EC tablet, Take 500 mg by mouth 2 (two) times daily  , Disp: , Rfl:     empagliflozin (Jardiance) 10 MG tablet, Take 1 tablet (10 mg total) by mouth every morning, Disp:  , Rfl:     ferrous sulfate 325 (65 FE) MG tablet, Take 325 mg by mouth every morning with breakfast, Disp: , Rfl:     gabapentin (NEURONTIN) 100 MG capsule, Take 100 mg by mouth 2 (two) times daily., Disp: , Rfl:     isosorbide mononitrate (IMDUR) 60 MG 24 hr tablet, Take 1 tablet (60 mg total) by mouth daily, Disp: 30 tablet, Rfl: 11    Loperamide HCl (IMODIUM PO), Take by mouth daily as needed., Disp: , Rfl:  Multiple Vitamin (MULTIVITAMIN) capsule, Take 1 capsule by mouth daily., Disp: , Rfl:     pravastatin (PRAVACHOL) 40 MG  tablet, Take 40 mg by mouth every evening., Disp: , Rfl:     rivaroxaban (Xarelto) 15 MG Tab, Take 1 tablet (15 mg total) by mouth daily with dinner, Disp: 30 tablet, Rfl: 11    SENNA CO, by Combination route daily as needed., Disp: , Rfl:     tamsulosin (FLOMAX) 0.4 MG Cap, Take 0.4 mg by mouth daily., Disp: , Rfl:     torsemide (DEMADEX) 20 MG tablet, Take 20 mg by mouth daily, Disp: , Rfl:   Visit Vitals      Vitals:    08/29/19 0909   BP: (!) 184/95   Pulse: (!) 55   Weight: 69.9 kg (154 lb 3.2 oz)   Height: 1.702 m (5\' 7" )     Wt Readings from Last 3 Encounters:   08/29/19 69.9 kg (154 lb 3.2 oz)   08/02/19 71.2 kg (157 lb)   07/12/19 75.3 kg (166 lb)       Labs     Lab Results   Component Value Date/Time    WBC 13.3 (H) 12/09/2016 07:10 AM    RBC 3.14 (L) 12/09/2016 07:10 AM    HGB 8.9 (L) 12/09/2016 07:10 AM    HCT 29.2 (L) 12/09/2016 07:10 AM    PLT 621 (H) 12/09/2016 07:10 AM    TSH 4.57 (H) 06/14/2019 11:56 AM     Lab Results   Component Value Date/Time    NA 143 07/04/2019 07:36 AM    K 4.6 07/04/2019 07:36 AM    CL 107 07/04/2019 07:36 AM    CO2 26 07/04/2019 07:36 AM    GLU 129 (H) 07/04/2019 07:36 AM    BUN 52 (H) 07/04/2019 07:36 AM    CREAT 1.70 (H) 07/12/2019 09:01 AM    CREAT 2.16 (H) 07/04/2019 07:36 AM    PROT 7.2 06/20/2019 01:38 PM    ALKPHOS 140 06/20/2019 01:38 PM    AST 18 06/20/2019 01:38 PM    ALT 11 06/20/2019 01:38 PM     Lab Results   Component Value Date/Time    CHOL 70 (L) 11/26/2016 03:02 AM    TRIG 78 11/26/2016 03:02 AM    HDL 21 (L) 11/26/2016 03:02 AM    LDL 33 11/26/2016 03:02 AM     Lab Results   Component Value Date/Time    HGBA1CPERCNT 6.5 11/26/2016 03:02 AM     Lab Results   Component Value Date/Time    BNP 1,041.5 (H) 11/15/2016 11:10 AM          Respectfully,   [name], DO  08/29/2019  Electronically signed by:  [name], DO    This note was scribed by Algie Coffer on behalf of [name], DO

## 2019-08-30 ENCOUNTER — Ambulatory Visit
Admission: RE | Admit: 2019-08-30 | Discharge: 2019-08-30 | Disposition: A | Discharge status: Home / Self Care | Payer: No Typology Code available for payment source | Source: Ambulatory Visit | Attending: Family Nurse Practitioner | Admitting: Family Nurse Practitioner

## 2019-08-30 ENCOUNTER — Ambulatory Visit (INDEPENDENT_AMBULATORY_CARE_PROVIDER_SITE_OTHER): Payer: No Typology Code available for payment source | Attending: Family

## 2019-08-30 ENCOUNTER — Encounter (INDEPENDENT_AMBULATORY_CARE_PROVIDER_SITE_OTHER): Payer: Self-pay | Admitting: Family

## 2019-08-30 ENCOUNTER — Ambulatory Visit (INDEPENDENT_AMBULATORY_CARE_PROVIDER_SITE_OTHER): Payer: No Typology Code available for payment source | Admitting: Family

## 2019-08-30 ENCOUNTER — Encounter: Payer: Self-pay | Admitting: Family Nurse Practitioner

## 2019-08-30 VITALS — BP 159/96 | HR 61 | Temp 98.7°F | Resp 16 | Ht 67.0 in | Wt 155.0 lb

## 2019-08-30 VITALS — BP 185/89 | HR 58 | Resp 18 | Wt 156.8 lb

## 2019-08-30 DIAGNOSIS — I10 Essential (primary) hypertension: Secondary | ICD-10-CM

## 2019-08-30 DIAGNOSIS — M79642 Pain in left hand: Secondary | ICD-10-CM

## 2019-08-30 DIAGNOSIS — I4819 Other persistent atrial fibrillation: Secondary | ICD-10-CM

## 2019-08-30 DIAGNOSIS — S6992XA Unspecified injury of left wrist, hand and finger(s), initial encounter: Secondary | ICD-10-CM

## 2019-08-30 DIAGNOSIS — N189 Chronic kidney disease, unspecified: Secondary | ICD-10-CM

## 2019-08-30 DIAGNOSIS — I5032 Chronic diastolic (congestive) heart failure: Secondary | ICD-10-CM

## 2019-08-30 DIAGNOSIS — Z7901 Long term (current) use of anticoagulants: Secondary | ICD-10-CM

## 2019-08-30 MED ORDER — HYDRALAZINE HCL 25 MG PO TABS
25.0000 mg | ORAL_TABLET | Freq: Two times a day (BID) | ORAL | 11 refills | Status: DC
Start: 2019-08-30 — End: 2019-09-13

## 2019-08-30 NOTE — Patient Instructions (Addendum)
Heart failure causes many symptoms.   Some of them are more serious than others.   It is important to recognize when you should call 911 for emergency help and when you should call your doctor or nurse for urgent attention.    The following lists can help you decide what to do when you experience certain symptoms. You may want to print this page and keep it in a convenient spot.    Emergency Symptoms of Heart Failure  Call 911 for emergency help, if you have:    -Chest discomfort or pain that lasts more than 15 minutes that is not relieved with rest or nitroglycerin.  -Severe, persistent shortness of breath.  -Fainted or passed out.    Urgent Symptoms of Heart Failure  Call your doctor immediately, if you have any of the following symptoms:    -Increasing shortness of breath or a new shortness of breath while resting.  -Trouble sleeping due to difficulty breathing. For example, waking up suddenly at night due to difficulty breathing.  -A need to sleep sitting up or on more pillows than usual.  -Fast or irregular heart beats, palpitations, or a "racing heart" that persists and makes you feel dizzy or lightheaded.    Or if you:  -Cough up frothy or pink sputum.  -Feel like you may faint or pass out.    To manage your heart failure, please follow the following recommendations:    Fluid restriction:  You should follow a daily fluid restriction not to exceed 2 Liters daily (equivalent to 64 fluid ounces). This includes all things liquid at room temperature such as: all beverages, soup, ice cream, popsicles, jello and ice (you will need to guess how much the ice would be when melted and add toward your daily total). Keep track of your fluid intake throughout the day and measure everything.     Daily weights:  Weigh yourself every day when you wake up, right after using the bathroom. Keep a record of your daily weights and call our office if you gain more than 2-3 pounds in 1 day or 5 pounds in less than a week.     Low  sodium diet:  Sodium can lead to fluid retention and patients with heart failure should follow the low sodium diet, do not exceed 2000 mg sodium daily. Do not cook with salt or add it at the table. This includes table salt, sea salt, Kosher salt, garlic salt, seasoned salt or other seasoning's with salt (read the nutrition label). Avoid eating out at restaurants and high sodium foods such as processed meats, pre-packaged meals, pickles, soy sauce, chips, canned soups and bacon. Ask your healthcare provider for more information.    Activity:  We encourage you to engage in regular physical activity, such as daily walking. Start with short distances and walk at a comfortable pace. You should be able to "walk and talk". Gradually increase the distance and time that you walk. Exercise will help you feel better and can relieve the symptom of "heavy, tired legs". Stop exercise if you have chest pain, dizziness, or get so short of breath that you can not talk.   You should not lift/push/or pull heavy objects (anything heavy enough to make you strain).     Medications:  Take all your medications as prescribed by your health care provider and bring all your medication bottles to every appointment. It is important that you know what you are taking, when, and for what reason.      Medications you should not take:  Nonsteroidal anti-inflammatory drugs (NSAIDS): such as ibuprofen, Advil, Motrin, naprosyn, Naproxen, may cause fluid retention. You should not take these medications.    Call your healthcare provider if you are not feeling well or have new problems. We will call you back within 24 hours (Monday-Friday). In case of an emergency, call 911 or go to the nearest emergency department.      Thank you for choosing the [place] [facility] with [facility] for your care. It is important to keep all your medical appointments, even if you feel well. To reschedule or cancel an appointment  with our clinic, call [phone]. If you need to leave a message on the nurse line for the heart failure clinic, please call [phone].      Start hydralazine 25 mg 1 tablet in am and 1 tablet in pm.  Continue to monitor blood pressure at home.  We will talk by phone in two weeks to review your blood pressures.    You need to see an urgent care to have your left hand evaluated due to your fall.  If you fall at home and hit your head, you should be seen by an urgent care.

## 2019-08-30 NOTE — Patient Instructions (Signed)
RICE    RICE stands for rest, ice, compression, and elevation. Doing these things helps limit pain and swelling after an injury. RICE also helps injuries heal faster. Use RICE for sprains, strains, and severe bruises or bumps. Follow the tips on this handout and begin RICE as soon as possible after an injury.   Rest  Pain is your body's way of telling you to rest an injured area. Whether you have hurt an elbow, hand, foot, or knee, limiting its use will prevent further injury and help you heal.   Ice  Applying ice right after an injury helps prevent swelling and reduce pain. Don't place ice directly on your skin.    Wrap a cold pack or bag of ice in a thin cloth. Place it over the injured area.   Ice for 10minutes every 3hours. Don't ice for more than 20minutes at a time.  Compression  Putting pressure (compression) on an injury helps prevent swelling and provides support.   Wrap the injured area firmly with an elastic bandage. If your hand or foot tingles, becomes discolored, or feels cold to the touch, the bandage may be too tight. Rewrap it more loosely.   If your bandage becomes too loose, rewrap it.   Do not wear an elastic bandage overnight.  Elevation  Keeping an injury elevated helps reduce swelling, pain, and throbbing. Elevation is most effectivewhen the injury is kept elevated higher than the heart.   Call your healthcare provider if you notice any of the following:   Fingers or toes feel numb, are cold to the touch, or change color.   Skin looks shiny or tight.   Pain, swelling, or bruising worsens and is not improved with elevation.  [employer] last reviewed this educational content on 12/07/2016   2000-2020 [employer] [address], [place] [address]. All rights reserved. This information is not intended as a substitute for professional medical care. Always follow your healthcare professional's instructions.

## 2019-08-30 NOTE — Progress Notes (Signed)
Subjective:    Patient ID: [name] is a 69 y.o. male.    Patient w/ cloth mask. 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    Fell last Thursday while walking at home. He told Dr. [name] about this yesterday and he said it was foot drop. Pt has had a stroke in the past (about 2 and a half years ago). He says he has right sided weakness that comes and goes. He is scheduled to have a CT scan-waiting for a call for appt time. Pt says that [name] ( a PA at the heart failure clinic) suggested that the pt come to [facility] today for an xray of the right hand. Pt is a retired Marine scientist, he doesn't think it's broken.    Fall  The fall occurred while walking. Distance fallen: ground level. He landed on hard floor. The point of impact was the face and left wrist. The pain is present in the left hand. Pertinent negatives include no abdominal pain, fever, headaches, nausea or vomiting. He has tried ice, heat and acetaminophen for the symptoms. The treatment provided mild relief.       The following portions of the patient's history were reviewed and updated as appropriate: allergies, current medications, past family history, past medical history, past social history, past surgical history and problem list.    Review of Systems   Constitutional: Negative for fever.   HENT: Negative for sore throat.    Eyes: Negative for pain, discharge and itching.   Respiratory: Negative for chest tightness and shortness of breath.    Cardiovascular: Negative for chest pain.   Gastrointestinal: Negative for abdominal pain, constipation, diarrhea, nausea and vomiting.   Genitourinary: Negative for difficulty urinating.   Musculoskeletal: Positive for arthralgias (l hand). Negative for back pain.   Skin: Positive for color change (bruising).   Allergic/Immunologic: Positive for food allergies (liver-N/V). Negative for environmental allergies.   Neurological: Negative for dizziness, seizures and headaches.   Psychiatric/Behavioral:  Negative for confusion.     Allergies   Allergen Reactions    Codeine Nausea And Vomiting       Past Medical History:   Diagnosis Date    Arrhythmia     Arthritis     Atrial fibrillation     Atrial flutter     Bipolar affective     BPH (benign prostatic hyperplasia)     Complication of anesthesia     resp. asessment    Diabetes mellitus     Diabetic neuropathy     Gout     Heart murmur     HOH (hard of hearing)     Hyperlipidemia     Hypertension     Paroxysmal atrial fibrillation     Pulmonary hypertension     Renal insufficiency     Type 2 diabetes mellitus, controlled     Wears glasses        Past Surgical History:   Procedure Laterality Date    APPENDECTOMY      CARDIAC ABLATION      CARDIAC CATHETERIZATION      CARDIOVERSION      x 2    FLUORO-NO CHARGE (HYBRID ROOM ONLY) N/A 11/22/2016    Procedure: FLUORO-NO CHARGE (HYBRID ROOM ONLY);  Surgeon: [name], MD;  Location: [facility] CATH/EP;  Service: Cardiovascular;  Laterality: N/A;    HERNIA REPAIR      PARACENTESIS  2011    PARACENTESIS  06/22/2019    PERICARDIOCENTESIS  Left 11/25/2016    Procedure: PERICARDIOCENTESIS;  Surgeon: [name], MD;  Location: [facility] C[facility];  Service: Cardiovascular;  Laterality: Left;    TEE      TONSILLECTOMY      TONSILLECTOMY, ADENOIDECTOMY         Family History   Problem Relation Age of Onset    Hypertension Mother     Diabetes Mother     Heart disease Mother     Hypertension Sister     Diabetes Sister     Hyperlipidemia Sister     Hypertension Brother     Heart disease Brother     Hyperlipidemia Brother     Hypertension Brother     Diabetes Brother     Hyperlipidemia Brother     Kidney disease Brother     Hypertension Daughter            Objective:    BP (!) 159/96    Pulse 61    Temp 98.7 F (37.1 C) (Tympanic)    Resp 16    Ht 1.702 m (5\' 7" )    Wt 70.3 kg (155 lb)    BMI 24.28 kg/m     Physical Exam  Vitals signs and nursing note reviewed.   Constitutional:        General: He is not in acute distress.     Appearance: He is well-developed. He is not ill-appearing, toxic-appearing or diaphoretic.   HENT:      Head: Normocephalic and atraumatic.      Right Ear: Tympanic membrane normal.      Left Ear: There is impacted cerumen.      Nose: Nose normal.   Eyes:      General:         Right eye: No discharge.         Left eye: No discharge.      Pupils: Pupils are equal, round, and reactive to light.   Cardiovascular:      Rate and Rhythm: Normal rate. Rhythm irregular.      Pulses:           Radial pulses are 2+ on the left side.      Comments: PMH Afib-takes xerelto  Pulmonary:      Effort: Pulmonary effort is normal. No respiratory distress.   Musculoskeletal:        Arms:       Left hand: He exhibits decreased range of motion, tenderness and swelling. He exhibits normal capillary refill.      Comments: Bruising to LH, all fingers and wrist. He has good ROM of wrist but limited, painful ROM of all fingers/thumb.    Skin:     General: Skin is warm and dry.      Findings: No rash.   Neurological:      Mental Status: He is alert and oriented to person, place, and time.   Psychiatric:         Mood and Affect: Mood normal.         Behavior: Behavior normal.      Comments: Pleasant and talkative     Lab Results from today's visit:  No results found for this or any previous visit (from the past 12 hour(s)).    Radiology Results from today's visit:  Xr Hand Left Pa Lateral And Oblique    Result Date: 08/30/2019  No acute fracture or dislocation identified. Unremarkable joint spaces. Carpal rows maintained. Thin linear 2 mm radiopaque density  of the thumb soft tissues could be small foreign body. ReadingStation:[id number]          Assessment and Plan:       Leelyn was seen today for fall.    Diagnoses and all orders for this visit:    Hand injury, left, initial encounter  -     XR Hand Left PA Lateral And Oblique; Future      -L hand xray: negative fracture.     Extensive bruising likely r/t  xarelto    -Rest.  -Ice for 20 minutes on and 20 minutes off, be sure to protect the skin  -Compression. I placed ACE wrap in the office today. Use for comfort.  --Discussed monitoring neurovascular status.  -Elevation.  -Tylenol as needed for pain.  -Phone numbers provided for referral to orthopedic.    Go to the ER for any new or worsening symptoms that concern you.  Follow-up with your primary care doctor or return to Urgent Care if your symptoms do not improve.    Patient agrees with the plan.          Houston Siren, NP  [facility]  08/30/2019  11:28 AM

## 2019-08-30 NOTE — Progress Notes (Signed)
[place]      Advanced Heart Failure Established Visit    [name]   Date of Birth: [birth year]    Provider: Belva Agee, NP     Patient Care Team:  [name], MD as PCP - General (Family Medicine)  [name], DO as Consulting Physician (Cardiology)  [name], MD as Consulting Physician (Thoracic and Cardiac Surgery)    Chief Complaint:      Follow up     HPI:  I had the pleasure of seeing [name] today at [facility] for an advanced heart failure new appointment for hospital follow up.      Blood pressures at home have been elevated. Patient states he spoke with Dr. [name] yesterday and reviewed his falls at home. Dr. [name] advised patient that it sounds like his falls were related to dropped foot.  The mri that was previously requested by neurology was reordered.  It has not been scheduled as of yet.      Mr. Prusinski has been closely following his blood pressure at home. He is reporting his blood pressures before medications typically 185/94 and after medicines 151/81. He reviewed his blood pressures with Dr. [name] but his medications were not changed per patient. Note is not completed.      He had a fall at home and did strike his head and has swelling in his left hand. He has not been seen for this issue.  He denies any dizziness.  Denies any loss of consciousness.  He did hit his head and denies any blurred vision or headaches.     [name] is a 69 y.o. male with HFpEF, NYHA Class 3, ACC/AHA.  Also has a history of paroxysmal atrial fibrillation status post ablation, diabetes, chronic kidney disease stage III-IV, hypertension hyperlipidemia bipolar disorder. From a cardiovascular standpoint, [name] states has not done well.      He denies any fever, chills, night sweats, lightheadedness, dizziness, presyncope or syncope, shortness of breath at rest,  orthopnea, PND, increasing fatigue or decreasing exercise tolerance. He has had no chest pain or pressure.  His appetite has been stable and he denies any abdominal bloating or distention, early satiety, anorexia, nausea, vomiting or changes in bowel or bladder habits.     [name] reports that he has not been exercising.  He does check blood pressures at home.   He has been checking morning weights; his weight has been stable and slightly declined since discharge.  [name] has been following a fluid restriction of 2 L/day and has restricting dietary sodium content to no more than 2 gm/day.  He reports that he has been compliant with his medications.      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    Chronic anticoagulation 11/25/2016    Stroke 11/25/2016    CKD (  chronic kidney disease) 11/25/2016    Former smoker, stopped smoking many years ago 11/25/2016    Type 2 diabetes mellitus 11/25/2016    HTN (hypertension) 11/25/2016    HLD (hyperlipidemia) 11/25/2016    Diabetic neuropathy 11/25/2016    COPD (chronic obstructive pulmonary disease) 11/25/2016    Bipolar disorder 11/25/2016    BPH (benign prostatic hyperplasia) 11/25/2016    Atrial fibrillation 11/22/2016    Chronic atrial fibrillation 10/08/2016    Stage 4 chronic kidney disease 10/08/2016    Persistent atrial fibrillation 10/07/2016    Fatty liver 10/07/2016     Past Medical History:  1. Atrial fibrillation  A. Cardioversion 10/22/11.   B. Echo 10/22/11- moderate-severe concentric LVH. Biatrial  enlargement. Mild AS, dilated inferior vena cava, small pericardial effusion. Peak velocity across the AV at 2.2 meters/s.  C. Cardiac abltation 01/13/15.   D. MRI Cardiac 03/16/16- LVEF 68.4%. Moderate septal wall hypertrophy (1.6cm) and mild posterior wall hypertrophy (1.3cm). Focal area of mid-myocardial wall delayed enhancement in basal anteroseptal wall and RV insertion point consistent with hypertensive heart disease. RVEF 41.7%. Severe LA enlargement and moderate RA enlargement. 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 Mild AS with peak velocity 2.65m/s. Mild central AR. Mild MR and TR. Small to moderate pericardial effusion. Mildly dilated ascending aorta. Aortic arch and descending thoracic aorta are ectatic. Trivial right pleural effusion. Dilated central pulm artery at 4.3cm.   E. s/p TEE and [place] cardioversion 06/04/16  F. Convergent AF Ablation 11/22/16 - Procedure was complicated with pericardial effusion/ tamponade requiring pericardiocentesis. He developed left pleural effusion with thoracentesis.   G. Echo 12/03/16- No pericardial effusion, aortic valve is severely calcified with severely restricted systolic opening, mild regurgitation, LV is normal in size with moderate to severe concentric hypertrophy, systolic function is preserved with an EF 62-22%, diastolic dysfunction that couldn't be graded.   H. Echo 12/08/17- EF 60-65%. Moderate concentric LV hypertrophy. LA and RA severely dilated. Mild MR and moderate TR. Moderate valvular aortic stenosis. Mild to moderate AR and pulmonic valvular regurgitation. Mildly dilated ascending aorta 3.8-4.2 cm.   I. Echo 01/29/19- Moderate to severe concentric LVH, EF 97-98%, grade 2 diastolic dysfunction of the LV (with evidence of increased left atrial pressure), RV size is moderately increased, RV hypertrophy, RV systolic function is mild to moderately decreased, proximal ascending aorta is mildly dilated at 4.2cm.  moderate aortic stenosis, mild to moderate insufficiency, mitral valve regurgitation is mild, tricuspid regurgitation is moderate, estimated pulm arterial systolic pressure is severely elevated, 15mmHg, pulm valve regurgitation is mild to moderate.   J. Echo 06/21/2019  ef 65-70%. right ventricular size is mildy increased. The right ventricular systolic function is mildly decreased. Moderate aortic stenosis with peak aortic valve velocity 3.5 m/s and mean pressure gradient of 23 mmHg. Mild to moderate aortic regurgitation. Mild mitral regurgitation.  Moderate tricuspid regurgitation.  Estimated RVSP 93 mmHg.     2. Aorta status  A. 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 (Again there is small area of persistent perfusion of the aneurysm sac which appears to have diminished since prior exam and may be related to partial thrombosis versus technique). At subsequent follow-up, would encourage delayed evaluation in addition to arterial phase evaluation. Maximal aneurysmal dilatation involving the descending segment measures 45 x 39 mm.     3. Hypertension.  4. Hyperlipidemia.   5.  Type I Diabetes Mellitus.  6. Bipolar disorder.  7. Fatty liver.  8. Renal insufficiency.  A. Normal renal ultrasound October 23, 2011.  9. Basilar Airway disease  Past Medical History:   Diagnosis Date    Arrhythmia     Arthritis     Atrial fibrillation     Atrial flutter     Bipolar affective     BPH (benign prostatic hyperplasia)     Complication of anesthesia     resp. asessment    Diabetes mellitus     Diabetic neuropathy     Gout     Heart murmur     HOH (hard of hearing)     Hyperlipidemia     Hypertension     Paroxysmal atrial fibrillation     Pulmonary hypertension     Renal insufficiency     Type 2 diabetes mellitus, controlled     Wears glasses      Past Surgical History:   has a past surgical history that  includes Appendectomy; Hernia repair; TONSILLECTOMY, ADENOIDECTOMY; CARDIAC ABLATION; Cardioversion; TEE; Cardiac catheterization; Tonsillectomy; Pericardiocentesis (Left, 11/25/2016); FLUORO-NO CHARGE (N/A, 11/22/2016); Paracentesis (2011); and Paracentesis (06/22/2019).  Family History:  family history includes Diabetes in his brother, mother, and sister; Heart disease in his brother and mother; Hyperlipidemia in his brother, brother, and sister; Hypertension in his brother, brother, daughter, mother, and sister; Kidney disease in his brother.  Social History:   reports that he quit smoking about 26 years ago. His smoking use included cigarettes. He has a 12.00 pack-year smoking history. He has never used smokeless tobacco. He reports that he does not drink alcohol or use drugs.     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  Medications:    Current Outpatient Medications:     acetaminophen (TYLENOL) 500 MG tablet, Take 500 mg by mouth every 6 (six) hours as needed for Pain., Disp: , Rfl:     allopurinol (ZYLOPRIM) 300 MG tablet, Take 300 mg by mouth daily., Disp: , Rfl:     amiodarone (PACERONE) 200 MG tablet, Take 1 tablet (200 mg total) by mouth daily, Disp: 90 tablet, Rfl: 1    atenolol (TENORMIN) 100 MG tablet, Take 100 mg by mouth 2 (two) times daily, Disp: , Rfl:     divalproex EC/DR tablet (DEPAKOTE EC/DR) 250 MG EC tablet, Take 500 mg by mouth 2 (two) times daily  , Disp: , Rfl:     empagliflozin (Jardiance) 10 MG tablet, Take 1 tablet (10 mg total) by mouth every morning, Disp:  , Rfl:     ferrous sulfate 325 (65 FE) MG tablet, Take 325 mg by mouth every morning with breakfast, Disp: , Rfl:     gabapentin (NEURONTIN) 100 MG capsule, Take 100 mg by mouth 2 (two) times daily., Disp: , Rfl:     isosorbide mononitrate (IMDUR) 60 MG 24 hr  tablet, Take 1 tablet (60 mg total) by mouth daily, Disp: 30 tablet, Rfl: 11    Loperamide HCl (IMODIUM PO), Take by mouth daily as needed., Disp: , Rfl:     Multiple Vitamin (MULTIVITAMIN) capsule, Take 1 capsule by mouth daily., Disp: , Rfl:     pravastatin (PRAVACHOL) 40 MG tablet, Take 40 mg by mouth every evening., Disp: , Rfl:  rivaroxaban (Xarelto) 15 MG Tab, Take 1 tablet (15 mg total) by mouth daily with dinner, Disp: 30 tablet, Rfl: 11    SENNA CO, by Combination route daily as needed., Disp: , Rfl:     tamsulosin (FLOMAX) 0.4 MG Cap, Take 0.4 mg by mouth daily., Disp: , Rfl:     torsemide (DEMADEX) 20 MG tablet, Take 20 mg by mouth daily, Disp: , Rfl:     Vitals:    Visit Vitals  BP 185/89 (BP Site: Left arm)   Pulse (!) 58   Resp 18   Wt 71.1 kg (156 lb 12.8 oz)   SpO2 93%   BMI 24.56 kg/m     Weight is stable.     Physical Exam:   General: well-developed male, alert, NAD  HEENT:  Anicteric sclera, supple  Lungs: CTA B/L, no rhonchi, wheezes or crackles with normal rate and effort  Cardiovascular: s1s2, normal rate, regular rhythm, ii/vi sem Abdominal: obese non tender.   Extremities: warm to touch, trace edema, ecchymosis of left hand with edema.  Neuromuscular exam: grossly non-focal, though not formally tested.    Laboratory Data:  Lab Results   Component Value Date/Time    WBC 13.3 (H) 12/09/2016 07:10 AM    RBC 3.14 (L) 12/09/2016 07:10 AM    HGB 8.9 (L) 12/09/2016 07:10 AM    HCT 29.2 (L) 12/09/2016 07:10 AM    PLT 621 (H) 12/09/2016 07:10 AM    TSH 4.57 (H) 06/14/2019 11:56 AM     Lab Results   Component Value Date/Time    NA 143 07/04/2019 07:36 AM    K 4.6 07/04/2019 07:36 AM    CL 107 07/04/2019 07:36 AM    CO2 26 07/04/2019 07:36 AM    GLU 129 (H) 07/04/2019 07:36 AM    BUN 52 (H) 07/04/2019 07:36 AM    CREAT 1.70 (H) 07/12/2019 09:01 AM    CREAT 2.16 (H) 07/04/2019 07:36 AM    PROT 7.2 06/20/2019 01:38 PM    ALKPHOS 140 06/20/2019 01:38 PM    AST 18 06/20/2019 01:38 PM    ALT 11  06/20/2019 01:38 PM     Lab Results   Component Value Date/Time    CHOL 70 (L) 11/26/2016 03:02 AM    TRIG 78 11/26/2016 03:02 AM    HDL 21 (L) 11/26/2016 03:02 AM    LDL 33 11/26/2016 03:02 AM     Lab Results   Component Value Date/Time    HGBA1CPERCNT 6.5 11/26/2016 03:02 AM     Lab Results   Component Value Date/Time    BNP 1,041.5 (H) 11/15/2016 11:10 AM     07/12/2019  Sodium 139  Potassium 4.0  co2 30  Glucose 116  Bun 37  crt 1.7    Assessment and Plan:  1. Chronic diastolic heart failure     2. Persistent atrial fibrillation     3. Chronic anticoagulation     4. Chronic kidney disease, unspecified CKD stage     5. Essential hypertension           My impression is that, Mr. Broyles has HFpEF.  Appears to be doing well on his current regimen of diuretics.   Recommend continuing low-dose torsemide.     Blood pressure continues to be moderately elevated at home.  I recommend adding hydralazine 25 mg twice a day.  He does not appear to be having syncope or presyncope or dizziness.  He appears to be falling  due to an unsteady gait.    Recommend patient to be evaluated at urgent care for his hand.  He prefers to go to a place in [place].  I have called the [facility] urgent care and verify that they do have x-ray capability.  I have provided him the contact information for that facility.  I recommended that he go there today.    We will talk by phone in 2 weeks.     Counseling:  -I have encouraged pt to continue to follow a <2gm/day Na restriction and limit fluid intake to < 2L/day.  -I have requested pt perform daily wts and contact our office for increase of >2-3lbs in 1 day or 5lbs in 1 week.   -I have encouraged pt to engage in aerobic activity as tolerated and avoid heavy weight lifting.   -Smoking/Alcohol: I have advised pt to abstain from any tobacco use and excessive alcohol intake.    -Weight: Obesity is a significant risk for both short and long-term morbidity and mortality for AHF  patients.  I have stressed the importance of weight control and dietary management.     Our office is available to answer any questions you or the patient may have. We appreciate the opportunity to participate in the care of your patients.      Electronically signed by:    Norval Gable. Abigail Butts, FNP-C  Clinical Coordinator for Advance Heart Failure and [facility]  [address]  [place], [place] [address]  [phone]   [phone] - portable phone  [phone] - fax         Note: This chart was generated by the [facility] EMR system/speech recognition and may contain inherit omission or errors not intended by the user.  Grammatical errors, random word insertions, deletions, pronoun errors and incomplete sentences are occasionally consequences of this technology due to software limitations.  Not all errors are caught or corrected.  If there are questions or concerns about the content of this note or information contained in the body of this dictation they should be addressed directly with the author for clarification.

## 2019-08-31 ENCOUNTER — Telehealth: Payer: Self-pay | Admitting: Internal Medicine

## 2019-09-13 ENCOUNTER — Ambulatory Visit
Admission: RE | Admit: 2019-09-13 | Discharge: 2019-09-13 | Disposition: A | Discharge status: Home / Self Care | Payer: No Typology Code available for payment source | Source: Ambulatory Visit | Attending: Family Nurse Practitioner | Admitting: Family Nurse Practitioner

## 2019-09-13 ENCOUNTER — Encounter: Payer: Self-pay | Admitting: Family Nurse Practitioner

## 2019-09-13 VITALS — BP 194/84 | HR 54 | Wt 148.8 lb

## 2019-09-13 DIAGNOSIS — I10 Essential (primary) hypertension: Secondary | ICD-10-CM

## 2019-09-13 DIAGNOSIS — N189 Chronic kidney disease, unspecified: Secondary | ICD-10-CM

## 2019-09-13 DIAGNOSIS — Z7901 Long term (current) use of anticoagulants: Secondary | ICD-10-CM

## 2019-09-13 DIAGNOSIS — I4819 Other persistent atrial fibrillation: Secondary | ICD-10-CM

## 2019-09-13 DIAGNOSIS — I5032 Chronic diastolic (congestive) heart failure: Secondary | ICD-10-CM

## 2019-09-13 MED ORDER — HYDRALAZINE HCL 25 MG PO TABS
50.0000 mg | ORAL_TABLET | Freq: Three times a day (TID) | ORAL | 11 refills | Status: DC
Start: 2019-09-13 — End: 2019-09-21

## 2019-09-13 NOTE — Patient Instructions (Addendum)
Increase hydralazine 50 mg 1 tablet three times per day.

## 2019-09-13 NOTE — Progress Notes (Signed)
[place]      Advanced Heart Failure Established Visit    [name]   Date of Birth: February 17, 1951    Provider: Belva Agee, NP     Patient Care Team:  [name], MD as PCP - General (Family Medicine)  [name], DO as Consulting Physician (Cardiology)  [name], MD as Consulting Physician (Thoracic and Cardiac Surgery)    Chief Complaint:      Follow up     HPI:  I had the pleasure of speaking to [name] today from  [facility] for an advanced heart failure new appointment for hospital follow up.      This is a telehealth visit which was conducted with the use of interactive HIPPA compliant audio only, video option unavailable to patient telecommunication that permitted real time communication between [name] and myself. He consented to participation and received services at home, while I was located at the [facility] at [facility]. This visit was changed from an in-person visit to a telehealth visit to lower the risk of exposure and / or spread of the current pandemic with the SARS CoV-2 virus. This is based on the guidelines from the [facility] and other health agencies.    Blood pressures at home continues to be elevated. He is checking his blood pressure before medications.  His reading was 194/84 hr 54. After medications on other days his blood pressure is 15/184 and 164/90.  Heart rate has been 64 on both occasions. He notes he is feeling fine. Is now taking hydralazine 25 mg bid. Denies any symptoms.  He is reporting stable weights.     He did have his hand evaluated.  He notes there were no fractures. He continues to have pain in his hand.  Bruising is improving. He feels it will get better over time.     [name] is a 69 y.o. male with HFpEF, NYHA Class 3, ACC/AHA.  Also has a history of paroxysmal atrial fibrillation  status post ablation, diabetes, chronic kidney disease stage III-IV, hypertension hyperlipidemia bipolar disorder. From a cardiovascular standpoint, [name] states has not done well.      He denies any fever, chills, night sweats, lightheadedness, dizziness, presyncope or syncope, shortness of breath at rest, orthopnea, PND, increasing fatigue or decreasing exercise tolerance. He has had no chest pain or pressure.  His appetite has been stable and he denies any abdominal bloating or distention, early satiety, anorexia, nausea, vomiting or changes in bowel or bladder habits.     [name] reports that he has not been exercising.  He does check blood pressures at home.   He has been checking morning weights; his weight has been stable and slightly declined since discharge.  [name] has been following a fluid restriction of 2 L/day and has restricting dietary sodium content to no more than 2 gm/day.  He reports that he has been compliant with his medications.      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    Chronic anticoagulation 11/25/2016    Stroke 11/25/2016    CKD (chronic kidney disease) 11/25/2016    Former smoker, stopped smoking many years ago 11/25/2016    Type 2 diabetes mellitus 11/25/2016    HTN (hypertension) 11/25/2016    HLD (hyperlipidemia) 11/25/2016    Diabetic neuropathy 11/25/2016    COPD (chronic obstructive pulmonary disease) 11/25/2016    Bipolar disorder 11/25/2016    BPH (benign prostatic hyperplasia) 11/25/2016    Atrial fibrillation  11/22/2016    Chronic atrial fibrillation 10/08/2016    Stage 4 chronic kidney disease 10/08/2016    Persistent atrial fibrillation 10/07/2016    Fatty liver 10/07/2016     Past Medical History:  1. Atrial fibrillation  A. Cardioversion 10/22/11.   B. Echo 10/22/11- moderate-severe concentric LVH. Biatrial enlargement. Mild AS, dilated inferior vena cava, small pericardial effusion. Peak velocity across the AV at 2.2 meters/s.  C. Cardiac abltation 01/13/15.   D. MRI Cardiac 03/16/16- LVEF 68.4%. Moderate septal wall hypertrophy (1.6cm) and mild posterior wall hypertrophy (1.3cm). Focal area of mid-myocardial wall delayed enhancement in basal anteroseptal wall and RV insertion point consistent with hypertensive heart disease. RVEF 41.7%. Severe LA enlargement and moderate RA enlargement. 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 Mild AS with peak velocity 2.48m/s. Mild central AR. Mild MR and TR. Small to moderate pericardial effusion. Mildly dilated ascending aorta. Aortic arch and descending thoracic aorta are ectatic. Trivial right pleural effusion. Dilated central pulm artery at 4.3cm.   E. s/p TEE and [place] cardioversion 06/04/16  F. Convergent AF Ablation 11/22/16 - Procedure was complicated with pericardial effusion/ tamponade requiring pericardiocentesis. He developed left pleural effusion with thoracentesis.   G. Echo 12/03/16- No pericardial effusion, aortic valve is severely calcified with severely restricted systolic opening, mild regurgitation, LV is normal in size with moderate to severe concentric hypertrophy, systolic function is preserved with an EF 19-37%, diastolic dysfunction that couldn't be graded.   H. Echo 12/08/17- EF 60-65%. Moderate concentric LV hypertrophy. LA and RA severely dilated. Mild MR and moderate TR. Moderate valvular aortic stenosis. Mild to moderate AR and pulmonic valvular regurgitation. Mildly dilated ascending aorta 3.8-4.2 cm.    I. Echo 01/29/19- Moderate to severe concentric LVH, EF 90-24%, grade 2 diastolic dysfunction of the LV (with evidence of increased left atrial pressure), RV size is moderately increased, RV hypertrophy, RV systolic function is mild to moderately decreased, proximal ascending aorta is mildly dilated at 4.2cm. moderate aortic stenosis, mild to moderate insufficiency, mitral valve regurgitation is mild, tricuspid regurgitation is moderate, estimated pulm arterial systolic pressure is severely elevated, 47mmHg, pulm valve regurgitation is mild to moderate.   J. Echo 06/21/2019  ef 65-70%. right ventricular size is mildy increased. The right ventricular systolic function is mildly decreased. Moderate aortic stenosis with peak aortic valve velocity 3.5 m/s and mean pressure gradient of 23 mmHg. Mild to moderate aortic regurgitation. Mild mitral regurgitation.  Moderate tricuspid regurgitation.  Estimated RVSP 93 mmHg.     2. Aorta status  A. 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 (Again there is small area of persistent perfusion of the aneurysm sac which appears to have diminished since prior exam  and may be related to partial thrombosis versus technique). At subsequent follow-up, would encourage delayed evaluation in addition to arterial phase evaluation. Maximal aneurysmal dilatation involving the descending segment measures 45 x 39 mm.     3. Hypertension.  4. Hyperlipidemia.   5. Type I Diabetes Mellitus.  6. Bipolar disorder.  7. Fatty liver.  8. Renal insufficiency.  A. Normal renal ultrasound October 23, 2011.  9. Basilar Airway disease  Past Medical History:   Diagnosis Date    Arrhythmia     Arthritis     Atrial fibrillation     Atrial flutter     Bipolar affective     BPH (benign prostatic hyperplasia)     Complication of anesthesia     resp. asessment    Diabetes mellitus     Diabetic  neuropathy     Gout     Heart murmur     HOH (hard of hearing)     Hyperlipidemia     Hypertension     Paroxysmal atrial fibrillation     Pulmonary hypertension     Renal insufficiency     Type 2 diabetes mellitus, controlled     Wears glasses      Past Surgical History:   has a past surgical history that includes Appendectomy; Hernia repair; TONSILLECTOMY, ADENOIDECTOMY; CARDIAC ABLATION; Cardioversion; TEE; Cardiac catheterization; Tonsillectomy; Pericardiocentesis (Left, 11/25/2016); FLUORO-NO CHARGE (N/A, 11/22/2016); Paracentesis (2011); and Paracentesis (06/22/2019).  Family History:  family history includes Diabetes in his brother, mother, and sister; Heart disease in his brother and mother; Hyperlipidemia in his brother, brother, and sister; Hypertension in his brother, brother, daughter, mother, and sister; Kidney disease in his brother.  Social History:   reports that he quit smoking about 26 years ago. His smoking use included cigarettes. He has a 12.00 pack-year smoking history. He has never used smokeless tobacco. He reports that he does not drink alcohol or use drugs.     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  Medications:    Current Outpatient Medications:     acetaminophen (TYLENOL) 500 MG tablet, Take 500 mg by mouth every 6 (six) hours as needed for Pain., Disp: , Rfl:     allopurinol (ZYLOPRIM) 300 MG tablet, Take 300 mg by mouth daily., Disp: , Rfl:     amiodarone (PACERONE) 200 MG tablet, Take 1 tablet (200 mg total) by mouth daily, Disp: 90 tablet, Rfl: 1    atenolol (TENORMIN) 100 MG tablet, Take 100 mg by mouth 2 (two) times daily, Disp: , Rfl:     divalproex EC/DR tablet (DEPAKOTE EC/DR) 250 MG EC tablet, Take 500 mg by mouth 2 (two) times daily  , Disp: , Rfl:     empagliflozin (Jardiance) 10 MG tablet, Take 1  tablet (10 mg total) by mouth every morning, Disp:  , Rfl:     ferrous sulfate 325 (65 FE) MG tablet, Take 325 mg by mouth every morning with breakfast, Disp: , Rfl:     gabapentin (NEURONTIN) 100 MG capsule, Take 100 mg by mouth 2 (two) times daily., Disp: , Rfl:     hydrALAZINE (APRESOLINE) 25 MG tablet, Take 1 tablet (25 mg total) by mouth 2 (two) times daily, Disp: 60 tablet, Rfl: 11    isosorbide mononitrate (IMDUR) 60 MG  24 hr tablet, Take 1 tablet (60 mg total) by mouth daily, Disp: 30 tablet, Rfl: 11    Loperamide HCl (IMODIUM PO), Take by mouth daily as needed., Disp: , Rfl:     Multiple Vitamin (MULTIVITAMIN) capsule, Take 1 capsule by mouth daily., Disp: , Rfl:     pravastatin (PRAVACHOL) 40 MG tablet, Take 40 mg by mouth every evening., Disp: , Rfl:     rivaroxaban (Xarelto) 15 MG Tab, Take 1 tablet (15 mg total) by mouth daily with dinner, Disp: 30 tablet, Rfl: 11    SENNA CO, by Combination route daily as needed., Disp: , Rfl:     tamsulosin (FLOMAX) 0.4 MG Cap, Take 0.4 mg by mouth daily., Disp: , Rfl:     torsemide (DEMADEX) 20 MG tablet, Take 20 mg by mouth daily, Disp: , Rfl:     Vitals:    Visit Vitals  BP 194/84   Pulse (!) 54   Wt 67.5 kg (148 lb 12.8 oz)   BMI 23.31 kg/m     Weight is stable per his report.       Physical exam was not completed at this time due to Covid 19      Laboratory Data:  Lab Results   Component Value Date/Time    WBC 13.3 (H) 12/09/2016 07:10 AM    RBC 3.14 (L) 12/09/2016 07:10 AM    HGB 8.9 (L) 12/09/2016 07:10 AM    HCT 29.2 (L) 12/09/2016 07:10 AM    PLT 621 (H) 12/09/2016 07:10 AM    TSH 4.57 (H) 06/14/2019 11:56 AM     Lab Results   Component Value Date/Time    NA 143 07/04/2019 07:36 AM    K 4.6 07/04/2019 07:36 AM    CL 107 07/04/2019 07:36 AM    CO2 26 07/04/2019 07:36 AM    GLU 129 (H) 07/04/2019 07:36 AM    BUN 52 (H) 07/04/2019 07:36 AM    CREAT 1.70 (H) 07/12/2019 09:01 AM    CREAT 2.16 (H) 07/04/2019 07:36 AM    PROT 7.2 06/20/2019 01:38 PM     ALKPHOS 140 06/20/2019 01:38 PM    AST 18 06/20/2019 01:38 PM    ALT 11 06/20/2019 01:38 PM     Lab Results   Component Value Date/Time    CHOL 70 (L) 11/26/2016 03:02 AM    TRIG 78 11/26/2016 03:02 AM    HDL 21 (L) 11/26/2016 03:02 AM    LDL 33 11/26/2016 03:02 AM     Lab Results   Component Value Date/Time    HGBA1CPERCNT 6.5 11/26/2016 03:02 AM     Lab Results   Component Value Date/Time    BNP 1,041.5 (H) 11/15/2016 11:10 AM     07/12/2019  Sodium 139  Potassium 4.0  co2 30  Glucose 116  Bun 37  crt 1.7    Assessment and Plan:  1. Chronic diastolic heart failure     2. Persistent atrial fibrillation     3. Chronic anticoagulation     4. Chronic kidney disease, unspecified CKD stage     5. Essential hypertension           My impression is that, [name] has HFpEF.  Appears to be doing well on his current regimen of diuretics.   Recommend continuing low-dose torsemide.     Blood pressure continues to be moderately elevated at home.  I recommend increase hydralazine 50 mg three times per day.  We will  talk by phone in 1 week.     If hand continues to be an issue, he should see his primary care provider.     Counseling:  -I have encouraged pt to continue to follow a <2gm/day Na restriction and limit fluid intake to < 2L/day.  -I have requested pt perform daily wts and contact our office for increase of >2-3lbs in 1 day or 5lbs in 1 week.   -I have encouraged pt to engage in aerobic activity as tolerated and avoid heavy weight lifting.   -Smoking/Alcohol: I have advised pt to abstain from any tobacco use and excessive alcohol intake.    -Weight: Obesity is a significant risk for both short and long-term morbidity and mortality for AHF patients.  I have stressed the importance of weight control and dietary management.     Our office is available to answer any questions you or the patient may have. We appreciate the opportunity to participate in the care of your patients.      Electronically signed by:    Norval Gable.  Abigail Butts, FNP-C  Clinical Coordinator for Advance Heart Failure and [facility]  [address]  [place], [place] [address]  [phone]   [phone] - portable phone  [phone] - fax         Note: This chart was generated by the [facility] EMR system/speech recognition and may contain inherit omission or errors not intended by the user.  Grammatical errors, random word insertions, deletions, pronoun errors and incomplete sentences are occasionally consequences of this technology due to software limitations.  Not all errors are caught or corrected.  If there are questions or concerns about the content of this note or information contained in the body of this dictation they should be addressed directly with the author for clarification.

## 2019-09-21 ENCOUNTER — Encounter: Payer: Self-pay | Admitting: Family Nurse Practitioner

## 2019-09-21 ENCOUNTER — Ambulatory Visit (HOSPITAL_BASED_OUTPATIENT_CLINIC_OR_DEPARTMENT_OTHER)
Admission: RE | Admit: 2019-09-21 | Discharge: 2019-09-21 | Disposition: A | Discharge status: Home / Self Care | Payer: No Typology Code available for payment source | Source: Ambulatory Visit | Admitting: Family Nurse Practitioner

## 2019-09-21 VITALS — BP 167/74 | HR 66 | Wt 150.2 lb

## 2019-09-21 DIAGNOSIS — Z7901 Long term (current) use of anticoagulants: Secondary | ICD-10-CM

## 2019-09-21 DIAGNOSIS — I4819 Other persistent atrial fibrillation: Secondary | ICD-10-CM

## 2019-09-21 DIAGNOSIS — N189 Chronic kidney disease, unspecified: Secondary | ICD-10-CM

## 2019-09-21 DIAGNOSIS — I5032 Chronic diastolic (congestive) heart failure: Secondary | ICD-10-CM

## 2019-09-21 DIAGNOSIS — I10 Essential (primary) hypertension: Secondary | ICD-10-CM

## 2019-09-21 MED ORDER — HYDRALAZINE HCL 25 MG PO TABS
75.0000 mg | ORAL_TABLET | Freq: Three times a day (TID) | ORAL | 11 refills | Status: DC
Start: 2019-09-21 — End: 2020-09-30

## 2019-09-21 NOTE — Patient Instructions (Addendum)
Heart failure causes many symptoms.   Some of them are more serious than others.   It is important to recognize when you should call 911 for emergency help and when you should call your doctor or nurse for urgent attention.    The following lists can help you decide what to do when you experience certain symptoms. You may want to print this page and keep it in a convenient spot.    Emergency Symptoms of Heart Failure  Call 911 for emergency help, if you have:    -Chest discomfort or pain that lasts more than 15 minutes that is not relieved with rest or nitroglycerin.  -Severe, persistent shortness of breath.  -Fainted or passed out.    Urgent Symptoms of Heart Failure  Call your doctor immediately, if you have any of the following symptoms:    -Increasing shortness of breath or a new shortness of breath while resting.  -Trouble sleeping due to difficulty breathing. For example, waking up suddenly at night due to difficulty breathing.  -A need to sleep sitting up or on more pillows than usual.  -Fast or irregular heart beats, palpitations, or a "racing heart" that persists and makes you feel dizzy or lightheaded.    Or if you:  -Cough up frothy or pink sputum.  -Feel like you may faint or pass out.    To manage your heart failure, please follow the following recommendations:    Fluid restriction:  You should follow a daily fluid restriction not to exceed 2 Liters daily (equivalent to 64 fluid ounces). This includes all things liquid at room temperature such as: all beverages, soup, ice cream, popsicles, jello and ice (you will need to guess how much the ice would be when melted and add toward your daily total). Keep track of your fluid intake throughout the day and measure everything.     Daily weights:  Weigh yourself every day when you wake up, right after using the bathroom. Keep a record of your daily weights and call our office if you gain more than 2-3 pounds in 1 day or 5 pounds in less than a week.     Low  sodium diet:  Sodium can lead to fluid retention and patients with heart failure should follow the low sodium diet, do not exceed 2000 mg sodium daily. Do not cook with salt or add it at the table. This includes table salt, sea salt, Kosher salt, garlic salt, seasoned salt or other seasoning's with salt (read the nutrition label). Avoid eating out at restaurants and high sodium foods such as processed meats, pre-packaged meals, pickles, soy sauce, chips, canned soups and bacon. Ask your healthcare provider for more information.    Activity:  We encourage you to engage in regular physical activity, such as daily walking. Start with short distances and walk at a comfortable pace. You should be able to "walk and talk". Gradually increase the distance and time that you walk. Exercise will help you feel better and can relieve the symptom of "heavy, tired legs". Stop exercise if you have chest pain, dizziness, or get so short of breath that you can not talk.   You should not lift/push/or pull heavy objects (anything heavy enough to make you strain).     Medications:  Take all your medications as prescribed by your health care provider and bring all your medication bottles to every appointment. It is important that you know what you are taking, when, and for what reason.      Medications you should not take:  Nonsteroidal anti-inflammatory drugs (NSAIDS): such as ibuprofen, Advil, Motrin, naprosyn, Naproxen, may cause fluid retention. You should not take these medications.    Call your healthcare provider if you are not feeling well or have new problems. We will call you back within 24 hours (Monday-Friday). In case of an emergency, call 911 or go to the nearest emergency department.      Thank you for choosing the [place] [facility] with [facility] for your care. It is important to keep all your medical appointments, even if you feel well. To reschedule or cancel an appointment  with our clinic, call [phone]. If you need to leave a message on the nurse line for the heart failure clinic, please call [phone].      Increase hydralazine 25 mg - 3 tablets three times per day to help control your blood pressure.     Please have repeat lab work. We will call you to review your labs when we receive them.

## 2019-09-21 NOTE — Progress Notes (Signed)
[facility]      Advanced Heart Failure Established Visit    [name]   Date of Birth: 1951-08-02    Provider: Belva Agee, NP     Patient Care Team:  [name], MD as PCP - General (Family Medicine)  [name], DO as Consulting Physician (Cardiology)  [name], MD as Consulting Physician (Thoracic and Cardiac Surgery)    Chief Complaint:      Follow up     HPI:  I had the pleasure of speaking to Mr. [name] today from  [facility] for an advanced heart failure new appointment for hospital follow up.      This is a telehealth visit which was conducted with the use of interactive HIPPA compliant audio only, video option unavailable to patient telecommunication that permitted real time communication between Mr. [name] and myself. He consented to participation and received services at home, while I was located at the [facility]. This visit was changed from an in-person visit to a telehealth visit to lower the risk of exposure and / or spread of the current pandemic with the SARS CoV-2 virus. This is based on the guidelines from the [facility] and other health agencies.    Blood pressures at home are better but still higher than recommended. His blood pressure today was 167/74. This is typically around where it is.  He had one blood pressure 140/60s.  He denies any dizziness. Denies any weight gain. States he is feeling well.  Is scheduled for repeat lab work on 2/18.      [name] is a 69 y.o. male with HFpEF, NYHA Class 3, ACC/AHA.  Also has a history of paroxysmal atrial fibrillation status post ablation, diabetes, chronic kidney disease stage III-IV, hypertension hyperlipidemia bipolar disorder. From a cardiovascular standpoint, [place] states has not done well.      He denies any fever, chills, night sweats,  lightheadedness, dizziness, presyncope or syncope, shortness of breath at rest, orthopnea, PND, increasing fatigue or decreasing exercise tolerance. He has had no chest pain or pressure.  His appetite has been stable and he denies any abdominal bloating or distention, early satiety, anorexia, nausea, vomiting or changes in bowel or bladder habits.     Benjimin reports that he has not been exercising.  He does check blood pressures at home.   He has been checking morning weights; his weight has been stable and slightly declined since discharge.  [name] has been following a fluid restriction of 2 L/day and has restricting dietary sodium content to no more than 2 gm/day.  He reports that he has been compliant with his medications.      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    Chronic anticoagulation 11/25/2016    Stroke 11/25/2016  CKD (chronic kidney disease) 11/25/2016    Former smoker, stopped smoking many years ago 11/25/2016    Type 2 diabetes mellitus 11/25/2016    HTN (hypertension) 11/25/2016    HLD (hyperlipidemia) 11/25/2016    Diabetic neuropathy 11/25/2016    COPD (chronic obstructive pulmonary disease) 11/25/2016    Bipolar disorder 11/25/2016    BPH (benign prostatic hyperplasia) 11/25/2016    Atrial fibrillation 11/22/2016    Chronic atrial fibrillation 10/08/2016    Stage 4 chronic kidney disease 10/08/2016    Persistent atrial fibrillation 10/07/2016    Fatty liver 10/07/2016     Past Medical History:  1. Atrial fibrillation  A.  Cardioversion 10/22/11.   B. Echo 10/22/11- moderate-severe concentric LVH. Biatrial enlargement. Mild AS, dilated inferior vena cava, small pericardial effusion. Peak velocity across the AV at 2.2 meters/s.  C. Cardiac abltation 01/13/15.   D. MRI Cardiac 03/16/16- LVEF 68.4%. Moderate septal wall hypertrophy (1.6cm) and mild posterior wall hypertrophy (1.3cm). Focal area of mid-myocardial wall delayed enhancement in basal anteroseptal wall and RV insertion point consistent with hypertensive heart disease. RVEF 41.7%. Severe LA enlargement and moderate RA enlargement. 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 Mild AS with peak velocity 2.26m/s. Mild central AR. Mild MR and TR. Small to moderate pericardial effusion. Mildly dilated ascending aorta. Aortic arch and descending thoracic aorta are ectatic. Trivial right pleural effusion. Dilated central pulm artery at 4.3cm.   E. s/p TEE and [place] cardioversion 06/04/16  F. Convergent AF Ablation 11/22/16 - Procedure was complicated with pericardial effusion/ tamponade requiring pericardiocentesis. He developed left pleural effusion with thoracentesis.   G. Echo 12/03/16- No pericardial effusion, aortic valve is severely calcified with severely restricted systolic opening, mild regurgitation, LV is normal in size with moderate to severe concentric hypertrophy, systolic function is preserved with an EF 02-58%, diastolic dysfunction that couldn't be graded.   H. Echo 12/08/17- EF 60-65%. Moderate concentric LV hypertrophy. LA and RA severely dilated. Mild MR and moderate TR. Moderate valvular aortic stenosis. Mild to moderate AR and pulmonic valvular regurgitation. Mildly dilated ascending aorta 3.8-4.2 cm.   I. Echo 01/29/19- Moderate to severe concentric LVH, EF 52-77%, grade 2 diastolic dysfunction of the LV (with evidence of increased left atrial pressure), RV size is moderately increased, RV hypertrophy, RV systolic function is  mild to moderately decreased, proximal ascending aorta is mildly dilated at 4.2cm. moderate aortic stenosis, mild to moderate insufficiency, mitral valve regurgitation is mild, tricuspid regurgitation is moderate, estimated pulm arterial systolic pressure is severely elevated, 50mmHg, pulm valve regurgitation is mild to moderate.   J. Echo 06/21/2019  ef 65-70%. right ventricular size is mildy increased. The right ventricular systolic function is mildly decreased. Moderate aortic stenosis with peak aortic valve velocity 3.5 m/s and mean pressure gradient of 23 mmHg. Mild to moderate aortic regurgitation. Mild mitral regurgitation.  Moderate tricuspid regurgitation.  Estimated RVSP 93 mmHg.     2. Aorta status  A. 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 (Again there is small area of persistent perfusion of the aneurysm sac which appears to have diminished since prior exam and may be related to partial thrombosis versus technique). At subsequent follow-up, would encourage delayed evaluation in addition to arterial phase evaluation. Maximal aneurysmal dilatation involving the descending segment measures 45 x 39 mm.     3. Hypertension.  4. Hyperlipidemia.  5. Type I Diabetes Mellitus.  6. Bipolar disorder.  7. Fatty liver.  8. Renal insufficiency.  A. Normal renal ultrasound October 23, 2011.  9. Basilar Airway disease  Past Medical History:   Diagnosis Date    Arrhythmia     Arthritis     Atrial fibrillation     Atrial flutter     Bipolar affective     BPH (benign prostatic hyperplasia)     Complication of anesthesia     resp. asessment    Diabetes mellitus     Diabetic neuropathy     Gout     Heart murmur     HOH (hard of hearing)     Hyperlipidemia     Hypertension     Paroxysmal atrial fibrillation     Pulmonary hypertension     Renal insufficiency     Type 2 diabetes mellitus, controlled      Wears glasses      Past Surgical History:   has a past surgical history that includes Appendectomy; Hernia repair; TONSILLECTOMY, ADENOIDECTOMY; CARDIAC ABLATION; Cardioversion; TEE; Cardiac catheterization; Tonsillectomy; Pericardiocentesis (Left, 11/25/2016); FLUORO-NO CHARGE (N/A, 11/22/2016); Paracentesis (2011); and Paracentesis (06/22/2019).  Family History:  family history includes Diabetes in his brother, mother, and sister; Heart disease in his brother and mother; Hyperlipidemia in his brother, brother, and sister; Hypertension in his brother, brother, daughter, mother, and sister; Kidney disease in his brother.  Social History:   reports that he quit smoking about 26 years ago. His smoking use included cigarettes. He has a 12.00 pack-year smoking history. He has never used smokeless tobacco. He reports that he does not drink alcohol or use drugs.     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  Medications:    Current Outpatient Medications:     acetaminophen (TYLENOL) 500 MG tablet, Take 500 mg by mouth every 6 (six) hours as needed for Pain., Disp: , Rfl:     allopurinol (ZYLOPRIM) 300 MG tablet, Take 300 mg by mouth daily., Disp: , Rfl:     amiodarone (PACERONE) 200 MG tablet, Take 1 tablet (200 mg total) by mouth daily, Disp: 90 tablet, Rfl: 1    atenolol (TENORMIN) 100 MG tablet, Take 100 mg by mouth 2 (two) times daily, Disp: , Rfl:     divalproex EC/DR tablet (DEPAKOTE EC/DR) 250 MG EC tablet, Take 500 mg by mouth 2 (two) times daily  , Disp: , Rfl:     empagliflozin (Jardiance) 10 MG tablet, Take 1 tablet (10 mg total) by mouth every morning, Disp:  , Rfl:     ferrous sulfate 325 (65 FE) MG tablet, Take 325 mg by mouth every morning with breakfast, Disp: , Rfl:     gabapentin (NEURONTIN) 100 MG capsule, Take 100 mg by mouth 2 (two)  times daily., Disp: , Rfl:     hydrALAZINE (APRESOLINE) 25 MG tablet, Take 2 tablets (50 mg total) by mouth 3 (three) times daily, Disp: 90 tablet, Rfl: 11    isosorbide mononitrate (IMDUR) 60 MG 24 hr tablet, Take 1 tablet (60 mg total) by mouth daily, Disp: 30 tablet, Rfl: 11    Loperamide HCl (IMODIUM PO), Take by mouth daily as needed., Disp: , Rfl:     Multiple Vitamin (MULTIVITAMIN) capsule, Take 1 capsule by  mouth daily., Disp: , Rfl:     pravastatin (PRAVACHOL) 40 MG tablet, Take 40 mg by mouth every evening., Disp: , Rfl:     rivaroxaban (Xarelto) 15 MG Tab, Take 1 tablet (15 mg total) by mouth daily with dinner, Disp: 30 tablet, Rfl: 11    SENNA CO, by Combination route daily as needed., Disp: , Rfl:     tamsulosin (FLOMAX) 0.4 MG Cap, Take 0.4 mg by mouth daily., Disp: , Rfl:     torsemide (DEMADEX) 20 MG tablet, Take 20 mg by mouth daily, Disp: , Rfl:     Vitals:    Visit Vitals  BP 167/74   Pulse 66   Wt 68.1 kg (150 lb 3.2 oz)   BMI 23.52 kg/m     Weight is stable per his report.       Physical exam was not completed at this time due to Covid 19      Laboratory Data:  Lab Results   Component Value Date/Time    WBC 13.3 (H) 12/09/2016 07:10 AM    RBC 3.14 (L) 12/09/2016 07:10 AM    HGB 8.9 (L) 12/09/2016 07:10 AM    HCT 29.2 (L) 12/09/2016 07:10 AM    PLT 621 (H) 12/09/2016 07:10 AM    TSH 4.57 (H) 06/14/2019 11:56 AM     Lab Results   Component Value Date/Time    NA 143 07/04/2019 07:36 AM    K 4.6 07/04/2019 07:36 AM    CL 107 07/04/2019 07:36 AM    CO2 26 07/04/2019 07:36 AM    GLU 129 (H) 07/04/2019 07:36 AM    BUN 52 (H) 07/04/2019 07:36 AM    CREAT 1.70 (H) 07/12/2019 09:01 AM    CREAT 2.16 (H) 07/04/2019 07:36 AM    PROT 7.2 06/20/2019 01:38 PM    ALKPHOS 140 06/20/2019 01:38 PM    AST 18 06/20/2019 01:38 PM    ALT 11 06/20/2019 01:38 PM     Lab Results   Component Value Date/Time    CHOL 70 (L) 11/26/2016 03:02 AM    TRIG 78 11/26/2016 03:02 AM    HDL 21 (L) 11/26/2016 03:02 AM    LDL  33 11/26/2016 03:02 AM     Lab Results   Component Value Date/Time    HGBA1CPERCNT 6.5 11/26/2016 03:02 AM     Lab Results   Component Value Date/Time    BNP 1,041.5 (H) 11/15/2016 11:10 AM     Assessment and Plan:  1. Chronic diastolic heart failure     2. Persistent atrial fibrillation     3. Chronic anticoagulation     4. Chronic kidney disease, unspecified CKD stage     5. Essential hypertension           My impression is that, Mr. Schue has HFpEF.  Appears to be doing well on his current regimen of diuretics.   Recommend continuing low-dose torsemide.     Blood pressure continues to be mildly elevated at home.  I recommend increase hydralazine 75 mg three times per day.  We will talk by phone in 3 week. I have asked him to have labs completed. I will call him when I receive results which should be in the next week.  He will continue to monitor bp and weight at home and notify our clinic if he has increase in either reading or increased heart failure symptoms.      Counseling:  -I have encouraged pt to  continue to follow a <2gm/day Na restriction and limit fluid intake to < 2L/day.  -I have requested pt perform daily wts and contact our office for increase of >2-3lbs in 1 day or 5lbs in 1 week.   -I have encouraged pt to engage in aerobic activity as tolerated and avoid heavy weight lifting.   -Smoking/Alcohol: I have advised pt to abstain from any tobacco use and excessive alcohol intake.    -Weight: Obesity is a significant risk for both short and long-term morbidity and mortality for AHF patients.  I have stressed the importance of weight control and dietary management.     Our office is available to answer any questions you or the patient may have. We appreciate the opportunity to participate in the care of your patients.      Electronically signed by:    Norval Gable. Abigail Butts, FNP-C  Clinical Coordinator for Advance Heart Failure and [facility]  [address]  [place], [place] [address]  [phone]   [phone] - portable phone  [phone] - fax         Note: This chart was generated by the [facility] EMR system/speech recognition and may contain inherit omission or errors not intended by the user.  Grammatical errors, random word insertions, deletions, pronoun errors and incomplete sentences are occasionally consequences of this technology due to software limitations.  Not all errors are caught or corrected.  If there are questions or concerns about the content of this note or information contained in the body of this dictation they should be addressed directly with the author for clarification.

## 2019-09-24 ENCOUNTER — Ambulatory Visit
Payer: No Typology Code available for payment source | Attending: Family Nurse Practitioner | Admitting: Pulmonary Disease

## 2019-09-24 VITALS — BP 152/84 | HR 97 | Resp 16 | Ht 67.0 in | Wt 150.8 lb

## 2019-09-24 DIAGNOSIS — R0683 Snoring: Secondary | ICD-10-CM

## 2019-09-24 DIAGNOSIS — G4733 Obstructive sleep apnea (adult) (pediatric): Secondary | ICD-10-CM

## 2019-09-24 NOTE — Progress Notes (Signed)
Subjective:    Patient ID: [name] is a 69 y.o. male.    HPI chief complaint: I am not sure why I am here  Very pleasant 69 year old gentleman apparently referred from cardiology.  He has a history of paroxysmal atrial fibrillation.  He is on amiodarone as well as other medications.  The patient states that he does snore.  He does complain of mild daytime fatigue.  Epworth Sleepiness Scale about 10.  No family members with sleep apnea.  He is a retired Marine scientist.  States that his atrial fibrillation is well controlled he is rarely ever in it.  States that he is lost weight approximately 20 pounds over the past several months.  Denies nocturia.  Denies sleepwalking or sleep talking.  Current medications noted      The following portions of the patient's history were reviewed and updated as appropriate: allergies, current medications, past family history, past medical history, past social history, past surgical history and problem list.    Review of Systems  Denies fevers chills or sweats      Objective:    Physical Exam  Elderly gentleman no acute distress vital signs noted  Face symmetric neck supple no adenopathy trachea midline mobile no stridor  Neck circumference normal  Mental status oriented x3 adequate memory and mood  Gait and station normal      Assessment:       1. Primary snoring    2. Obstructive sleep apnea syndrome          Plan:       Very likely that patient may have obstructive sleep apnea given his history of snoring and mild daytime fatigue.  Pathophysiology of the obstructive CPAP syndrome was discussed with the patient.  The usual testing environment and treatment modalities were reviewed.  We will follow up with the patient after the sleep study has been performed.   We will start the work-up with a home sleep study.  Follow-up with him after that sleep studies performed.  [place]

## 2019-09-26 ENCOUNTER — Ambulatory Visit
Admission: RE | Admit: 2019-09-26 | Discharge: 2019-09-26 | Disposition: A | Discharge status: Home / Self Care | Payer: No Typology Code available for payment source | Source: Ambulatory Visit | Attending: Internal Medicine | Admitting: Internal Medicine

## 2019-09-26 DIAGNOSIS — M21371 Foot drop, right foot: Secondary | ICD-10-CM | POA: Insufficient documentation

## 2019-09-26 DIAGNOSIS — I6782 Cerebral ischemia: Secondary | ICD-10-CM | POA: Insufficient documentation

## 2019-09-26 DIAGNOSIS — R270 Ataxia, unspecified: Secondary | ICD-10-CM | POA: Insufficient documentation

## 2019-09-26 DIAGNOSIS — R531 Weakness: Secondary | ICD-10-CM | POA: Insufficient documentation

## 2019-10-01 ENCOUNTER — Encounter: Payer: Self-pay | Admitting: Pulmonary Disease

## 2019-10-01 DIAGNOSIS — G4733 Obstructive sleep apnea (adult) (pediatric): Secondary | ICD-10-CM

## 2019-10-01 DIAGNOSIS — I4891 Unspecified atrial fibrillation: Secondary | ICD-10-CM

## 2019-10-01 DIAGNOSIS — I519 Heart disease, unspecified: Secondary | ICD-10-CM

## 2019-10-01 DIAGNOSIS — G4710 Hypersomnia, unspecified: Secondary | ICD-10-CM

## 2019-10-01 DIAGNOSIS — R0683 Snoring: Secondary | ICD-10-CM

## 2019-10-04 ENCOUNTER — Ambulatory Visit
Admission: RE | Admit: 2019-10-04 | Discharge: 2019-10-04 | Disposition: A | Discharge status: Home / Self Care | Payer: No Typology Code available for payment source | Source: Ambulatory Visit | Attending: Pulmonary Disease | Admitting: Pulmonary Disease

## 2019-10-04 DIAGNOSIS — G4733 Obstructive sleep apnea (adult) (pediatric): Secondary | ICD-10-CM | POA: Insufficient documentation

## 2019-10-04 DIAGNOSIS — I519 Heart disease, unspecified: Secondary | ICD-10-CM | POA: Insufficient documentation

## 2019-10-04 DIAGNOSIS — R0683 Snoring: Secondary | ICD-10-CM | POA: Insufficient documentation

## 2019-10-04 DIAGNOSIS — I4891 Unspecified atrial fibrillation: Secondary | ICD-10-CM | POA: Insufficient documentation

## 2019-10-04 DIAGNOSIS — G4710 Hypersomnia, unspecified: Secondary | ICD-10-CM

## 2019-10-04 NOTE — Progress Notes (Signed)
Patient instructed on the use and return of watch pat by Pritika Alvarez J. Caedmon Louque RPSGT.

## 2019-10-05 NOTE — Addendum Note (Signed)
Encounter addended by: Rosemarie Ax on: 10/05/2019 12:44 PM   Actions taken: Charge Capture section accepted

## 2019-10-09 NOTE — H&P (Signed)
Date: 10/04/2019  STUDY TYPE: Overnight polysomnography.    REQUESTING PHYSICIAN: Roderic Ovens, MD    INTERPRETING PHYSICIAN: Roderic Ovens, MD    INDICATION: Evaluate for sleep apnea.    Body mass index of 24.    FINDINGS: The patient was studied for 6 hours 10 minutes.  During this time, 51 episodes of oxygen desaturation were noted.  Lowest saturation recorded was 81%.  The patient spends 97  minutes of this overnight study with an oxygen saturation of  less than 89%.  Occasional snoring was noted.  During the study,  59 sleep-related breathing events were identified.  This  achieves an apnea-hypopnea index of 10.    ASSESSMENT: This overnight polysomnography does demonstrate  obstructive sleep apnea syndrome with a modestly elevated  apnea-hypopnea index and episodes of oxygen desaturation which  were persistent.    Given this, would recommend returning the patient to Sleep Lab  for followup overnight polysomnography with CPAP titration.  Alternatively, auto titrating CPAP could be considered.        I hereby certify this patient for hospitalization based upon   medical necessity as noted above.    [id number]  DD: 10/09/2019 08:11:32  DT: 10/09/2019 09:17:19  JOB: [id number]

## 2019-10-12 ENCOUNTER — Inpatient Hospital Stay: Admission: RE | Admit: 2019-10-12 | Payer: Self-pay | Source: Ambulatory Visit

## 2019-10-12 ENCOUNTER — Ambulatory Visit
Admission: RE | Admit: 2019-10-12 | Discharge: 2019-10-12 | Disposition: A | Discharge status: Home / Self Care | Payer: No Typology Code available for payment source | Source: Ambulatory Visit | Attending: Family Nurse Practitioner | Admitting: Family Nurse Practitioner

## 2019-10-12 ENCOUNTER — Telehealth (RURAL_HEALTH_CENTER): Payer: Self-pay | Admitting: Family Medicine

## 2019-10-12 DIAGNOSIS — I10 Essential (primary) hypertension: Secondary | ICD-10-CM | POA: Insufficient documentation

## 2019-10-12 DIAGNOSIS — I5032 Chronic diastolic (congestive) heart failure: Secondary | ICD-10-CM | POA: Insufficient documentation

## 2019-10-12 DIAGNOSIS — N189 Chronic kidney disease, unspecified: Secondary | ICD-10-CM

## 2019-10-12 LAB — BASIC METABOLIC PANEL
Anion Gap: 13.1 mMol/L (ref 7.0–18.0)
BUN / Creatinine Ratio: 18.8 Ratio (ref 10.0–30.0)
BUN: 36 mg/dL — ABNORMAL HIGH (ref 7–22)
CO2: 29 mMol/L (ref 20–30)
Calcium: 8.9 mg/dL (ref 8.5–10.5)
Chloride: 91 mMol/L — ABNORMAL LOW (ref 98–110)
Creatinine: 1.92 mg/dL — ABNORMAL HIGH (ref 0.80–1.30)
EGFR: 35 mL/min/{1.73_m2} — ABNORMAL LOW (ref 60–150)
Glucose: 756 mg/dL (ref 71–99)
Osmolality Calculated: 304 mOsm/kg — ABNORMAL HIGH (ref 275–300)
Potassium: 4.1 mMol/L (ref 3.5–5.3)
Sodium: 129 mMol/L — ABNORMAL LOW (ref 136–147)

## 2019-10-12 LAB — MAGNESIUM: Magnesium: 2.6 mg/dL (ref 1.6–2.6)

## 2019-10-12 NOTE — Telephone Encounter (Addendum)
I was called by the lab that the glucose was 756.  I attempted to call the patient and left a voicemail for him to contact me back about his labs.     10-13-19 at 2:25 PMI called the patient again and left another message for him to call me back about his labs.

## 2019-10-15 ENCOUNTER — Emergency Department: Payer: No Typology Code available for payment source

## 2019-10-15 ENCOUNTER — Emergency Department (EMERGENCY_DEPARTMENT_HOSPITAL)
Admission: EM | Admit: 2019-10-15 | Discharge: 2019-10-15 | Disposition: A | Discharge status: Home / Self Care | Payer: No Typology Code available for payment source | Source: Home / Self Care | Attending: Emergency Medicine | Admitting: Emergency Medicine

## 2019-10-15 ENCOUNTER — Emergency Department
Admission: EM | Admit: 2019-10-15 | Discharge: 2019-10-15 | Disposition: A | Discharge status: Home / Self Care | Payer: No Typology Code available for payment source | Attending: Emergency Medicine | Admitting: Emergency Medicine

## 2019-10-15 DIAGNOSIS — L723 Sebaceous cyst: Secondary | ICD-10-CM | POA: Insufficient documentation

## 2019-10-15 DIAGNOSIS — L089 Local infection of the skin and subcutaneous tissue, unspecified: Secondary | ICD-10-CM | POA: Insufficient documentation

## 2019-10-15 DIAGNOSIS — E1165 Type 2 diabetes mellitus with hyperglycemia: Secondary | ICD-10-CM | POA: Insufficient documentation

## 2019-10-15 DIAGNOSIS — E785 Hyperlipidemia, unspecified: Secondary | ICD-10-CM | POA: Insufficient documentation

## 2019-10-15 DIAGNOSIS — I10 Essential (primary) hypertension: Secondary | ICD-10-CM | POA: Insufficient documentation

## 2019-10-15 DIAGNOSIS — R739 Hyperglycemia, unspecified: Secondary | ICD-10-CM

## 2019-10-15 DIAGNOSIS — D6832 Hemorrhagic disorder due to extrinsic circulating anticoagulants: Secondary | ICD-10-CM

## 2019-10-15 DIAGNOSIS — T457X5A Adverse effect of anticoagulant antagonists, vitamin K and other coagulants, initial encounter: Secondary | ICD-10-CM

## 2019-10-15 DIAGNOSIS — T457X1A Poisoning by anticoagulant antagonists, vitamin K and other coagulants, accidental (unintentional), initial encounter: Secondary | ICD-10-CM

## 2019-10-15 LAB — BASIC METABOLIC PANEL
Anion Gap: 15 mMol/L (ref 7.0–18.0)
BUN / Creatinine Ratio: 19.9 Ratio (ref 10.0–30.0)
BUN: 37 mg/dL — ABNORMAL HIGH (ref 7–22)
CO2: 25 mMol/L (ref 20–30)
Calcium: 9.2 mg/dL (ref 8.5–10.5)
Chloride: 96 mMol/L — ABNORMAL LOW (ref 98–110)
Creatinine: 1.86 mg/dL — ABNORMAL HIGH (ref 0.80–1.30)
EGFR: 36 mL/min/{1.73_m2} — ABNORMAL LOW (ref 60–150)
Glucose: 420 mg/dL — ABNORMAL HIGH (ref 71–99)
Osmolality Calculated: 291 mOsm/kg (ref 275–300)
Potassium: 4 mMol/L (ref 3.5–5.3)
Sodium: 132 mMol/L — ABNORMAL LOW (ref 136–147)

## 2019-10-15 LAB — CBC AND DIFFERENTIAL
Basophils %: 0.9 % (ref 0.0–3.0)
Basophils Absolute: 0 10*3/uL (ref 0.0–0.3)
Eosinophils %: 2.8 % (ref 0.0–7.0)
Eosinophils Absolute: 0.2 10*3/uL (ref 0.0–0.8)
Hematocrit: 43.6 % (ref 39.0–52.5)
Hemoglobin: 14.2 gm/dL (ref 13.0–17.5)
Lymphocytes Absolute: 0.6 10*3/uL (ref 0.6–5.1)
Lymphocytes: 11 % — ABNORMAL LOW (ref 15.0–46.0)
MCH: 29 pg (ref 28–35)
MCHC: 33 gm/dL (ref 32–36)
MCV: 90 fL (ref 80–100)
MPV: 9.9 fL (ref 6.0–10.0)
Monocytes Absolute: 0.6 10*3/uL (ref 0.1–1.7)
Monocytes: 10.9 % (ref 3.0–15.0)
Neutrophils %: 74.4 % (ref 42.0–78.0)
Neutrophils Absolute: 4 10*3/uL (ref 1.7–8.6)
PLT CT: 221 10*3/uL (ref 130–440)
RBC: 4.84 10*6/uL (ref 4.00–5.70)
RDW: 20.9 % — ABNORMAL HIGH (ref 11.0–14.0)
WBC: 5.4 10*3/uL (ref 4.0–11.0)

## 2019-10-15 LAB — VH URINALYSIS WITH MICROSCOPIC
Bilirubin, UA: NEGATIVE
Blood, UA: NEGATIVE
Glucose, UA: >=500 mg/dL — AB
Ketones UA: NEGATIVE mg/dL
Leukocyte Esterase, UA: NEGATIVE Leu/uL
Nitrite, UA: NEGATIVE
Protein, UR: NEGATIVE mg/dL
RBC, UA: 1 /hpf (ref 0–4)
Urine Specific Gravity: 1.011 (ref 1.001–1.040)
Urobilinogen, UA: NORMAL mg/dL
WBC, UA: <1 /hpf (ref 0–4)
pH, Urine: 5 pH (ref 5.0–8.0)

## 2019-10-15 LAB — VH DEXTROSE STICK GLUCOSE
Glucose POCT: 435 mg/dL — ABNORMAL HIGH (ref 71–99)
Glucose POCT: 253 mg/dL — ABNORMAL HIGH (ref 71–99)
Glucose POCT: 204 mg/dL — ABNORMAL HIGH (ref 71–99)

## 2019-10-15 LAB — BETA-HYDROXYBUTYRATE: Betahydroxybutyrate: 0.19 mMol/L (ref 0.02–0.27)

## 2019-10-15 LAB — ECG 12-LEAD
Patient Age: 68 years
Q-T Interval(Corrected): 445 ms
Q-T Interval: 438 ms
QRS Axis: 4 deg
QRS Duration: 91 ms
T Axis: 215 years
Ventricular Rate: 62 //min

## 2019-10-15 LAB — VH I-STAT TROPONIN NOTIFICATION

## 2019-10-15 LAB — TROPONIN I: Troponin I: 0.03 ng/mL — ABNORMAL HIGH (ref 0.00–0.02)

## 2019-10-15 LAB — I-STAT TROPONIN: Troponin I I-Stat: <0.02 ng/mL (ref 0.00–0.08)

## 2019-10-15 MED ORDER — AMOXICILLIN-POT CLAVULANATE 875-125 MG PO TABS
1.0000 | ORAL_TABLET | Freq: Two times a day (BID) | ORAL | 0 refills | Status: DC
Start: 2019-10-15 — End: 2019-10-19

## 2019-10-15 MED ORDER — SODIUM CHLORIDE 0.9 % IV BOLUS
1000.00 mL | Freq: Once | INTRAVENOUS | Status: AC
Start: 2019-10-15 — End: 2019-10-15
  Administered 2019-10-15: 13:00:00 1000 mL via INTRAVENOUS

## 2019-10-15 MED ORDER — LIDOCAINE-EPINEPHRINE 2 %-1:200000 IJ SOLN
INTRAMUSCULAR | Status: AC
Start: 2019-10-15 — End: ?
  Filled 2019-10-15: qty 10

## 2019-10-15 MED ORDER — HYDROCODONE-ACETAMINOPHEN 5-325 MG PO TABS
1.0000 | ORAL_TABLET | Freq: Once | ORAL | Status: AC
Start: 2019-10-15 — End: 2019-10-15
  Administered 2019-10-15: 19:00:00 1 via ORAL

## 2019-10-15 MED ORDER — VH INSULIN REGULAR HUMAN 100 UNIT/ML IJ SOLN
INTRAMUSCULAR | Status: AC
Start: 2019-10-15 — End: ?
  Filled 2019-10-15: qty 3

## 2019-10-15 MED ORDER — AMOXICILLIN-POT CLAVULANATE 875-125 MG PO TABS
ORAL_TABLET | ORAL | Status: AC
Start: 2019-10-15 — End: ?
  Filled 2019-10-15: qty 1

## 2019-10-15 MED ORDER — VH INSULIN REGULAR HUMAN 100 UNIT/ML IJ SOLN
6.00 [IU] | Freq: Once | INTRAMUSCULAR | Status: AC
Start: 2019-10-15 — End: 2019-10-15
  Administered 2019-10-15: 14:00:00 6 [IU] via INTRAVENOUS

## 2019-10-15 MED ORDER — AMOXICILLIN-POT CLAVULANATE 875-125 MG PO TABS
1.00 | ORAL_TABLET | Freq: Once | ORAL | Status: AC
Start: 2019-10-15 — End: 2019-10-15
  Administered 2019-10-15: 15:00:00 1 via ORAL

## 2019-10-15 MED ORDER — HYDROCODONE-ACETAMINOPHEN 5-325 MG PO TABS
ORAL_TABLET | ORAL | Status: AC
Start: 2019-10-15 — End: ?
  Filled 2019-10-15: qty 1

## 2019-10-15 NOTE — ED Notes (Signed)
Melynda Keller, PA at the bedside.

## 2019-10-15 NOTE — ED Triage Notes (Signed)
Patient reports he was screened for routine blood work by his PCP and was called today to let him know that his glucose was 715. He attempted to check this at home and his monitor read "HI". In triage he was 435. Patient reports he has had lightheadedness and vision changes for approximately one week. Patient previously checked his blood glucose once a week and only takes jardiance currently for diabetic management. Patient denies nausea, vomiting, diarrhea, change to his diet and activity, sick contacts, and medication changes.

## 2019-10-15 NOTE — Discharge Instructions (Signed)
Thank you for choosing [facility] for your emergency care needs. We strive to provide EXCELLENT care to you and your family.      YOUR ACCURATE CONTACT INFORMATION IS VERY IMPORTANT    Before leaving please check with registration to make sure we have an up-to-date contact number. A Toll-free post discharge customer service number is available to update your registration/insurance information as well as answer any billing questions or concerns. That number is [phone].       IF YOU DO NOT CONTINUE TO IMPROVE OR YOUR CONDITION WORSENS, PLEASE CONTACT YOUR DOCTOR OR RETURN IMMEDIATELY TO THE EMERGENCEY DEPARTMENT.      EXTRA AVAILABLE RESOURCES:    1. DOCTOR REFERRALS  a. Call  our Physician Referral Line @ [phone] If you need  further assistance with referrals to primary care or specialty services our Case Manager may assist at [phone].  2. FREE HEALTH SERVICES  a. www.freemedicalsearch.org  b. http://www.[facility].org  May be utilized if you need help with health or social services, please call 2-1-1 for a free referral to resources in your area. 2-1-1 is a free service connecting people with information on health insurance, free clinics, pregnancy, mental health, dental care, food assistance, housing, and substance abuse counseling.  3. MEDICAL RECORDS AND TESTS  Certain laboratory test results do not come back the same day, for example: urine cultures may take 3 days. We will attempt to contact you if other important findings are noted. Some lab testing may take 2-5 days. Radiology films are reviewed again to ensure accuracy. If there is any discrepancy, we will notify you. If you have questions or concerns, our Case Manager is available Monday thru Friday, 7:30a-3:00p, for follow up or test result questions. Please call [phone].  4. ED PATIENT ADVOCATE SERVICES: [phone]. Contact for general questions and concerns, daily 11:00a-11:00p.      DISCHARGE  MESSAGE:     YOU ARE THE MOST IMPORTANT FACTOR IN YOUR RECOVERY. Follow the above instructions carefully. Take your medicines as prescribed. Most important, see your doctor in follow-up as recommended by your ED physician.  Call your doctor if your condition worsens or if you have any new, worsening, or severe symptoms. If you need further care and cannot be seen by your recommended follow-up doctor, the Emergency Department Case Manager will be available to help as needed [phone].  If you require immediate assistance, return to the Emergency Department or call 911.    Pharmacy information  For your convenience please visit [facility] for your prescription needs. [facility] has extended evening and weekend hours, including holidays, to ensure patients can begin taking medications as soon as possible.  Most insurance plans are accepted.    Pharmacy Hours:  Monday - Friday: 8:30a to 8:30p  Saturday: 9a to 1p  Sunday: 9a to 1p and 6p to 9p  Holidays: 9a to 1p    Pharmacy Phone: [phone]      [facility] has been providing home care solutions for independent living since 1984. Servicing [place]. [facility] is a full service home medical provider of home oxygen and respiratory care, medical equipment and supplies. [phone].    Thanks Again, for allowing [facility]   Emergency Department to serve you.

## 2019-10-15 NOTE — ED Triage Notes (Signed)
Pt had cyst removed on back earlier this am came back this afternoon due to bleeding

## 2019-10-15 NOTE — ED Notes (Signed)
Bed: E53-A  Expected date:   Expected time:   Means of arrival:   Comments:  EMS

## 2019-10-15 NOTE — ED Provider Notes (Signed)
[place]  History and Physical Exam     [name], [name]  Encounter Date:  10/15/2019  Treating Provider: Marijo Sanes, PA-C  Supervising Physician: Karolee Stamps, MD   Room:  E53/E53-A  Patient DOB:  [birth year]  Age: 69 y.o. male  MRN:  [id number]  PCP: [name], MD      Diagnosis/Disposition:  ED Course/MDM:     Final Impression  1. Sebaceous cyst    2. Anticoagulant-induced bleeding        Disposition  ED Disposition     ED Disposition Condition Date/Time Comment    Discharge  Mon Oct 15, 2019  7:04 PM [facility]ayne Butcher [facility] discharge to home/self care.    Condition at disposition: Stable          Follow up  [name], MD  [address]  [place] [address]  [phone]          [facility] [place]  [place]  [phone]    If symptoms worsen      Prescriptions  New Prescriptions    No medications on file            Patient had incision and drainage of sebaceous cyst earlier today in the ED and started bleeding when he got home, so she was bandaged and onto his shirt.  The packing was removed and replaced with Surgicel, pressure dressing was placed with good hemostasis. Monitored here with no rebleed. Planning to follow in 2 day for wound check.       History of Presenting Illness:     Nurse Triage: Pt had cyst removed on back earlier this am came back this afternoon due to bleeding    Chief complaint: Coagulation Disorder      HPI/ROS given by: patient, unless otherwise stated    [name] is a 69 y.o. male presenting with bleeding.  On xarelto. Patient had incision and drainage of sebaceous cyst earlier today in the ED and started bleeding when he got home, so she was bandaged and onto his shirt. Denies other complaint.         Review of Systems:  Physical Exam:     ROS    Complete review of systems is otherwise negative except as indicated in the HPI.  Blood pressure  149/76, pulse 70, temperature 97.7 F (36.5 C), temperature source Temporal, resp. rate 22, height 1.702 m, weight 71.6 kg, SpO2 97 %.     Constitutional: WD/WN, active, NAD   Eyes: PERRL.  Conjunctiva pink, sclera anicteric, no discharge.  Visual acuity grossly intact.    HEENT:  NCAT. Ears: No external lesions.   Nose: No external lesions or discharge.    OP clear, MMM   Neck: Trachea midline. No JVD. Normal ROM.   Respiratory: Effort normal without accessory muscle use.   Cardiovascular: No appreciable peripheral edema. Brisk cap refill.  Musculoskeletal: Normal range of motion. No deformity or apparent injury. Strength grossly intact bilateral legs  Neurological: Pt is alert. No focal motor deficits. Speech normal. CN II-XII grossly normal.   Psychiatric: Affect is appropriate. There is no agitation.   Skin: Warm and dry. No rash. Incision over the mid back with slow ongoing bleeding.       PROCEDURES            Diagnostic Results:     RADIOLOGIC STUDIES    All images have been  personally viewed by me    Xr Chest Ap Portable    Result Date: 10/15/2019  FINDINGS/IMPRESSION: There is significant enlargement of the central vasculature with rapid distal tapering suggesting a component of pulmonary hypertension. . No evidence of edema. No consolidation. No acute process identified ReadingStation:[email or web address]      LAB STUDIES    All lab value have been personally reviewed by me    Results     ** No results found for the last 24 hours. **              EKG:     EKG:   Last EKG Result     None             ORDERS PLACED THIS VISIT     Orders  No orders of the defined types were placed in this encounter.      Medications  Medications   HYDROcodone-acetaminophen (NORCO) 5-325 MG per tablet 1 tablet (has no administration in time range)              Allergies & Medications:     Pt is allergic to codeine.    Current/Home Medications    ACETAMINOPHEN (TYLENOL) 500 MG TABLET    Take 500 mg by mouth every 6 (six) hours as needed  for Pain.    ALLOPURINOL (ZYLOPRIM) 300 MG TABLET    Take 300 mg by mouth daily.    AMIODARONE (PACERONE) 200 MG TABLET    Take 1 tablet (200 mg total) by mouth daily    AMOXICILLIN-CLAVULANATE (AUGMENTIN) 875-125 MG PER TABLET    Take 1 tablet by mouth 2 (two) times daily for 5 days    ATENOLOL (TENORMIN) 100 MG TABLET    Take 100 mg by mouth 2 (two) times daily    DIVALPROEX EC/DR TABLET (DEPAKOTE EC/DR) 250 MG EC TABLET    Take 500 mg by mouth 2 (two) times daily       EMPAGLIFLOZIN (JARDIANCE) 10 MG TABLET    Take 1 tablet (10 mg total) by mouth every morning    FERROUS SULFATE 325 (65 FE) MG TABLET    Take 325 mg by mouth every morning with breakfast    GABAPENTIN (NEURONTIN) 100 MG CAPSULE    Take 100 mg by mouth 2 (two) times daily.    HYDRALAZINE (APRESOLINE) 25 MG TABLET    Take 3 tablets (75 mg total) by mouth 3 (three) times daily    ISOSORBIDE MONONITRATE (IMDUR) 60 MG 24 HR TABLET    Take 1 tablet (60 mg total) by mouth daily    LOPERAMIDE HCL (IMODIUM PO)    Take by mouth daily as needed.    MULTIPLE VITAMIN (MULTIVITAMIN) CAPSULE    Take 1 capsule by mouth daily.    POTASSIUM CHLORIDE (K-DUR) 10 MEQ TABLET        PRAVASTATIN (PRAVACHOL) 40 MG TABLET    Take 40 mg by mouth every evening.    RIVAROXABAN (XARELTO) 15 MG TAB    Take 1 tablet (15 mg total) by mouth daily with dinner    SENNA CO    by Combination route daily as needed.    TAMSULOSIN (FLOMAX) 0.4 MG CAP    Take 0.4 mg by mouth daily.    TORSEMIDE (DEMADEX) 20 MG TABLET    Take 20 mg by mouth daily           Past History:     I personally reviewed past history  Medical:   Past Medical History:   Diagnosis Date   . Arrhythmia    . Arthritis    . Atrial fibrillation    . Atrial flutter    . Bipolar affective    . BPH (benign prostatic hyperplasia)    . Complication of anesthesia     resp. asessment   . Diabetes mellitus    . Diabetic neuropathy    . Gout    . Heart murmur    . HOH (hard of hearing)    . Hyperlipidemia    . Hypertension    .  Paroxysmal atrial fibrillation    . Pulmonary hypertension    . Renal insufficiency    . Type 2 diabetes mellitus, controlled    . Wears glasses        Surgical:   Past Surgical History:   Procedure Laterality Date   . APPENDECTOMY     . CARDIAC ABLATION     . CARDIAC CATHETERIZATION     . CARDIOVERSION      x 2   . FLUORO-NO CHARGE (HYBRID ROOM ONLY) N/A 11/22/2016    Procedure: FLUORO-NO CHARGE (HYBRID ROOM ONLY);  Surgeon: [name], MD;  Location: [facility] CATH/EP;  Service: Cardiovascular;  Laterality: N/A;   . HERNIA REPAIR     . PARACENTESIS  2011   . PARACENTESIS  06/22/2019   . PERICARDIOCENTESIS Left 11/25/2016    Procedure: PERICARDIOCENTESIS;  Surgeon: [name], MD;  Location: [facility] CATH/EP;  Service: Cardiovascular;  Laterality: Left;   . TEE     . TONSILLECTOMY     . TONSILLECTOMY, ADENOIDECTOMY         Family:   Family History   Problem Relation Age of Onset   . Hypertension Mother    . Diabetes Mother    . Heart disease Mother    . Hypertension Sister    . Diabetes Sister    . Hyperlipidemia Sister    . Hypertension Brother    . Heart disease Brother    . Hyperlipidemia Brother    . Hypertension Brother    . Diabetes Brother    . Hyperlipidemia Brother    . Kidney disease Brother    . Hypertension Daughter        Social:  reports that he quit smoking about 26 years ago. His smoking use included cigarettes. He has a 12.00 pack-year smoking history. He has never used smokeless tobacco. He reports that he does not drink alcohol or use drugs.        ATTESTATIONS     Marijo Sanes, PA-C    The results of diagnostic studies have been reviewed by myself. The above past medical, family, social, and surgical histories have been reviewed by myself. The clinical impression and plan have been discussed with the patient and/or the patient's family. All questions have been answered.    Note:  This chart was generated by an EMR and may contain errors, including typographical, or omissions not intended by  the user. This chart was generated by the Epic EMR system/speech recognition and may contain inherent errors or omissions not intended by the user. Grammatical errors, random word insertions, deletions, pronoun errors and incomplete sentences are occasional consequences of this technology due to software limitations. Not all errors are caught or corrected. If there are questions or concerns about the content of this note or information contained within the body of this dictation they should be addressed directly with the Chief Strategy Officer  for clarification          Sherral Hammers, [place]  10/15/19 Einar Crow       Karolee Stamps, MD  10/15/19 Curly Rim

## 2019-10-15 NOTE — Telephone Encounter (Signed)
I was called by the lab that the glucose was 756.  I attempted to call the patient and left a voicemail for him to contact me back about his labs.     10-13-19 at 2:25 PMI called the patient again and left another message for him to call me back about his labs.      Addendum:     Advised patient that his blood sugar was greater than 750 and he needed to be evaluated at Ed. He agreed to seek care.

## 2019-10-15 NOTE — ED Notes (Signed)
Medical student at the bedside

## 2019-10-15 NOTE — ED Notes (Signed)
Xray at the bedside.

## 2019-10-15 NOTE — ED Provider Notes (Signed)
[place]  History and Physical Exam       [name], [name]  Encounter Date:  10/15/2019  Physician Assistant: Melynda Keller, PA-C  Attending Physician: Dunn, Mali B, MD  PCP: [name], MD  Patient DOB:  06-Jul-1951  MRN:  [id number]  Room:  N3/N3-A    History of Presenting Illness     Chief complaint: Hyperglycemia    HPI/ROS given by: Patient    Triage note: Pt reports that his glucometer read HI this morning. BG 435 in triage  takes jardiance. BG 715 on 3/5. denies N/VD +dizziness +blurred vision    [name] is a 69 y.o. 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  Notes that he has had difficulty controlling his blood glucose over the past month since he was switched from Metformin and glipizide to Jardiance due to declining renal function.  Over the past few days his sugars been greater than 500.  He denies any illness, but notes that he has a sore on his back, and on exam appears to have an infected sebaceous cyst.     Review of Systems     Review of Systems   Constitutional: Negative for chills and fever.   Respiratory: Negative for shortness of breath.    Cardiovascular: Negative for chest pain.   Gastrointestinal: Negative for abdominal pain.   Endocrine: Positive for polydipsia and polyuria.   Skin: Positive for wound.   Neurological: Negative for speech difficulty.   Psychiatric/Behavioral: Negative for agitation.   All other systems reviewed and are negative.       Allergies & Medications     Pt is allergic to codeine.    Discharge Medication List as of 10/15/2019  3:37 PM      CONTINUE these medications which have NOT CHANGED    Details   acetaminophen  (TYLENOL) 500 MG tablet Take 500 mg by mouth every 6 (six) hours as needed for Pain., Historical Med      allopurinol (ZYLOPRIM) 300 MG tablet Take 300 mg by mouth daily., Until Discontinued, Historical Med      amiodarone (PACERONE) 200 MG tablet Take 1 tablet (200 mg total) by mouth daily, Starting Thu 08/10/2018, Normal      atenolol (TENORMIN) 100 MG tablet Take 100 mg by mouth 2 (two) times daily, Historical Med      divalproex EC/DR tablet (DEPAKOTE EC/DR) 250 MG EC tablet Take 500 mg by mouth 2 (two) times daily   , Historical Med      empagliflozin (Jardiance) 10 MG tablet Take 1 tablet (10 mg total) by mouth every morning, Starting Wed 07/04/2019, No Print      ferrous sulfate 325 (65 FE) MG tablet Take 325 mg by mouth every morning with breakfast, Historical Med      gabapentin (NEURONTIN) 100 MG capsule Take 100 mg by mouth 2 (two) times daily., Until Discontinued, Historical Med      hydrALAZINE (APRESOLINE) 25 MG tablet Take 3 tablets (75 mg total) by mouth 3 (three) times daily, Starting Fri 09/21/2019, E-Rx      isosorbide mononitrate (IMDUR) 60 MG 24 hr tablet Take 1 tablet (60 mg total) by mouth daily, Starting Thu 07/12/2019, E-Rx      Multiple Vitamin (MULTIVITAMIN)  capsule Take 1 capsule by mouth daily., Until Discontinued, Historical Med      potassium chloride (K-DUR) 10 MEQ tablet Starting Fri 09/21/2019, Historical Med      pravastatin (PRAVACHOL) 40 MG tablet Take 40 mg by mouth every evening., Historical Med      rivaroxaban (Xarelto) 15 MG Tab Take 1 tablet (15 mg total) by mouth daily with dinner, Starting Fri 06/29/2019, E-Rx      tamsulosin (FLOMAX) 0.4 MG Cap Take 0.4 mg by mouth daily., Until Discontinued, Historical Med      torsemide (DEMADEX) 20 MG tablet Take 20 mg by mouth daily, Historical Med      Loperamide HCl (IMODIUM PO) Take by mouth daily as needed., Historical Med      SENNA CO by Combination route daily as needed., Historical Med              Past Medical History     Pt has  a past medical history of Arrhythmia, Arthritis, Atrial fibrillation, Atrial flutter, Bipolar affective, BPH (benign prostatic hyperplasia), Complication of anesthesia, Diabetes mellitus, Diabetic neuropathy, Gout, Heart murmur, HOH (hard of hearing), Hyperlipidemia, Hypertension, Paroxysmal atrial fibrillation, Pulmonary hypertension, Renal insufficiency, Type 2 diabetes mellitus, controlled, and Wears glasses.     Past Surgical History     Pt  has a past surgical history that includes Appendectomy; Hernia repair; TONSILLECTOMY, ADENOIDECTOMY; CARDIAC ABLATION; Cardioversion; TEE; Cardiac catheterization; Tonsillectomy; Pericardiocentesis (Left, 11/25/2016); FLUORO-NO CHARGE (N/A, 11/22/2016); Paracentesis (2011); and Paracentesis (06/22/2019).     Family History     The family history includes Diabetes in his brother, mother, and sister; Heart disease in his brother and mother; Hyperlipidemia in his brother, brother, and sister; Hypertension in his brother, brother, daughter, mother, and sister; Kidney disease in his brother.     Social History     Pt reports that he quit smoking about 26 years ago. His smoking use included cigarettes. He has a 12.00 pack-year smoking history. He has never used smokeless tobacco. He reports that he does not drink alcohol or use drugs.     Physical Exam     Blood pressure 119/74, pulse 64, temperature 98.4 F (36.9 C), temperature source Skin, resp. rate 18, height 1.702 m, weight 71.6 kg, SpO2 97 %.    Constitutional: Well developed, well nourished, active, in no apparent distress.  HENT:   Head: Normocephalic, atraumatic  Ears: No external lesions.  Nose: No external lesions. No epistaxis or drainage.  Eyes: PERRL. No scleral icterus. No conjunctival injection. EOMI.  Neck: Trachea is midline. No JVD. Normal range of motion. No apparent masses.  Cardiovascular: Regular rhythm, S1 normal and S2 normal. No murmur heard.  Pulmonary/Chest: Effort normal. Lungs clear to auscultation  bilaterally.   Abdominal: Soft, non-tender, non-distended. No masses.   Genitourinay/Anorectal: Defferred  Musculoskeletal: Normal range of motion of extremities. No deformity or apparent injury.   Neurological: Pt is alert. Cranial nerves are grossly intact. Moving all extremities without apparent deficit.   Psychiatric: Affect is appropriate. There is no agitation.   Skin: Skin is warm, dry, well perfused. No rash noted.      Diagnostic Results     The results of the diagnostic studies below have been reviewed by myself:    Labs  Results     Procedure Component Value Units Date/Time    i-Stat Troponin [[id number]] Collected: 10/15/19 1502    Specimen: Blood Updated: 10/15/19 1523     Troponin I I-Stat <0.02 ng/mL  I-Stat Troponin Notification [[id number]] Collected: 10/15/19 1500    Specimen: ISTAT Updated: 10/15/19 1502     I-STAT Notification Istat Notification    Dextrose Stick Glucose [[id number]]  (Abnormal) Collected: 10/15/19 1439    Specimen: Blood Updated: 10/15/19 1456     Glucose POCT 253 mg/dL     Troponin I [[id number]]  (Abnormal) Collected: 10/15/19 1302    Specimen: Plasma Updated: 10/15/19 1425     Troponin I 0.03 ng/mL     Urinalysis with Microscopic [[id number]]  (Abnormal) Collected: 10/15/19 1324    Specimen: Urine, Random Updated: 10/15/19 1408     Color, UA Straw     Clarity, UA Clear     Urine Specific Gravity 1.011     pH, Urine 5.0 pH      Protein, UR Negative mg/dL      Glucose, UA >=500 mg/dL      Ketones UA Negative mg/dL      Bilirubin, UA Negative     Blood, UA Negative     Nitrite, UA Negative     Urobilinogen, UA Normal mg/dL      Leukocyte Esterase, UA Negative Leu/uL      UR Micro Performed     WBC, UA <1 /hpf      RBC, UA 1 /hpf     Basic Metabolic Panel [[id number]]  (Abnormal) Collected: 10/15/19 1302    Specimen: Plasma Updated: 10/15/19 1344     Sodium 132 mMol/L      Potassium 4.0 mMol/L      Chloride 96 mMol/L      CO2 25 mMol/L      Calcium 9.2 mg/dL      Glucose 420 mg/dL       Creatinine 1.86 mg/dL      BUN 37 mg/dL      Anion Gap 15.0 mMol/L      BUN / Creatinine Ratio 19.9 Ratio      EGFR 36 mL/min/1.77m2      Osmolality Calculated 291 mOsm/kg     Beta-Hydroxybutyrate [[id number]] Collected: 10/15/19 1302    Specimen: Plasma Updated: 10/15/19 1344     Betahydroxybutyrate 0.19 mMol/L     CBC and differential [[id number]]  (Abnormal) Collected: 10/15/19 1302    Specimen: Blood Updated: 10/15/19 1342     WBC 5.4 K/cmm      RBC 4.84 M/cmm      Hemoglobin 14.2 gm/dL      Hematocrit 43.6 %      MCV 90 fL      MCH 29 pg      MCHC 33 gm/dL      RDW 20.9 %      PLT CT 221 K/cmm      MPV 9.9 fL      Neutrophils % 74.4 %      Lymphocytes 11.0 %      Monocytes 10.9 %      Eosinophils % 2.8 %      Basophils % 0.9 %      Neutrophils Absolute 4.0 K/cmm      Lymphocytes Absolute 0.6 K/cmm      Monocytes Absolute 0.6 K/cmm      Eosinophils Absolute 0.2 K/cmm      Basophils Absolute 0.0 K/cmm     Dextrose Stick Glucose [[id number]]  (Abnormal) Collected: 10/15/19 1233    Specimen: Blood Updated: 10/15/19 1251     Glucose POCT 435 mg/dL           Radiologic Studies  Xr Chest Ap Portable    Result Date: 10/15/2019  FINDINGS/IMPRESSION: There is significant enlargement of the central vasculature with rapid distal tapering suggesting a component of pulmonary hypertension. . No evidence of edema. No consolidation. No acute process identified ReadingStation:[email or web address]      EKG:   Atrial fibrillation   LVH with secondary repolarization abnormality   Compared to ECG 08/30/2018 10:07:17   Left ventricular hypertrophy now present   Sinus bradycardia no longer present   First degree AV block no longer present   Electronically Signed On 10-15-2019 13:53:02 EST by Mali Dunn      ED Meds     ED Medication Orders (From admission, onward)    Start Ordered     Status Ordering Provider    10/15/19 1500 10/15/19 1428  amoxicillin-clavulanate (AUGMENTIN) 875-125 MG per tablet 1 tablet  Once in ED     Route: Oral  Ordered  Dose: 1 tablet     Last MAR action: Given Pang Robers    10/15/19 1352 10/15/19 1351  insulin regular (HumuLIN R) injection 6 Units  Once in ED     Route: Intravenous  Ordered Dose: 6 Units     Last MAR action: Given Kennette Cuthrell    10/15/19 1240 10/15/19 1239  sodium chloride 0.9 % bolus 1,000 mL  Once in ED     Route: Intravenous  Ordered Dose: 1,000 mL     Last MAR action: [place], Mali B           ED Course and Medical Decision Making     Old medical records and nursing/triage notes were reviewed by myself    DIAGNOSTIC CONSIDERATIONS    Hyperglycemia secondary to medication noncompliance/inadequate dosing, DKA, HHS    CONSULTS      ED COURSE & MDM    Patient evaluated for acute hyperglycemia in the context of recently switching from Metformin and glipizide to Jardiance.  No evidence of DKA.  He does appear to have an infected sebaceous cyst on the upper back which was incised and drained at the bedside.  He will be started on Augmentin.  He was given IV fluids and insulin with improvement in blood glucose.  He will keep a close eye on his blood sugar and plans to follow-up with his PCP in the next 24 to 48 hours regarding Jardiance dosing and potential need for additional medication.  He will need packing change/removal in 48 hours and will either follow-up with his PCP or here within that timeframe.    In addition to the above history, please see nursing notes. Allergies, meds, past medical, family, social hx, and the results of the diagnostic studies performed have been reviewed by myself.    This chart was generated by an EMR and may contain errors or omissions not intended by the user.     Procedures / Critical Care     INCISION and DRAINAGE   By Melynda Keller, PA  4:29 PM    Indication: abscess  Location: Mid back  Incision: single (3 cm)  Packing: yes    Verbal consent.  Patient identified and location of wound verified.  Appropriate sterile precautions observed using gloves and sterile drape.   Lidocaine 1% with epi was given locally.  Incision made with #11-blade.  Pus expressed.  Hemostat used probe for extensions and loculations. Wound bandaged. Patient tolerated procedure well with no complications.     Diagnosis / Disposition     Clinical Impression  1.  Acute hyperglycemia    2. Infected sebaceous cyst        Disposition  ED Disposition     ED Disposition Condition Date/Time Comment    Discharge  Mon Oct 15, 2019  3:32 PM Therman [facility] discharge to home/self care.    Condition at disposition: Stable          Follow up for Discharged Patients  [name], MD  [address]  [place] [address]  [phone]    In 2 days  For wound re-check      Prescriptions for Discharged Patients  Discharge Medication List as of 10/15/2019  3:37 PM      START taking these medications    Details   amoxicillin-clavulanate (AUGMENTIN) 875-125 MG per tablet Take 1 tablet by mouth 2 (two) times daily for 5 days, Starting Mon 10/15/2019, Until Sat 10/20/2019, E-Rx                     Melynda Keller, PA  10/15/19 1629       Dunn, Mali B, MD  10/16/19 [phone]

## 2019-10-15 NOTE — Discharge Instructions (Signed)
High Blood Sugar (Hyperglycemia)  Too much sugar (glucose) in your blood is called high blood sugar (hyperglycemia). This can lead to a dangerous condition called ketoacidosis. In severe cases, it can lead to fluid loss (dehydration) and coma.   Possible causes of high blood sugar   · Having a poor treatment plan for diabetes   · Being sick  · Being under stress  · Taking certain medicines, such as steroids  · Eating too much food, especially carbohydrates  · Being less active than normal  · Not taking enough diabetes medicine    Symptoms of high blood sugar   High blood sugar may not cause symptoms. If you do have symptoms, they may include:   · Thirst  · Frequent need to urinate  · Feeling tired or drowsy  · Nausea and vomiting  · Itchy, dry skin  · Blurry vision  · Fast breathing and breath that smells fruity   · Weakness  · Dizziness  · Wounds or skin infections that don’t heal  · Unexplained weight loss if hyperglycemia lasts for more than a few days   What to do   Do the following:   · Check your blood sugar.  · Drink plenty of sugar-free, caffeine-free liquids such as water. Don’t drink fruit juice.  · Check your blood sugar again every 4 hours. If you take insulin or diabetes medicines, follow your sick-day plan for taking medicine. Call your healthcare provider if you are not able to eat.  · Check your blood or urine for ketones as directed.  · Call your provider if your blood sugar and ketones don't go back to your target range.  · If the value is high, take your diabetes medicines as prescribed. And check your blood sugar more often. Doses of medicines such as insulin can be increased slightly if blood sugars stay high. But your provider must approve this.     When you have hyperglycemia, drink plenty of water or other sugar-free, caffeine-free liquids.   Preventing high blood sugar  To help keep your blood sugar from getting too high:  · Control stress.  · When you're ill, follow your sick-day plan.    · Follow your meal plan. Eat only the amount of food on your meal plan.  · Stick to your exercise plan.  · Take your insulin or diabetes medicines as directed by your healthcare team. Also test your blood sugar as directed. If the plan is not working for you, discuss it with your healthcare provider.  Other things to do  · Carry a medical ID card or a compact USB drive. Or wear a medical alert bracelet or necklace. It should say that you have diabetes. It should also say what to do in case you pass out or go into a coma.  · Make sure family, friends, and coworkers know the signs of high blood sugar. Tell them what to do if your blood sugar gets very high and you can’t help yourself.  · Talk with your healthcare team about other things you can do to prevent high blood sugar.       Special note: Drink plenty of sugar-free and caffeine-free liquids when you feel symptoms of hyperglycemia. Call your healthcare provider if you keep having episodes of high blood sugar.   [employer] last reviewed this educational content on 06/09/2017  © 2000-2020 [employer] [address], [place] [address]. All rights reserved. This information is not intended as a substitute for professional medical care.   Always follow your healthcare professional's instructions.

## 2019-10-19 ENCOUNTER — Encounter: Payer: Self-pay | Admitting: Family Nurse Practitioner

## 2019-10-19 ENCOUNTER — Ambulatory Visit
Admission: RE | Admit: 2019-10-19 | Discharge: 2019-10-19 | Disposition: A | Discharge status: Home / Self Care | Payer: No Typology Code available for payment source | Source: Ambulatory Visit | Attending: Family Nurse Practitioner | Admitting: Family Nurse Practitioner

## 2019-10-19 VITALS — BP 134/67 | HR 60 | Wt 150.0 lb

## 2019-10-19 DIAGNOSIS — I5032 Chronic diastolic (congestive) heart failure: Secondary | ICD-10-CM

## 2019-10-19 DIAGNOSIS — Z7901 Long term (current) use of anticoagulants: Secondary | ICD-10-CM

## 2019-10-19 DIAGNOSIS — N189 Chronic kidney disease, unspecified: Secondary | ICD-10-CM

## 2019-10-19 DIAGNOSIS — I4819 Other persistent atrial fibrillation: Secondary | ICD-10-CM

## 2019-10-19 DIAGNOSIS — I10 Essential (primary) hypertension: Secondary | ICD-10-CM

## 2019-10-19 DIAGNOSIS — N184 Chronic kidney disease, stage 4 (severe): Secondary | ICD-10-CM

## 2019-10-19 NOTE — Progress Notes (Addendum)
[place]      Advanced Heart Failure Established Visit - Telehealth    [name]   Date of Birth: [birth year]    Provider: Belva Agee, NP     Patient Care Team:  [name], MD as PCP - General (Family Medicine)  [name], DO as Consulting Physician (Cardiology)  [name], MD as Consulting Physician (Thoracic and Cardiac Surgery)    Chief Complaint:      Follow up     HPI:  I had the pleasure of speaking to [name] today from  [facility] for an advanced heart failure new appointment for hospital follow up.      This is a telehealth visit which was conducted with the use of interactive HIPPA compliant audio only, video option unavailable to patient telecommunication that permitted real time communication between [name] and myself. He consented to participation and received services at home, while I was located at the [facility] at [facility]. This visit was changed from an in-person visit to a telehealth visit to lower the risk of exposure and / or spread of the current pandemic with the SARS CoV-2 virus. This is based on the guidelines from the [facility] and other health agencies.    Blood pressures at home are better.  He had an elevated blood sugar at home that was 756. Our office called him on Monday and he presented to Ed.  His blood sugar was lower but he was noted to a  cyst.  He has had two ed visits due to cysts. He has also seen his primary care provider for follow up. He is now scheduled for a procedure with general surgery on Monday.      He states blood sugars are in 250s before eating. He has reviewed his meter readings with provider and his numbers are improving.     Reporting stable weights at home he is feeling better.  Will soon receive cpap therapy.      [name] is a 69 y.o. male with HFpEF, NYHA Class 3,  ACC/AHA.  Also has a history of paroxysmal atrial fibrillation status post ablation, diabetes, chronic kidney disease stage III-IV, hypertension hyperlipidemia bipolar disorder. From a cardiovascular standpoint, [name] states has not done well.      He denies any fever, chills, night sweats, lightheadedness, dizziness, presyncope or syncope, shortness of breath at rest, orthopnea, PND, increasing fatigue or decreasing exercise tolerance. He has had no chest pain or pressure.  His appetite has been stable and he denies any abdominal bloating or distention, early satiety, anorexia, nausea, vomiting or changes in bowel or bladder habits.     [name] that he has not been exercising.  He does check blood pressures at home.   He has been checking morning weights; his weight has been stable and slightly declined since discharge.  [name] a fluid restriction of 2 L/day and has restricting dietary sodium content to no more than 2 gm/day.  He [name] that he has been compliant with his medications.      Weight:   Wt Readings from Last 3 Encounters:   10/19/19 68 kg (150 lb)   10/15/19 71.6 kg (157 lb 13.6 oz)   10/15/19 71.6 kg (157 lb 13.6 oz)       Review of Systems:    Comprehensive 12 point review of  system is negative unless described above in HPI        Medical Problems:  Patient Active Problem List    Diagnosis Date Noted   . Diastolic CHF, acute 28/76/8115   . Nonrheumatic aortic valve stenosis 02/26/2019   . Follow-up exam 01/26/2017   . 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 Chronic anticoagulation 11/25/2016   . Stroke 11/25/2016   . CKD (chronic kidney disease) 11/25/2016   . Former smoker, stopped smoking many years ago 11/25/2016   . Type 2 diabetes mellitus 11/25/2016   . HTN (hypertension) 11/25/2016   . HLD (hyperlipidemia) 11/25/2016   . Diabetic neuropathy 11/25/2016   . COPD (chronic obstructive pulmonary disease) 11/25/2016   . Bipolar disorder 11/25/2016   . BPH (benign  prostatic hyperplasia) 11/25/2016   . Atrial fibrillation 11/22/2016   . Chronic atrial fibrillation 10/08/2016   . Stage 4 chronic kidney disease 10/08/2016   . Persistent atrial fibrillation 10/07/2016   . Fatty liver 10/07/2016     Past Medical History:  1. Atrial fibrillation  A. Cardioversion 10/22/11.   B. Echo 10/22/11- moderate-severe concentric LVH. Biatrial enlargement. Mild AS, dilated inferior vena cava, small pericardial effusion. Peak velocity across the AV at 2.2 meters/s.  C. Cardiac abltation 01/13/15.   D. MRI Cardiac 03/16/16- LVEF 68.4%. Moderate septal wall hypertrophy (1.6cm) and mild posterior wall hypertrophy (1.3cm). Focal area of mid-myocardial wall delayed enhancement in basal anteroseptal wall and RV insertion point consistent with hypertensive heart disease. RVEF 41.7%. Severe LA enlargement and moderate RA enlargement. 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 Mild AS with peak velocity 2.60m/s. Mild central AR. Mild MR and TR. Small to moderate pericardial effusion. Mildly dilated ascending aorta. Aortic arch and descending thoracic aorta are ectatic. Trivial right pleural effusion. Dilated central pulm artery at 4.3cm.   E. s/p TEE and [place] cardioversion 06/04/16  F. Convergent AF Ablation 11/22/16 - Procedure was complicated with pericardial effusion/ tamponade requiring pericardiocentesis. He developed left pleural effusion with thoracentesis.   G. Echo 12/03/16- No pericardial effusion, aortic valve is severely calcified with severely restricted systolic opening, mild regurgitation, LV is normal in size with moderate to severe concentric hypertrophy, systolic function is preserved with an EF 72-62%, diastolic dysfunction that couldn't be graded.   H. Echo 12/08/17- EF 60-65%. Moderate concentric LV hypertrophy. LA and RA severely dilated. Mild MR and moderate TR. Moderate valvular aortic stenosis. Mild to moderate AR and pulmonic valvular  regurgitation. Mildly dilated ascending aorta 3.8-4.2 cm.   I. Echo 01/29/19- Moderate to severe concentric LVH, EF 03-55%, grade 2 diastolic dysfunction of the LV (with evidence of increased left atrial pressure), RV size is moderately increased, RV hypertrophy, RV systolic function is mild to moderately decreased, proximal ascending aorta is mildly dilated at 4.2cm. moderate aortic stenosis, mild to moderate insufficiency, mitral valve regurgitation is mild, tricuspid regurgitation is moderate, estimated pulm arterial systolic pressure is severely elevated, 78mmHg, pulm valve regurgitation is mild to moderate.   J. Echo 06/21/2019  ef 65-70%. right ventricular size is mildy increased. The right ventricular systolic function is mildly decreased. Moderate aortic stenosis with peak aortic valve velocity 3.5 m/s and mean pressure gradient of 23 mmHg. Mild to moderate aortic regurgitation. Mild mitral regurgitation.  Moderate tricuspid regurgitation.  Estimated RVSP 93 mmHg.     2. Aorta status  A. CTA Chest 07/11/18- no significant change and type B aortic dissection with mostly thrombosed false lumen  with stable fusiform aneurysmal dilatation. (Again there is small area of persistent perfusion of the aneurysm sac which appears to have diminished since prior exam and may be related to partial thrombosis versus technique). At subsequent follow-up, would encourage delayed evaluation in addition to arterial phase evaluation. Maximal aneurysmal dilatation involving the descending segment measures 45 x 39 mm.     3. Hypertension.  4. Hyperlipidemia.   5. Type I Diabetes Mellitus.  6. Bipolar disorder.  7. Fatty liver.  8. Renal insufficiency.  A. Normal renal ultrasound October 23, 2011.  9. Basilar Airway disease  Past Medical History:   Diagnosis Date   . Arrhythmia    . Arthritis    . Atrial fibrillation    . Atrial flutter    . Bipolar affective    . BPH (benign prostatic hyperplasia)    . Complication of anesthesia      resp. asessment   . Diabetes mellitus    . Diabetic neuropathy    . Gout    . Heart murmur    . HOH (hard of hearing)    . Hyperlipidemia    . Hypertension    . Paroxysmal atrial fibrillation    . Pulmonary hypertension    . Renal insufficiency    . Type 2 diabetes mellitus, controlled    . Wears glasses      Past Surgical History:   has a past surgical history that includes Appendectomy; Hernia repair; TONSILLECTOMY, ADENOIDECTOMY; CARDIAC ABLATION; Cardioversion; TEE; Cardiac catheterization; Tonsillectomy; Pericardiocentesis (Left, 11/25/2016); FLUORO-NO CHARGE (N/A, 11/22/2016); Paracentesis (2011); and Paracentesis (06/22/2019).  Family History:  family history includes Diabetes in his brother, mother, and sister; Heart disease in his brother and mother; Hyperlipidemia in his brother, brother, and sister; Hypertension in his brother, brother, daughter, mother, and sister; Kidney disease in his brother.  Social History:   [name] that he quit smoking about 26 years ago. His smoking use included cigarettes. He has a 12.00 pack-year smoking history. He has never used smokeless tobacco. He [name] that he does not drink alcohol or use drugs.     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  Medications:    Current Outpatient Medications:   .  acetaminophen (TYLENOL) 500 MG tablet, Take 500 mg by mouth every 6 (six) hours as needed for Pain., Disp: , Rfl:   .  allopurinol (ZYLOPRIM) 300 MG tablet, Take 300 mg by mouth daily., Disp: , Rfl:   .  amiodarone (PACERONE) 200 MG tablet, Take 1 tablet (200 mg total) by mouth daily, Disp: 90 tablet, Rfl: 1  .  atenolol (TENORMIN) 100 MG tablet, Take 100 mg by mouth 2 (two) times daily, Disp: , Rfl:   .  divalproex EC/DR tablet (DEPAKOTE EC/DR) 250 MG EC tablet, Take 500 mg by mouth 2 (two) times daily  , Disp: , Rfl:   .   empagliflozin (Jardiance) 10 MG tablet, Take 1 tablet (10 mg total) by mouth every morning, Disp:  , Rfl:   .  ferrous sulfate 325 (65 FE) MG tablet, Take 325 mg by mouth every morning with breakfast, Disp: , Rfl:   .  gabapentin (NEURONTIN) 100 MG capsule, Take 100 mg by mouth 2 (two) times daily., Disp: , Rfl:   .  hydrALAZINE (APRESOLINE) 25 MG tablet,  Take 3 tablets (75 mg total) by mouth 3 (three) times daily, Disp: 270 tablet, Rfl: 11  .  isosorbide mononitrate (IMDUR) 60 MG 24 hr tablet, Take 1 tablet (60 mg total) by mouth daily, Disp: 30 tablet, Rfl: 11  .  Loperamide HCl (IMODIUM PO), Take by mouth daily as needed., Disp: , Rfl:   .  metFORMIN (GLUCOPHAGE) 500 MG tablet, Take 500 mg by mouth 2 (two) times daily with meals, Disp: , Rfl:   .  Multiple Vitamin (MULTIVITAMIN) capsule, Take 1 capsule by mouth daily., Disp: , Rfl:   .  potassium chloride (K-DUR) 10 MEQ tablet, , Disp: , Rfl:   .  pravastatin (PRAVACHOL) 40 MG tablet, Take 40 mg by mouth every evening., Disp: , Rfl:   .  rivaroxaban (Xarelto) 15 MG Tab, Take 1 tablet (15 mg total) by mouth daily with dinner, Disp: 30 tablet, Rfl: 11  .  SENNA CO, by Combination route daily as needed., Disp: , Rfl:   .  tamsulosin (FLOMAX) 0.4 MG Cap, Take 0.4 mg by mouth daily., Disp: , Rfl:   .  torsemide (DEMADEX) 20 MG tablet, Take 20 mg by mouth daily, Disp: , Rfl:     Vitals:    Visit Vitals  BP 134/67   Pulse 60   Wt 68 kg (150 lb)   BMI 23.49 kg/m     Weight is stable per his report.       Physical exam was not completed at this time due to Covid 19      Laboratory Data:  Lab Results   Component Value Date/Time    WBC 5.4 10/15/2019 01:02 PM    RBC 4.84 10/15/2019 01:02 PM    HGB 14.2 10/15/2019 01:02 PM    HCT 43.6 10/15/2019 01:02 PM    PLT 221 10/15/2019 01:02 PM    TSH 4.57 (H) 06/14/2019 11:56 AM     Lab Results   Component Value Date/Time    NA 132 (L) 10/15/2019 01:02 PM    K 4.0 10/15/2019 01:02 PM    CL 96 (L) 10/15/2019 01:02 PM    CO2 25  10/15/2019 01:02 PM    GLU 420 (H) 10/15/2019 01:02 PM    BUN 37 (H) 10/15/2019 01:02 PM    CREAT 1.86 (H) 10/15/2019 01:02 PM    PROT 7.2 06/20/2019 01:38 PM    ALKPHOS 140 06/20/2019 01:38 PM    AST 18 06/20/2019 01:38 PM    ALT 11 06/20/2019 01:38 PM     Lab Results   Component Value Date/Time    CHOL 70 (L) 11/26/2016 03:02 AM    TRIG 78 11/26/2016 03:02 AM    HDL 21 (L) 11/26/2016 03:02 AM    LDL 33 11/26/2016 03:02 AM     Lab Results   Component Value Date/Time    HGBA1CPERCNT 6.5 11/26/2016 03:02 AM     Lab Results   Component Value Date/Time    BNP 1,041.5 (H) 11/15/2016 11:10 AM     Assessment and Plan:  1. Chronic diastolic heart failure     2. Persistent atrial fibrillation     3. Chronic anticoagulation     4. Chronic kidney disease, unspecified CKD stage     5. Essential hypertension     6. Stage 4 chronic kidney disease           My impression is that, [name] has HFpEF.  Appears to be doing well on his current regimen  of diuretics.   Recommend continuing low-dose torsemide. Could take extra tablet if needed for weight gain.  Bp better controlled.      Return to clinic in 6 weeks.     Counseling:  -I have encouraged pt to continue to follow a <2gm/day Na restriction and limit fluid intake to < 2L/day.  -I have requested pt perform daily wts and contact our office for increase of >2-3lbs in 1 day or 5lbs in 1 week.   -I have encouraged pt to engage in aerobic activity as tolerated and avoid heavy weight lifting.   -Smoking/Alcohol: I have advised pt to abstain from any tobacco use and excessive alcohol intake.    -Weight: Obesity is a significant risk for both short and long-term morbidity and mortality for AHF patients.  I have stressed the importance of weight control and dietary management.     Our office is available to answer any questions you or the patient may have. We appreciate the opportunity to participate in the care of your patients.      Electronically signed by:    Norval Gable. Abigail Butts,  FNP-C  Clinical Coordinator for Advance Heart Failure and [facility]  [address]  [place], [place] [address]  [phone]   [phone] - portable phone  [phone] - fax         Note: This chart was generated by the [facility] EMR system/speech recognition and may contain inherit omission or errors not intended by the user.  Grammatical errors, random word insertions, deletions, pronoun errors and incomplete sentences are occasionally consequences of this technology due to software limitations.  Not all errors are caught or corrected.  If there are questions or concerns about the content of this note or information contained in the body of this dictation they should be addressed directly with the author for clarification.

## 2019-10-19 NOTE — Addendum Note (Signed)
Encounter addended by: Belva Agee, NP on: 10/19/2019 1:15 PM   Actions taken: Clinical Note Signed

## 2019-10-24 ENCOUNTER — Encounter: Payer: Self-pay | Admitting: Pulmonary Disease

## 2019-10-24 ENCOUNTER — Encounter: Payer: Self-pay | Admitting: Medical

## 2019-10-24 NOTE — Telephone Encounter (Signed)
Please review EKG from ED visit on 10/15/2019. Thanks!    Kandis Cocking, RN

## 2019-10-24 NOTE — Progress Notes (Signed)
Spoke with pt to make him aware that apap rx has been sent to [facility] for set up on 10/19/19, pt is aware he is to call as soon as device is received so a fu apptmnt can be made. as

## 2019-10-25 ENCOUNTER — Telehealth: Payer: Self-pay | Admitting: Physician Assistant

## 2019-10-25 DIAGNOSIS — I4819 Other persistent atrial fibrillation: Secondary | ICD-10-CM

## 2019-10-25 NOTE — Telephone Encounter (Signed)
Call to patient to discuss return of atrial fibrillation noted on recent EKG. Had to leave a message for him to return my call. [place], [place]

## 2019-10-25 NOTE — Telephone Encounter (Signed)
Talked with patient about the return of his atrial fibrillation. He has no symptom concerns. Discussed with Dr. [name]. Continue with rate control strategy. Stop Amiodarone. Repeat EKG in one week to assess QT. [place], [place]

## 2019-11-02 ENCOUNTER — Ambulatory Visit: Payer: No Typology Code available for payment source

## 2019-11-02 DIAGNOSIS — I480 Paroxysmal atrial fibrillation: Secondary | ICD-10-CM

## 2019-11-02 NOTE — Progress Notes (Signed)
Pt was advised to come into the office per [place], [place] for an EKG. EKG performed. AFIB, HR 70. QT measured at 440. QTC measured at 421. Okay for pt to leave. Per [place], [place] pt is to stay off of amiodarone at this time.     Thank you!  Brita Romp, RN

## 2019-11-22 ENCOUNTER — Telehealth: Payer: Self-pay

## 2019-11-22 NOTE — Telephone Encounter (Signed)
Requested ov, labs from pcp

## 2019-11-26 NOTE — Telephone Encounter (Signed)
2nd request

## 2019-11-26 NOTE — Telephone Encounter (Signed)
NOTE RECEIVED AND SCANNED TO 04.19.21 SCAN ONLY, LABS ALREADY IN 12.18.20 SCAN ONLY

## 2019-11-27 NOTE — Progress Notes (Signed)
Most recent labs- 10/22/19 See below   LOV- 08/29/19 W/ Dr. [name]   ED Visit 10/15/19- Sebaceous cyst     Past Medical History 11/28/19 LLB-     - Atrial fibrillation   A. Cardioversion 10/22/11.   B. Echo 10/22/11- moderate-severe concentric LVH. Biatrial enlargement. Mild AS, dilated inferior vena cava, small pericardial effusion. Peak velocity across the AV at 2.2 meters/s.   C. Cardiac abltation 01/13/15.   D. MRI Cardiac 03/16/16- LVEF 68.4%. Moderate septal wall hypertrophy (1.6cm) and mild posterior wall hypertrophy (1.3cm). Focal area of mid-myocardial wall delayed enhancement in basal anteroseptal wall and RV insertion point consistent with hypertensive heart disease. RVEF 41.7%. Severe LA enlargement and moderate RA enlargement. 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 Mild AS with peak velocity 2.75m/s. Mild central AR. Mild MR and TR. Small to moderate pericardial effusion. Mildly dilated ascending aorta. Aortic arch and descending thoracic aorta are ectatic. Trivial right pleural effusion. Dilated central pulm artery at 4.3cm.   E. s/p TEE and [place] cardioversion 06/04/16   F. Convergent AF Ablation 11/22/16 - Procedure was complicated with pericardial effusion/ tamponade requiring pericardiocentesis. Developed left pleural effusion with thoracentesis.   G. Echo 12/03/16- Aortic valve is severely calcified with severely restricted systolic opening, mild regurgitation, LV is normal in size with moderate to severe concentric hypertrophy, systolic function is preserved with an EF 55-60%.   H. Echo 12/08/17- EF 60-65%. Moderate concentric LVH. LA and RA severely dilated. Mild MR and moderate TR. Mod aortic stenosis. Mild to moderate AR and pulmonic valvular regurgitation. Mildly dilated ascending aorta 3.8-4.2 cm.   I. Echo 01/29/19- Moderate to severe concentric LVH, EF 16-01%, grade 2 diastolic dysfunction of the LV (with evidence of increased LA pressure), RV size  is moderately increased, RV hypertrophy, RV systolic function is mild to mod decreased, proximal ascending aorta is mildly dilated at 4.2cm. mod aortic stenosis, mild to mod insufficiency, mitral valve regurgitation is mild, tricuspid regurgitation is moderate, estimated pulm arterial systolic pressure is severely elevated, 83mmHg, pulm valve regurgitation is mild to moderate.   J. Echo 06/21/19- LV cavity size is normal. There is a flattened septum in systole and diastole consistent \\w / RV pressure and volume overload. EF 65-70%. LV walls are upper normal in thickness. RV size is mildy increased. RV systolic function is mildly decreased. Mod aortic stenosis w/ peak aortic valve velocity 3.5 m/s and mean pressure gradient of 23 mmHg. Mild to mod aortic regurgitation. Mild mitral regurgitation. Mod tricuspid regurgitation. RVSP 93 mmHg.     - Aortic dissection, thoracic   A. 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 (Again there is small area of persistent perfusion of the aneurysm sac which appears to have diminished since prior exam and may be related to partial thrombosis versus technique). At subsequent follow-up, would encourage delayed evaluation in addition to arterial phase evaluation. Maximal aneurysmal dilatation involving the descending segment measures 45 x 39 mm.     - Moderate aortic stenosis   - Acute diastolic CHF   - CVA   A. MRI Brain 09/26/19- No acute intracranial findings, Mild chronic white matter small vessel ischemic changes with patchy areas of volume loss and subcortical gliosis in the paramedian left frontoparietal region could be due to old trauma or ischemic changes Generalized cerebral and cerebellar atrophy, more than expected for age, with concordant compensatory enlargement of the ventricles.     -  Hypertension   - Hyperlipidemia   - Pulm HTN   - Type I Diabetes Mellitus   - Bipolar disorder   - Fatty  liver   - Renal insufficiency   A. Normal renal ultrasound October 23, 2011.     - Ascites   A. 06/22/19- Successful ultrasound-guided paracentesis.     - Basilar Airway disease     Labs 10/22/19- NA 137, K 4.3, CA 9.6, GLUC 204, BUN 20, CREAT 1.29, GFR 56

## 2019-11-28 ENCOUNTER — Encounter: Payer: Self-pay | Admitting: Medical

## 2019-11-28 ENCOUNTER — Ambulatory Visit: Payer: No Typology Code available for payment source | Admitting: Medical

## 2019-11-28 VITALS — BP 142/84 | HR 67 | Ht 67.0 in | Wt 159.7 lb

## 2019-11-28 DIAGNOSIS — I71019 Dissection of thoracic aorta, unspecified: Secondary | ICD-10-CM

## 2019-11-28 DIAGNOSIS — I10 Essential (primary) hypertension: Secondary | ICD-10-CM

## 2019-11-28 DIAGNOSIS — I350 Nonrheumatic aortic (valve) stenosis: Secondary | ICD-10-CM

## 2019-11-28 DIAGNOSIS — E782 Mixed hyperlipidemia: Secondary | ICD-10-CM

## 2019-11-28 DIAGNOSIS — I4819 Other persistent atrial fibrillation: Secondary | ICD-10-CM

## 2019-11-28 NOTE — Progress Notes (Signed)
Cardiology Follow-Up      [name]   Date of Birth: 1951/03/29    Provider: Linden Dolin, PA     Patient Care Team:  [name], MD as PCP - General (Family Medicine)  [name], DO as Consulting Physician (Cardiology)  [name], MD as Consulting Physician (Thoracic and Cardiac Surgery)    Chief Complaint: Atrial Fibrillation      History of Present Illness   [name] is a 69 y.o. male being seen today for follow up.     He has a history of atrial fibrillation.He is S/p ablation in 2016 and s/pconvergentAF ablation 11/22/16. He has been maintained on amiodarone.    He thinks he went back into afib in February. Initially he thought he was in and out of it. He went to ER with elevated blood sugars and a sebaceous cyst that was lanced.  He sent a MyChart message to review his EKG from the ER which did show that he was back in atrial fibrillation.  Was concerned about his T waves/QT prolongation so after review with Dr. [name], we elected to stop his amiodarone.    He says that he is aware of the atrial fibrillation.  Generally feels fatigued.  He still trying hard to stay active.     His fluid status has been stable.  Weights are stable.  No orthopnea or PND.  No edema.    He is following at the wound care clinic for his sebaceous cyst.     TTE 06/21/2019 noted for LVEF 65-70%, flattened septum in systole and diastole consistent with RV pressure and volume overload. RV systolic function mildly decreased. Moderate AS with peak aortic valve velocity 3.5 m/s and mean pressure gradient of 23 mmHg, mild to moderate AR, mild MR, moderate TR- estimated RVSP 93 mmHg.     Review of Systems     Comprehensive review of systems performed.Other than noted in HPI , all systems are negative.    Past Medical History   Most recent labs- 10/22/19 See below   LOV- 08/29/19 W/ Dr. [name]   ED Visit 10/15/19- Sebaceous cyst     Past Medical History 11/28/19 LLB-     In  documentation     Most recent labs- 10/22/19 See below   LOV- 08/29/19 W/ Dr. [name]   ED Visit 10/15/19- Sebaceous cyst     Past Medical History 11/28/19 LLB-     - Atrial fibrillation   A. Cardioversion 10/22/11.   B. Echo 10/22/11- moderate-severe concentric LVH. Biatrial enlargement. Mild AS, dilated inferior vena cava, small pericardial effusion. Peak velocity across the AV at 2.2 meters/s.   C. Cardiac abltation 01/13/15.   D. MRI Cardiac 03/16/16- LVEF 68.4%. Moderate septal wall hypertrophy (1.6cm) and mild posterior wall hypertrophy (1.3cm). Focal area of mid-myocardial wall delayed enhancement in basal anteroseptal wall and RV insertion point consistent with hypertensive heart disease. RVEF 41.7%. Severe LA enlargement and moderate RA enlargement. 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 Mild AS with peak velocity 2.15m/s. Mild central AR. Mild MR and TR. Small to moderate pericardial effusion. Mildly dilated ascending aorta. Aortic arch and descending thoracic aorta are ectatic. Trivial right pleural effusion. Dilated central pulm artery at 4.3cm.   E. s/p TEE and Candor cardioversion 06/04/16   F. Convergent AF Ablation 11/22/16 - Procedure was complicated with pericardial effusion/ tamponade requiring pericardiocentesis. Developed left pleural effusion with  thoracentesis.   G. Echo 12/03/16- Aortic valve is severely calcified with severely restricted systolic opening, mild regurgitation, LV is normal in size with moderate to severe concentric hypertrophy, systolic function is preserved with an EF 55-60%.   H. Echo 12/08/17- EF 60-65%. Moderate concentric LVH. LA and RA severely dilated. Mild MR and moderate TR. Mod aortic stenosis. Mild to moderate AR and pulmonic valvular regurgitation. Mildly dilated ascending aorta 3.8-4.2 cm.   I. Echo 01/29/19- Moderate to severe concentric LVH, EF 78-67%, grade 2 diastolic dysfunction of the LV (with evidence of increased LA  pressure), RV size is moderately increased, RV hypertrophy, RV systolic function is mild to mod decreased, proximal ascending aorta is mildly dilated at 4.2cm. mod aortic stenosis, mild to mod insufficiency, mitral valve regurgitation is mild, tricuspid regurgitation is moderate, estimated pulm arterial systolic pressure is severely elevated, 65mmHg, pulm valve regurgitation is mild to moderate.   J. Echo 06/21/19- LV cavity size is normal. There is a flattened septum in systole and diastole consistent \\w / RV pressure and volume overload. EF 65-70%. LV walls are upper normal in thickness. RV size is mildy increased. RV systolic function is mildly decreased. Mod aortic stenosis w/ peak aortic valve velocity 3.5 m/s and mean pressure gradient of 23 mmHg. Mild to mod aortic regurgitation. Mild mitral regurgitation. Mod tricuspid regurgitation. RVSP 93 mmHg.     - Aortic dissection, thoracic   A. 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 (Again there is small area of persistent perfusion of the aneurysm sac which appears to have diminished since prior exam and may be related to partial thrombosis versus technique). At subsequent follow-up, would encourage delayed evaluation in addition to arterial phase evaluation. Maximal aneurysmal dilatation involving the descending segment measures 45 x 39 mm.     - Moderate aortic stenosis   - Acute diastolic CHF   - CVA   A. MRI Brain 09/26/19- No acute intracranial findings, Mild chronic white matter small vessel ischemic changes with patchy areas of volume loss and subcortical gliosis in the paramedian left frontoparietal region could be due to old trauma or ischemic changes Generalized cerebral and cerebellar atrophy, more than expected for age, with concordant compensatory enlargement of the ventricles.     - Hypertension   - Hyperlipidemia   - Pulm HTN   - Type I Diabetes Mellitus   - Bipolar  disorder   - Fatty liver   - Renal insufficiency   A. Normal renal ultrasound October 23, 2011.     - Ascites   A. 06/22/19- Successful ultrasound-guided paracentesis.     - Basilar Airway disease     Labs 10/22/19- NA 137, K 4.3, CA 9.6, GLUC 204, BUN 20, CREAT 1.29, GFR 56     Past Surgical History    has a past surgical history that includes Appendectomy; Hernia repair; TONSILLECTOMY, ADENOIDECTOMY; CARDIAC ABLATION; Cardioversion; TEE; Cardiac catheterization; Tonsillectomy; Pericardiocentesis (Left, 11/25/2016); FLUORO-NO CHARGE (N/A, 11/22/2016); Paracentesis (2011); and Paracentesis (06/22/2019).  Family History   family history includes Diabetes in his brother, mother, and sister; Heart disease in his brother and mother; Hyperlipidemia in his brother, brother, and sister; Hypertension in his brother, brother, daughter, mother, and sister; Kidney disease in his brother.  Social History    reports that he quit smoking about 26 years ago. His smoking use included cigarettes. He has a 12.00 pack-year smoking history. He has never used smokeless tobacco. He reports that he  does not drink alcohol and does not use drugs.  Allergies   is allergic to codeine.  Medications     Current Outpatient Medications   Medication Instructions   . acetaminophen (TYLENOL) 500 mg, Oral, Every 6 hours PRN   . allopurinol (ZYLOPRIM) 300 mg, Oral, Daily   . atenolol (TENORMIN) 100 mg, Oral, 2 times daily   . divalproex EC/DR tablet (DEPAKOTE EC/DR) 500 mg, Oral, 2 times daily   . empagliflozin (JARDIANCE) 10 mg, Oral, Every morning   . ferrous sulfate 325 mg, Oral, Every morning with breakfast   . gabapentin (NEURONTIN) 100 mg, Oral, 2 times daily   . hydrALAZINE (APRESOLINE) 75 mg, Oral, 3 times daily   . isosorbide mononitrate (IMDUR) 60 mg, Oral, Daily   . Loperamide HCl (IMODIUM PO) Oral, Daily PRN   . metFORMIN (GLUCOPHAGE) 500 mg, Oral, 2 times daily with meals   . Multiple Vitamin (MULTIVITAMIN) capsule 1 capsule, Oral, Daily    . potassium chloride (K-DUR) 10 MEQ tablet No dose, route, or frequency recorded.   . pravastatin (PRAVACHOL) 40 mg, Oral, Every evening   . rivaroxaban (XARELTO) 15 mg, Oral, Daily with dinner   . SENNA CO Combination, Daily PRN   . tamsulosin (FLOMAX) 0.4 mg, Oral, Daily   . torsemide (DEMADEX) 20 mg, Oral, Daily       Physical Exam     Visit Vitals  BP 142/84 (BP Site: Right arm, Patient Position: Sitting)   Pulse 67   Ht 1.702 m (5\' 7" )   Wt 72.4 kg (159 lb 11.2 oz)   BMI 25.01 kg/m     Wt Readings from Last 3 Encounters:   11/28/19 72.4 kg (159 lb 11.2 oz)   10/19/19 68 kg (150 lb)   10/15/19 71.6 kg (157 lb 13.6 oz)      General appearance - alert, well appearing, and in no distress  Neck - supple, no significant adenopathy, carotids upstroke normal bilaterally, no bruits  Chest -clear to auscultation, no wheezes, rales or rhonchi, symmetric air entry  Heart - irregularly irregular rhythm  Extremities - no pedal edema noted  Neuro: alert   Skin: no rashes  Psych: normal affect    Labs     Lab Results   Component Value Date/Time    WBC 5.4 10/15/2019 01:02 PM    RBC 4.84 10/15/2019 01:02 PM    HGB 14.2 10/15/2019 01:02 PM    HCT 43.6 10/15/2019 01:02 PM    PLT 221 10/15/2019 01:02 PM    TSH 4.57 (H) 06/14/2019 11:56 AM     Lab Results   Component Value Date/Time    NA 132 (L) 10/15/2019 01:02 PM    K 4.0 10/15/2019 01:02 PM    CL 96 (L) 10/15/2019 01:02 PM    CO2 25 10/15/2019 01:02 PM    GLU 420 (H) 10/15/2019 01:02 PM    BUN 37 (H) 10/15/2019 01:02 PM    CREAT 1.86 (H) 10/15/2019 01:02 PM    PROT 7.2 06/20/2019 01:38 PM    ALKPHOS 140 06/20/2019 01:38 PM    AST 18 06/20/2019 01:38 PM    ALT 11 06/20/2019 01:38 PM     Lab Results   Component Value Date/Time    CHOL 70 (L) 11/26/2016 03:02 AM    TRIG 78 11/26/2016 03:02 AM    HDL 21 (L) 11/26/2016 03:02 AM    LDL 33 11/26/2016 03:02 AM     Lab Results   Component Value  Date/Time    HGBA1CPERCNT 6.5 11/26/2016 03:02 AM     Lab Results   Component Value  Date/Time    BNP 1,041.5 (H) 11/15/2016 11:10 AM     EKG:   (personally reviewed by myself):  AF QTc 480    Impression and Recommendations:     1. PAF (paroxysmal atrial fibrillation)  s/p convergent AF ablationwith recurrent AF and CV 1/20. Marland Kitchen He was on amiodarone but this was stopped 10/25/19 due to concern over QT prolongation and recurrent afib.   Continue xarelto, CHADSVAS=6   (age, DM, HTN, TIA, CHF).  At this point, we will continue with rate control strategy.  We will review further with Dr. [name] and get his input but I doubt that we will be able to keep him in rhythm without the use of an antiarrhythmic.     2.  Aortic valve stenosis  Moderate . Due for repeat echo 06/2020.    3. Aortic dissection, thoracic  Keep f/u with Dr Kathrin Ruddy, stable on last CT    4. Pulmonary HTN  5. RH failure  RH enlargement with severe pulm HTN. CT showed no interstitial lung disease.  He is now being followed at the heart failure clinic.  Significant clinical improvement since discharge.    6. HTN  Good control    7. Hyperlipidemia  Last LDL 38  Continue pravastatin    8.  Diabetes  Per PCP.  Last A1c 6.2%    Return in about 3 months (around 02/27/2020).      Electronically signed by: Linden Dolin, PA 11/28/2019

## 2019-11-28 NOTE — Progress Notes (Signed)
No office visit

## 2019-11-30 ENCOUNTER — Ambulatory Visit
Admission: RE | Admit: 2019-11-30 | Discharge: 2019-11-30 | Disposition: A | Discharge status: Home / Self Care | Payer: No Typology Code available for payment source | Source: Ambulatory Visit | Attending: Family Nurse Practitioner | Admitting: Family Nurse Practitioner

## 2019-11-30 ENCOUNTER — Encounter: Payer: Self-pay | Admitting: Family Nurse Practitioner

## 2019-11-30 VITALS — BP 136/84 | HR 70 | Wt 159.0 lb

## 2019-11-30 DIAGNOSIS — I10 Essential (primary) hypertension: Secondary | ICD-10-CM

## 2019-11-30 DIAGNOSIS — N184 Chronic kidney disease, stage 4 (severe): Secondary | ICD-10-CM

## 2019-11-30 DIAGNOSIS — I5032 Chronic diastolic (congestive) heart failure: Secondary | ICD-10-CM

## 2019-11-30 DIAGNOSIS — I4819 Other persistent atrial fibrillation: Secondary | ICD-10-CM

## 2019-11-30 DIAGNOSIS — Z7901 Long term (current) use of anticoagulants: Secondary | ICD-10-CM

## 2019-11-30 DIAGNOSIS — R5381 Other malaise: Secondary | ICD-10-CM

## 2019-11-30 NOTE — Patient Instructions (Signed)
Call us if you have weight gain of 2 lbs overnight or 5 lbs in 1 week.  Our number is [phone]    Please find enclosed physical therapy order.  Good luck with building your strength.

## 2019-11-30 NOTE — Progress Notes (Signed)
[name]      Advanced Heart Failure Established Visit - Telehealth    [name]   Date of Birth: Feb 23, 1951    Provider: Belva Agee, NP     Patient Care Team:  [name], MD as PCP - General (Family Medicine)  [name] Litten, DO as Consulting Physician (Cardiology)  [name] Bach, MD as Consulting Physician (Thoracic and Cardiac Surgery)    Chief Complaint:      Follow up     HPI:  I had the pleasure of speaking to [name] today from  [name] for an advanced heart failure new appointment for [name] follow up.      This is a telehealth visit which was conducted with the use of interactive HIPPA compliant audio only, video option unavailable to patient telecommunication that permitted real time communication between Mr. [name] and myself. He consented to participation and received services at home, while I was located at the [name]. This visit was changed from an in-person visit to a telehealth visit to lower the risk of exposure and / or spread of the current pandemic with the SARS CoV-2 virus. This is based on the guidelines from the [name] and other health agencies.    Blood pressures are acceptable at home.   He is now living upstairs and has to walk up 15 steps for bedroom. His sister is living in basement. He notes he is weak and his endurance is poor. He is using cpap therapy.  His cyst has completed healed.  Blood sugars were elevated.  They added back amaryl and they improved. Denies taking any increased doses of torsemide.  Reports stable weights at home.       [name] is a 69 y.o. male with HFpEF, NYHA Class 3, ACC/AHA.  Also has a history of paroxysmal atrial fibrillation status post ablation, diabetes, chronic kidney disease stage III-IV, hypertension hyperlipidemia bipolar disorder. From a  cardiovascular standpoint, Karsten states has not done well.      He denies any fever, chills, night sweats, lightheadedness, dizziness, presyncope or syncope, shortness of breath at rest, orthopnea, PND, increasing fatigue or decreasing exercise tolerance. He has had no chest pain or pressure.  His appetite has been stable and he denies any abdominal bloating or distention, early satiety, anorexia, nausea, vomiting or changes in bowel or bladder habits.     Miking reports that he has not been exercising.  He does check blood pressures at home.   He has been checking morning weights; his weight has been stable and slightly declined since discharge.  [name] has been following a fluid restriction of 2 L/day and has restricting dietary sodium content to no more than 2 gm/day.  He reports that he has been compliant with his medications.      Weight:   Wt Readings from Last 3 Encounters:   11/30/19 72.1 kg (159 lb)   11/28/19 72.4 kg (159 lb 11.2 oz)   10/19/19 68 kg (150 lb)       Review of Systems:    Comprehensive 12 point review of system is negative unless described above in HPI        Medical Problems:  Patient Active Problem List    Diagnosis Date Noted   . Diastolic CHF, acute 16/05/9603   . Nonrheumatic aortic valve stenosis 02/26/2019   . Follow-up exam  01/26/2017   . 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 Chronic anticoagulation 11/25/2016   . Stroke 11/25/2016   . CKD (chronic kidney disease) 11/25/2016   . Former smoker, stopped smoking many years ago 11/25/2016   . Type 2 diabetes mellitus 11/25/2016   . HTN (hypertension) 11/25/2016   . HLD (hyperlipidemia) 11/25/2016   . Diabetic neuropathy 11/25/2016   . COPD (chronic obstructive pulmonary disease) 11/25/2016   . Bipolar disorder 11/25/2016   . BPH (benign prostatic hyperplasia) 11/25/2016   . Atrial fibrillation 11/22/2016   . Chronic atrial fibrillation 10/08/2016   . Stage 4 chronic kidney disease 10/08/2016   . Persistent atrial fibrillation  10/07/2016   . Fatty liver 10/07/2016     Past Medical History:  1. Atrial fibrillation  A. Cardioversion 10/22/11.   B. Echo 10/22/11- moderate-severe concentric LVH. Biatrial enlargement. Mild AS, dilated inferior vena cava, small pericardial effusion. Peak velocity across the AV at 2.2 meters/s.  C. Cardiac abltation 01/13/15.   D. MRI Cardiac 03/16/16- LVEF 68.4%. Moderate septal wall hypertrophy (1.6cm) and mild posterior wall hypertrophy (1.3cm). Focal area of mid-myocardial wall delayed enhancement in basal anteroseptal wall and RV insertion point consistent with hypertensive heart disease. RVEF 41.7%. Severe LA enlargement and moderate RA enlargement. 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 Mild AS with peak velocity 2.23m/s. Mild central AR. Mild MR and TR. Small to moderate pericardial effusion. Mildly dilated ascending aorta. Aortic arch and descending thoracic aorta are ectatic. Trivial right pleural effusion. Dilated central pulm artery at 4.3cm.   E. s/p TEE and [facility] cardioversion 06/04/16  F. Convergent AF Ablation 11/22/16 - Procedure was complicated with pericardial effusion/ tamponade requiring pericardiocentesis. He developed left pleural effusion with thoracentesis.   G. Echo 12/03/16- No pericardial effusion, aortic valve is severely calcified with severely restricted systolic opening, mild regurgitation, LV is normal in size with moderate to severe concentric hypertrophy, systolic function is preserved with an EF 96-75%, diastolic dysfunction that couldn't be graded.   H. Echo 12/08/17- EF 60-65%. Moderate concentric LV hypertrophy. LA and RA severely dilated. Mild MR and moderate TR. Moderate valvular aortic stenosis. Mild to moderate AR and pulmonic valvular regurgitation. Mildly dilated ascending aorta 3.8-4.2 cm.   I. Echo 01/29/19- Moderate to severe concentric LVH, EF 91-63%, grade 2 diastolic dysfunction of the LV (with evidence of increased  left atrial pressure), RV size is moderately increased, RV hypertrophy, RV systolic function is mild to moderately decreased, proximal ascending aorta is mildly dilated at 4.2cm. moderate aortic stenosis, mild to moderate insufficiency, mitral valve regurgitation is mild, tricuspid regurgitation is moderate, estimated pulm arterial systolic pressure is severely elevated, 43mmHg, pulm valve regurgitation is mild to moderate.   J. Echo 06/21/2019  ef 65-70%. right ventricular size is mildy increased. The right ventricular systolic function is mildly decreased. Moderate aortic stenosis with peak aortic valve velocity 3.5 m/s and mean pressure gradient of 23 mmHg. Mild to moderate aortic regurgitation. Mild mitral regurgitation.  Moderate tricuspid regurgitation.  Estimated RVSP 93 mmHg.     2. Aorta status  A. 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 (Again there is small area of persistent perfusion of the aneurysm sac which appears to have diminished since prior exam and may be related to partial thrombosis versus technique). At subsequent follow-up, would encourage delayed evaluation in addition to arterial phase evaluation. Maximal  aneurysmal dilatation involving the descending segment measures 45 x 39 mm.     3. Hypertension.  4. Hyperlipidemia.   5. Type I Diabetes Mellitus.  6. Bipolar disorder.  7. Fatty liver.  8. Renal insufficiency.  A. Normal renal ultrasound October 23, 2011.  9. Basilar Airway disease  Past Medical History:   Diagnosis Date   . Arrhythmia    . Arthritis    . Atrial fibrillation    . Atrial flutter    . Bipolar affective    . BPH (benign prostatic hyperplasia)    . Complication of anesthesia     resp. asessment   . Diabetes mellitus    . Diabetic neuropathy    . Gout    . Heart murmur    . HOH (hard of hearing)    . Hyperlipidemia    . Hypertension    . Paroxysmal atrial fibrillation     . Pulmonary hypertension    . Renal insufficiency    . Type 2 diabetes mellitus, controlled    . Wears glasses      Past Surgical History:   has a past surgical history that includes Appendectomy; Hernia repair; TONSILLECTOMY, ADENOIDECTOMY; CARDIAC ABLATION; Cardioversion; TEE; Cardiac catheterization; Tonsillectomy; Pericardiocentesis (Left, 11/25/2016); FLUORO-NO CHARGE (N/A, 11/22/2016); Paracentesis (2011); and Paracentesis (06/22/2019).  Family History:  family history includes Diabetes in his brother, mother, and sister; Heart disease in his brother and mother; Hyperlipidemia in his brother, brother, and sister; Hypertension in his brother, brother, daughter, mother, and sister; Kidney disease in his brother.  Social History:   reports that he quit smoking about 26 years ago. His smoking use included cigarettes. He has a 12.00 pack-year smoking history. He has never used smokeless tobacco. He reports that he does not drink alcohol and does not use drugs.     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  Medications:    Current Outpatient Medications:   .  acetaminophen (TYLENOL) 500 MG tablet, Take 500 mg by mouth every 6 (six) hours as needed for Pain., Disp: , Rfl:   .  allopurinol (ZYLOPRIM) 300 MG tablet, Take 300 mg by mouth daily., Disp: , Rfl:   .  atenolol (TENORMIN) 100 MG tablet, Take 100 mg by mouth 2 (two) times daily, Disp: , Rfl:   .  divalproex EC/DR tablet (DEPAKOTE EC/DR) 250 MG EC tablet, Take 500 mg by mouth 2 (two) times daily  , Disp: , Rfl:   .  empagliflozin (Jardiance) 10 MG tablet, Take 1 tablet (10 mg total) by mouth every morning, Disp:  , Rfl:   .  ferrous sulfate 325 (65 FE) MG tablet, Take 325 mg by mouth every morning with breakfast, Disp: , Rfl:   .  gabapentin (NEURONTIN) 100 MG capsule, Take 100 mg by mouth 2 (two)  times daily., Disp: , Rfl:   .  hydrALAZINE (APRESOLINE) 25 MG tablet, Take 3 tablets (75 mg total) by mouth 3 (three) times daily, Disp: 270 tablet, Rfl: 11  .  isosorbide mononitrate (IMDUR) 60 MG 24 hr tablet, Take 1 tablet (60 mg total) by mouth daily, Disp: 30 tablet, Rfl: 11  .  Loperamide HCl (IMODIUM PO), Take by mouth daily as needed., Disp: , Rfl:   .  metFORMIN (GLUCOPHAGE) 500 MG tablet, Take  500 mg by mouth 2 (two) times daily with meals, Disp: , Rfl:   .  Multiple Vitamin (MULTIVITAMIN) capsule, Take 1 capsule by mouth daily., Disp: , Rfl:   .  potassium chloride (K-DUR) 10 MEQ tablet, , Disp: , Rfl:   .  pravastatin (PRAVACHOL) 40 MG tablet, Take 40 mg by mouth every evening., Disp: , Rfl:   .  rivaroxaban (Xarelto) 15 MG Tab, Take 1 tablet (15 mg total) by mouth daily with dinner, Disp: 30 tablet, Rfl: 11  .  SENNA CO, by Combination route daily as needed., Disp: , Rfl:   .  tamsulosin (FLOMAX) 0.4 MG Cap, Take 0.4 mg by mouth daily., Disp: , Rfl:   .  torsemide (DEMADEX) 20 MG tablet, Take 20 mg by mouth daily, Disp: , Rfl:     Vitals:    Visit Vitals  BP 136/84   Pulse 70   Wt 72.1 kg (159 lb)   SpO2 93%   BMI 24.90 kg/m     Weight is stable per his report.       Physical exam was not completed at this time due to Covid 19      Laboratory Data:  Lab Results   Component Value Date/Time    WBC 5.4 10/15/2019 01:02 PM    RBC 4.84 10/15/2019 01:02 PM    HGB 14.2 10/15/2019 01:02 PM    HCT 43.6 10/15/2019 01:02 PM    PLT 221 10/15/2019 01:02 PM    TSH 4.57 (H) 06/14/2019 11:56 AM     Lab Results   Component Value Date/Time    NA 132 (L) 10/15/2019 01:02 PM    K 4.0 10/15/2019 01:02 PM    CL 96 (L) 10/15/2019 01:02 PM    CO2 25 10/15/2019 01:02 PM    GLU 420 (H) 10/15/2019 01:02 PM    BUN 37 (H) 10/15/2019 01:02 PM    CREAT 1.86 (H) 10/15/2019 01:02 PM    PROT 7.2 06/20/2019 01:38 PM    ALKPHOS 140 06/20/2019 01:38 PM    AST 18 06/20/2019 01:38 PM    ALT 11 06/20/2019 01:38 PM     Lab Results    Component Value Date/Time    CHOL 70 (L) 11/26/2016 03:02 AM    TRIG 78 11/26/2016 03:02 AM    HDL 21 (L) 11/26/2016 03:02 AM    LDL 33 11/26/2016 03:02 AM     Lab Results   Component Value Date/Time    HGBA1CPERCNT 6.5 11/26/2016 03:02 AM     Lab Results   Component Value Date/Time    BNP 1,041.5 (H) 11/15/2016 11:10 AM     Assessment and Plan:  1. Chronic diastolic heart failure     2. Persistent atrial fibrillation     3. Chronic anticoagulation     4. Essential hypertension     5. Stage 4 chronic kidney disease     6. debility      My impression is that, Mr. Tanguma has HFpEF.  Appears to be doing well on his current regimen of diuretics.   Recommend continuing low-dose torsemide. Continue current medications. Will order pt.  Patient may return here as needed. Continue cpap therapy.        Counseling:  -I have encouraged pt to continue to follow a <2gm/day Na restriction and limit fluid intake to < 2L/day.  -I have requested pt perform daily wts and contact our office for increase of >2-3lbs in 1 day or 5lbs in  1 week.   -I have encouraged pt to engage in aerobic activity as tolerated and avoid heavy weight lifting.   -Smoking/Alcohol: I have advised pt to abstain from any tobacco use and excessive alcohol intake.    -Weight: Obesity is a significant risk for both short and long-term morbidity and mortality for AHF patients.  I have stressed the importance of weight control and dietary management.     Our office is available to answer any questions you or the patient may have. We appreciate the opportunity to participate in the care of your patients.      Electronically signed by:    Norval Gable. Abigail Butts, FNP-C  Clinical Coordinator for Advance Heart Failure and [name]  [address]  [place], [place] [address]  [phone]   [phone] - portable phone  [phone] - fax         Note: This chart was generated by the [name] EMR  system/speech recognition and may contain inherit omission or errors not intended by the user.  Grammatical errors, random word insertions, deletions, pronoun errors and incomplete sentences are occasionally consequences of this technology due to software limitations.  Not all errors are caught or corrected.  If there are questions or concerns about the content of this note or information contained in the body of this dictation they should be addressed directly with the author for clarification.

## 2020-01-01 ENCOUNTER — Ambulatory Visit: Payer: No Typology Code available for payment source

## 2020-01-09 ENCOUNTER — Ambulatory Visit (INDEPENDENT_AMBULATORY_CARE_PROVIDER_SITE_OTHER): Payer: No Typology Code available for payment source | Admitting: Thoracic Surgery (Cardiothoracic Vascular Surgery)

## 2020-01-15 ENCOUNTER — Ambulatory Visit (INDEPENDENT_AMBULATORY_CARE_PROVIDER_SITE_OTHER): Payer: No Typology Code available for payment source | Admitting: Specialist

## 2020-02-12 ENCOUNTER — Ambulatory Visit (INDEPENDENT_AMBULATORY_CARE_PROVIDER_SITE_OTHER): Payer: No Typology Code available for payment source | Admitting: Thoracic Surgery (Cardiothoracic Vascular Surgery)

## 2020-03-03 ENCOUNTER — Encounter: Payer: No Typology Code available for payment source | Admitting: Physician Assistant

## 2020-09-28 ENCOUNTER — Other Ambulatory Visit: Payer: Self-pay | Admitting: Family

## 2020-09-29 ENCOUNTER — Other Ambulatory Visit: Payer: Self-pay | Admitting: Family

## 2021-04-30 IMAGING — US US LOW EXT ARTERIES RIGHT
1 series · 13 of 16 positions shown · non-contrast
Comparison: No prior similar
At findings:
Right CFA:               83, 66 cm/s
Right Profunda:       55 cm/s
Right SFA prox:        84 cm/s
Right SFA mid:         88 cm/s
Right SFA distal:      77 cm/s
Right Popliteal:         70, 55 cm/s
Rt tib-per trunk           67 cm/s
Right PTA prox:        Approximately 70 cm/s, no annotated measurements
Right PTA mid          67 cm/s
Right PTA distal:       51 cm/s
Rt peroneal prox          68 cm/s
Rt peroneal mid          46 cm/s
Rt peroneal distal          36, 43 cm/s
Right ATA prox:         61 cm/s
Right ATA mid            62 cm/s
Right ATA distal         49 cm/s
Right DPA                  65 cm/s
Submitted gray scale and color Doppler images of the RIGHT side do not isolated focal anatomic stenosis.
Pulsatile triphasic flow right CFA, right SFA, right popliteal and tibial peroneal trunk.
Pulsatile flow maintained throughout right PTA.
Right peroneal artery as pulsatile triphasic flow proximal to mid segment with mild blunting of systolic upstroke. Distal right peroneal artery has monophasic pattern of inflow obstruction, pulses parvus and tardus with minimal diastolic flow.
Right anterior tibial artery has triphasic flow throughout, mild blunting systolic upstroke.
Right DTPA has biphasic pulsatile flow with preserved early diastolic reversal.

FINAL REPORT:
Right lower Lower Extremity Arterial Vascular US
CLINICAL DATA: Swelling, redness, pain, open ulcerations right calf

[Series 1: us low ext arteries right · 13 of 45 slices shown]
[im 1/45]
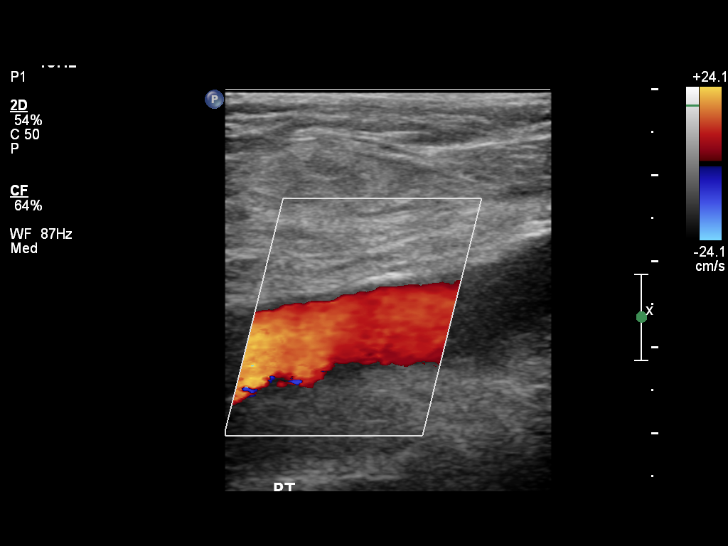
[im 3/45]
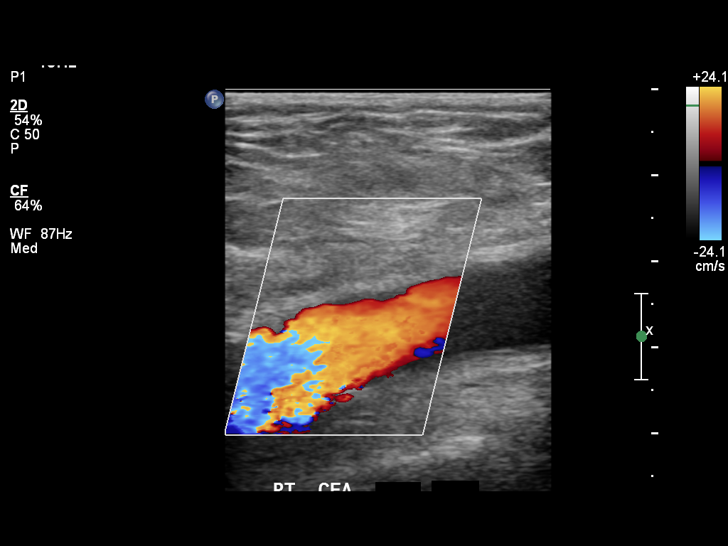
[im 9/45]
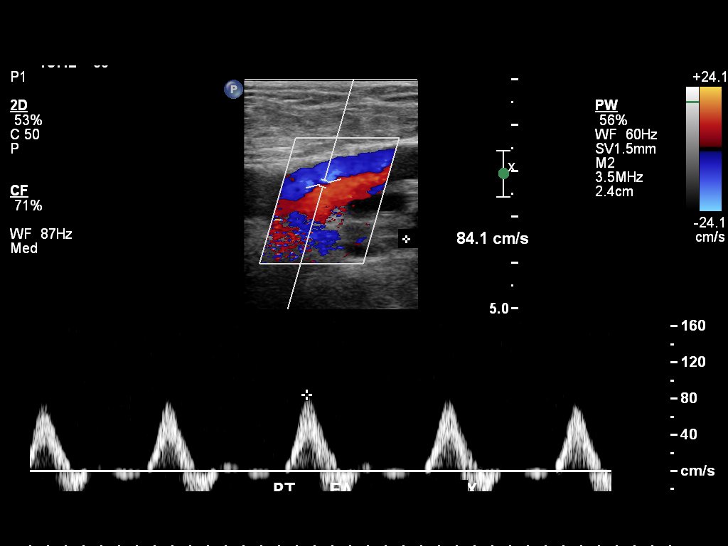
[im 12/45]
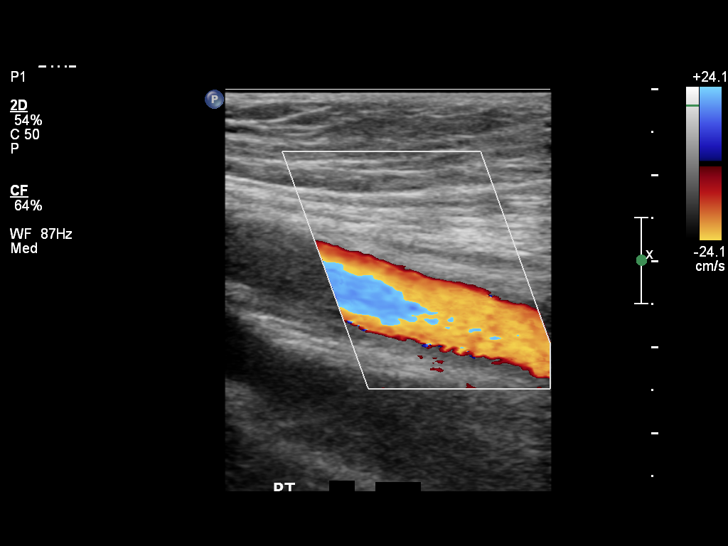
[im 15/45]
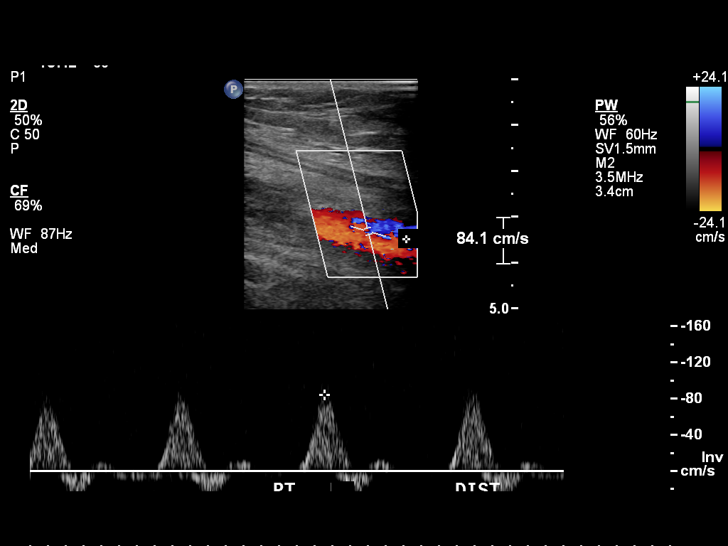
[im 18/45]
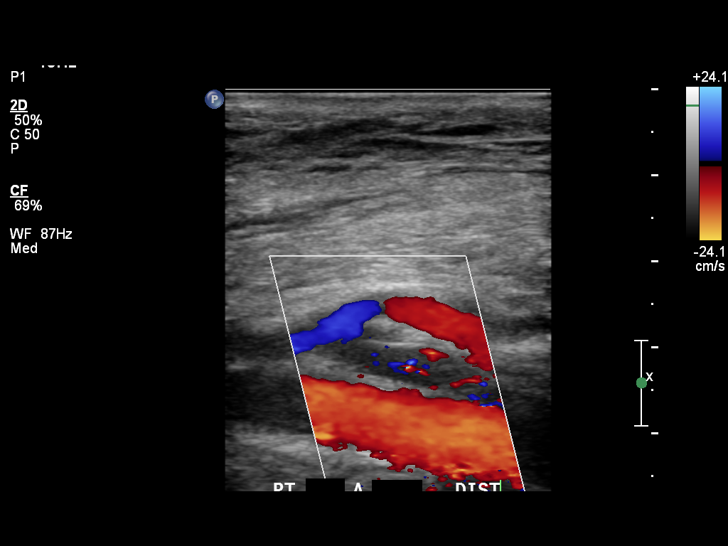
[im 24/45]
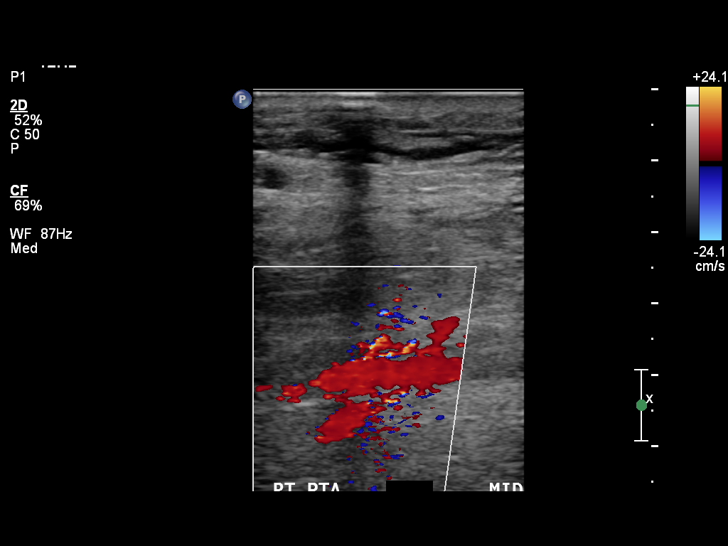
[im 27/45]
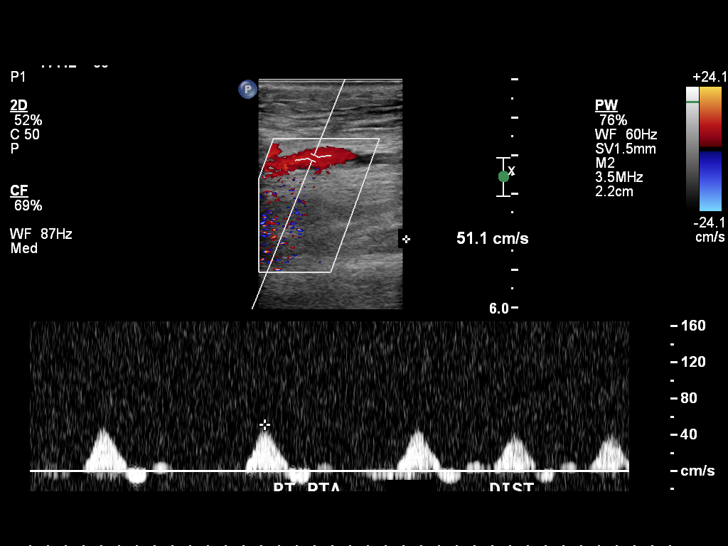
[im 30/45]
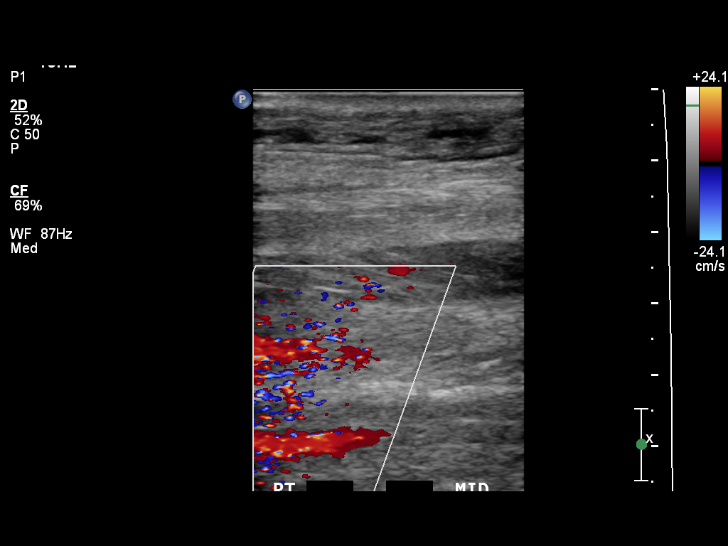
[im 33/45]
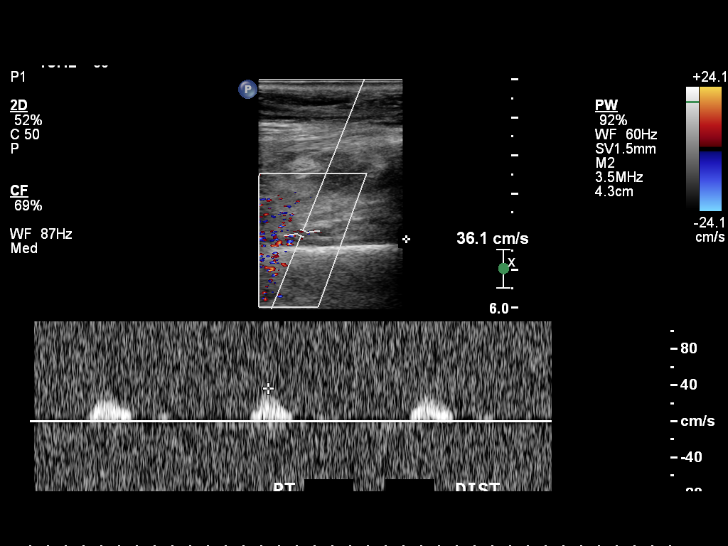
[im 36/45]
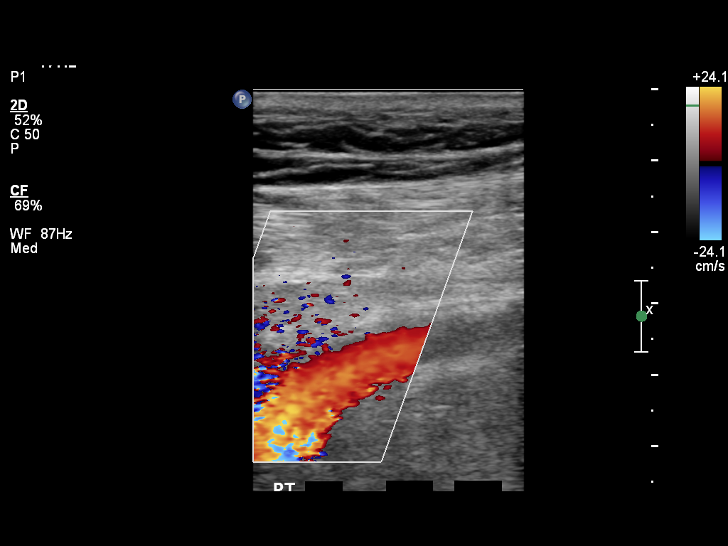
[im 42/45]
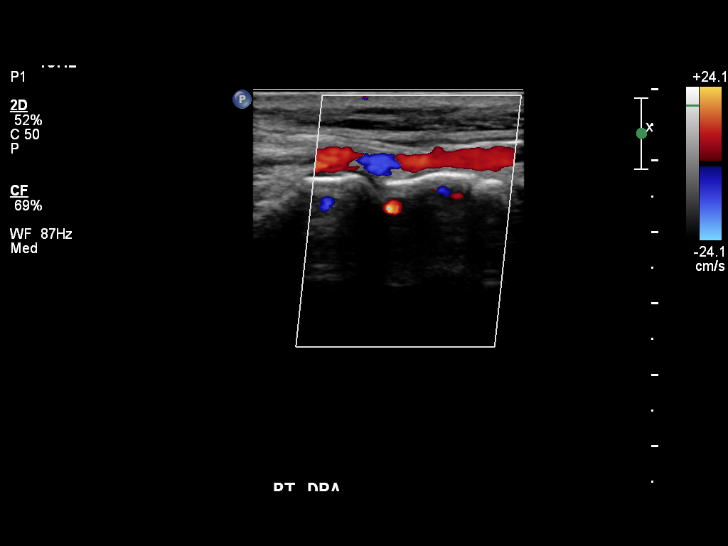
[im 45/45]
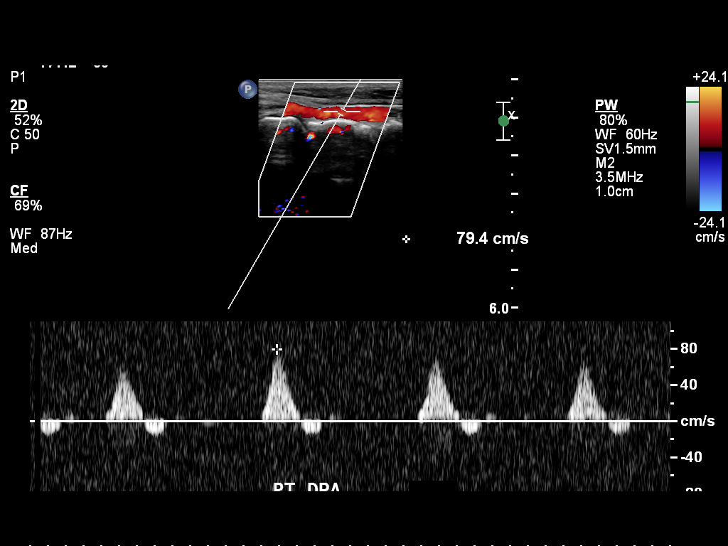

[13 of 16 positions shown; findings below may reference images not displayed]

IMPRESSION: Triphasic flow is maintained from the right common femoral artery through the tibioperoneal trunk trunk and posterior tibial artery.
Right anterior tibial artery has triphasic flow; biphasic DTPA flow may reflect loss of vascular elasticity or distal small vessel disease.
Alteration  of Swelling, redness, pain, open ulcerations right calfwaveform between mid and distal segments of right peroneal artery supports intervening hemodynamically significant stenosis with monophasic pattern of inflow obstruction distal right peroneal artery.

## 2021-11-06 IMAGING — US US RENAL COMPLETE
1 series · 14 of 25 positions shown · non-contrast
Comparison: 04/30/2021.

FINAL REPORT:
US RENAL COMPLETE
INDICATION: Chronic kidney disease.
TECHNIQUE: Multiple sonographic grayscale images of the anatomic regions listed below were acquired with selected images stored to PACS for review. Color Doppler and Doppler arterial/venous waveform images were also obtained.
_______________

[Series 1: us renal complete · 14 of 59 slices shown]
[im 1/59]
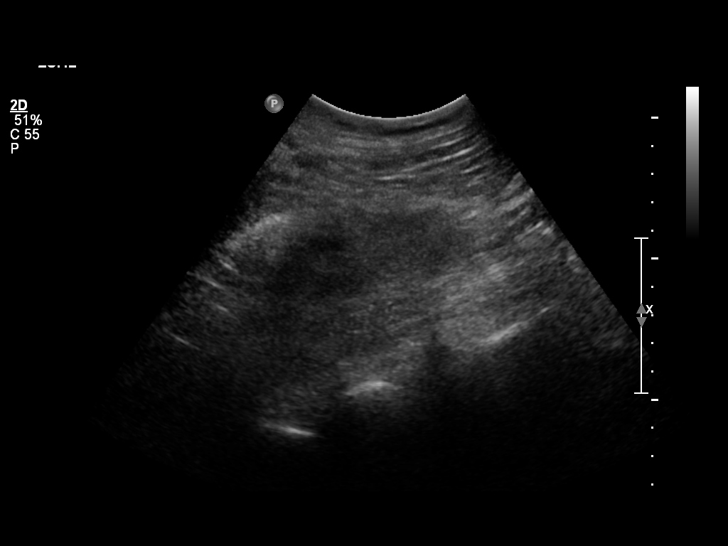
[im 5/59]
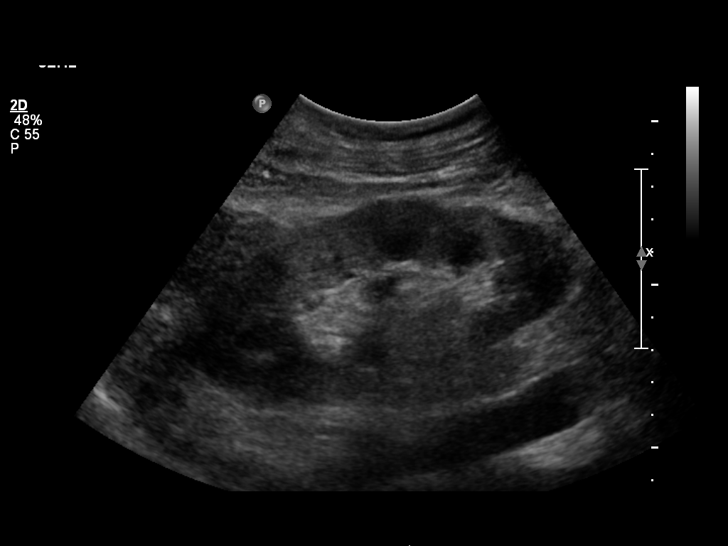
[im 10/59]
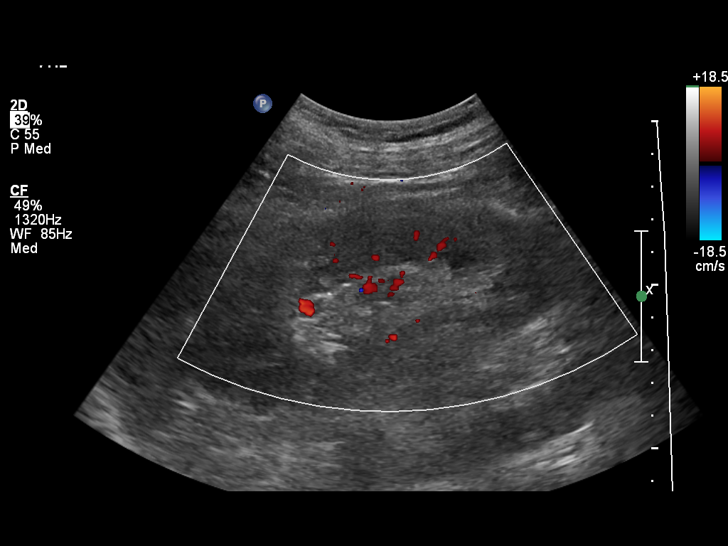
[im 15/59]
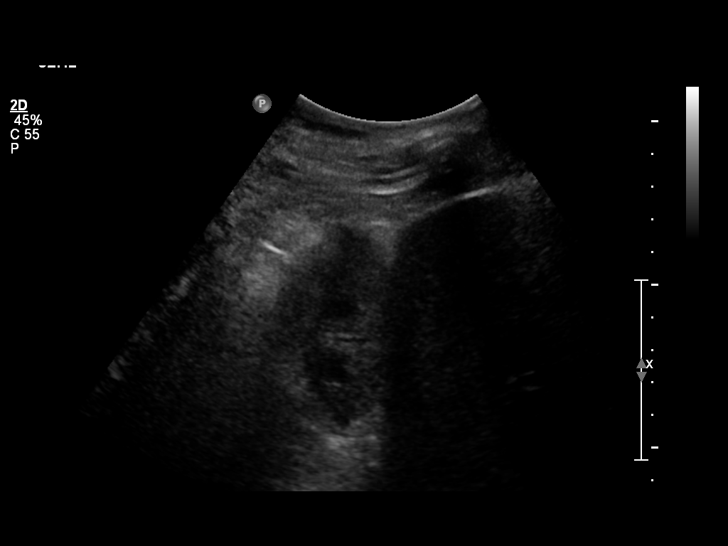
[im 20/59]
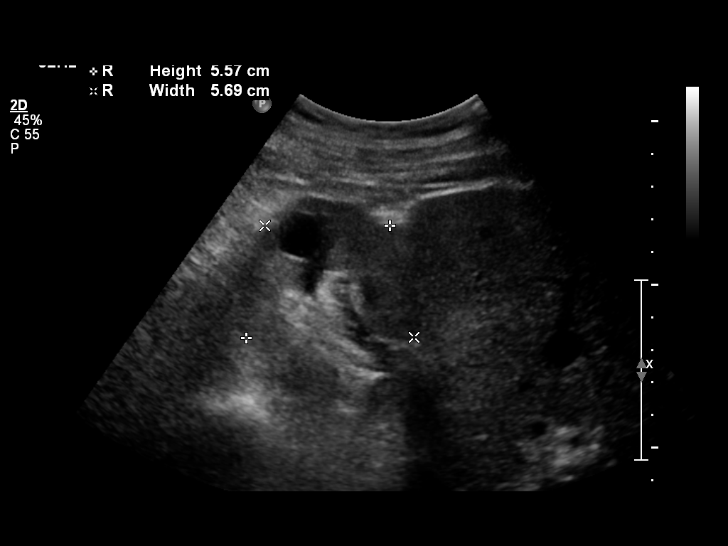
[im 22/59]
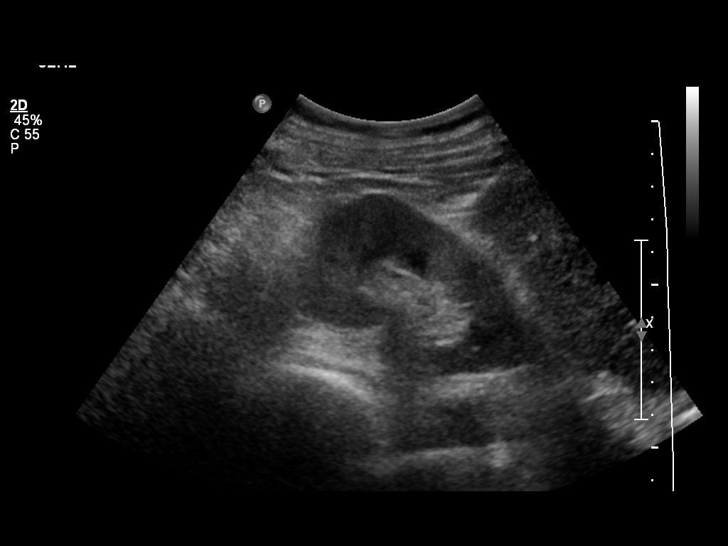
[im 27/59]
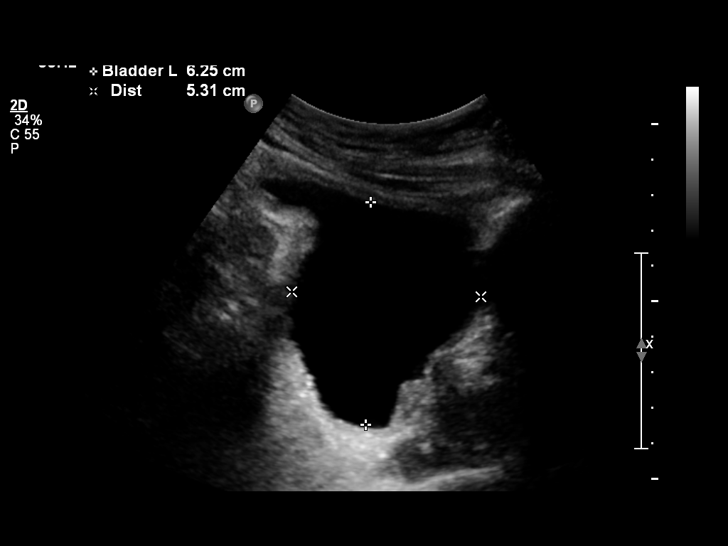
[im 32/59]
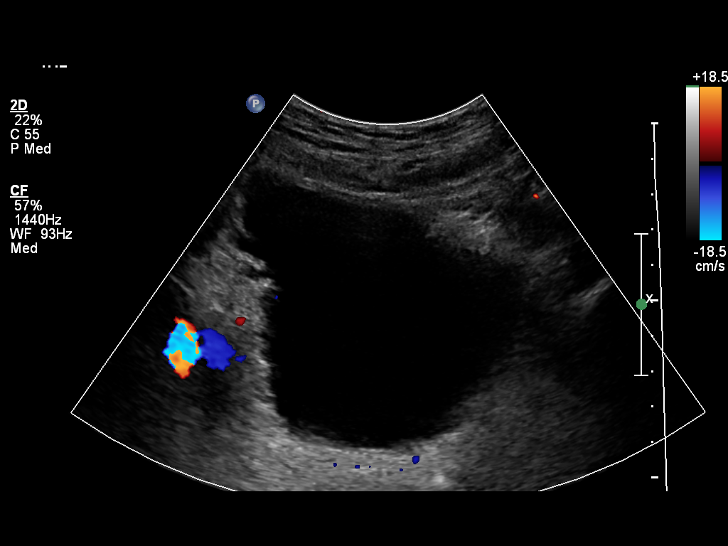
[im 37/59]
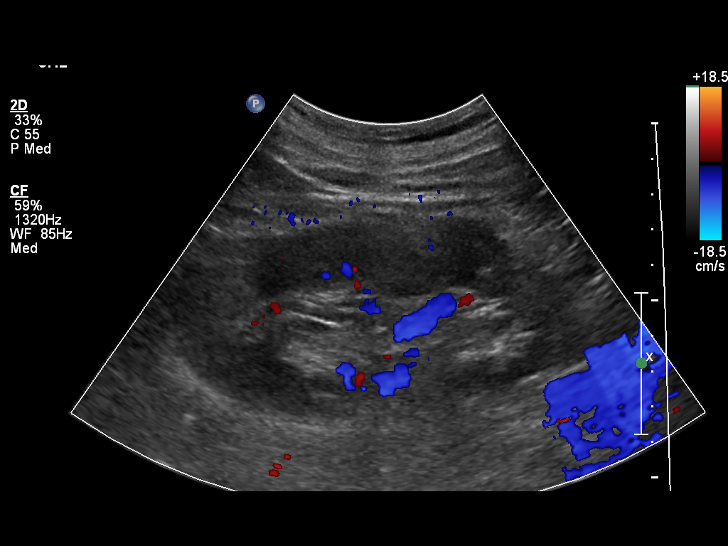
[im 39/59]
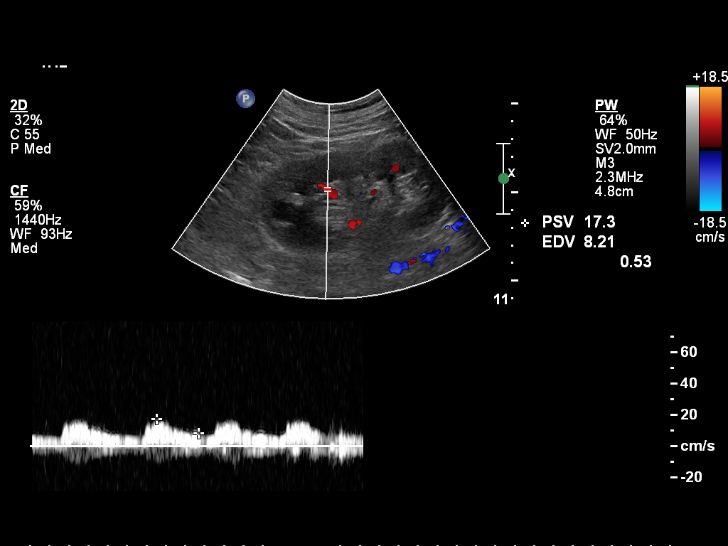
[im 44/59]
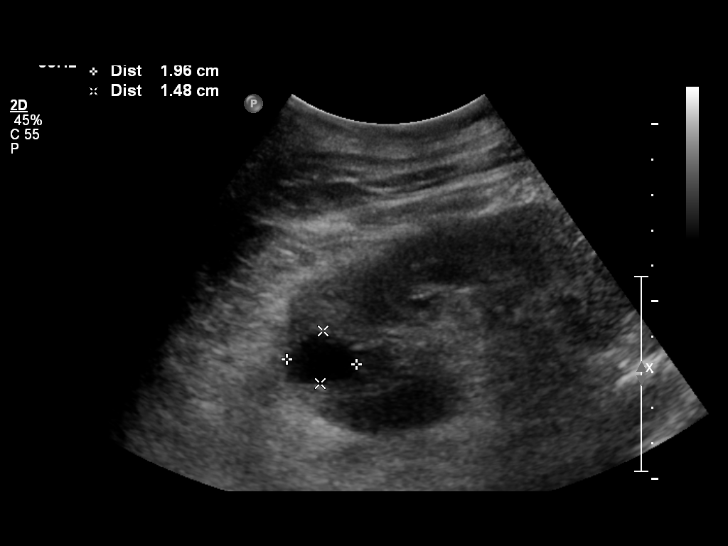
[im 49/59]
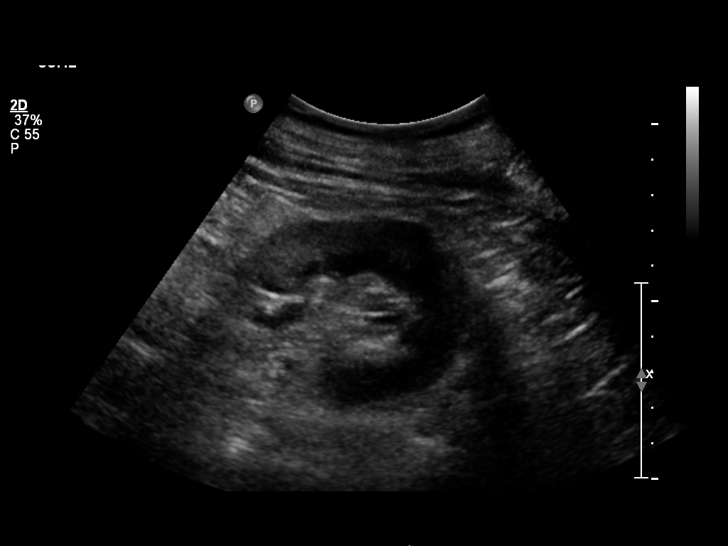
[im 54/59]
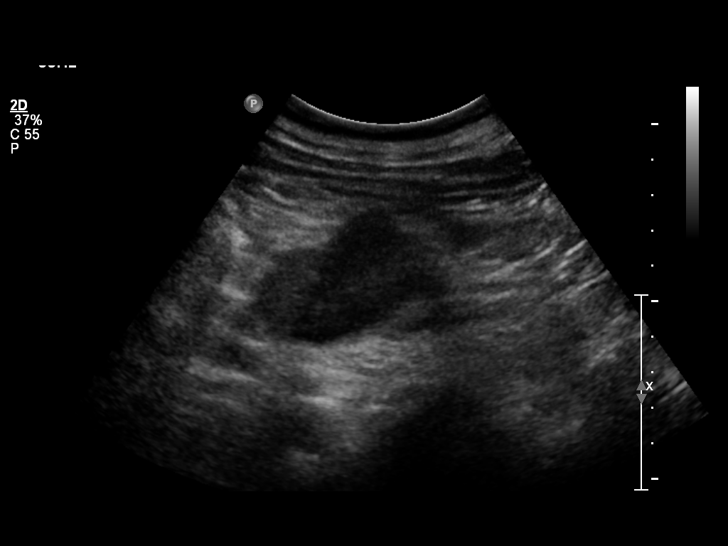
[im 59/59]
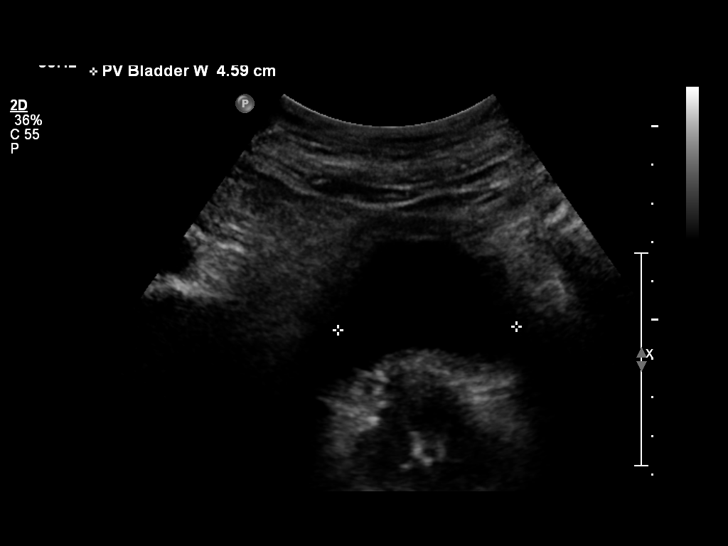

[14 of 25 positions shown; findings below may reference images not displayed]

FINDINGS: RIGHT KIDNEY:
Size: 11.6 cm.
Echogenicity/echotexture: Normal.
Nephrolithiasis: None.
Hydronephrosis: None.
Right renal cyst present without concerning features to suggest malignancy.
LEFT KIDNEY:
Size: 11.94 cm.
Echogenicity/Echotexture: Normal.
Nephrolithiasis: None.
Hydronephrosis: None.
Left renal cyst present without concerning features to suggest malignancy.
BLADDER: Unremarkable.
_______________
IMPRESSION: Renal cysts present without concerning features to suggest malignancy.
Otherwise unremarkable exam.

## 2022-01-14 IMAGING — US US LOW EXT ARTERIES BILAT
1 series · 14 of 16 positions shown · non-contrast
Comparison: Lower extremity arterial Doppler from 04/30/2021

FINAL REPORT:
INDICATION: Localized edema
EXAM: Bilateral lower extremity arterial Doppler

[Series 1: us low ext arteries bilat · 14 of 40 slices shown]
[im 1/40]
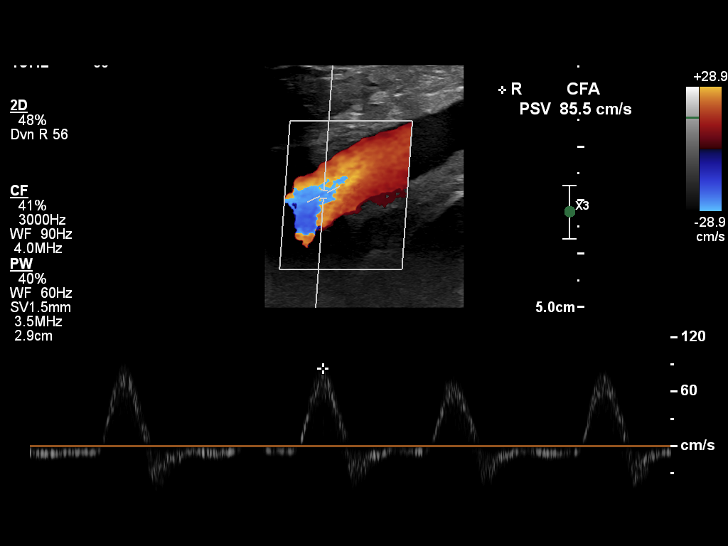
[im 3/40]
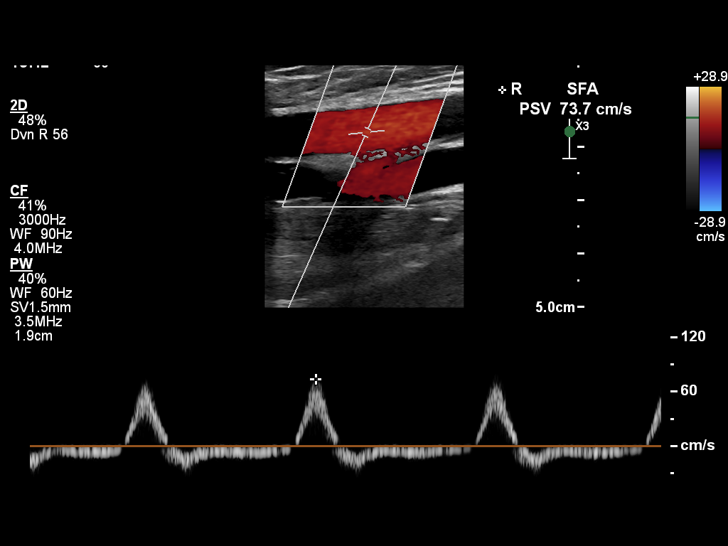
[im 6/40]
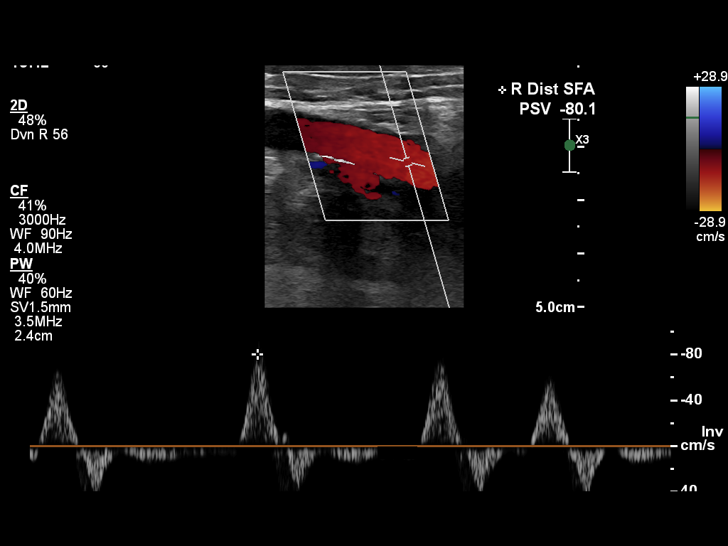
[im 11/40]
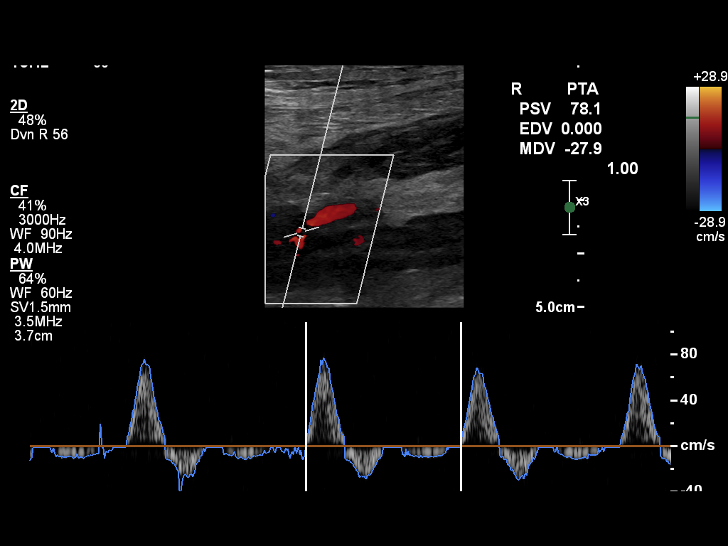
[im 14/40]
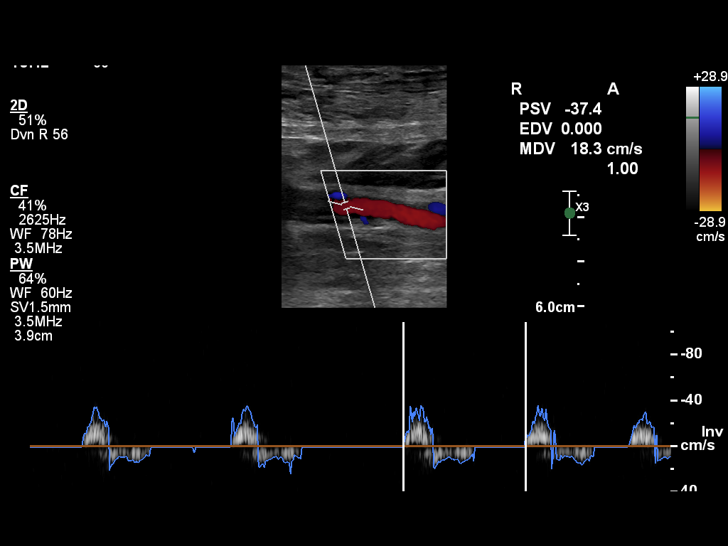
[im 16/40]
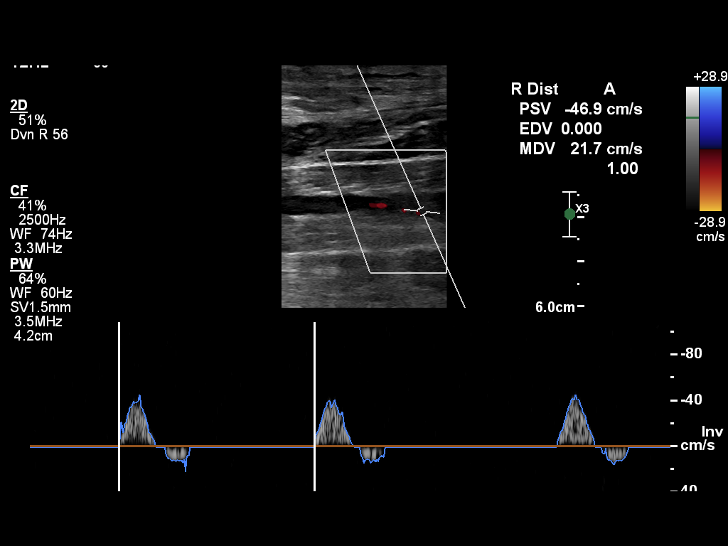
[im 19/40]
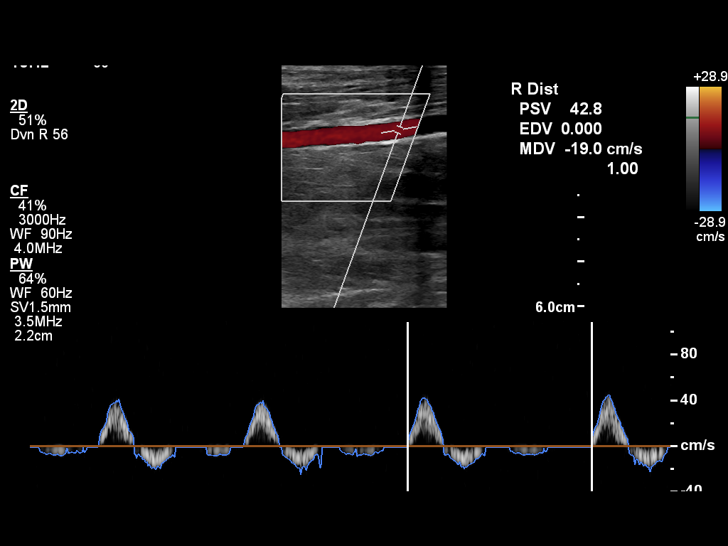
[im 21/40]
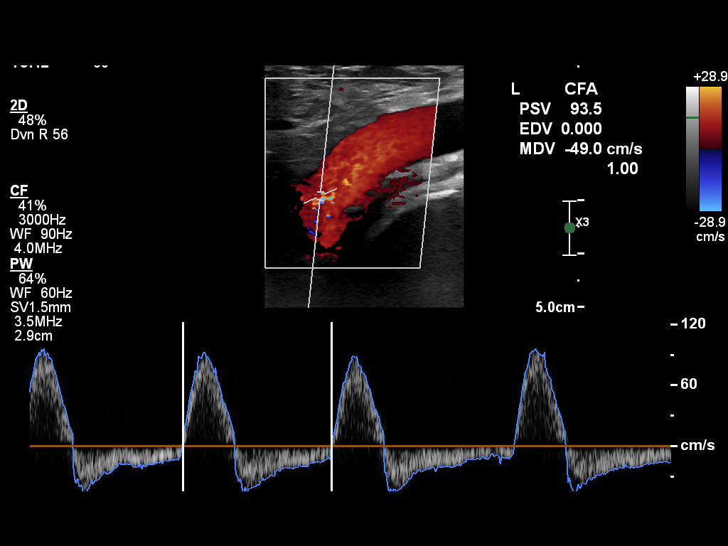
[im 24/40]
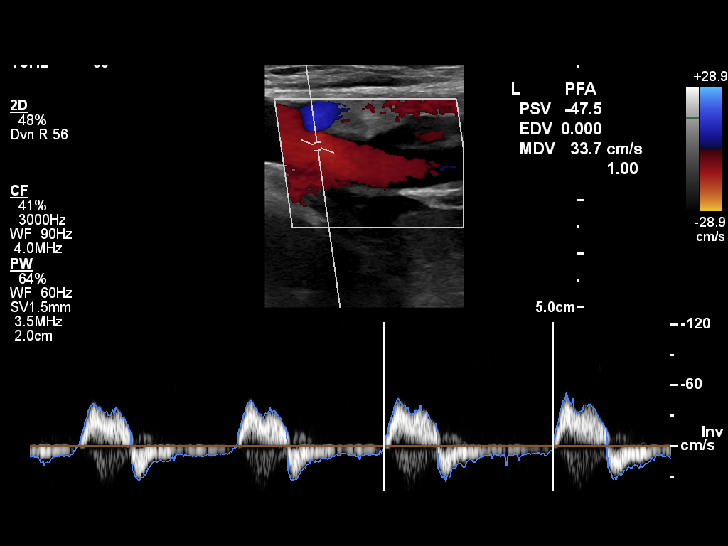
[im 27/40]
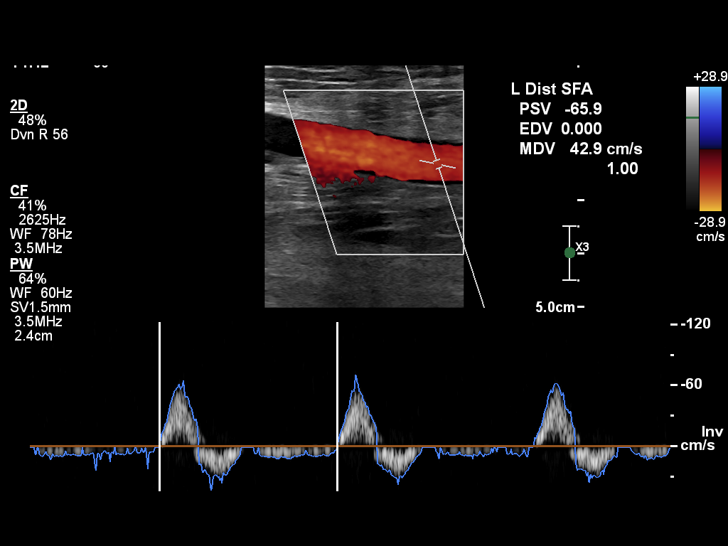
[im 32/40]
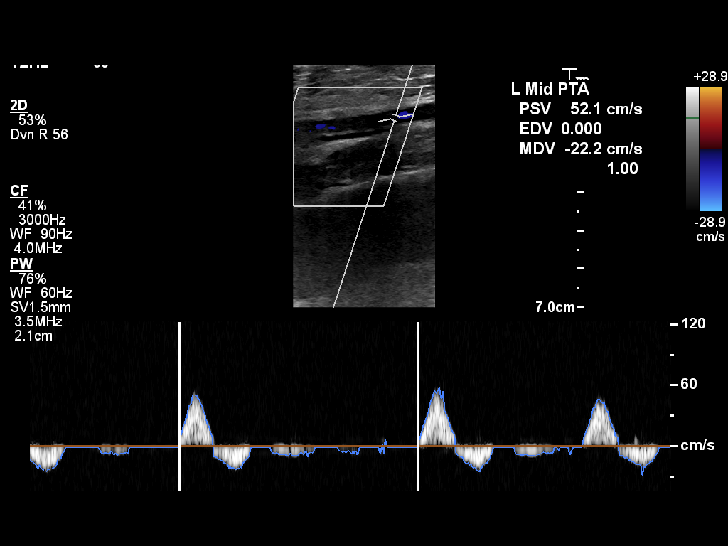
[im 34/40]
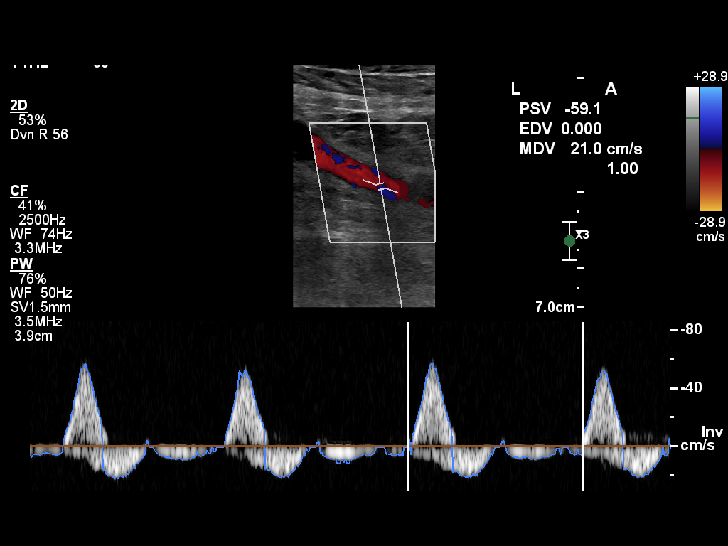
[im 37/40]
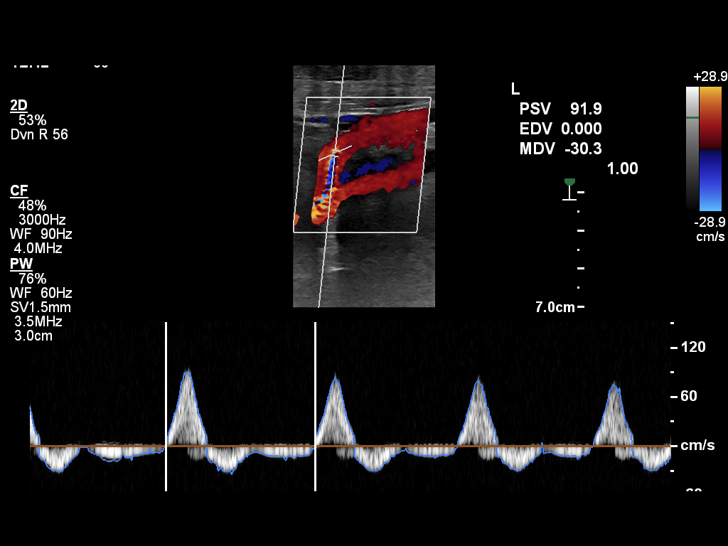
[im 40/40]
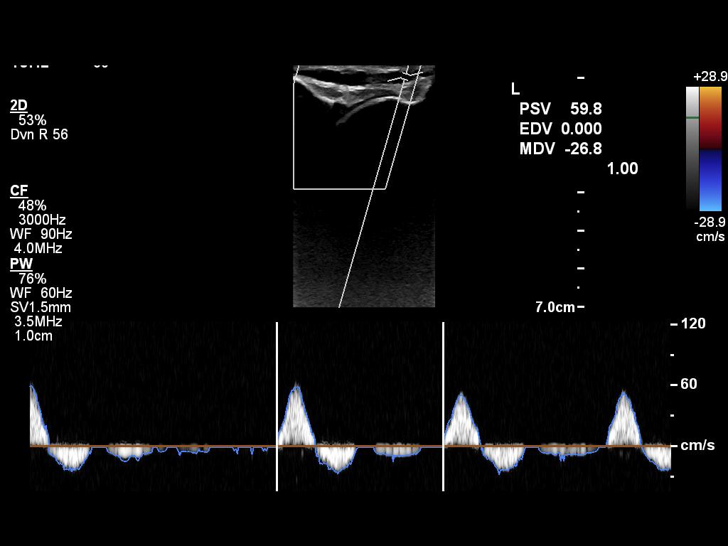

[14 of 16 positions shown; findings below may reference images not displayed]

FINDINGS: Ultrasound interrogation of the bilateral common femoral arteries through the dorsalis pedis and distal posterior tibial arteries shows biphasic waveforms without elevated peak systolic velocities or significant velocity shifts. No poststenotic turbulence.
IMPRESSION: 1. No hemodynamically significant stenosis identified. Waveforms in the distal right peroneal artery are biphasic on today's exam.

## 2022-01-14 IMAGING — US US RENAL ARTERY
1 series · 13 of 25 positions shown · non-contrast
Comparison: None pertinent

FINAL REPORT:
INDICATION: Hypertensive chronic kidney disease with stage 1 through stage 4 chronic kidney disease, or unspecified chronic kidney disease
Exam: Renal arterial duplex

[Series 1: us renal artery · 13 of 29 slices shown]
[im 1/29]
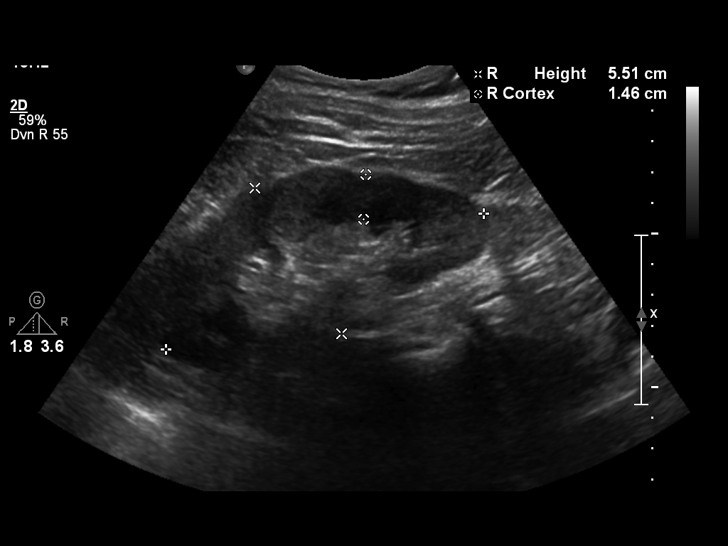
[im 3/29]
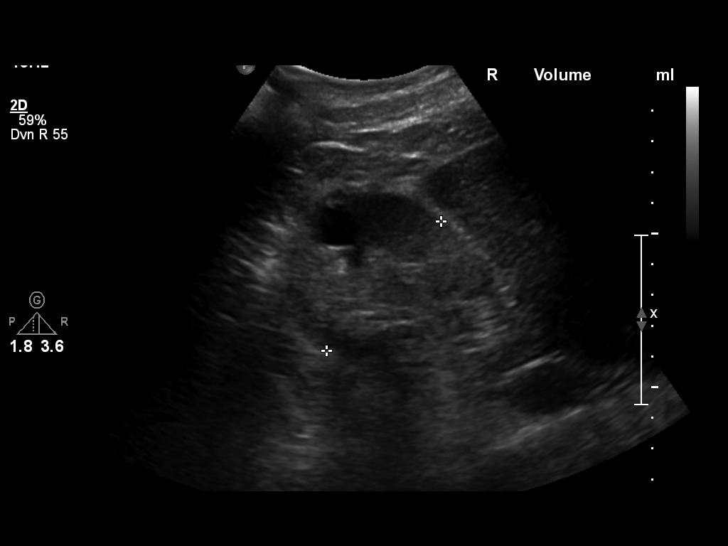
[im 5/29]
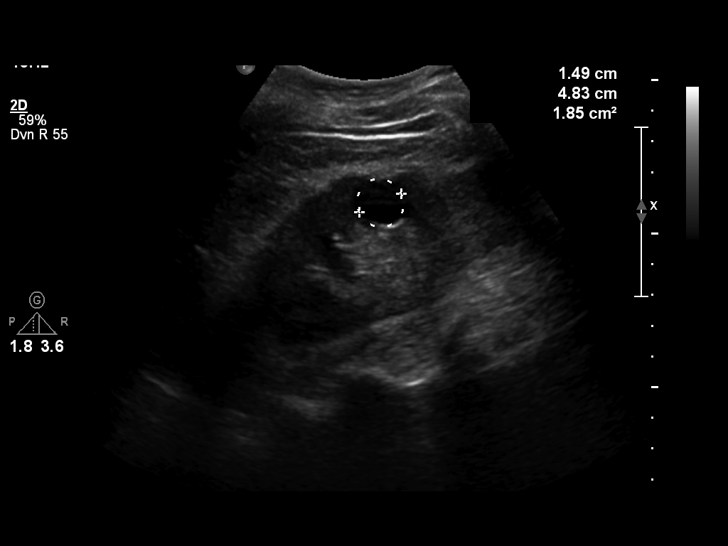
[im 8/29]
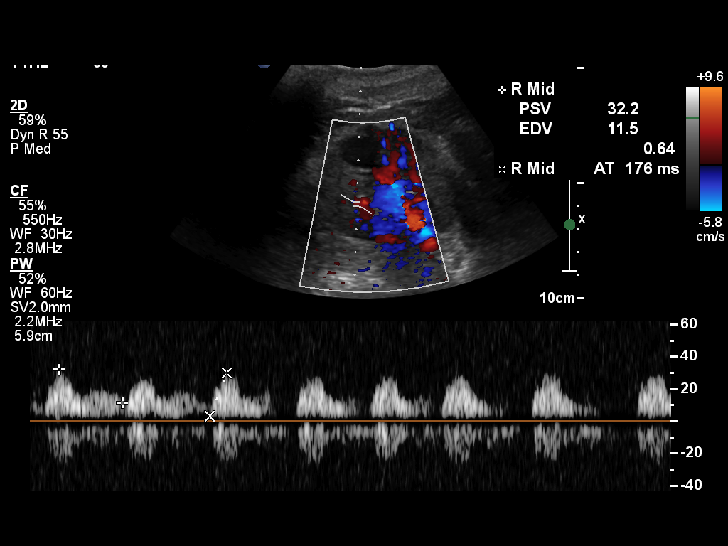
[im 10/29]
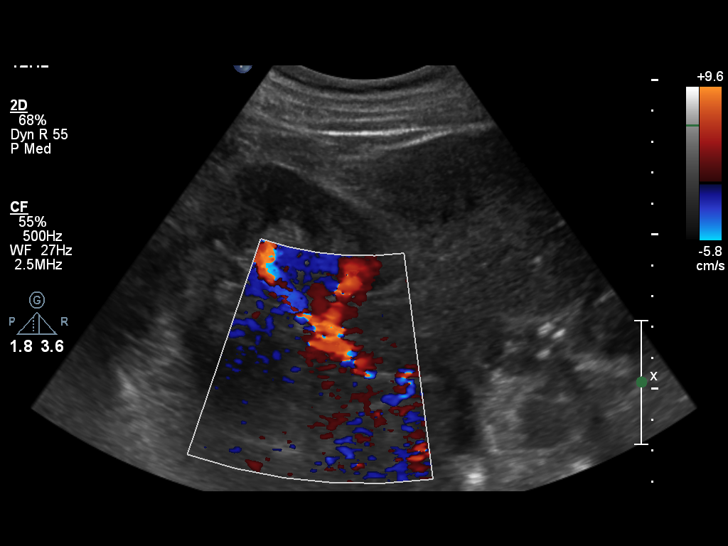
[im 12/29]
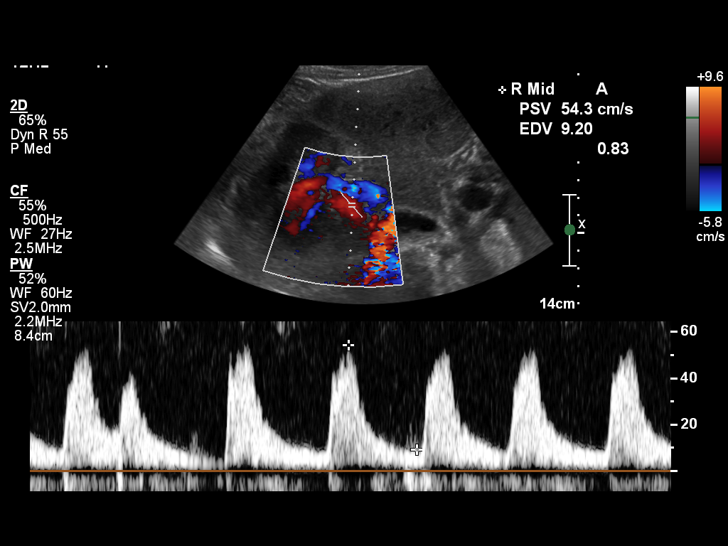
[im 15/29]
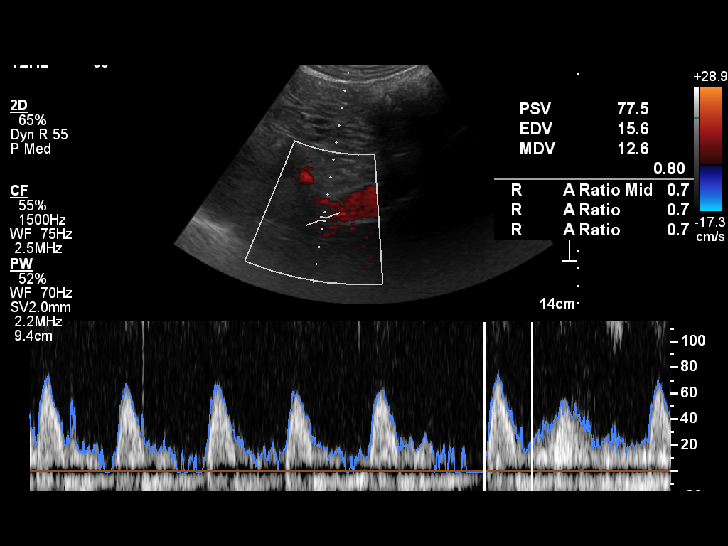
[im 17/29]
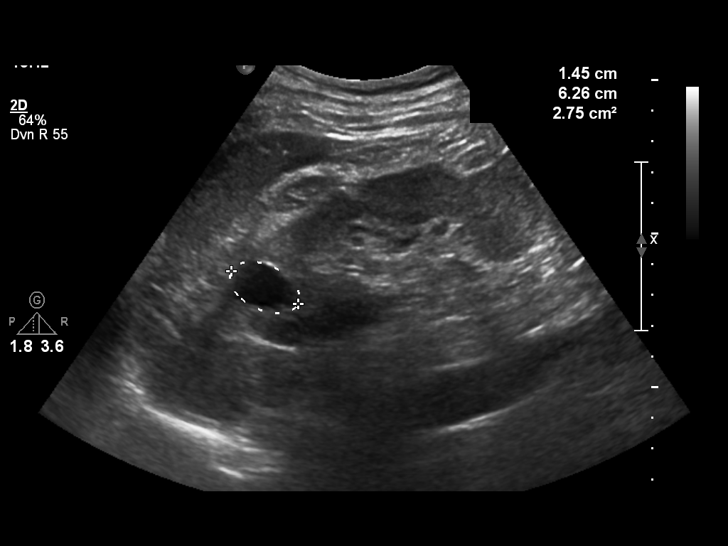
[im 19/29]
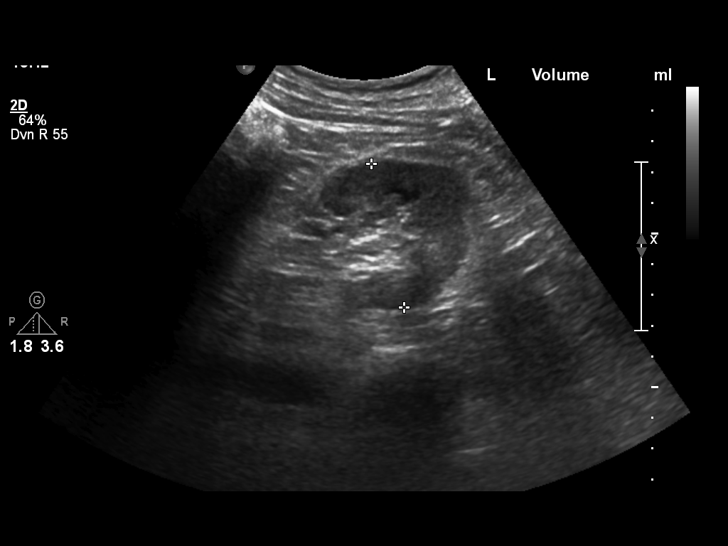
[im 22/29]
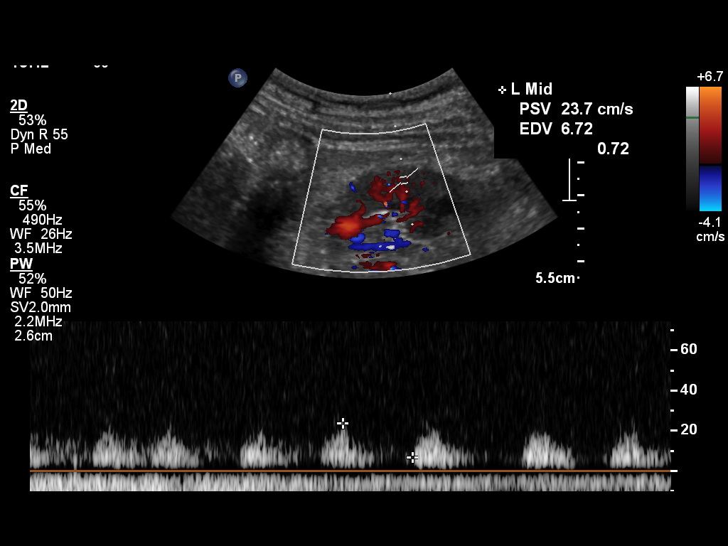
[im 24/29]
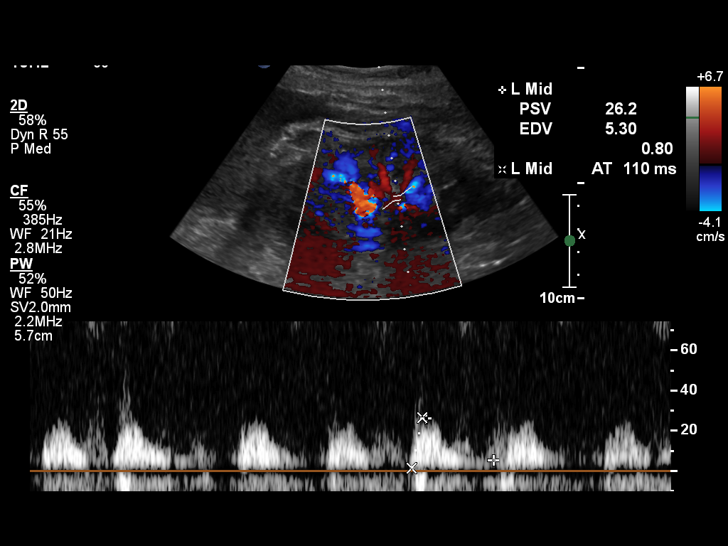
[im 26/29]
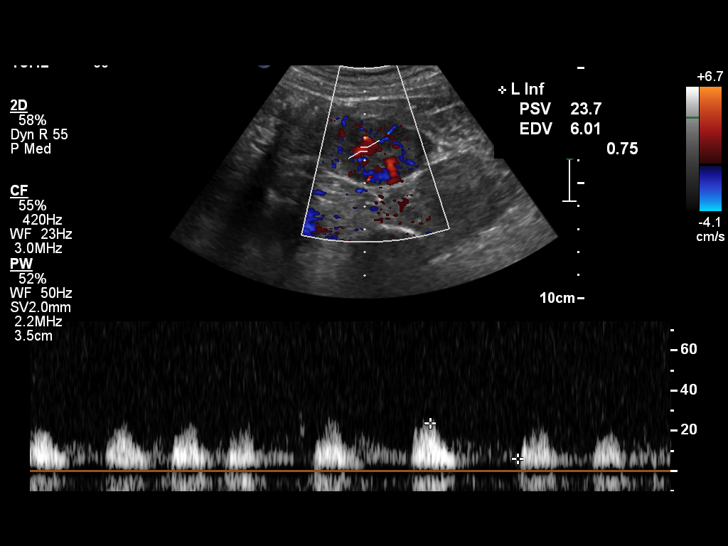
[im 29/29]
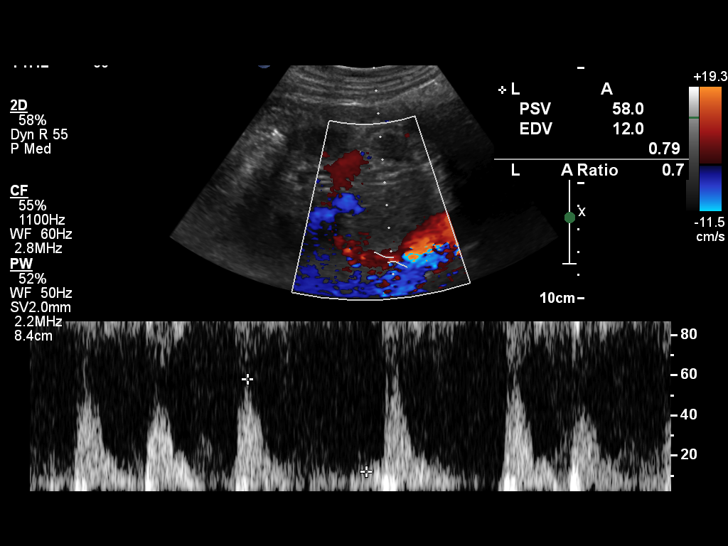

[13 of 25 positions shown; findings below may reference images not displayed]

FINDINGS: Peak systolic velocities midabdominal aorta at the level of the renal artery origins: cm/s
RIGHT
Echotexture: Hypoechoic relative to the hepatic parenchyma. Small simple appearing cysts.
Cortical thickness: Within normal limits
Hydronephrosis: None
Length: 11.2 cm
Renal vein: Patent
Renal artery: Peak systolic velocities measure up to 57 cm/s at the proximal renal artery with a renal aortic ratio of
Intralobar and segmental arteries: unremarkable
Resistive index renal parenchyma:
Left
Echotexture: Hypoechoic relative to the hepatic parenchyma.
Cortical thickness: Within normal limits
Hydronephrosis: None
Length: 11.6 cm
Renal vein: Patent
Renal artery: Peak systolic velocities measure up to 62 cm/s at the mid renal artery with a renal aortic ratio of
Intralobar and segmental arteries: unremarkable
Resistive index renal parenchyma:
IMPRESSION: 
IMPRESSION: 1. No ultrasound evidence of hemodynamically significant stenosis of the renal arteries
2. Elevated resistive indices right kidney.

## 2024-01-06 IMAGING — MR MRI PELVIS WITH AND WITHOUT CONTRAST
14 series · 48 of 48 positions shown · non-contrast
Comparison: None

FINAL REPORT:
PROCEDURE:  MRI PROSTATE
MRI OF THE PELVIS WITH AND WITHOUT CONTRAST
CLINICAL INDICATION: Elevated prostate specific antigen (PSA)
TECHNIQUE: Multisequence, multiplanar MRI of the pelvis was performed with and without intravenous contrast. Informed consent was obtained and there were no immediate complications. Multiparametric MRI of the prostate gland was performed, including axial, sagittal, and coronal T2, DWI, ADC maps, and DCE-MRI. 3D DynaCAD fusion postprocessing was utilized.

[Series 2001: T2 · sagittal · 3.0mm · 0.62mm/px · 1 of 30 slices shown (1 of 3)]
[im 1/30]
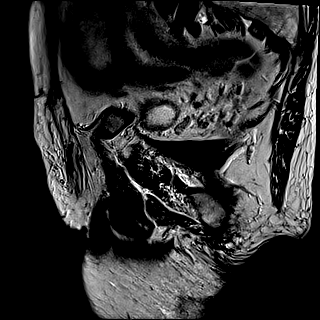

[Series 3001: T2 · coronal · 3.0mm · 0.62mm/px · 1 of 27 slices shown (2 of 3)]
[im 1/27]
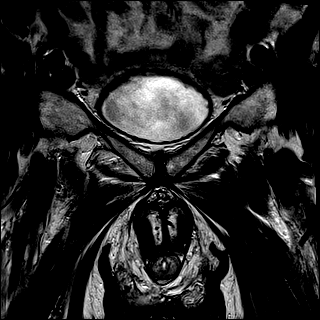

[Series 4001: T2 · axial · 3.0mm · 0.62mm/px · 1 of 34 slices shown (3 of 3)]
[im 1/34]
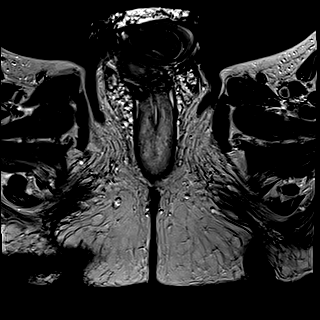

[Series 5001: T1 · axial · 3.0mm · 0.37mm/px · 1 of 30 slices shown (1 of 2)]
[im 1/30]
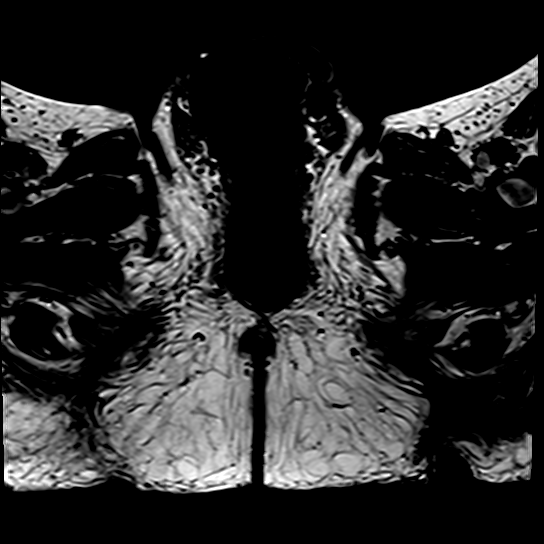

[Series 6001: DWI · axial · 3.5mm · 1.56mm/px · 1 of 25 slices shown (1 of 5)]
[im 1/25]
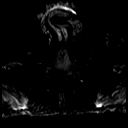

[Series 6002: DWI · axial · 3.5mm · 1.56mm/px · 1 of 25 slices shown (2 of 5)]
[im 1/25]
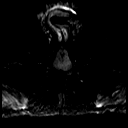

[Series 6003: DWI · axial · 3.5mm · 1.56mm/px · 1 of 25 slices shown (3 of 5)]
[im 1/25]
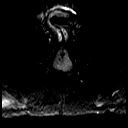

[Series 7001: DWI · axial · 3.5mm · 1.56mm/px · 1 of 25 slices shown (4 of 5)]
[im 1/25]
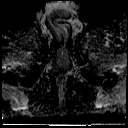

[Series 8001: DWI · axial · 3.5mm · 1.56mm/px · 1 of 25 slices shown (5 of 5)]
[im 1/25]
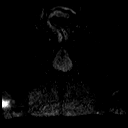

[Series 9001: T1 · axial · 5.0mm · 0.94mm/px · 1 of 35 slices shown (2 of 2)]
[im 1/35]
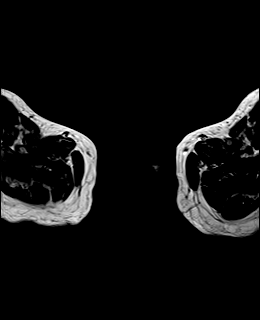

[t1_twist_(person_name)_dyn · axial · 3.0mm · 1.35mm/px · z∈[-95,-21]mm · 18 of 1820 slices shown]
[im 1/1820]
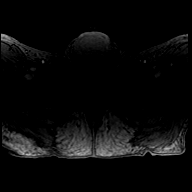
[im 108/1820]
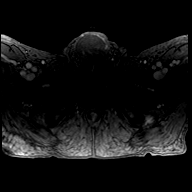
[im 215/1820]
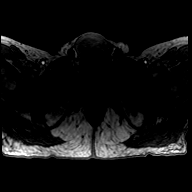
[im 322/1820]
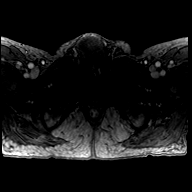
[im 429/1820]
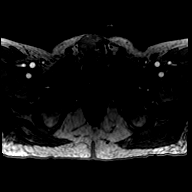
[im 536/1820]
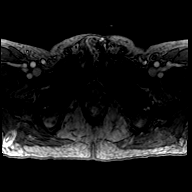
[im 643/1820]
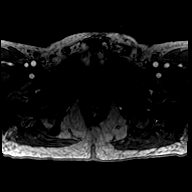
[im 750/1820]
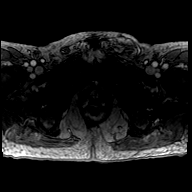
[im 857/1820]
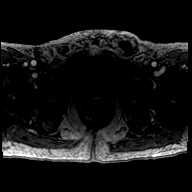
[im 964/1820]
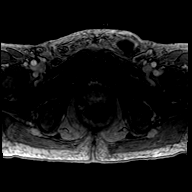
[im 1071/1820]
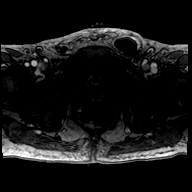
[im 1178/1820]
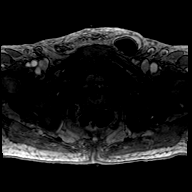
[im 1285/1820]
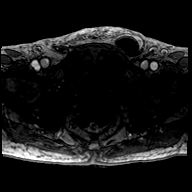
[im 1392/1820]
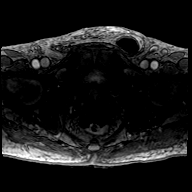
[im 1499/1820]
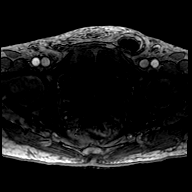
[im 1606/1820]
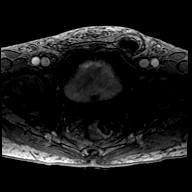
[im 1713/1820]
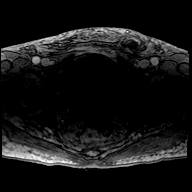
[im 1820/1820]
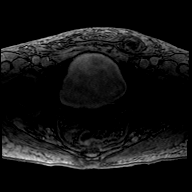

[t1_twist_(person_name)_dyn_sub · axial · 3.0mm · 1.35mm/px · z∈[-95,-21]mm · 18 of 1784 slices shown]
[im 1/1784]
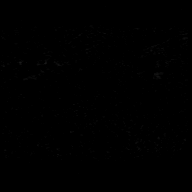
[im 105/1784]
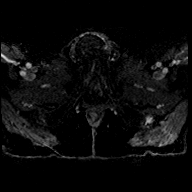
[im 210/1784]
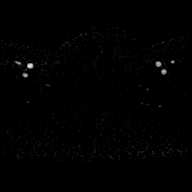
[im 315/1784]
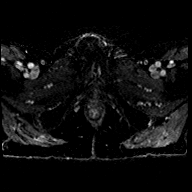
[im 420/1784]
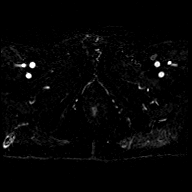
[im 525/1784]
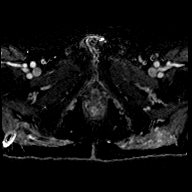
[im 630/1784]
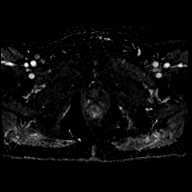
[im 735/1784]
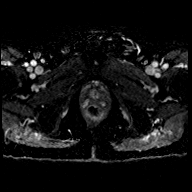
[im 840/1784]
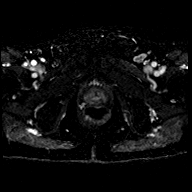
[im 944/1784]
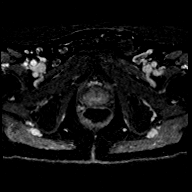
[im 1049/1784]
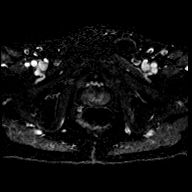
[im 1154/1784]
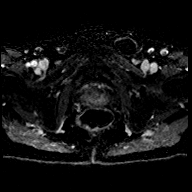
[im 1259/1784]
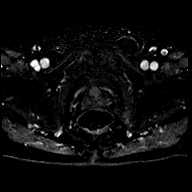
[im 1364/1784]
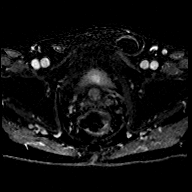
[im 1469/1784]
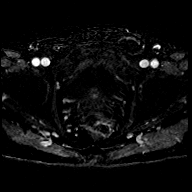
[im 1574/1784]
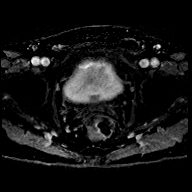
[im 1679/1784]
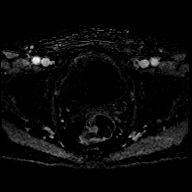
[im 1784/1784]
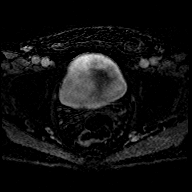

[T1 fat-sat post-contrast · axial · 5.0mm · 0.78mm/px · 1 of 33 slices shown (1 of 2)]
[im 1/33]
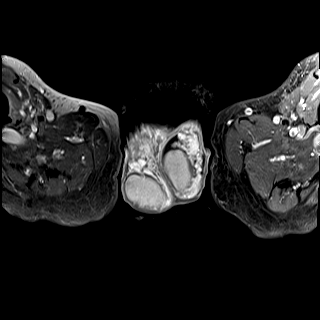

[T1 fat-sat post-contrast · sagittal · 3.0mm · 0.72mm/px · 1 of 30 slices shown (2 of 2)]
[im 1/30]
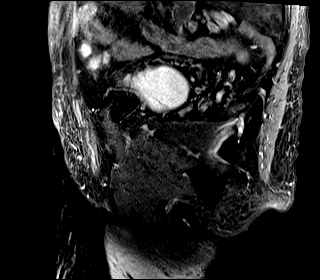

[48 of 48 positions shown; findings below may reference images not displayed]

FINDINGS: The prostate measures 4.0 x 3.1 x 4.4 cm. Volume: 28.6 mL.
Moderate changes of benign prostatic hyperplasia.
No abnormal T1 signal in the prostate to suggest blood products.
PI-RADS 3-5 lesions: Focal T2 hypointensity within the right peripheral zone mid gland at [DATE] with marked ADC, mild DWI and with early DCE measuring 0.5 x 0.4 cm ([DATE]).
Focal lenticular T2 hypointensity measuring 1.4 x 1.1 cm with marked ADC along the anterior transition zone mid gland to base from 11-1 o'clock ([DATE]).
Extracapsular tumor extension: No definite visualized.
Neurovascular bundles: Normal.
Seminal vesicles: Normal.
Lymph nodes: No pathologically enlarged lymphadenopathy.
Other pelvic organs: Small left inguinal hernia containing fluid and fat.
There are no destructive osseous lesions.
IMPRESSION: 1. Right peripheral zone mid gland PI-RADS 4 lesion.
2. Anterior transition zone mid gland to base PI-RADS 4 lesion.
3. No findings of extraprostatic extension, lymphadenopathy, or osseous metastasis in the pelvis.
4. Moderate changes of benign prostatic hyperplasia.

## 2024-02-15 IMAGING — CT PET^TMC_SKULL_THIGH (Adult)
1 of 7 series · 3 of 16 positions shown, 4 images · non-contrast
Comparison: Pelvic MRI 09/09/2024..
COMPARISON: Pelvic MRI 09/09/2024..
COMPARISON: Pelvic MRI 09/09/2024..

------------- REPORT [id number] -------------
FINAL REPORT:
PET/CT PSMA examination
INDICATION: Malignant neoplasm of prostate .
Procedure: This is initial examination. A fused PET/CT scan was performed from
the skull base  to the thighs after IV administration of 11.46 mCi of PMSA via a
left forearm IV. Axial images were acquired. Sagittal and coronal reconstructed
images were obtained. Patient was imaged 80 minutes post injection.

[Series 4: ct wb · axial · 0.98mm/px · z∈[-152,+332]mm · 3 of 484 slices shown, 4 images]
[im 121/484  soft-tissue]
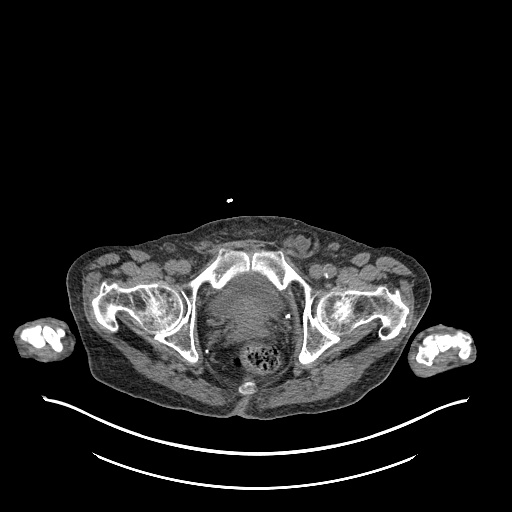
[im 121/484  bone]
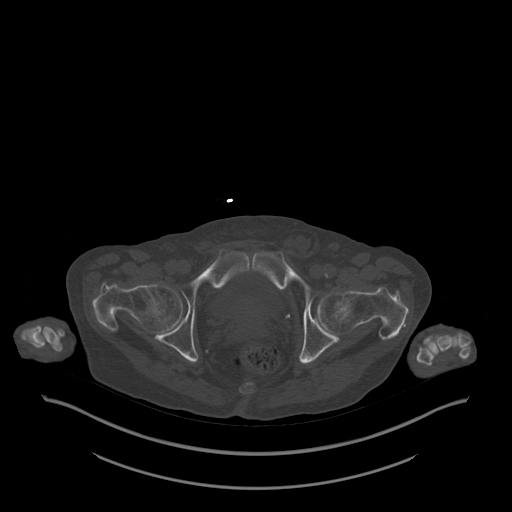
[im 242/484  soft-tissue]
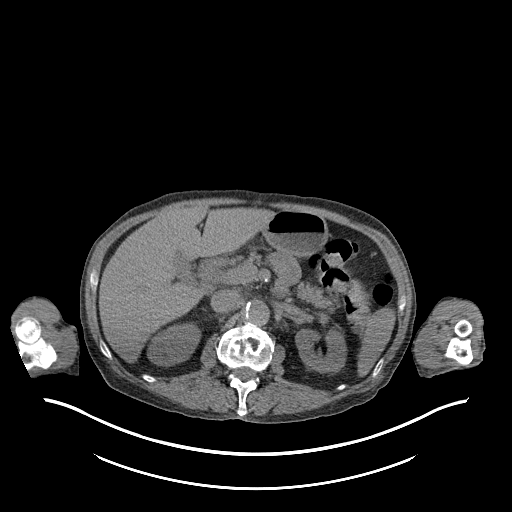
[im 363/484  soft-tissue]
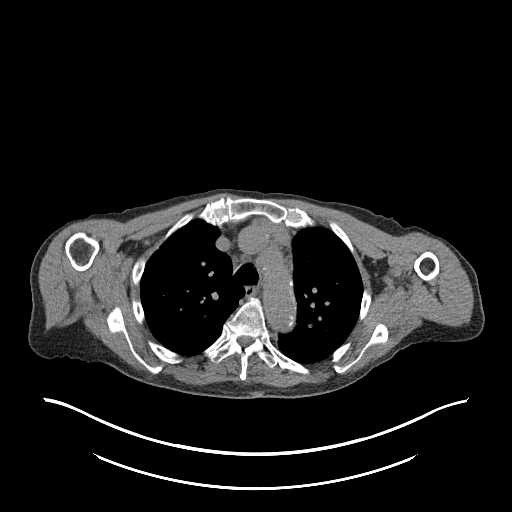

[3 of 16 positions shown; findings below may reference images not displayed]

FINDINGS: Background blood pool uptake: SUV max 1.4.
Background hepatic uptake: SUV max 5.7.
No abnormal tracer uptake within the prostate.
No tracer avid metastatic disease.
Additional findings:
*  Bilateral carotid bulb calcifications.
*  Heart is enlarged. Severe coronary calcifications. Main pulmonary artery is
dilated, which can be seen with pulmonary hypertension. Left atrial appendage
occlusion device.
*  2.8 cm cyst in the right lung.
*  Multifocal saccular aneurysm of the descending thoracic aorta which measures
up to 4.5 cm. Abdominal aortic aneurysm measuring up to 2.9 cm.
*  Right renal cyst.
*  Prostate is mildly enlarged.
*  Mild colonic diverticulosis.
*  Small left inguinal hernia containing fat and fluid. Recommend clinical
correlation.
*  Degenerative changes in the spine.
IMPRESSION: 
IMPRESSION: :
1.  No abnormal tracer uptake within the prostate.
2.  No tracer avid metastatic disease.
3.  Multifocal saccular aneurysms of the descending thoracic aorta which
measures up to 4.5 cm. Abdominal aortic aneurysm measuring up to 2.9 cm.
Consider prompt further characterization with CTA and vascular surgery consult.
[DATE]

------------- REPORT [id number] -------------
ADDENDUM:
Addendum:
(#[id number]
\F\[HOSPITAL]\F\
Communicated to: Ycnan, Gina Dla via [HOSPITAL]
On behalf of: Dr. Jeramie Padron
By: Reiko Kolb
At: [DATE]
On: 10/20/2024 EST
\F\/[HOSPITAL]\F\#)
[DATE]
FINAL REPORT:
PET/CT PSMA examination
FINDINGS: Background blood pool uptake: SUV max 1.4.
Background hepatic uptake: SUV max 5.7.
No abnormal tracer uptake within the prostate.
No tracer avid metastatic disease.
Additional findings:
*  Bilateral carotid bulb calcifications.
*  Heart is enlarged. Severe coronary calcifications. Main pulmonary artery is
dilated, which can be seen with pulmonary hypertension. Left atrial appendage
occlusion device.
*  2.8 cm cyst in the right lung.
*  Multifocal saccular aneurysm of the descending thoracic aorta which measures
up to 4.5 cm. Abdominal aortic aneurysm measuring up to 2.9 cm.
*  Right renal cyst.
*  Prostate is mildly enlarged.
*  Mild colonic diverticulosis.
*  Small left inguinal hernia containing fat and fluid. Recommend clinical
correlation.
*  Degenerative changes in the spine.
IMPRESSION: 
IMPRESSION: :
1.  No abnormal tracer uptake within the prostate.
2.  No tracer avid metastatic disease.
3.  Multifocal saccular aneurysms of the descending thoracic aorta which
measures up to 4.5 cm. Abdominal aortic aneurysm measuring up to 2.9 cm.
Consider prompt further characterization with CTA and vascular surgery consult.
[DATE]

------------- REPORT [id number] -------------
FINAL REPORT:
PET/CT PSMA examination
FINDINGS: Background blood pool uptake: SUV max 1.4.
Background hepatic uptake: SUV max 5.7.
No abnormal tracer uptake within the prostate.
No tracer avid metastatic disease.
Additional findings:
*  Bilateral carotid bulb calcifications.
*  Heart is enlarged. Severe coronary calcifications. Main pulmonary artery is
dilated, which can be seen with pulmonary hypertension. Left atrial appendage
occlusion device.
*  2.8 cm cyst in the right lung.
*  Multifocal saccular aneurysm of the descending thoracic aorta which measures
up to 4.5 cm. Abdominal aortic aneurysm measuring up to 2.9 cm.
*  Right renal cyst.
*  Prostate is mildly enlarged.
*  Mild colonic diverticulosis.
*  Small left inguinal hernia containing fat and fluid. Recommend clinical
correlation.
*  Degenerative changes in the spine.
IMPRESSION: 
IMPRESSION: :
1.  No abnormal tracer uptake within the prostate.
2.  No tracer avid metastatic disease.
3.  Multifocal saccular aneurysms of the descending thoracic aorta which
measures up to 4.5 cm. Abdominal aortic aneurysm measuring up to 2.9 cm.
Consider prompt further characterization with CTA and vascular surgery consult.
[DATE]

## 2024-03-02 IMAGING — CT CT CHEST ANGIO WITH AND WITHOUT IV CONTRAST
2 of 8 series · 11 of 31 positions shown · non-contrast
Comparison: none

------------- REPORT [id number] -------------
Recheck Aneurysm of descending thoracic aorta without rupture.
FINAL REPORT:
CT arteriogram of the chest IV with and without contrast
HISTORY: Aneurysm of the descending thoracic aorta, without rupture
TECHNIQUE: Thin-section images were acquired through the chest before and after the administration of IV contrast using arteriogram protocol. Informed consent was obtained prior to administration of IV contrast. Reconstructed MIP images were also obtained. All CT scans at this facility use iterative reconstruction technique, dose modulation and/or weight based dosing when appropriate to reduce radiation dose to as low as reasonably achievable.

[Series 3: dissection · axial · 0.70mm/px · z∈[-134,-128]mm · 2 of 140 slices shown]
[im 68/140  lung]
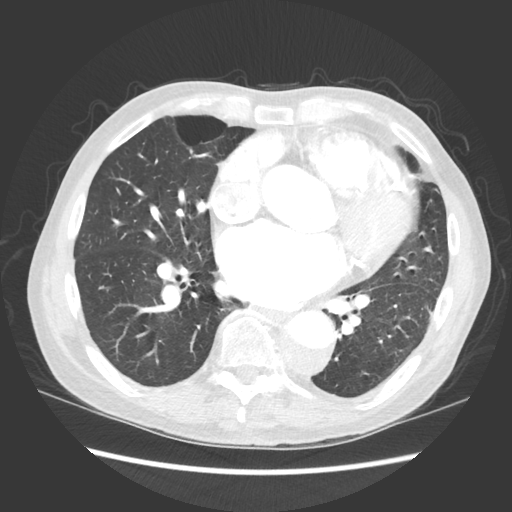
[im 70/140  lung]
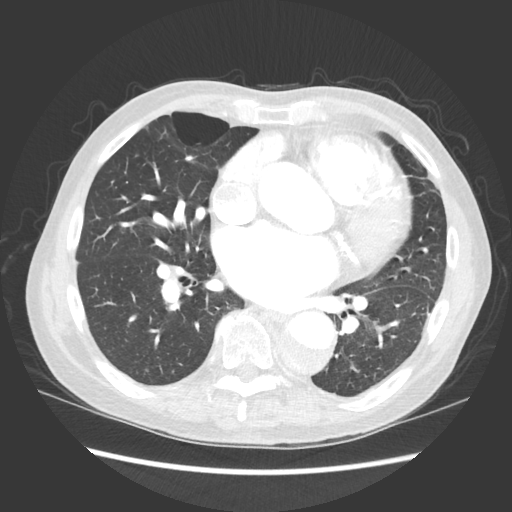

[Series 4: retro recon · axial · 0.70mm/px · z∈[-263,+8]mm · 9 of 559 slices shown]
[im 63/559  lung]
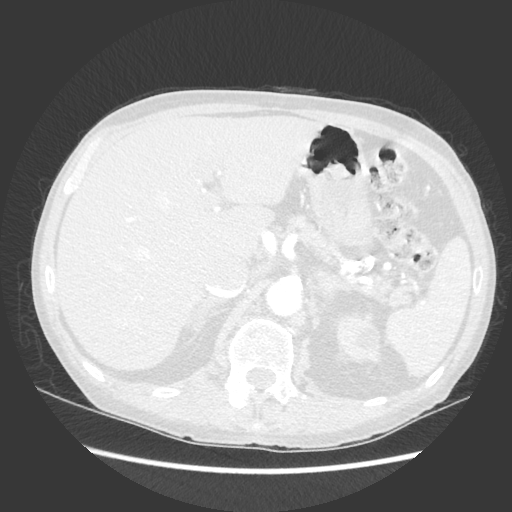
[im 125/559  mediastinal]
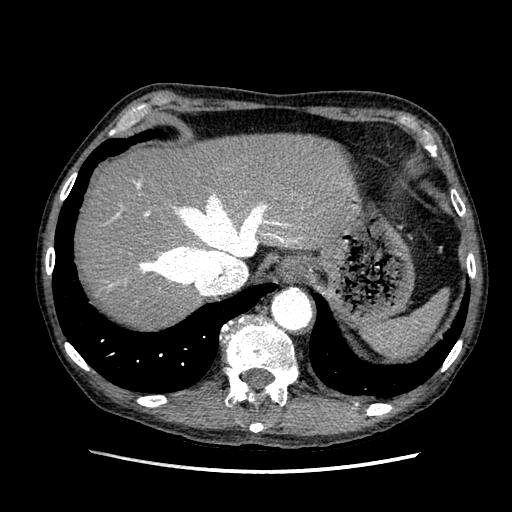
[im 187/559  lung]
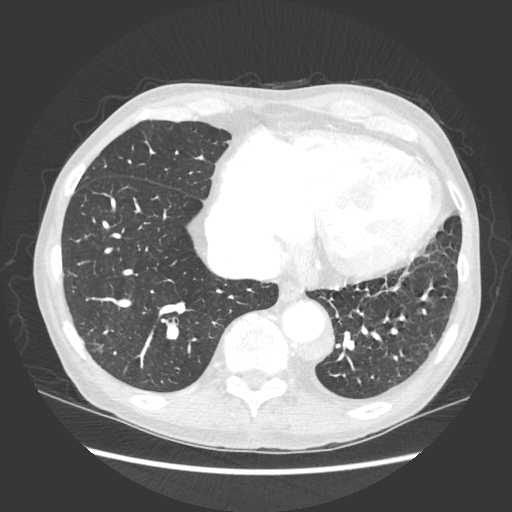
[im 249/559  mediastinal]
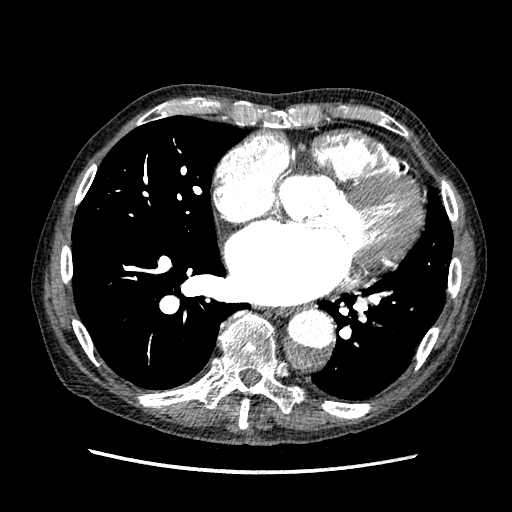
[im 269/559  lung]
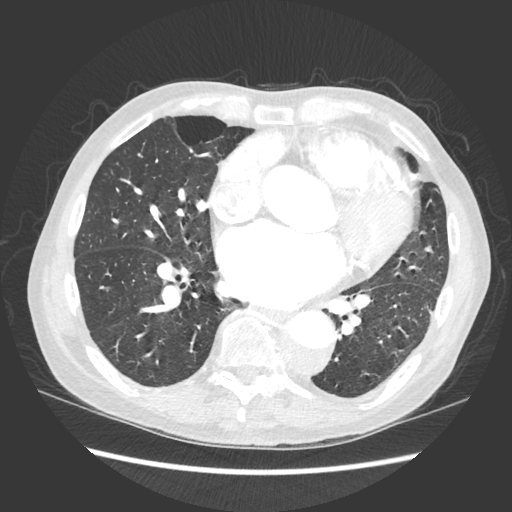
[im 311/559  mediastinal]
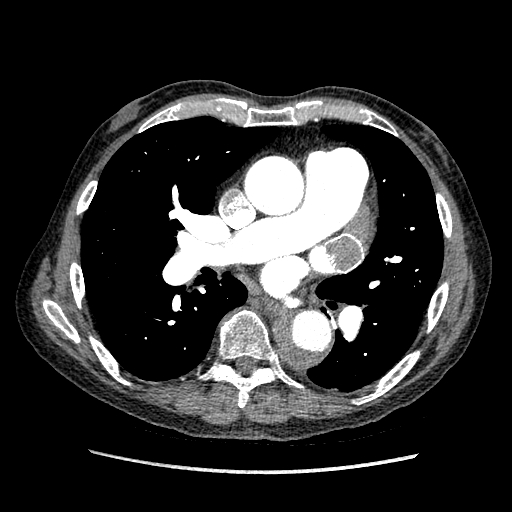
[im 373/559  lung]
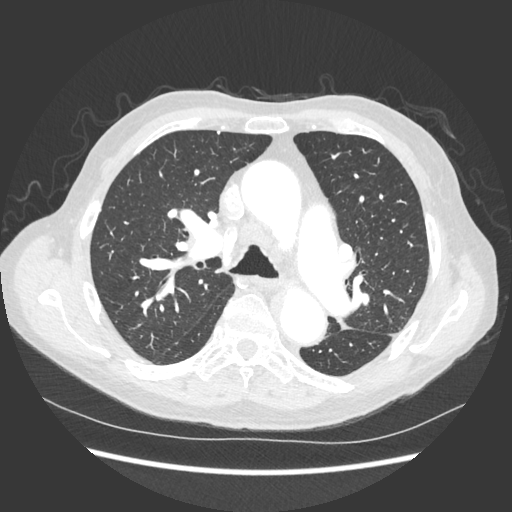
[im 435/559  mediastinal]
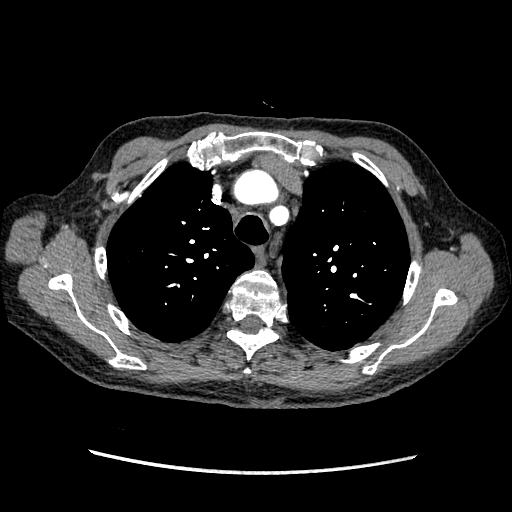
[im 497/559  lung]
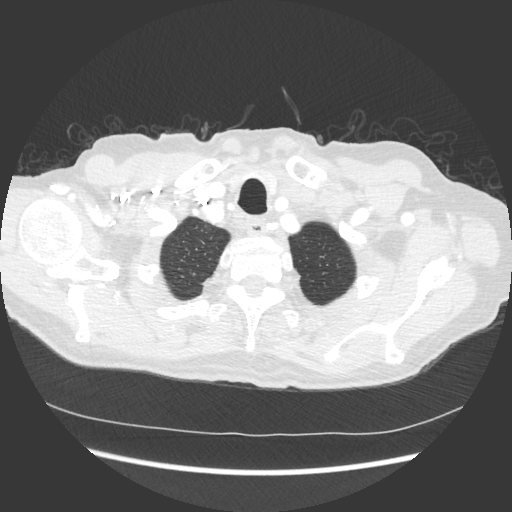

[11 of 31 positions shown; findings below may reference images not displayed]

FINDINGS: There is a fusiform aneurysm of the descending thoracic aorta beginning at the aortic arch and extending inferiorly to the gastroesophageal junction. There is nonenhancing mural thrombus within the aneurysm. Aneurysm measures 4.3 x 3.8 cm on image [DATE].
There is dilatation of the ascending thoracic aorta measuring up to 4.2 x 4.1 cm on image [DATE]. No filling defects in the pulmonary or lobar arteries.
Lungs clear with no pulmonary consolidation or mass. There is a small bulla in the right upper lobe. No pleural effusions or pneumothorax. No suspicious axillary, mediastinal, or hilar adenopathy. There is reflux of contrast into the IVC and hepatic veins. Imaged portions of the liver and spleen grossly unremarkable. No aggressive osseous lesions.
There are coronary arterial calcifications present
IMPRESSION: 1. Fusiform aneurysm of the descending thoracic aorta measuring up to 4.3 cm in diameter.
2. Dilatation of the ascending thoracic aorta measuring up to 4.2 cm in diameter.
3. Reflux of contrast into the IVC and hepatic veins suggestive of right heart dysfunction.
A thoracic aneurysm is considered to be >5 cm for the ascending aorta or >4 cm descending thoracic aorta. An ascending aorta measuring >4.2 cm is considered borderline aneurysmal and periodic surveillance can be considered.
Recommendations from [HOSPITAL]. 6226; 2: 162-172

------------- REPORT [id number] -------------
Recheck Aneurysm of descending thoracic aorta without rupture.
FINAL REPORT:
CT arteriogram of the chest IV with and without contrast
FINDINGS: There is a fusiform aneurysm of the descending thoracic aorta beginning at the aortic arch and extending inferiorly to the gastroesophageal junction. There is nonenhancing mural thrombus within the aneurysm. Aneurysm measures 4.3 x 3.8 cm on image [DATE].
There is dilatation of the ascending thoracic aorta measuring up to 4.2 x 4.1 cm on image [DATE]. No filling defects in the pulmonary or lobar arteries.
Lungs clear with no pulmonary consolidation or mass. There is a small bulla in the right upper lobe. No pleural effusions or pneumothorax. No suspicious axillary, mediastinal, or hilar adenopathy. There is reflux of contrast into the IVC and hepatic veins. Imaged portions of the liver and spleen grossly unremarkable. No aggressive osseous lesions.
There are coronary arterial calcifications present
IMPRESSION: 1. Fusiform aneurysm of the descending thoracic aorta measuring up to 4.3 cm in diameter.
2. Dilatation of the ascending thoracic aorta measuring up to 4.2 cm in diameter.
3. Reflux of contrast into the IVC and hepatic veins suggestive of right heart dysfunction.
A thoracic aneurysm is considered to be >5 cm for the ascending aorta or >4 cm descending thoracic aorta. An ascending aorta measuring >4.2 cm is considered borderline aneurysmal and periodic surveillance can be considered.
Recommendations from [HOSPITAL]. 6226; 2: 162-172
